# Patient Record
Sex: Female | Born: 1935
Health system: Southern US, Community
[De-identification: ages and names within clinical notes are randomized; demographics above are authoritative.]

## PROBLEM LIST (undated history)

## (undated) DIAGNOSIS — H3530 Unspecified macular degeneration: Secondary | ICD-10-CM

## (undated) DIAGNOSIS — Z8489 Family history of other specified conditions: Secondary | ICD-10-CM

## (undated) DIAGNOSIS — Z9109 Other allergy status, other than to drugs and biological substances: Secondary | ICD-10-CM

## (undated) DIAGNOSIS — C4490 Unspecified malignant neoplasm of skin, unspecified: Secondary | ICD-10-CM

## (undated) DIAGNOSIS — R32 Unspecified urinary incontinence: Secondary | ICD-10-CM

## (undated) DIAGNOSIS — I2510 Atherosclerotic heart disease of native coronary artery without angina pectoris: Secondary | ICD-10-CM

## (undated) DIAGNOSIS — H269 Unspecified cataract: Secondary | ICD-10-CM

## (undated) DIAGNOSIS — Z6741 Type O blood, Rh negative: Secondary | ICD-10-CM

## (undated) DIAGNOSIS — R011 Cardiac murmur, unspecified: Secondary | ICD-10-CM

## (undated) DIAGNOSIS — I10 Essential (primary) hypertension: Secondary | ICD-10-CM

## (undated) DIAGNOSIS — Z8619 Personal history of other infectious and parasitic diseases: Secondary | ICD-10-CM

## (undated) DIAGNOSIS — K649 Unspecified hemorrhoids: Secondary | ICD-10-CM

## (undated) DIAGNOSIS — K219 Gastro-esophageal reflux disease without esophagitis: Secondary | ICD-10-CM

## (undated) DIAGNOSIS — M1990 Unspecified osteoarthritis, unspecified site: Secondary | ICD-10-CM

## (undated) HISTORY — PX: CHOLECYSTECTOMY, LAPAROSCOPIC: SHX56

## (undated) HISTORY — PX: SKIN CANCER EXCISION: SHX779

## (undated) HISTORY — PX: TONSILLECTOMY: SUR1361

## (undated) HISTORY — PX: ABDOMINAL HYSTERECTOMY: SHX81

## (undated) HISTORY — PX: PARS PLANA VITRECTOMY W/ REPAIR OF MACULAR HOLE: SHX2170

## (undated) HISTORY — PX: MOHS SURGERY: SUR867

---
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
## 2005-02-26 ENCOUNTER — Ambulatory Visit: Payer: Self-pay | Admitting: Unknown Physician Specialty

## 2006-03-12 ENCOUNTER — Ambulatory Visit: Payer: Self-pay | Admitting: Unknown Physician Specialty

## 2007-04-19 ENCOUNTER — Ambulatory Visit: Payer: Self-pay | Admitting: Unknown Physician Specialty

## 2007-06-16 HISTORY — PX: OTHER SURGICAL HISTORY: SHX169

## 2007-06-16 HISTORY — PX: EYE SURGERY: SHX253

## 2007-07-26 ENCOUNTER — Ambulatory Visit: Payer: Self-pay | Admitting: Gastroenterology

## 2008-05-23 ENCOUNTER — Ambulatory Visit: Payer: Self-pay | Admitting: Family Medicine

## 2008-06-15 HISTORY — PX: EYE SURGERY: SHX253

## 2009-04-15 ENCOUNTER — Ambulatory Visit (HOSPITAL_COMMUNITY): Admission: RE | Admit: 2009-04-15 | Discharge: 2009-04-15 | Payer: Self-pay | Admitting: Ophthalmology

## 2009-06-15 HISTORY — PX: EYE SURGERY: SHX253

## 2009-08-08 ENCOUNTER — Ambulatory Visit: Payer: Self-pay | Admitting: Family Medicine

## 2010-09-17 LAB — COMPREHENSIVE METABOLIC PANEL
ALT: 23 U/L (ref 0–35)
AST: 22 U/L (ref 0–37)
Albumin: 4.5 g/dL (ref 3.5–5.2)
Alkaline Phosphatase: 58 U/L (ref 39–117)
BUN: 16 mg/dL (ref 6–23)
CO2: 26 mEq/L (ref 19–32)
Calcium: 9.6 mg/dL (ref 8.4–10.5)
Chloride: 102 mEq/L (ref 96–112)
Creatinine, Ser: 0.85 mg/dL (ref 0.4–1.2)
GFR calc Af Amer: >60 mL/min (ref >60)
GFR calc non Af Amer: >60 mL/min (ref >60)
Glucose, Bld: 100 mg/dL — ABNORMAL HIGH (ref 70–99)
Potassium: 3.4 mEq/L — ABNORMAL LOW (ref 3.5–5.1)
Sodium: 138 mEq/L (ref 135–145)
Total Bilirubin: 0.4 mg/dL (ref 0.3–1.2)
Total Protein: 8.2 g/dL (ref 6.0–8.3)

## 2010-09-17 LAB — URINALYSIS, ROUTINE W REFLEX MICROSCOPIC
Bilirubin Urine: NEGATIVE
Glucose, UA: NEGATIVE mg/dL
Hgb urine dipstick: NEGATIVE
Ketones, ur: NEGATIVE mg/dL
Nitrite: NEGATIVE
Protein, ur: NEGATIVE mg/dL
Specific Gravity, Urine: 1.015 (ref 1.005–1.030)
Urobilinogen, UA: 0.2 mg/dL (ref 0.0–1.0)
pH: 7.5 (ref 5.0–8.0)

## 2010-09-17 LAB — CBC
HCT: 43 % (ref 36.0–46.0)
Hemoglobin: 14.8 g/dL (ref 12.0–15.0)
MCHC: 34.5 g/dL (ref 30.0–36.0)
MCV: 90.8 fL (ref 78.0–100.0)
Platelets: 332 10*3/uL (ref 150–400)
RBC: 4.73 MIL/uL (ref 3.87–5.11)
RDW: 13.9 % (ref 11.5–15.5)
WBC: 8.6 10*3/uL (ref 4.0–10.5)

## 2010-09-17 LAB — AUTOLOGOUS SERUM PATCH PREP

## 2010-09-18 ENCOUNTER — Ambulatory Visit: Payer: Self-pay | Admitting: Family Medicine

## 2010-11-24 LAB — HM COLONOSCOPY

## 2011-08-14 HISTORY — PX: MOHS SURGERY: SUR867

## 2011-11-18 LAB — HM PAP SMEAR: HM Pap smear: NEGATIVE

## 2011-12-01 ENCOUNTER — Ambulatory Visit: Payer: Self-pay | Admitting: Family Medicine

## 2012-03-24 ENCOUNTER — Ambulatory Visit: Payer: Self-pay | Admitting: Sports Medicine

## 2012-05-18 ENCOUNTER — Ambulatory Visit: Payer: Self-pay | Admitting: Unknown Physician Specialty

## 2012-06-01 HISTORY — PX: JOINT REPLACEMENT: SHX530

## 2013-01-03 ENCOUNTER — Ambulatory Visit: Payer: Self-pay | Admitting: Family Medicine

## 2013-01-17 ENCOUNTER — Ambulatory Visit: Payer: Self-pay | Admitting: Family Medicine

## 2014-01-16 LAB — HM DEXA SCAN

## 2014-01-17 LAB — HM MAMMOGRAPHY

## 2014-02-23 ENCOUNTER — Ambulatory Visit: Payer: Self-pay | Admitting: Family Medicine

## 2014-07-10 ENCOUNTER — Ambulatory Visit: Payer: Self-pay | Admitting: Family Medicine

## 2014-10-24 LAB — BASIC METABOLIC PANEL
BUN: 16 mg/dL (ref 4–21)
Creatinine: 0.9 mg/dL (ref 0.5–1.1)
Glucose: 90 mg/dL
Potassium: 4.5 mmol/L (ref 3.4–5.3)
Sodium: 141 mmol/L (ref 137–147)

## 2014-10-24 LAB — LIPID PANEL
Cholesterol: 157 mg/dL (ref 0–200)
HDL: 73 mg/dL — AB (ref 35–70)
LDL Cholesterol: 56 mg/dL
LDl/HDL Ratio: 0.8
Triglycerides: 139 mg/dL (ref 40–160)

## 2014-10-24 LAB — CBC AND DIFFERENTIAL
HCT: 38 % (ref 36–46)
Hemoglobin: 12.7 g/dL (ref 12.0–16.0)
Neutrophils Absolute: 5 /uL
Platelets: 367 10*3/uL (ref 150–399)
WBC: 8 10^3/mL

## 2014-10-24 LAB — HEPATIC FUNCTION PANEL
ALT: 14 U/L (ref 7–35)
AST: 15 U/L (ref 13–35)
Alkaline Phosphatase: 65 U/L (ref 25–125)
Bilirubin, Total: 0.4 mg/dL

## 2014-10-24 LAB — TSH: TSH: 2.89 u[IU]/mL (ref 0.41–5.90)

## 2014-11-15 ENCOUNTER — Telehealth: Payer: Self-pay | Admitting: Family Medicine

## 2014-11-15 NOTE — Telephone Encounter (Signed)
Spoke with patient and [facility], patient received a letter from insurance stating Vesicare needs a letter of necessity. We did not get a copy and pharmacy could not tell me exactly what is needed. Pt has been on this medication for years no other medications have been tried. Patient will call the insurance and get them to send [place] a note about this and what is needed.

## 2014-11-15 NOTE — Telephone Encounter (Signed)
Pt stated she received a letter from the insurance about Vesicare and wanted to speak with someone about our office responding back to the insurance. Thanks TNP

## 2014-11-15 NOTE — Telephone Encounter (Signed)
[id number] 

## 2014-11-20 ENCOUNTER — Telehealth: Payer: Self-pay

## 2014-11-20 DIAGNOSIS — N3281 Overactive bladder: Secondary | ICD-10-CM

## 2014-11-20 MED ORDER — OXYBUTYNIN CHLORIDE 5 MG PO TABS: 5.0000 mg | ORAL_TABLET | Freq: Three times a day (TID) | ORAL | Status: DC | PRN

## 2014-11-20 NOTE — Telephone Encounter (Signed)
Due to insurance switching from vesicare to Oxybutinin. Patient advised and she will start new medication after finishing Vesicare. Has a CPE appt in August. Patient advised to let us know if she is having trouble with the medication. Letter from insurance scanned in the chart.

## 2014-11-21 ENCOUNTER — Telehealth: Payer: Self-pay | Admitting: Family Medicine

## 2014-11-21 NOTE — Telephone Encounter (Signed)
Brook at [facility] advised that yes patient is going to try Oxybutinin due to insurance. May need to go back to Vesicare if sx are not controlled by other medications.

## 2014-11-21 NOTE — Telephone Encounter (Signed)
Brook with [facility] called to verify pt is to start Oxybutynin and stop Vesicare?  CB#[phone]

## 2014-12-18 ENCOUNTER — Ambulatory Visit (INDEPENDENT_AMBULATORY_CARE_PROVIDER_SITE_OTHER): Payer: BLUE CROSS/BLUE SHIELD | Admitting: Family Medicine

## 2014-12-18 ENCOUNTER — Ambulatory Visit
Admission: RE | Admit: 2014-12-18 | Discharge: 2014-12-18 | Disposition: A | Discharge status: Home / Self Care | Payer: BLUE CROSS/BLUE SHIELD | Source: Ambulatory Visit | Attending: Family Medicine | Admitting: Family Medicine

## 2014-12-18 ENCOUNTER — Encounter: Payer: Self-pay | Admitting: Emergency Medicine

## 2014-12-18 ENCOUNTER — Encounter: Payer: Self-pay | Admitting: Family Medicine

## 2014-12-18 VITALS — BP 134/70 | HR 64 | Temp 99.1°F | Resp 16 | Ht 63.5 in

## 2014-12-18 DIAGNOSIS — E785 Hyperlipidemia, unspecified: Secondary | ICD-10-CM | POA: Insufficient documentation

## 2014-12-18 DIAGNOSIS — I350 Nonrheumatic aortic (valve) stenosis: Secondary | ICD-10-CM | POA: Insufficient documentation

## 2014-12-18 DIAGNOSIS — R059 Cough, unspecified: Secondary | ICD-10-CM

## 2014-12-18 DIAGNOSIS — N3281 Overactive bladder: Secondary | ICD-10-CM | POA: Insufficient documentation

## 2014-12-18 DIAGNOSIS — J309 Allergic rhinitis, unspecified: Secondary | ICD-10-CM | POA: Insufficient documentation

## 2014-12-18 DIAGNOSIS — R05 Cough: Secondary | ICD-10-CM

## 2014-12-18 DIAGNOSIS — R011 Cardiac murmur, unspecified: Secondary | ICD-10-CM | POA: Insufficient documentation

## 2014-12-18 DIAGNOSIS — L570 Actinic keratosis: Secondary | ICD-10-CM | POA: Insufficient documentation

## 2014-12-18 DIAGNOSIS — N318 Other neuromuscular dysfunction of bladder: Secondary | ICD-10-CM | POA: Insufficient documentation

## 2014-12-18 DIAGNOSIS — I8390 Asymptomatic varicose veins of unspecified lower extremity: Secondary | ICD-10-CM | POA: Insufficient documentation

## 2014-12-18 DIAGNOSIS — I10 Essential (primary) hypertension: Secondary | ICD-10-CM | POA: Insufficient documentation

## 2014-12-18 DIAGNOSIS — C4491 Basal cell carcinoma of skin, unspecified: Secondary | ICD-10-CM | POA: Insufficient documentation

## 2014-12-18 DIAGNOSIS — M25569 Pain in unspecified knee: Secondary | ICD-10-CM | POA: Insufficient documentation

## 2014-12-18 DIAGNOSIS — K219 Gastro-esophageal reflux disease without esophagitis: Secondary | ICD-10-CM | POA: Insufficient documentation

## 2014-12-18 DIAGNOSIS — M9979 Connective tissue and disc stenosis of intervertebral foramina of abdomen and other regions: Secondary | ICD-10-CM | POA: Insufficient documentation

## 2014-12-18 DIAGNOSIS — I38 Endocarditis, valve unspecified: Secondary | ICD-10-CM | POA: Insufficient documentation

## 2014-12-18 DIAGNOSIS — M1990 Unspecified osteoarthritis, unspecified site: Secondary | ICD-10-CM | POA: Insufficient documentation

## 2014-12-18 MED ORDER — AZITHROMYCIN 250 MG PO TABS: ORAL_TABLET | ORAL | Status: DC

## 2014-12-18 MED ORDER — SOLIFENACIN SUCCINATE 5 MG PO TABS: 5.0000 mg | ORAL_TABLET | Freq: Every day | ORAL | Status: DC

## 2014-12-18 NOTE — Progress Notes (Signed)
Patient ID: Teresa Moyer, female   DOB: 1936/06/14, 79 y.o.   MRN: [id number]   Teresa Moyer  MRN: [id number] DOB: 04-Sep-1935  Subjective:  Cough This is a recurrent problem. The current episode started 1 to 4 weeks ago. The problem has been waxing and waning. The problem occurs constantly. The cough is non-productive. Associated symptoms include a fever, nasal congestion, postnasal drip and a sore throat. Pertinent negatives include no chills, ear pain, headaches, shortness of breath or wheezing. Nothing aggravates the symptoms. She has tried OTC cough suppressant for the symptoms. The treatment provided mild relief. Her past medical history is significant for environmental allergies.  Patient reports that she has had cough for about 3 weeks. She reports that she has tried OTC delsym which has helped a little. Patient reports that she has also had a low grade fever. She mentions that she takes a daily allergy tablet (Zyrtec) to control allergy symptoms. However, it doesn't seem to be helping over the past few weeks.   Regarding her OAB, the oxybutynin does not work nearly as well as the [employer]. It is less effective, the patient leaks a lot of urine, and she has a significant dry mouth.  Patient Active Problem List   Diagnosis Date Noted  . Actinic keratosis 12/18/2014  . Allergic rhinitis 12/18/2014  . Arthritis 12/18/2014  . Basal cell carcinoma of skin 12/18/2014  . Gonalgia 12/18/2014  . Narrowing of intervertebral disc space 12/18/2014  . Essential (primary) hypertension 12/18/2014  . Gastro-esophageal reflux disease without esophagitis 12/18/2014  . HLD (hyperlipidemia) 12/18/2014  . Cardiac murmur 12/18/2014  . Arthritis, degenerative 12/18/2014  . Hypertonicity of bladder 12/18/2014  . Detrusor muscle hypertonia 12/18/2014  . Heart valve disease 12/18/2014  . Asymptomatic varicose veins 12/18/2014    History reviewed. No pertinent past medical history.  History    Social History  . Marital Status: Married    Spouse Name: N/A  . Number of Children: N/A  . Years of Education: N/A   Occupational History  . Not on file.   Social History Main Topics  . Smoking status: Never Smoker   . Smokeless tobacco: Never Used  . Alcohol Use: Not on file  . Drug Use: Not on file  . Sexual Activity: Not on file   Other Topics Concern  . Not on file   Social History Narrative    Outpatient Prescriptions Prior to Visit  Medication Sig Dispense Refill  . amLODipine (NORVASC) 5 MG tablet Take by mouth.    Marland Kitchen azelastine (ASTELIN) 0.1 % nasal spray Place into the nose.    . Calcium Carbonate 1500 (600 CA) MG TABS Take by mouth.    . cetirizine (ZYRTEC ALLERGY) 10 MG tablet Take by mouth.    . fenofibrate 160 MG tablet Take by mouth.    . fluticasone (FLONASE) 50 MCG/ACT nasal spray Place into the nose.    . furosemide (LASIX) 20 MG tablet Take by mouth.    . losartan (COZAAR) 100 MG tablet Take by mouth.    . meloxicam (MOBIC) 7.5 MG tablet Take by mouth.    . nebivolol (BYSTOLIC) 10 MG tablet Take by mouth.    . Omeprazole 20 MG TBEC Take by mouth.    . oxybutynin (DITROPAN) 5 MG tablet Take 1 tablet (5 mg total) by mouth 3 (three) times daily as needed for bladder spasms. 90 tablet 12  . Probiotic Product (ALIGN) 4 MG CAPS Take by mouth.    Marland Kitchen  traMADol (ULTRAM) 50 MG tablet Take by mouth.     No facility-administered medications prior to visit.    Allergies  Allergen Reactions  . Amoxicillin   . Accupril  [Quinapril Hcl] Hives and Swelling  . Codeine Sulfate   . Clinoril  [Sulindac]   . Hydrochlorothiazide   . Sulfa Antibiotics   . Tessalon  [Benzonatate]     Review of Systems  Constitutional: Positive for fever. Negative for chills.  HENT: Positive for postnasal drip and sore throat. Negative for ear pain.   Eyes: Negative.   Respiratory: Positive for cough. Negative for shortness of breath and wheezing.   Cardiovascular: Negative.    Gastrointestinal: Negative.   Genitourinary:       See history of present illness  Skin: Negative.   Neurological: Negative for headaches.  Endo/Heme/Allergies: Positive for environmental allergies.  Psychiatric/Behavioral: Negative.    Objective:  BP 134/70 mmHg  Pulse 64  Temp(Src) 99.1 F (37.3 C)  Resp 16  Ht 5' 3.5" (1.613 m)  Wt   SpO2 96%  Physical Exam  Constitutional: She is oriented to person, place, and time and well-developed, well-nourished, and in no distress.  HENT:  Head: Normocephalic and atraumatic.  Right Ear: External ear normal.  Left Ear: External ear normal.  Nose: Nose normal.  Mouth/Throat: Oropharynx is clear and moist.  Eyes: Conjunctivae and EOM are normal. Pupils are equal, round, and reactive to light.  Neck: Normal range of motion. Neck supple.  Cardiovascular: Normal rate, regular rhythm, normal heart sounds and intact distal pulses.   Pulmonary/Chest: Effort normal and breath sounds normal. No respiratory distress. She has no wheezes.  Abdominal: Soft. Bowel sounds are normal.  Neurological: She is alert and oriented to person, place, and time.  Skin: Skin is warm and dry.  Psychiatric: Memory, affect and judgment normal.  Vitals reviewed.   Assessment and Plan :  No diagnosis found. Cough/chronic bronchitis Obtain chest x-ray and a patient request will treat with Z-Pak.  OAB/overactive bladder DC oxybutynin and try Vesicare 5 mg daily. this should work for the patient. It worked well in the past. I have done the exam and reviewed the above chart and it is accurate to the best of my knowledge.  Miguel Aschoff MD [facility] 12/18/2014 3:19 PM

## 2015-01-16 ENCOUNTER — Encounter: Payer: Self-pay | Admitting: Family Medicine

## 2015-02-14 ENCOUNTER — Other Ambulatory Visit: Payer: Self-pay | Admitting: Family Medicine

## 2015-02-14 ENCOUNTER — Encounter: Payer: Self-pay | Admitting: Family Medicine

## 2015-02-14 ENCOUNTER — Ambulatory Visit (INDEPENDENT_AMBULATORY_CARE_PROVIDER_SITE_OTHER): Payer: BLUE CROSS/BLUE SHIELD | Admitting: Family Medicine

## 2015-02-14 VITALS — BP 142/70 | HR 64 | Temp 97.6°F | Resp 16 | Ht 63.0 in | Wt 150.0 lb

## 2015-02-14 DIAGNOSIS — Z0000 Encounter for general adult medical examination without abnormal findings: Secondary | ICD-10-CM

## 2015-02-14 DIAGNOSIS — Z1239 Encounter for other screening for malignant neoplasm of breast: Secondary | ICD-10-CM

## 2015-02-14 DIAGNOSIS — Z1231 Encounter for screening mammogram for malignant neoplasm of breast: Secondary | ICD-10-CM
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
## 2015-02-25 ENCOUNTER — Other Ambulatory Visit: Payer: Self-pay | Admitting: Family Medicine

## 2015-02-26 ENCOUNTER — Ambulatory Visit: Payer: BLUE CROSS/BLUE SHIELD

## 2015-02-28 ENCOUNTER — Ambulatory Visit
Admission: RE | Admit: 2015-02-28 | Discharge: 2015-02-28 | Disposition: A | Discharge status: Home / Self Care | Payer: BLUE CROSS/BLUE SHIELD | Source: Ambulatory Visit | Attending: Family Medicine | Admitting: Family Medicine

## 2015-02-28 DIAGNOSIS — Z1231 Encounter for screening mammogram for malignant neoplasm of breast: Secondary | ICD-10-CM | POA: Diagnosis not present

## 2015-03-22 ENCOUNTER — Other Ambulatory Visit: Payer: Self-pay | Admitting: Family Medicine

## 2015-03-25 ENCOUNTER — Other Ambulatory Visit: Payer: Self-pay | Admitting: Family Medicine

## 2015-04-12 ENCOUNTER — Other Ambulatory Visit: Payer: Self-pay | Admitting: Family Medicine

## 2015-05-27 ENCOUNTER — Other Ambulatory Visit: Payer: Self-pay | Admitting: Family Medicine

## 2015-08-02 DIAGNOSIS — Z85828 Personal history of other malignant neoplasm of skin: Secondary | ICD-10-CM | POA: Diagnosis not present

## 2015-08-02 DIAGNOSIS — D485 Neoplasm of uncertain behavior of skin: Secondary | ICD-10-CM | POA: Diagnosis not present

## 2015-08-02 DIAGNOSIS — L814 Other melanin hyperpigmentation: Secondary | ICD-10-CM | POA: Diagnosis not present

## 2015-08-02 DIAGNOSIS — D2272 Melanocytic nevi of left lower limb, including hip: Secondary | ICD-10-CM | POA: Diagnosis not present

## 2015-08-02 DIAGNOSIS — D2271 Melanocytic nevi of right lower limb, including hip: Secondary | ICD-10-CM | POA: Diagnosis not present

## 2015-08-02 DIAGNOSIS — X32XXXA Exposure to sunlight, initial encounter: Secondary | ICD-10-CM | POA: Diagnosis not present

## 2015-08-02 DIAGNOSIS — D225 Melanocytic nevi of trunk: Secondary | ICD-10-CM | POA: Diagnosis not present

## 2015-08-02 DIAGNOSIS — L570 Actinic keratosis: Secondary | ICD-10-CM | POA: Diagnosis not present

## 2015-08-07 DIAGNOSIS — I10 Essential (primary) hypertension: Secondary | ICD-10-CM | POA: Diagnosis not present

## 2015-08-07 DIAGNOSIS — I350 Nonrheumatic aortic (valve) stenosis: Secondary | ICD-10-CM | POA: Diagnosis not present

## 2015-08-07 DIAGNOSIS — R072 Precordial pain: Secondary | ICD-10-CM | POA: Diagnosis not present

## 2015-08-07 DIAGNOSIS — E782 Mixed hyperlipidemia: Secondary | ICD-10-CM | POA: Diagnosis not present

## 2015-08-14 ENCOUNTER — Ambulatory Visit (INDEPENDENT_AMBULATORY_CARE_PROVIDER_SITE_OTHER): Payer: Medicare Other | Admitting: Family Medicine

## 2015-08-14 ENCOUNTER — Encounter: Payer: Self-pay | Admitting: Family Medicine

## 2015-08-14 VITALS — BP 140/60 | HR 70 | Temp 98.3°F | Resp 16 | Wt 150.6 lb

## 2015-08-14 DIAGNOSIS — K625 Hemorrhage of anus and rectum: Secondary | ICD-10-CM

## 2015-08-14 DIAGNOSIS — K219 Gastro-esophageal reflux disease without esophagitis: Secondary | ICD-10-CM | POA: Diagnosis not present

## 2015-08-14 DIAGNOSIS — M159 Polyosteoarthritis, unspecified: Secondary | ICD-10-CM

## 2015-08-14 DIAGNOSIS — I10 Essential (primary) hypertension: Secondary | ICD-10-CM

## 2015-08-14 DIAGNOSIS — E785 Hyperlipidemia, unspecified: Secondary | ICD-10-CM

## 2015-08-14 MED ORDER — GLUCOSAMINE CHOND COMPLEX/MSM PO TABS: ORAL_TABLET | ORAL | Status: DC
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
## 2015-08-15 ENCOUNTER — Telehealth: Payer: Self-pay | Admitting: Family Medicine

## 2015-08-15 ENCOUNTER — Other Ambulatory Visit: Payer: Self-pay

## 2015-08-15 NOTE — Telephone Encounter (Signed)
Pt would like a nurse to return her call. Pt stated she was advised to stop taking meloxicam (MOBIC) 7.5 MG tablet and try an OTC medication for a month to see how it does. Pt stated that it will take the other medication 2 weeks to get in her system she would like to know if she can take Aleve or Tylenol. Thanks TNP

## 2015-08-15 NOTE — Telephone Encounter (Signed)
No. Tylenol only. Okay to try topical BenGay, icy hot, etc.

## 2015-08-15 NOTE — Telephone Encounter (Signed)
Please review-aa 

## 2015-08-15 NOTE — Telephone Encounter (Signed)
Pt advised-aa 

## 2015-08-15 NOTE — Telephone Encounter (Signed)
Per dr. Rosanna Randy he misunderstood earlier, Dr. Rosanna Randy said to take tylenol or advil is fine-aa

## 2015-08-16 DIAGNOSIS — K219 Gastro-esophageal reflux disease without esophagitis: Secondary | ICD-10-CM | POA: Diagnosis not present

## 2015-08-16 DIAGNOSIS — E785 Hyperlipidemia, unspecified: Secondary | ICD-10-CM | POA: Diagnosis not present

## 2015-08-16 DIAGNOSIS — I10 Essential (primary) hypertension: Secondary | ICD-10-CM | POA: Diagnosis not present

## 2015-08-17 LAB — COMPREHENSIVE METABOLIC PANEL
ALT: 13 IU/L (ref 0–32)
AST: 14 IU/L (ref 0–40)
Albumin/Globulin Ratio: 1.7 (ref 1.1–2.5)
Albumin: 4.3 g/dL (ref 3.5–4.8)
Alkaline Phosphatase: 63 IU/L (ref 39–117)
BUN/Creatinine Ratio: 21 (ref 11–26)
BUN: 18 mg/dL (ref 8–27)
Bilirubin Total: 0.4 mg/dL (ref 0.0–1.2)
CO2: 25 mmol/L (ref 18–29)
Calcium: 10.1 mg/dL (ref 8.7–10.3)
Chloride: 103 mmol/L (ref 96–106)
Creatinine, Ser: 0.85 mg/dL (ref 0.57–1.00)
GFR calc Af Amer: 76 mL/min/{1.73_m2} (ref >59)
GFR calc non Af Amer: 66 mL/min/{1.73_m2} (ref >59)
Globulin, Total: 2.6 g/dL (ref 1.5–4.5)
Glucose: 90 mg/dL (ref 65–99)
Potassium: 5 mmol/L (ref 3.5–5.2)
Sodium: 144 mmol/L (ref 134–144)
Total Protein: 6.9 g/dL (ref 6.0–8.5)

## 2015-08-17 LAB — CBC WITH DIFFERENTIAL/PLATELET
Basophils Absolute: 0.1 10*3/uL (ref 0.0–0.2)
Basos: 1 %
EOS (ABSOLUTE): 0.2 10*3/uL (ref 0.0–0.4)
Eos: 3 %
Hematocrit: 39.1 % (ref 34.0–46.6)
Hemoglobin: 13.1 g/dL (ref 11.1–15.9)
Immature Grans (Abs): 0 10*3/uL (ref 0.0–0.1)
Immature Granulocytes: 0 %
Lymphocytes Absolute: 1.9 10*3/uL (ref 0.7–3.1)
Lymphs: 27 %
MCH: 28.9 pg (ref 26.6–33.0)
MCHC: 33.5 g/dL (ref 31.5–35.7)
MCV: 86 fL (ref 79–97)
Monocytes Absolute: 0.9 10*3/uL (ref 0.1–0.9)
Monocytes: 13 %
Neutrophils Absolute: 3.9 10*3/uL (ref 1.4–7.0)
Neutrophils: 56 %
Platelets: 338 10*3/uL (ref 150–379)
RBC: 4.53 x10E6/uL (ref 3.77–5.28)
RDW: 14.1 % (ref 12.3–15.4)
WBC: 7 10*3/uL (ref 3.4–10.8)

## 2015-08-17 LAB — LIPID PANEL WITH LDL/HDL RATIO
Cholesterol, Total: 161 mg/dL (ref 100–199)
HDL: 71 mg/dL (ref >39)
LDL Calculated: 58 mg/dL (ref 0–99)
LDl/HDL Ratio: 0.8 ratio units (ref 0.0–3.2)
Triglycerides: 161 mg/dL — ABNORMAL HIGH (ref 0–149)
VLDL Cholesterol Cal: 32 mg/dL (ref 5–40)

## 2015-08-17 LAB — TSH: TSH: 3.27 u[IU]/mL (ref 0.450–4.500)

## 2015-08-19 ENCOUNTER — Other Ambulatory Visit: Payer: Self-pay | Admitting: Family Medicine

## 2015-08-19 DIAGNOSIS — Z86010 Personal history of colonic polyps: Secondary | ICD-10-CM | POA: Diagnosis not present

## 2015-08-29 ENCOUNTER — Encounter: Payer: Self-pay | Admitting: *Deleted

## 2015-08-30 ENCOUNTER — Encounter
Admission: RE | Disposition: A | Discharge status: Home / Self Care | Payer: Self-pay | Source: Ambulatory Visit | Attending: Gastroenterology

## 2015-08-30 ENCOUNTER — Ambulatory Visit
Admission: RE | Admit: 2015-08-30 | Discharge: 2015-08-30 | Disposition: A | Discharge status: Home / Self Care | Payer: Medicare Other | Source: Ambulatory Visit | Attending: Gastroenterology | Admitting: Gastroenterology

## 2015-08-30 ENCOUNTER — Ambulatory Visit: Payer: Medicare Other | Admitting: Anesthesiology

## 2015-08-30 ENCOUNTER — Encounter: Payer: Self-pay | Admitting: *Deleted

## 2015-08-30 DIAGNOSIS — Z880 Allergy status to penicillin: Secondary | ICD-10-CM | POA: Diagnosis not present

## 2015-08-30 DIAGNOSIS — K640 First degree hemorrhoids: Secondary | ICD-10-CM | POA: Diagnosis not present

## 2015-08-30 DIAGNOSIS — K621 Rectal polyp: Secondary | ICD-10-CM | POA: Insufficient documentation

## 2015-08-30 DIAGNOSIS — Z885 Allergy status to narcotic agent status: Secondary | ICD-10-CM | POA: Diagnosis not present

## 2015-08-30 DIAGNOSIS — Z1211 Encounter for screening for malignant neoplasm of colon: Secondary | ICD-10-CM | POA: Insufficient documentation

## 2015-08-30 DIAGNOSIS — D123 Benign neoplasm of transverse colon: Secondary | ICD-10-CM | POA: Insufficient documentation

## 2015-08-30 DIAGNOSIS — R011 Cardiac murmur, unspecified: Secondary | ICD-10-CM | POA: Insufficient documentation

## 2015-08-30 DIAGNOSIS — K219 Gastro-esophageal reflux disease without esophagitis: Secondary | ICD-10-CM | POA: Diagnosis not present

## 2015-08-30 DIAGNOSIS — M1990 Unspecified osteoarthritis, unspecified site: Secondary | ICD-10-CM | POA: Insufficient documentation

## 2015-08-30 DIAGNOSIS — D127 Benign neoplasm of rectosigmoid junction: Secondary | ICD-10-CM | POA: Diagnosis not present

## 2015-08-30 DIAGNOSIS — E782 Mixed hyperlipidemia: Secondary | ICD-10-CM | POA: Diagnosis not present

## 2015-08-30 DIAGNOSIS — Z96651 Presence of right artificial knee joint: Secondary | ICD-10-CM | POA: Insufficient documentation

## 2015-08-30 DIAGNOSIS — Z90710 Acquired absence of both cervix and uterus: Secondary | ICD-10-CM | POA: Diagnosis not present

## 2015-08-30 DIAGNOSIS — K649 Unspecified hemorrhoids: Secondary | ICD-10-CM | POA: Diagnosis not present

## 2015-08-30 DIAGNOSIS — Z85828 Personal history of other malignant neoplasm of skin: Secondary | ICD-10-CM | POA: Insufficient documentation

## 2015-08-30 DIAGNOSIS — Z79899 Other long term (current) drug therapy: Secondary | ICD-10-CM | POA: Insufficient documentation

## 2015-08-30 DIAGNOSIS — Z888 Allergy status to other drugs, medicaments and biological substances status: Secondary | ICD-10-CM | POA: Insufficient documentation

## 2015-08-30 DIAGNOSIS — Z882 Allergy status to sulfonamides status: Secondary | ICD-10-CM | POA: Insufficient documentation

## 2015-08-30 DIAGNOSIS — Z86010 Personal history of colonic polyps: Secondary | ICD-10-CM | POA: Diagnosis not present

## 2015-08-30 DIAGNOSIS — I10 Essential (primary) hypertension: Secondary | ICD-10-CM | POA: Insufficient documentation

## 2015-08-30 DIAGNOSIS — K635 Polyp of colon: Secondary | ICD-10-CM | POA: Diagnosis not present

## 2015-08-30 DIAGNOSIS — D122 Benign neoplasm of ascending colon: Secondary | ICD-10-CM | POA: Diagnosis not present

## 2015-08-30 HISTORY — DX: Personal history of other infectious and parasitic diseases: Z86.19

## 2015-08-30 HISTORY — PX: COLONOSCOPY WITH PROPOFOL: SHX5780

## 2015-08-30 HISTORY — DX: Unspecified cataract: H26.9

## 2015-08-30 HISTORY — DX: Unspecified hemorrhoids: K64.9

## 2015-08-30 HISTORY — DX: Unspecified urinary incontinence: R32

## 2015-08-30 HISTORY — DX: Unspecified malignant neoplasm of skin, unspecified: C44.90

## 2015-08-30 HISTORY — DX: Unspecified osteoarthritis, unspecified site: M19.90

## 2015-08-30 HISTORY — DX: Essential (primary) hypertension: I10

## 2015-08-30 HISTORY — DX: Other allergy status, other than to drugs and biological substances: Z91.09

## 2015-08-30 HISTORY — DX: Cardiac murmur, unspecified: R01.1

## 2015-08-30 LAB — HM COLONOSCOPY

## 2015-08-30 LAB — SURGICAL PATHOLOGY

## 2015-08-30 SURGERY — COLONOSCOPY WITH PROPOFOL
Anesthesia: General

## 2015-08-30 MED ORDER — SODIUM CHLORIDE 0.9 % IV SOLN
INTRAVENOUS | Status: DC
  Administered 2015-08-30: 1000 mL via INTRAVENOUS
  Administered 2015-08-30: 11:00:00 via INTRAVENOUS

## 2015-08-30 MED ORDER — PROPOFOL 500 MG/50ML IV EMUL
INTRAVENOUS | Status: DC | PRN
  Administered 2015-08-30: 50 ug/kg/min via INTRAVENOUS

## 2015-08-30 NOTE — Op Note (Signed)
[facility] Gastroenterology Patient Name: Teresa Moyer Procedure Date: 08/30/2015 11:00 AM MRN: [id number] Account #: [id number] Date of Birth: 01/10/1936 Admit Type: Outpatient Age: 80 Room: [facility] ENDO ROOM 2 Gender: Female Note Status: Finalized Procedure:            Colonoscopy Indications:          High risk colon cancer surveillance: Personal history                        of colonic polyps, Last colonoscopy: 2012 Patient Profile:      This is a 80 year old female. Providers:            Gerrit Heck. Rayann Heman, MD Referring MD:         Janine Ores. Rosanna Randy, MD (Referring MD) Medicines:            Propofol per Anesthesia Complications:        No immediate complications. Procedure:            Pre-Anesthesia Assessment:                       - Prior to the procedure, a History and Physical was                        performed, and patient medications, allergies and                        sensitivities were reviewed. The patient's tolerance of                        previous anesthesia was reviewed.                       After obtaining informed consent, the colonoscope was                        passed under direct vision. Throughout the procedure,                        the patient's blood pressure, pulse, and oxygen                        saturations were monitored continuously. The                        Colonoscope was introduced through the anus and                        advanced to the the cecum, identified by appendiceal                        orifice and ileocecal valve. The colonoscopy was                        performed without difficulty. The patient tolerated the                        procedure well. The quality of the bowel preparation                        was excellent. Findings:  The perianal and digital rectal examinations were normal.      A 3 mm polyp was found in the hepatic flexure. The polyp was sessile.       The polyp was removed with a  jumbo cold forceps. Resection and retrieval       were complete.      A 5 mm polyp was found in the recto-sigmoid colon. The polyp was       sessile. The polyp was removed with a cold snare. Resection and       retrieval were complete.      Internal hemorrhoids were found during retroflexion. The hemorrhoids       were Grade I (internal hemorrhoids that do not prolapse).      The exam was otherwise without abnormality. Impression:           - One 3 mm polyp at the hepatic flexure, removed with a                        jumbo cold forceps. Resected and retrieved.                       - One 5 mm polyp at the recto-sigmoid colon, removed                        with a cold snare. Resected and retrieved.                       - Internal hemorrhoids.                       - The examination was otherwise normal. Recommendation:       - Observe patient in GI recovery unit.                       - High fiber diet.                       - Continue present medications.                       - Await pathology results.                       - Return to referring physician.                       - Would not recommend further colon cancer screening                        based on age.                       - The findings and recommendations were discussed with                        the patient.                       - The findings and recommendations were discussed with                        the patient's family. Procedure Code(s):    --- Professional ---  45385, Colonoscopy, flexible; with removal of tumor(s),                        polyp(s), or other lesion(s) by snare technique                       45380, 59, Colonoscopy, flexible; with biopsy, single                        or multiple Diagnosis Code(s):    --- Professional ---                       Z86.010, Personal history of colonic polyps                       D12.3, Benign neoplasm of transverse colon (hepatic                         flexure or splenic flexure)                       D12.7, Benign neoplasm of rectosigmoid junction                       K64.0, First degree hemorrhoids CPT copyright 2016 American Medical Association. All rights reserved. The codes documented in this report are preliminary and upon coder review may  be revised to meet current compliance requirements. Mellody Life, MD 08/30/2015 11:29:24 AM This report has been signed electronically. Number of Addenda: 0 Note Initiated On: 08/30/2015 11:00 AM Scope Withdrawal Time: 0 hours 11 minutes 24 seconds  Total Procedure Duration: 0 hours 17 minutes 10 seconds       [facility]

## 2015-08-30 NOTE — Anesthesia Postprocedure Evaluation (Signed)
Anesthesia Post Note  Patient: Teresa Moyer  Procedure(s) Performed: Procedure(s) (LRB): COLONOSCOPY WITH PROPOFOL (N/A)  Patient location during evaluation: Endoscopy Anesthesia Type: General Level of consciousness: awake and alert Pain management: pain level controlled Vital Signs Assessment: post-procedure vital signs reviewed and stable Respiratory status: spontaneous breathing, nonlabored ventilation, respiratory function stable and patient connected to nasal cannula oxygen Cardiovascular status: blood pressure returned to baseline and stable Postop Assessment: no signs of nausea or vomiting Anesthetic complications: no    Last Vitals:  Filed Vitals:   08/30/15 1150 08/30/15 1200  BP: 174/72 169/64  Pulse: 64 65  Temp:    Resp: 19 25    Last Pain: There were no vitals filed for this visit.               Precious Haws Erikka Follmer
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
## 2015-08-30 NOTE — Transfer of Care (Signed)
Immediate Anesthesia Transfer of Care Note  Patient: Teresa Moyer  Procedure(s) Performed: Procedure(s): COLONOSCOPY WITH PROPOFOL (N/A)  Patient Location: PACU  Anesthesia Type:General  Level of Consciousness: awake, alert  and oriented  Airway & Oxygen Therapy: Patient Spontanous Breathing and Patient connected to nasal cannula oxygen  Post-op Assessment: Report given to RN and Post -op Vital signs reviewed and stable  Post vital signs: Reviewed and stable  Last Vitals:  Filed Vitals:   08/30/15 1026 08/30/15 1132  BP: 179/62 142/61  Pulse: 65 56  Temp: 36.9 C 36.7 C  Resp: 16 18    Complications: No apparent anesthesia complications
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
## 2015-09-04 ENCOUNTER — Encounter: Payer: Self-pay | Admitting: Gastroenterology

## 2015-09-23 ENCOUNTER — Other Ambulatory Visit: Payer: Self-pay | Admitting: Family Medicine

## 2015-10-25 ENCOUNTER — Other Ambulatory Visit: Payer: Self-pay | Admitting: Family Medicine

## 2015-10-30 DIAGNOSIS — H6123 Impacted cerumen, bilateral: Secondary | ICD-10-CM | POA: Diagnosis not present

## 2015-10-30 DIAGNOSIS — J301 Allergic rhinitis due to pollen: Secondary | ICD-10-CM | POA: Diagnosis not present

## 2015-11-05 DIAGNOSIS — H35371 Puckering of macula, right eye: Secondary | ICD-10-CM | POA: Diagnosis not present

## 2015-11-05 DIAGNOSIS — Z961 Presence of intraocular lens: Secondary | ICD-10-CM | POA: Diagnosis not present

## 2015-11-28 ENCOUNTER — Telehealth: Payer: Self-pay | Admitting: Family Medicine

## 2015-11-28 NOTE — Telephone Encounter (Signed)
Pt has been taking Glucosamine/Chondrotin as directed for about 6 weeks. She even went to taking the Mobic once a week to take less of it. She reports that she has a lot she needs to get done this summer and does not have time to be hurting. Wants to know what you want her to do about taking the Mobic. Please advise.

## 2015-11-28 NOTE — Telephone Encounter (Signed)
Pt was taking Mobic for her arthritis.  Dr. Rosanna Randy ask her to not take as much so she has been taking other medications but nothing works like the [employer].  She wants to talk to someone about taking the mobic.again for her pain.  Please advise. [phone]  Thanks, Con Memos

## 2015-11-29 NOTE — Telephone Encounter (Signed)
Pt advised-aa 

## 2015-11-29 NOTE — Telephone Encounter (Signed)
Trial Mobic  every other day to see how she does with this.

## 2015-12-26 ENCOUNTER — Telehealth: Payer: Self-pay | Admitting: Family Medicine

## 2015-12-26 NOTE — Telephone Encounter (Signed)
Pt stated that she started taking 1000 mg fish oil b/c she heard that it helps with dry eyes. Pt wanted to make sure it was ok for her to take with her other medications. Please advise. Thanks TNP

## 2015-12-26 NOTE — Telephone Encounter (Signed)
Please review-aa 

## 2015-12-26 NOTE — Telephone Encounter (Signed)
Advised patient as below.  

## 2015-12-26 NOTE — Telephone Encounter (Signed)
yes

## 2016-01-06 DIAGNOSIS — H00029 Hordeolum internum unspecified eye, unspecified eyelid: Secondary | ICD-10-CM | POA: Diagnosis not present

## 2016-02-17 ENCOUNTER — Other Ambulatory Visit: Payer: Self-pay | Admitting: Family Medicine

## 2016-02-19 ENCOUNTER — Ambulatory Visit (INDEPENDENT_AMBULATORY_CARE_PROVIDER_SITE_OTHER): Payer: Medicare Other | Admitting: Family Medicine

## 2016-02-19 ENCOUNTER — Encounter: Payer: Self-pay | Admitting: Family Medicine

## 2016-02-19 VITALS — BP 144/62 | HR 66 | Temp 97.9°F | Resp 16 | Ht 64.0 in | Wt 151.0 lb

## 2016-02-19 DIAGNOSIS — Z23 Encounter for immunization: Secondary | ICD-10-CM | POA: Diagnosis not present

## 2016-02-19 DIAGNOSIS — Z0000 Encounter for general adult medical examination without abnormal findings: Secondary | ICD-10-CM
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
## 2016-03-12 ENCOUNTER — Other Ambulatory Visit: Payer: Self-pay | Admitting: Family Medicine

## 2016-03-12 DIAGNOSIS — Z1231 Encounter for screening mammogram for malignant neoplasm of breast: Secondary | ICD-10-CM

## 2016-03-19 ENCOUNTER — Ambulatory Visit
Admission: RE | Admit: 2016-03-19 | Discharge: 2016-03-19 | Disposition: A | Discharge status: Home / Self Care | Payer: Medicare Other | Source: Ambulatory Visit | Attending: Family Medicine | Admitting: Family Medicine

## 2016-03-19 DIAGNOSIS — Z1231 Encounter for screening mammogram for malignant neoplasm of breast: Secondary | ICD-10-CM | POA: Insufficient documentation

## 2016-03-30 ENCOUNTER — Other Ambulatory Visit: Payer: Self-pay | Admitting: Family Medicine

## 2016-03-31 DIAGNOSIS — Z08 Encounter for follow-up examination after completed treatment for malignant neoplasm: Secondary | ICD-10-CM | POA: Diagnosis not present

## 2016-03-31 DIAGNOSIS — C44712 Basal cell carcinoma of skin of right lower limb, including hip: Secondary | ICD-10-CM | POA: Diagnosis not present

## 2016-03-31 DIAGNOSIS — L814 Other melanin hyperpigmentation: Secondary | ICD-10-CM | POA: Diagnosis not present

## 2016-03-31 DIAGNOSIS — Z85828 Personal history of other malignant neoplasm of skin: Secondary | ICD-10-CM | POA: Diagnosis not present

## 2016-03-31 DIAGNOSIS — D485 Neoplasm of uncertain behavior of skin: Secondary | ICD-10-CM | POA: Diagnosis not present

## 2016-03-31 DIAGNOSIS — H5340 Unspecified visual field defects: Secondary | ICD-10-CM | POA: Diagnosis not present

## 2016-03-31 DIAGNOSIS — L820 Inflamed seborrheic keratosis: Secondary | ICD-10-CM | POA: Diagnosis not present

## 2016-03-31 DIAGNOSIS — L821 Other seborrheic keratosis: Secondary | ICD-10-CM | POA: Diagnosis not present

## 2016-03-31 DIAGNOSIS — L538 Other specified erythematous conditions: Secondary | ICD-10-CM | POA: Diagnosis not present

## 2016-04-06 ENCOUNTER — Other Ambulatory Visit: Payer: Self-pay | Admitting: Family Medicine

## 2016-04-06 NOTE — Telephone Encounter (Signed)
AWV 02/19/2016. Renaldo Fiddler, CMA

## 2016-04-17 ENCOUNTER — Other Ambulatory Visit: Payer: Self-pay | Admitting: Family Medicine

## 2016-04-30 ENCOUNTER — Other Ambulatory Visit: Payer: Self-pay

## 2016-04-30 MED ORDER — METOPROLOL TARTRATE 25 MG PO TABS: 12.5000 mg | ORAL_TABLET | Freq: Two times a day (BID) | ORAL | 12 refills | Status: DC

## 2016-05-13 DIAGNOSIS — C44712 Basal cell carcinoma of skin of right lower limb, including hip: Secondary | ICD-10-CM | POA: Diagnosis not present

## 2016-06-30 DIAGNOSIS — H16143 Punctate keratitis, bilateral: Secondary | ICD-10-CM | POA: Diagnosis not present

## 2016-07-08 ENCOUNTER — Other Ambulatory Visit: Payer: Self-pay | Admitting: Family Medicine

## 2016-08-04 DIAGNOSIS — I10 Essential (primary) hypertension: Secondary | ICD-10-CM | POA: Diagnosis not present

## 2016-08-04 DIAGNOSIS — E782 Mixed hyperlipidemia: Secondary | ICD-10-CM | POA: Diagnosis not present

## 2016-08-04 DIAGNOSIS — I350 Nonrheumatic aortic (valve) stenosis: Secondary | ICD-10-CM | POA: Diagnosis not present

## 2016-08-04 DIAGNOSIS — K219 Gastro-esophageal reflux disease without esophagitis: Secondary | ICD-10-CM | POA: Diagnosis not present

## 2016-08-25 ENCOUNTER — Other Ambulatory Visit: Payer: Self-pay | Admitting: Family Medicine

## 2016-08-25 ENCOUNTER — Ambulatory Visit (INDEPENDENT_AMBULATORY_CARE_PROVIDER_SITE_OTHER): Payer: Medicare Other | Admitting: Family Medicine

## 2016-08-25 ENCOUNTER — Encounter: Payer: Self-pay | Admitting: Family Medicine

## 2016-08-25 VITALS — BP 162/74 | HR 78 | Temp 97.8°F | Resp 14 | Wt 150.0 lb

## 2016-08-25 DIAGNOSIS — K219 Gastro-esophageal reflux disease without esophagitis: Secondary | ICD-10-CM | POA: Diagnosis not present

## 2016-08-25 DIAGNOSIS — M159 Polyosteoarthritis, unspecified: Secondary | ICD-10-CM

## 2016-08-25 DIAGNOSIS — M79662 Pain in left lower leg: Secondary | ICD-10-CM | POA: Diagnosis not present

## 2016-08-25 DIAGNOSIS — E785 Hyperlipidemia, unspecified: Secondary | ICD-10-CM | POA: Diagnosis not present

## 2016-08-25 DIAGNOSIS — M79661 Pain in right lower leg: Secondary | ICD-10-CM | POA: Diagnosis not present

## 2016-08-25 DIAGNOSIS — I10 Essential (primary) hypertension: Secondary | ICD-10-CM | POA: Diagnosis not present

## 2016-08-25 MED ORDER — RANITIDINE HCL 150 MG PO CAPS: 150.0000 mg | ORAL_CAPSULE | Freq: Two times a day (BID) | ORAL | 11 refills | Status: DC

## 2016-08-25 MED ORDER — MAGNESIUM OXIDE 400 MG PO CAPS: 400.0000 mg | ORAL_CAPSULE | Freq: Two times a day (BID) | ORAL | 11 refills | Status: DC

## 2016-08-25 NOTE — Telephone Encounter (Signed)
Pt contacted office for refill request on the following medications:  traMADol (ULTRAM) 50 MG tablet.  [facility].  CB#[phone]/MW

## 2016-08-25 NOTE — Progress Notes (Signed)
Subjective:  HPI  Hypertension, follow-up:  BP Readings from Last 3 Encounters:  08/25/16 (!) 162/74  02/19/16 (!) 144/62  08/30/15 (!) 169/64    She was last seen for hypertension 6 months ago.  BP at that visit was 144/62. Management since that visit includes cardiology increase metoprolol to 25 mg BID instead of 12.5 mg BID. Pt reports that this was just started on February 21st, so she doesn't know if this had time to get in her system or not. She reports good compliance with treatment. She is not having side effects.  She is exercising. She is adherent to low salt diet.   Outside blood pressures are running 130-140's/ 70's. She is experiencing none.  Patient denies chest pain, chest pressure/discomfort, claudication, dyspnea, exertional chest pressure/discomfort, fatigue, irregular heart beat, lower extremity edema, near-syncope, orthopnea, palpitations, paroxysmal nocturnal dyspnea, syncope and tachypnea.   Cardiovascular risk factors include advanced age (older than 48 for men, 57 for women) and hypertension.   Wt Readings from Last 3 Encounters:  08/25/16 150 lb (68 kg)  02/19/16 151 lb (68.5 kg)  08/30/15 150 lb (68 kg)   ------------------------------------------------------------------------ GERD- Pt was told by cardiologist to stop the Omeprazole and try Tagamet. Pt reports that she is only taking one pill but it does not seem to be working as well and she has a lot of excess gas with it.   Leg pain- Pt reports that she has been having pain and swelling in her calves, bilaterally. She says its at night. She has been eating a banana a day and has seen to help, but she also had an injury to her left knee about 4 weeks ago in her exercise class and she has been seeing the chiropractor for this. She is taking mobic, tylenol and tramadol for the pain. She would like a refill on her tramadol as well.   Prior to Admission medications   Medication Sig Start Date End Date  Taking? Authorizing Provider  amLODipine (NORVASC) 5 MG tablet TAKE 1 TABLET BY MOUTH ONCE DAILY. 02/18/16   Jerrol Banana., MD  azelastine (ASTELIN) 0.1 % nasal spray Place into the nose. 08/03/12   Historical Provider, MD  Calcium Carbonate 1500 (600 CA) MG TABS Take by mouth. 11/18/11   Historical Provider, MD  cetirizine (ZYRTEC ALLERGY) 10 MG tablet Take by mouth. 11/18/11   Historical Provider, MD  fenofibrate 160 MG tablet TAKE 1 TABLET BY MOUTH ONCE DAILY. 09/23/15   Richard Maceo Pro., MD  fluticasone (FLONASE) 50 MCG/ACT nasal spray Place into the nose. 11/18/11   Historical Provider, MD  furosemide (LASIX) 20 MG tablet TAKE 1 TABLET BY MOUTH ONCE DAILY. 10/26/15   Richard Maceo Pro., MD  losartan (COZAAR) 100 MG tablet TAKE 1 TABLET BY MOUTH ONCE DAILY. 04/17/16   Richard Maceo Pro., MD  meloxicam (MOBIC) 7.5 MG tablet Take 1 tablet (7.5 mg total) by mouth daily as needed. 07/08/16   Richard Maceo Pro., MD  metoprolol tartrate (LOPRESSOR) 25 MG tablet Take 0.5 tablets (12.5 mg total) by mouth 2 (two) times daily. 04/30/16   Richard Maceo Pro., MD  Misc Natural Products (GLUCOSAMINE CHOND COMPLEX/MSM) TABS 1 daily OTC 08/14/15   Jerrol Banana., MD  omeprazole (PRILOSEC) 20 MG capsule TAKE 1 CAPSULE BY MOUTH ONCE DAILY 08/19/15   Jerrol Banana., MD  Probiotic Product (ALIGN) 4 MG CAPS Take by mouth.    Historical Provider, MD  traMADol (ULTRAM) 50 MG tablet TAKE 1/2 OR 1 TABLET BY MOUTH EVERY 8 HOURS AS NEEDED. 03/25/15   Jerrol Banana., MD  VESICARE 5 MG tablet TAKE 1 TABLET BY MOUTH ONCE DAILY. 04/06/16   Richard Maceo Pro., MD    Patient Active Problem List   Diagnosis Date Noted  . Actinic keratosis 12/18/2014  . Allergic rhinitis 12/18/2014  . Arthritis 12/18/2014  . Basal cell carcinoma of skin 12/18/2014  . Gonalgia 12/18/2014  . Narrowing of intervertebral disc space 12/18/2014  . Essential (primary) hypertension 12/18/2014  . Gastro-esophageal  reflux disease without esophagitis 12/18/2014  . HLD (hyperlipidemia) 12/18/2014  . Cardiac murmur 12/18/2014  . Arthritis, degenerative 12/18/2014  . Hypertonicity of bladder 12/18/2014  . Detrusor muscle hypertonia 12/18/2014  . Heart valve disease 12/18/2014  . Asymptomatic varicose veins 12/18/2014    Past Medical History:  Diagnosis Date  . Cataracts, bilateral   . Environmental allergies   . Heart murmur   . Hemorrhoids   . History of chickenpox   . Hypertension   . Incontinence in female   . Osteoarthritis   . Skin cancer     Social History   Social History  . Marital status: Married    Spouse name: N/A  . Number of children: N/A  . Years of education: N/A   Occupational History  . Not on file.   Social History Main Topics  . Smoking status: Never Smoker  . Smokeless tobacco: Never Used  . Alcohol use No  . Drug use: No  . Sexual activity: Not on file   Other Topics Concern  . Not on file   Social History Narrative  . No narrative on file    Allergies  Allergen Reactions  . Accupril  [Quinapril Hcl] Hives and Swelling  . Amoxicillin   . Clinoril  [Sulindac]   . Codeine Sulfate   . Hydrochlorothiazide   . Penicillins   . Sulfa Antibiotics   . Tessalon  [Benzonatate]     Review of Systems  Constitutional: Negative.   HENT: Negative.   Eyes: Negative.   Respiratory: Negative.   Cardiovascular: Positive for leg swelling.  Gastrointestinal: Positive for heartburn.  Genitourinary: Negative.   Musculoskeletal: Positive for joint pain and myalgias.  Skin: Negative.   Neurological: Negative.   Endo/Heme/Allergies: Negative.   Psychiatric/Behavioral: Negative.     Immunization History  Administered Date(s) Administered  . Influenza, High Dose Seasonal PF 02/19/2016  . Influenza-Unspecified 03/16/2015  . Pneumococcal Conjugate-13 01/04/2014  . Pneumococcal Polysaccharide-23 11/22/2006  . Zoster 04/08/2010    Objective:  BP (!) 162/74 (BP  Location: Left Arm, Patient Position: Sitting, Cuff Size: Normal)   Pulse 78   Temp 97.8 F (36.6 C) (Oral)   Resp 14   Wt 150 lb (68 kg)   BMI 25.75 kg/m   Physical Exam  Cardiovascular:  Murmur (2/6 sysolic murmur at the right upper sternal boarder) heard.   Lab Results  Component Value Date   WBC 7.0 08/16/2015   HGB 12.7 10/24/2014   HCT 39.1 08/16/2015   PLT 338 08/16/2015   GLUCOSE 90 08/16/2015   CHOL 161 08/16/2015   TRIG 161 (H) 08/16/2015   HDL 71 08/16/2015   LDLCALC 58 08/16/2015   TSH 3.270 08/16/2015    CMP     Component Value Date/Time   NA 144 08/16/2015 0855   K 5.0 08/16/2015 0855   CL 103 08/16/2015 0855   CO2 25 08/16/2015 0855  GLUCOSE 90 08/16/2015 0855   GLUCOSE 100 (H) 04/15/2009 0858   BUN 18 08/16/2015 0855   CREATININE 0.85 08/16/2015 0855   CALCIUM 10.1 08/16/2015 0855   PROT 6.9 08/16/2015 0855   ALBUMIN 4.3 08/16/2015 0855   AST 14 08/16/2015 0855   ALT 13 08/16/2015 0855   ALKPHOS 63 08/16/2015 0855   BILITOT 0.4 08/16/2015 0855   GFRNONAA 66 08/16/2015 0855   GFRAA 76 08/16/2015 0855    Assessment and Plan :  1. Essential (primary) hypertension Not to goal. Cardiology increase Metoprolol to 25 mg BID. Follow for now. Continue to monitor at home and follow up in 2 months.  - CBC with Differential/Platelet - TSH  2. Gastro-esophageal reflux disease without esophagitis Worse since stopping Omeprazole and starting Tagement per cardiology. Start Ranitidine. Follow up 2 months.  - ranitidine (ZANTAC) 150 MG capsule; Take 1 capsule (150 mg total) by mouth 2 (two) times daily.  Dispense: 60 capsule; Refill: 11  3. Osteoarthritis of multiple joints, unspecified osteoarthritis type   4. Bilateral calf pain Start mag ox. If not better may try CoQ10. Follow up in 2 months.  - Magnesium Oxide 400 MG CAPS; Take 1 capsule (400 mg total) by mouth 2 (two) times daily.  Dispense: 60 capsule; Refill: 11  5. Hyperlipidemia, unspecified  hyperlipidemia type  - Lipid Panel With LDL/HDL Ratio - Comprehensive metabolic panel  HPI, Exam, and A&P Transcribed under the direction and in the presence of Richard L. Cranford Mon, MD  Electronically Signed: Katina Dung, CMA I have done the exam and reviewed the above chart and it is accurate to the best of my knowledge. Development worker, community has been used in this note in any air is in the dictation or transcription are unintentional.  [facility] 08/25/2016 2:20 PM

## 2016-08-25 NOTE — Telephone Encounter (Signed)
Saw you today. Last refill 03/25/2015. Renaldo Fiddler, CMA

## 2016-08-25 NOTE — Progress Notes (Deleted)
       Patient: Teresa Moyer Female    DOB: [birth year]   81 y.o.   MRN: [id number] Visit Date: 08/25/2016  Today's Provider: [name], MD   No chief complaint on file.  Subjective:    HPI     Allergies  Allergen Reactions  . Accupril  [Quinapril Hcl] Hives and Swelling  . Amoxicillin   . Clinoril  [Sulindac]   . Codeine Sulfate   . Hydrochlorothiazide   . Penicillins   . Sulfa Antibiotics   . Tessalon  [Benzonatate]      Current Outpatient Prescriptions:  .  amLODipine (NORVASC) 5 MG tablet, TAKE 1 TABLET BY MOUTH ONCE DAILY., Disp: 30 tablet, Rfl: 12 .  azelastine (ASTELIN) 0.1 % nasal spray, Place into the nose., Disp: , Rfl:  .  Calcium Carbonate 1500 (600 CA) MG TABS, Take by mouth., Disp: , Rfl:  .  cetirizine (ZYRTEC ALLERGY) 10 MG tablet, Take by mouth., Disp: , Rfl:  .  fenofibrate 160 MG tablet, TAKE 1 TABLET BY MOUTH ONCE DAILY., Disp: 30 tablet, Rfl: 12 .  fluticasone (FLONASE) 50 MCG/ACT nasal spray, Place into the nose., Disp: , Rfl:  .  furosemide (LASIX) 20 MG tablet, TAKE 1 TABLET BY MOUTH ONCE DAILY., Disp: 30 tablet, Rfl: 12 .  losartan (COZAAR) 100 MG tablet, TAKE 1 TABLET BY MOUTH ONCE DAILY., Disp: 90 tablet, Rfl: 3 .  meloxicam (MOBIC) 7.5 MG tablet, Take 1 tablet (7.5 mg total) by mouth daily as needed., Disp: 60 tablet, Rfl: 5 .  metoprolol tartrate (LOPRESSOR) 25 MG tablet, Take 0.5 tablets (12.5 mg total) by mouth 2 (two) times daily., Disp: 60 tablet, Rfl: 12 .  Misc Natural Products (GLUCOSAMINE CHOND COMPLEX/MSM) TABS, 1 daily OTC, Disp: , Rfl:  .  omeprazole (PRILOSEC) 20 MG capsule, TAKE 1 CAPSULE BY MOUTH ONCE DAILY, Disp: 30 capsule, Rfl: 12 .  Probiotic Product (ALIGN) 4 MG CAPS, Take by mouth., Disp: , Rfl:  .  traMADol (ULTRAM) 50 MG tablet, TAKE 1/2 OR 1 TABLET BY MOUTH EVERY 8 HOURS AS NEEDED., Disp: 60 tablet, Rfl: 5 .  VESICARE 5 MG tablet, TAKE 1 TABLET BY MOUTH ONCE DAILY., Disp: 30 tablet, Rfl: 12  Review of  Systems  Social History  Substance Use Topics  . Smoking status: Never Smoker  . Smokeless tobacco: Never Used  . Alcohol use No   Objective:   There were no vitals taken for this visit. There were no vitals filed for this visit.   Physical Exam      Assessment & Plan:           [name], MD  [facility]

## 2016-08-26 ENCOUNTER — Telehealth: Payer: Self-pay | Admitting: Family Medicine

## 2016-08-26 MED ORDER — TRAMADOL HCL 50 MG PO TABS: ORAL_TABLET | ORAL | 5 refills | Status: DC

## 2016-08-26 NOTE — Telephone Encounter (Signed)
Tramadol RX was ordered at [place] on 08/25/2016. Called RX in at [facility].

## 2016-08-26 NOTE — Telephone Encounter (Signed)
Found RX in the back. Will fax this in now.-aa

## 2016-08-26 NOTE — Telephone Encounter (Signed)
Pharmacy called saying said that she is supposed to have a rx for tramadol sent over yesterday,  They are saying they have not seen any thing requested  Thanks  teri

## 2016-09-01 DIAGNOSIS — I10 Essential (primary) hypertension: Secondary | ICD-10-CM | POA: Diagnosis not present

## 2016-09-01 DIAGNOSIS — E785 Hyperlipidemia, unspecified: Secondary | ICD-10-CM | POA: Diagnosis not present

## 2016-09-02 LAB — CBC WITH DIFFERENTIAL/PLATELET
Basophils Absolute: 0.1 10*3/uL (ref 0.0–0.2)
Basos: 1 %
EOS (ABSOLUTE): 0.1 10*3/uL (ref 0.0–0.4)
Eos: 1 %
Hematocrit: 37.4 % (ref 34.0–46.6)
Hemoglobin: 12.8 g/dL (ref 11.1–15.9)
Immature Grans (Abs): 0 10*3/uL (ref 0.0–0.1)
Immature Granulocytes: 0 %
Lymphocytes Absolute: 2.6 10*3/uL (ref 0.7–3.1)
Lymphs: 24 %
MCH: 29.2 pg (ref 26.6–33.0)
MCHC: 34.2 g/dL (ref 31.5–35.7)
MCV: 85 fL (ref 79–97)
Monocytes Absolute: 1 10*3/uL — ABNORMAL HIGH (ref 0.1–0.9)
Monocytes: 9 %
Neutrophils Absolute: 6.9 10*3/uL (ref 1.4–7.0)
Neutrophils: 65 %
Platelets: 384 10*3/uL — ABNORMAL HIGH (ref 150–379)
RBC: 4.38 x10E6/uL (ref 3.77–5.28)
RDW: 14 % (ref 12.3–15.4)
WBC: 10.6 10*3/uL (ref 3.4–10.8)

## 2016-09-02 LAB — COMPREHENSIVE METABOLIC PANEL
ALT: 17 IU/L (ref 0–32)
AST: 22 IU/L (ref 0–40)
Albumin/Globulin Ratio: 1.7 (ref 1.2–2.2)
Albumin: 4.5 g/dL (ref 3.5–4.8)
Alkaline Phosphatase: 64 IU/L (ref 39–117)
BUN/Creatinine Ratio: 20 (ref 12–28)
BUN: 20 mg/dL (ref 8–27)
Bilirubin Total: 0.4 mg/dL (ref 0.0–1.2)
CO2: 24 mmol/L (ref 18–29)
Calcium: 10.4 mg/dL — ABNORMAL HIGH (ref 8.7–10.3)
Chloride: 100 mmol/L (ref 96–106)
Creatinine, Ser: 0.98 mg/dL (ref 0.57–1.00)
GFR calc Af Amer: 64 mL/min/{1.73_m2} (ref >59)
GFR calc non Af Amer: 55 mL/min/{1.73_m2} — ABNORMAL LOW (ref >59)
Globulin, Total: 2.7 g/dL (ref 1.5–4.5)
Glucose: 97 mg/dL (ref 65–99)
Potassium: 4.2 mmol/L (ref 3.5–5.2)
Sodium: 141 mmol/L (ref 134–144)
Total Protein: 7.2 g/dL (ref 6.0–8.5)

## 2016-09-02 LAB — LIPID PANEL WITH LDL/HDL RATIO
Cholesterol, Total: 171 mg/dL (ref 100–199)
HDL: 74 mg/dL (ref >39)
LDL Calculated: 66 mg/dL (ref 0–99)
LDl/HDL Ratio: 0.9 ratio units (ref 0.0–3.2)
Triglycerides: 157 mg/dL — ABNORMAL HIGH (ref 0–149)
VLDL Cholesterol Cal: 31 mg/dL (ref 5–40)

## 2016-09-02 LAB — TSH: TSH: 2.96 u[IU]/mL (ref 0.450–4.500)

## 2016-09-03 NOTE — Progress Notes (Signed)
Patient advised  ED 

## 2016-09-14 DIAGNOSIS — Z85828 Personal history of other malignant neoplasm of skin: Secondary | ICD-10-CM | POA: Diagnosis not present

## 2016-09-14 DIAGNOSIS — D2272 Melanocytic nevi of left lower limb, including hip: Secondary | ICD-10-CM | POA: Diagnosis not present

## 2016-09-14 DIAGNOSIS — D225 Melanocytic nevi of trunk: Secondary | ICD-10-CM | POA: Diagnosis not present

## 2016-09-14 DIAGNOSIS — D2261 Melanocytic nevi of right upper limb, including shoulder: Secondary | ICD-10-CM | POA: Diagnosis not present

## 2016-09-14 DIAGNOSIS — D485 Neoplasm of uncertain behavior of skin: Secondary | ICD-10-CM | POA: Diagnosis not present

## 2016-09-14 DIAGNOSIS — C4441 Basal cell carcinoma of skin of scalp and neck: Secondary | ICD-10-CM | POA: Diagnosis not present

## 2016-09-23 ENCOUNTER — Other Ambulatory Visit: Payer: Self-pay | Admitting: Family Medicine

## 2016-09-28 ENCOUNTER — Telehealth: Payer: Self-pay | Admitting: Family Medicine

## 2016-09-28 NOTE — Telephone Encounter (Signed)
Pt states she thinks the   Magnesium Oxide 400 MG CAPS  Is causing  Her to have loose stools..  Please advise  [phone]  Thanks Con Memos

## 2016-09-28 NOTE — Telephone Encounter (Signed)
Advised  ED 

## 2016-09-28 NOTE — Telephone Encounter (Signed)
Dr Darnell Level, do you want her to d/c

## 2016-09-28 NOTE — Telephone Encounter (Signed)
D/c

## 2016-10-22 ENCOUNTER — Telehealth: Payer: Self-pay | Admitting: Family Medicine

## 2016-10-22 NOTE — Telephone Encounter (Signed)
Pt states the Rx ranitidine (ZANTAC) 150 MG capsule is not working all day.  Pt is asking if she can get something different or can she take something with this.  [facility].  CB#[phone]/MW

## 2016-10-22 NOTE — Telephone Encounter (Signed)
Sucralfate 1 gram with meals. #[id number]

## 2016-10-22 NOTE — Telephone Encounter (Signed)
Please review-aa 

## 2016-10-23 MED ORDER — SUCRALFATE 1 G PO TABS: 1.0000 g | ORAL_TABLET | Freq: Three times a day (TID) | ORAL | 11 refills | Status: DC

## 2016-10-23 NOTE — Telephone Encounter (Signed)
Pt advised, RX sent in-aa 

## 2016-10-26 ENCOUNTER — Ambulatory Visit (INDEPENDENT_AMBULATORY_CARE_PROVIDER_SITE_OTHER): Payer: Medicare Other | Admitting: Family Medicine

## 2016-10-26 ENCOUNTER — Encounter: Payer: Self-pay | Admitting: Family Medicine

## 2016-10-26 VITALS — BP 160/58 | HR 72 | Temp 97.9°F | Resp 16 | Wt 151.0 lb

## 2016-10-26 DIAGNOSIS — I10 Essential (primary) hypertension: Secondary | ICD-10-CM | POA: Diagnosis not present

## 2016-10-26 DIAGNOSIS — K219 Gastro-esophageal reflux disease without esophagitis: Secondary | ICD-10-CM

## 2016-10-26 DIAGNOSIS — D1779 Benign lipomatous neoplasm of other sites: Secondary | ICD-10-CM

## 2016-10-26 NOTE — Progress Notes (Signed)
Subjective:  HPI Pt is here for a 2 month follow up on HTN and GERD. Her metoprolol was increased to BID and her BP have been running 130-140's/60-70's at home. No side effects. For her GERD she was worried about the long term side effects. We changed her to ranitidine. She called back later and said it was not working so we called in Sucralfate. She has been taking that along with the ranitidine for the last 3 days. She says that it seems to help because she ate things that would normally bother her more and it only slightly bothered her. Pt was having calf pain at night and started on Mag OX BID. She reports that twice a day was giving her loose stools so she cut back to one and a day that has seemed to help. She says that she went to the beach as she does yearly and she recovered better this time than she had in the past.   Pt reports that her massage therapist wanted to ask her PCP about her left hip region. She noticed that her left hip area looked larger than her right. She has been going to her for years and she knows her body. She wanted to ask if Dr. Rosanna Randy thought it was a Lipoma. Area is only painful if she lays on her left side at night for long periods of time.   Prior to Admission medications   Medication Sig Start Date End Date Taking? Authorizing Provider  amLODipine (NORVASC) 5 MG tablet TAKE 1 TABLET BY MOUTH ONCE DAILY. 02/18/16   Jerrol Banana., MD  azelastine (ASTELIN) 0.1 % nasal spray Place into the nose. 08/03/12   [provider]  Calcium Carbonate 1500 (600 CA) MG TABS Take by mouth. 11/18/11   [provider]  cetirizine (ZYRTEC ALLERGY) 10 MG tablet Take by mouth. 11/18/11   [provider]  fenofibrate 160 MG tablet TAKE 1 TABLET BY MOUTH ONCE DAILY. 09/23/16   Jerrol Banana., MD  fluticasone [facility]) 50 MCG/ACT nasal spray Place into the nose. 11/18/11   [provider]  furosemide (LASIX) 20 MG tablet TAKE 1 TABLET BY MOUTH  ONCE DAILY. 10/26/15   Jerrol Banana., MD  losartan (COZAAR) 100 MG tablet TAKE 1 TABLET BY MOUTH ONCE DAILY. 04/17/16   Jerrol Banana., MD  Magnesium Oxide 400 MG CAPS Take 1 capsule (400 mg total) by mouth 2 (two) times daily. 08/25/16   Jerrol Banana., MD  meloxicam (MOBIC) 7.5 MG tablet Take 1 tablet (7.5 mg total) by mouth daily as needed. 07/08/16   Jerrol Banana., MD  metoprolol tartrate (LOPRESSOR) 25 MG tablet Take 0.5 tablets (12.5 mg total) by mouth 2 (two) times daily. Patient taking differently: Take 25 mg by mouth 2 (two) times daily.  04/30/16   Jerrol Banana., MD  Misc Natural Products (GLUCOSAMINE CHOND COMPLEX/MSM) TABS 1 daily OTC 08/14/15   Jerrol Banana., MD  omeprazole (PRILOSEC) 20 MG capsule TAKE 1 CAPSULE BY MOUTH ONCE DAILY Patient not taking: Reported on 08/25/2016 08/19/15   Jerrol Banana., MD  Probiotic Product (ALIGN) 4 MG CAPS Take by mouth.    [provider]  ranitidine (ZANTAC) 150 MG capsule Take 1 capsule (150 mg total) by mouth 2 (two) times daily. 08/25/16   Jerrol Banana., MD  sucralfate (CARAFATE) 1 g tablet Take 1 tablet (1 g total) by  mouth 3 (three) times daily. 10/23/16   Jerrol Banana., MD  traMADol (ULTRAM) 50 MG tablet TAKE 1/2 OR 1 TABLET BY MOUTH EVERY 8 HOURS AS NEEDED. 08/26/16   Jerrol Banana., MD  VESICARE 5 MG tablet TAKE 1 TABLET BY MOUTH ONCE DAILY. 04/06/16   Jerrol Banana., MD    Patient Active Problem List   Diagnosis Date Noted  . Actinic keratosis 12/18/2014  . Allergic rhinitis 12/18/2014  . Arthritis 12/18/2014  . Basal cell carcinoma of skin 12/18/2014  . Gonalgia 12/18/2014  . Narrowing of intervertebral disc space 12/18/2014  . Essential (primary) hypertension 12/18/2014  . Gastro-esophageal reflux disease without esophagitis 12/18/2014  . HLD (hyperlipidemia) 12/18/2014  . Cardiac murmur 12/18/2014  . Arthritis, degenerative 12/18/2014  .  Hypertonicity of bladder 12/18/2014  . Detrusor muscle hypertonia 12/18/2014  . Heart valve disease 12/18/2014  . Asymptomatic varicose veins 12/18/2014    Past Medical History:  Diagnosis Date  . Cataracts, bilateral   . Environmental allergies   . Heart murmur   . Hemorrhoids   . History of chickenpox   . Hypertension   . Incontinence in female   . Osteoarthritis   . Skin cancer     Social History   Social History  . Marital status: Married    Spouse name: N/A  . Number of children: N/A  . Years of education: N/A   Occupational History  . Not on file.   Social History Main Topics  . Smoking status: Never Smoker  . Smokeless tobacco: Never Used  . Alcohol use No  . Drug use: No  . Sexual activity: Not on file   Other Topics Concern  . Not on file   Social History Narrative  . No narrative on file    Allergies  Allergen Reactions  . Accupril  [Quinapril Hcl] Hives and Swelling  . Amoxicillin   . Clinoril  [Sulindac]   . Codeine Sulfate   . Hydrochlorothiazide   . Penicillins   . Sulfa Antibiotics   . Tessalon  [Benzonatate]     Review of Systems  Constitutional: Negative.   HENT: Negative.   Eyes: Negative.   Respiratory: Negative.   Cardiovascular: Negative.   Gastrointestinal: Negative.   Genitourinary: Negative.   Musculoskeletal: Positive for joint pain.  Skin: Negative.   Neurological: Negative.   Endo/Heme/Allergies: Negative.   Psychiatric/Behavioral: Negative.     Immunization History  Administered Date(s) Administered  . Influenza, High Dose Seasonal PF 02/19/2016  . Influenza-Unspecified 03/16/2015  . Pneumococcal Conjugate-13 01/04/2014  . Pneumococcal Polysaccharide-23 11/22/2006  . Zoster 04/08/2010    Objective:  BP (!) 160/58 (BP Location: Left Arm, Patient Position: Sitting, Cuff Size: Normal)   Pulse 72   Temp 97.9 F (36.6 C) (Oral)   Resp 16   Wt 151 lb (68.5 kg)   SpO2 98%   BMI 25.92 kg/m   Physical Exam    Constitutional: She is oriented to person, place, and time and well-developed, well-nourished, and in no distress.  HENT:  Head: Normocephalic and atraumatic.  Right Ear: External ear normal.  Left Ear: External ear normal.  Nose: Nose normal.  Mouth/Throat: Oropharynx is clear and moist.  Eyes: Conjunctivae and EOM are normal. Pupils are equal, round, and reactive to light.  Neck: Normal range of motion. Neck supple.  Cardiovascular: Normal rate, regular rhythm, normal heart sounds and intact distal pulses.   Pulmonary/Chest: Effort normal and breath sounds normal.  Abdominal:  Soft. Bowel sounds are normal.  Musculoskeletal: Normal range of motion.  Growth on left hip c/w lipoma.  Neurological: She is alert and oriented to person, place, and time. She has normal reflexes. Gait normal. GCS score is 15.  Skin: Skin is warm and dry.  Psychiatric: Mood, memory, affect and judgment normal.    Lab Results  Component Value Date   WBC 10.6 09/01/2016   HGB 12.7 10/24/2014   HCT 37.4 09/01/2016   PLT 384 (H) 09/01/2016   GLUCOSE 97 09/01/2016   CHOL 171 09/01/2016   TRIG 157 (H) 09/01/2016   HDL 74 09/01/2016   LDLCALC 66 09/01/2016   TSH 2.960 09/01/2016    CMP     Component Value Date/Time   NA 141 09/01/2016 0922   K 4.2 09/01/2016 0922   CL 100 09/01/2016 0922   CO2 24 09/01/2016 0922   GLUCOSE 97 09/01/2016 0922   GLUCOSE 100 (H) 04/15/2009 0858   BUN 20 09/01/2016 0922   CREATININE 0.98 09/01/2016 0922   CALCIUM 10.4 (H) 09/01/2016 0922   PROT 7.2 09/01/2016 0922   ALBUMIN 4.5 09/01/2016 0922   AST 22 09/01/2016 0922   ALT 17 09/01/2016 0922   ALKPHOS 64 09/01/2016 0922   BILITOT 0.4 09/01/2016 0922   GFRNONAA 55 (L) 09/01/2016 0922   GFRAA 64 09/01/2016 0922    Assessment and Plan :  1. Essential (primary) hypertension Elevated a little today but Stable at home.  2. Gastro-esophageal reflux disease without esophagitis Continue Sucralfate and Ranitidine.    3. Lipoma of other specified sites  - Ambulatory referral to Orthopedic Surgery   HPI, Exam, and A&P Transcribed under the direction and in the presence of Margit Batte L. Cranford Mon, MD  Electronically Signed: Katina Dung, CMA I have done the exam and reviewed the above chart and it is accurate to the best of my knowledge. Development worker, community has been used in this note in any air is in the dictation or transcription are unintentional.  [facility] 10/26/2016 2:54 PM

## 2016-10-29 DIAGNOSIS — L905 Scar conditions and fibrosis of skin: Secondary | ICD-10-CM | POA: Diagnosis not present

## 2016-10-29 DIAGNOSIS — C4441 Basal cell carcinoma of skin of scalp and neck: Secondary | ICD-10-CM | POA: Diagnosis not present

## 2016-11-02 ENCOUNTER — Other Ambulatory Visit: Payer: Self-pay | Admitting: Family Medicine

## 2016-11-10 DIAGNOSIS — M25552 Pain in left hip: Secondary | ICD-10-CM | POA: Diagnosis not present

## 2016-11-10 DIAGNOSIS — M1712 Unilateral primary osteoarthritis, left knee: Secondary | ICD-10-CM | POA: Diagnosis not present

## 2016-11-10 DIAGNOSIS — R2242 Localized swelling, mass and lump, left lower limb: Secondary | ICD-10-CM | POA: Diagnosis not present

## 2016-11-10 DIAGNOSIS — Z96651 Presence of right artificial knee joint: Secondary | ICD-10-CM | POA: Diagnosis not present

## 2016-11-11 ENCOUNTER — Other Ambulatory Visit: Payer: Self-pay | Admitting: Unknown Physician Specialty

## 2016-11-11 DIAGNOSIS — M25552 Pain in left hip: Secondary | ICD-10-CM

## 2016-11-12 DIAGNOSIS — H35371 Puckering of macula, right eye: Secondary | ICD-10-CM | POA: Diagnosis not present

## 2016-11-20 ENCOUNTER — Ambulatory Visit
Admission: RE | Admit: 2016-11-20 | Discharge: 2016-11-20 | Disposition: A | Discharge status: Home / Self Care | Payer: Medicare Other | Source: Ambulatory Visit | Attending: Unknown Physician Specialty | Admitting: Unknown Physician Specialty

## 2016-11-20 DIAGNOSIS — X58XXXA Exposure to other specified factors, initial encounter: Secondary | ICD-10-CM | POA: Diagnosis not present

## 2016-11-20 DIAGNOSIS — M5136 Other intervertebral disc degeneration, lumbar region: Secondary | ICD-10-CM | POA: Insufficient documentation

## 2016-11-20 DIAGNOSIS — M5137 Other intervertebral disc degeneration, lumbosacral region: Secondary | ICD-10-CM | POA: Diagnosis not present

## 2016-11-20 DIAGNOSIS — M25552 Pain in left hip: Secondary | ICD-10-CM

## 2016-11-20 DIAGNOSIS — S73192A Other sprain of left hip, initial encounter: Secondary | ICD-10-CM | POA: Insufficient documentation

## 2016-11-20 DIAGNOSIS — M7601 Gluteal tendinitis, right hip: Secondary | ICD-10-CM | POA: Diagnosis not present

## 2016-11-20 DIAGNOSIS — R2242 Localized swelling, mass and lump, left lower limb: Secondary | ICD-10-CM | POA: Diagnosis present

## 2016-11-20 LAB — POCT I-STAT CREATININE: Creatinine, Ser: 0.9 mg/dL (ref 0.44–1.00)

## 2016-11-20 MED ORDER — GADOBENATE DIMEGLUMINE 529 MG/ML IV SOLN
10.0000 mL | Freq: Once | INTRAVENOUS | Status: AC | PRN
  Administered 2016-11-20: 10 mL via INTRAVENOUS

## 2016-11-25 ENCOUNTER — Telehealth: Payer: Self-pay | Admitting: Family Medicine

## 2016-11-25 NOTE — Telephone Encounter (Signed)
Pt stated that she needs to complete a form for [facility] and she would like a nurse to return her call so she can ask a few questions to complete the form. Please advise. Thanks TNP

## 2016-11-26 NOTE — Telephone Encounter (Signed)
Dr. Darnell Level, can we do this over the phone or does she need an OV? Please advise. Thanks!

## 2016-11-26 NOTE — Telephone Encounter (Signed)
I do not know what the form is.

## 2016-11-27 NOTE — Telephone Encounter (Signed)
Patient has a form with some questions about her medical history.  She was informed that I could not do it over the phone as it will require looking up information from years ago.  She was advised that if she brought the form in to the office we could try to complete it for her but it would take some time.  She will bring the form in when she gets a chance. ED

## 2016-12-03 DIAGNOSIS — H93299 Other abnormal auditory perceptions, unspecified ear: Secondary | ICD-10-CM | POA: Diagnosis not present

## 2016-12-03 DIAGNOSIS — J301 Allergic rhinitis due to pollen: Secondary | ICD-10-CM | POA: Diagnosis not present

## 2016-12-03 DIAGNOSIS — H6123 Impacted cerumen, bilateral: Secondary | ICD-10-CM | POA: Diagnosis not present

## 2017-01-11 ENCOUNTER — Other Ambulatory Visit: Payer: Self-pay | Admitting: Family Medicine

## 2017-02-23 ENCOUNTER — Ambulatory Visit (INDEPENDENT_AMBULATORY_CARE_PROVIDER_SITE_OTHER): Payer: Medicare Other | Admitting: Family Medicine

## 2017-02-23 ENCOUNTER — Ambulatory Visit (INDEPENDENT_AMBULATORY_CARE_PROVIDER_SITE_OTHER): Payer: Medicare Other

## 2017-02-23 ENCOUNTER — Encounter: Payer: Self-pay | Admitting: Family Medicine

## 2017-02-23 VITALS — BP 142/60 | HR 99 | Temp 99.1°F | Ht 64.0 in | Wt 148.0 lb

## 2017-02-23 VITALS — BP 162/74 | HR 99 | Temp 99.1°F | Ht 64.0 in | Wt 148.4 lb

## 2017-02-23 DIAGNOSIS — I10 Essential (primary) hypertension: Secondary | ICD-10-CM | POA: Diagnosis not present

## 2017-02-23 DIAGNOSIS — M1991 Primary osteoarthritis, unspecified site: Secondary | ICD-10-CM

## 2017-02-23 DIAGNOSIS — K219 Gastro-esophageal reflux disease without esophagitis: Secondary | ICD-10-CM

## 2017-02-23 DIAGNOSIS — Z23 Encounter for immunization: Secondary | ICD-10-CM

## 2017-02-23 DIAGNOSIS — Z0000 Encounter for general adult medical examination without abnormal findings: Secondary | ICD-10-CM | POA: Diagnosis not present
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
## 2017-02-23 NOTE — Progress Notes (Signed)
Patient: Teresa Moyer Female    DOB: 08-May-1936   81 y.o.   MRN: [id number] Visit Date: 02/23/2017  Today's Provider: [name], MD   Chief Complaint  Patient presents with  . Hypertension   Subjective:    HPI  Hypertension, follow-up:  BP Readings from Last 3 Encounters:  02/23/17 (!) 142/60  02/23/17 (!) 162/74  10/26/16 (!) 160/58    She was last seen for hypertension 5 months ago.  BP at that visit was 160/58. Management since that visit includes none. She reports good compliance with treatment. She is not having side effects.  She is exercising. She is adherent to low salt diet.   Outside blood pressures are 140-150/60-70 Patient denies chest pain, chest pressure/discomfort, claudication, dyspnea, exertional chest pressure/discomfort, fatigue, irregular heart beat, lower extremity edema, near-syncope, orthopnea, palpitations, paroxysmal nocturnal dyspnea, syncope and tachypnea.   Cardiovascular risk factors include advanced age (older than 31 for men, 24 for women) and hypertension.     Wt Readings from Last 3 Encounters:  02/23/17 148 lb (67.1 kg)  02/23/17 148 lb 6.4 oz (67.3 kg)  10/26/16 151 lb (68.5 kg)   -----------------------------------------------------------------------  Pt is also wanting to discuss her Sucrafate. She is finding it hard to take these pills 3 times a day with the directions that are on the bottle because she has to take it 2 hours before or after a meal. She also noticed that it was causing her not to absorb her BP medication and BP was running higher. She says she can not take it before exercise class because it makes her feel weak. She would like to know if Dr. Carmin Richmond has heard of the Blood type diet. She is blood type O and there is certain things you eat or don't eat for your blood type and she would like to know if following this would help her GERD and other things.   Also when she had her lifeline screening test  done she was told her thyroid size was abnormal. Pt would like to know if Dr. Rosanna Randy thinks this is true.      Allergies  Allergen Reactions  . Accupril  [Quinapril Hcl] Hives and Swelling  . Amoxicillin   . Clinoril  [Sulindac]   . Codeine Sulfate   . Hydrochlorothiazide   . Penicillins   . Sulfa Antibiotics   . Tessalon  [Benzonatate]      Current Outpatient Prescriptions:  .  amLODipine (NORVASC) 5 MG tablet, TAKE 1 TABLET BY MOUTH ONCE DAILY., Disp: 30 tablet, Rfl: 12 .  azelastine (ASTELIN) 0.1 % nasal spray, Place 1 spray into both nostrils daily. , Disp: , Rfl:  .  cetirizine (ZYRTEC ALLERGY) 10 MG tablet, Take 10 mg by mouth daily. , Disp: , Rfl:  .  fenofibrate 160 MG tablet, TAKE 1 TABLET BY MOUTH ONCE DAILY. (Patient taking differently: TAKE 1/2 TABLET BY MOUTH ONCE DAILY.), Disp: 90 tablet, Rfl: 3 .  fluticasone (FLONASE) 50 MCG/ACT nasal spray, Place 1 spray into both nostrils daily. , Disp: , Rfl:  .  furosemide (LASIX) 20 MG tablet, TAKE 1 TABLET BY MOUTH ONCE DAILY, Disp: 30 tablet, Rfl: 11 .  losartan (COZAAR) 100 MG tablet, TAKE 1 TABLET BY MOUTH ONCE DAILY, Disp: 90 tablet, Rfl: 3 .  meloxicam (MOBIC) 7.5 MG tablet, Take 1 tablet (7.5 mg total) by mouth daily as needed. (Patient taking differently: Take 7.5 mg by mouth daily. ), Disp: 60  tablet, Rfl: 5 .  metoprolol tartrate (LOPRESSOR) 25 MG tablet, Take 0.5 tablets (12.5 mg total) by mouth 2 (two) times daily. (Patient taking differently: Take 25 mg by mouth 2 (two) times daily. ), Disp: 60 tablet, Rfl: 12 .  Probiotic Product (ALIGN) 4 MG CAPS, Take by mouth daily. , Disp: , Rfl:  .  ranitidine (ZANTAC) 150 MG capsule, Take 1 capsule (150 mg total) by mouth 2 (two) times daily., Disp: 60 capsule, Rfl: 11 .  sucralfate (CARAFATE) 1 g tablet, Take 1 tablet (1 g total) by mouth 3 (three) times daily., Disp: 90 tablet, Rfl: 11 .  traMADol (ULTRAM) 50 MG tablet, TAKE 1/2 OR 1 TABLET BY MOUTH EVERY 8 HOURS AS NEEDED.,  Disp: 60 tablet, Rfl: 5 .  VESICARE 5 MG tablet, TAKE 1 TABLET BY MOUTH ONCE DAILY., Disp: 30 tablet, Rfl: 12 .  Magnesium Oxide 400 MG CAPS, Take 1 capsule (400 mg total) by mouth 2 (two) times daily. (Patient not taking: Reported on 02/23/2017), Disp: 60 capsule, Rfl: 11  Review of Systems  Constitutional: Negative.   HENT: Negative.   Eyes: Negative.   Respiratory: Negative.   Cardiovascular: Negative.   Gastrointestinal: Negative.   Endocrine: Negative.   Genitourinary: Negative.   Musculoskeletal: Negative.   Skin: Negative.   Allergic/Immunologic: Negative.   Neurological: Negative.   Hematological: Negative.   Psychiatric/Behavioral: Negative.     Social History  Substance Use Topics  . Smoking status: Never Smoker  . Smokeless tobacco: Never Used  . Alcohol use No   Objective:   BP (!) 142/60   Pulse 99   Temp 99.1 F (37.3 C) (Oral)   Ht 5\' 4"  (1.626 m)   Wt 148 lb (67.1 kg)   BMI 25.40 kg/m  Vitals:   02/23/17 1426 02/23/17 1428  BP: (!) 162/74 (!) 142/60  Pulse: 99   Temp: 99.1 F (37.3 C)   TempSrc: Oral   Weight: 148 lb (67.1 kg)   Height: 5\' 4"  (1.626 m)      Physical Exam  Constitutional: She is oriented to person, place, and time. She appears well-developed and well-nourished.  HENT:  Head: Normocephalic and atraumatic.  Right Ear: External ear normal.  Left Ear: External ear normal.  Nose: Nose normal.  Mouth/Throat: Oropharynx is clear and moist.  Eyes: Conjunctivae are normal. No scleral icterus.  Neck: Neck supple. No thyromegaly present.  Cardiovascular: Normal rate, regular rhythm and normal heart sounds.   Pulmonary/Chest: Effort normal and breath sounds normal.  Abdominal: Soft.  Lymphadenopathy:    She has no cervical adenopathy.  Neurological: She is alert and oriented to person, place, and time.  Skin: Skin is warm and dry.  Psychiatric: She has a normal mood and affect. Her behavior is normal. Judgment and thought content  normal.        Assessment & Plan:   HTN  GERD Wean carafate. HLD OA     HPI, Exam, and A&P Transcribed under the direction and in the presence of Richard L. Cranford Mon, MD  Electronically Signed: Katina Dung, CMA  I have done the exam and reviewed the above chart and it is accurate to the best of my knowledge. Development worker, community has been used in this note in any air is in the dictation or transcription are unintentional.  [name], MD  [place]

## 2017-02-23 NOTE — Patient Instructions (Signed)
Teresa Moyer , Thank you for taking time to come for your Medicare Wellness Visit. I appreciate your ongoing commitment to your health goals. Please review the following plan we discussed and let me know if I can assist you in the future.   Screening recommendations/referrals: Colonoscopy: up to date Mammogram: up to date Bone Density: up to date Recommended yearly ophthalmology/optometry visit for glaucoma screening and checkup Recommended yearly dental visit for hygiene and checkup  Vaccinations: Influenza vaccine: completed today Pneumococcal vaccine: completed series Tdap vaccine: up to date, due 06/2017 Shingles vaccine: completed 04/08/10  Advanced directives: Please bring a copy of your POA (Power of [place]) and/or Living Will to your next appointment.   Conditions/risks identified: Recommend increasing water intake to 6-8 glasses a day.   Next appointment: 2:00 PM today with PCP   Preventive Care 63 Years and Older, Female Preventive care refers to lifestyle choices and visits with your health care provider that can promote health and wellness. What does preventive care include?  A yearly physical exam. This is also called an annual well check.  Dental exams once or twice a year.  Routine eye exams. Ask your health care provider how often you should have your eyes checked.  Personal lifestyle choices, including:  Daily care of your teeth and gums.  Regular physical activity.  Eating a healthy diet.  Avoiding tobacco and drug use.  Limiting alcohol use.  Practicing safe sex.  Taking low-dose aspirin every day.  Taking vitamin and mineral supplements as recommended by your health care provider. What happens during an annual well check? The services and screenings done by your health care provider during your annual well check will depend on your age, overall health, lifestyle risk factors, and family history of disease. Counseling  Your health care provider  may ask you questions about your:  Alcohol use.  Tobacco use.  Drug use.  Emotional well-being.  Home and relationship well-being.  Sexual activity.  Eating habits.  History of falls.  Memory and ability to understand (cognition).  Work and work Statistician.  Reproductive health. Screening  You may have the following tests or measurements:  Height, weight, and BMI.  Blood pressure.  Lipid and cholesterol levels. These may be checked every 5 years, or more frequently if you are over 20 years old.  Skin check.  Lung cancer screening. You may have this screening every year starting at age 28 if you have a 30-pack-year history of smoking and currently smoke or have quit within the past 15 years.  Fecal occult blood test (FOBT) of the stool. You may have this test every year starting at age 25.  Flexible sigmoidoscopy or colonoscopy. You may have a sigmoidoscopy every 5 years or a colonoscopy every 10 years starting at age 12.  Hepatitis C blood test.  Hepatitis B blood test.  Sexually transmitted disease (STD) testing.  Diabetes screening. This is done by checking your blood sugar (glucose) after you have not eaten for a while (fasting). You may have this done every 1-3 years.  Bone density scan. This is done to screen for osteoporosis. You may have this done starting at age 68.  Mammogram. This may be done every 1-2 years. Talk to your health care provider about how often you should have regular mammograms. Talk with your health care provider about your test results, treatment options, and if necessary, the need for more tests. Vaccines  Your health care provider may recommend certain vaccines, such as:  Influenza  vaccine. This is recommended every year.  Tetanus, diphtheria, and acellular pertussis (Tdap, Td) vaccine. You may need a Td booster every 10 years.  Zoster vaccine. You may need this after age 25.  Pneumococcal 13-valent conjugate (PCV13) vaccine.  One dose is recommended after age 76.  Pneumococcal polysaccharide (PPSV23) vaccine. One dose is recommended after age 43. Talk to your health care provider about which screenings and vaccines you need and how often you need them. This information is not intended to replace advice given to you by your health care provider. Make sure you discuss any questions you have with your health care provider. Document Released: 06/28/2015 Document Revised: 02/19/2016 Document Reviewed: 04/02/2015 Elsevier Interactive Patient Education  2017 [place] Prevention in the Home Falls can cause injuries. They can happen to people of all ages. There are many things you can do to make your home safe and to help prevent falls. What can I do on the outside of my home?  Regularly fix the edges of walkways and driveways and fix any cracks.  Remove anything that might make you trip as you walk through a door, such as a raised step or threshold.  Trim any bushes or trees on the path to your home.  Use bright outdoor lighting.  Clear any walking paths of anything that might make someone trip, such as rocks or tools.  Regularly check to see if handrails are loose or broken. Make sure that both sides of any steps have handrails.  Any raised decks and porches should have guardrails on the edges.  Have any leaves, snow, or ice cleared regularly.  Use sand or salt on walking paths during winter.  Clean up any spills in your garage right away. This includes oil or grease spills. What can I do in the bathroom?  Use night lights.  Install grab bars by the toilet and in the tub and shower. Do not use towel bars as grab bars.  Use non-skid mats or decals in the tub or shower.  If you need to sit down in the shower, use a plastic, non-slip stool.  Keep the floor dry. Clean up any water that spills on the floor as soon as it happens.  Remove soap buildup in the tub or shower regularly.  Attach bath  mats securely with double-sided non-slip rug tape.  Do not have throw rugs and other things on the floor that can make you trip. What can I do in the bedroom?  Use night lights.  Make sure that you have a light by your bed that is easy to reach.  Do not use any sheets or blankets that are too big for your bed. They should not hang down onto the floor.  Have a firm chair that has side arms. You can use this for support while you get dressed.  Do not have throw rugs and other things on the floor that can make you trip. What can I do in the kitchen?  Clean up any spills right away.  Avoid walking on wet floors.  Keep items that you use a lot in easy-to-reach places.  If you need to reach something above you, use a strong step stool that has a grab bar.  Keep electrical cords out of the way.  Do not use floor polish or wax that makes floors slippery. If you must use wax, use non-skid floor wax.  Do not have throw rugs and other things on the floor that can  make you trip. What can I do with my stairs?  Do not leave any items on the stairs.  Make sure that there are handrails on both sides of the stairs and use them. Fix handrails that are broken or loose. Make sure that handrails are as long as the stairways.  Check any carpeting to make sure that it is firmly attached to the stairs. Fix any carpet that is loose or worn.  Avoid having throw rugs at the top or bottom of the stairs. If you do have throw rugs, attach them to the floor with carpet tape.  Make sure that you have a light switch at the top of the stairs and the bottom of the stairs. If you do not have them, ask someone to add them for you. What else can I do to help prevent falls?  Wear shoes that:  Do not have high heels.  Have rubber bottoms.  Are comfortable and fit you well.  Are closed at the toe. Do not wear sandals.  If you use a stepladder:  Make sure that it is fully opened. Do not climb a closed  stepladder.  Make sure that both sides of the stepladder are locked into place.  Ask someone to hold it for you, if possible.  Clearly mark and make sure that you can see:  Any grab bars or handrails.  First and last steps.  Where the edge of each step is.  Use tools that help you move around (mobility aids) if they are needed. These include:  Canes.  Walkers.  Scooters.  Crutches.  Turn on the lights when you go into a dark area. Replace any light bulbs as soon as they burn out.  Set up your furniture so you have a clear path. Avoid moving your furniture around.  If any of your floors are uneven, fix them.  If there are any pets around you, be aware of where they are.  Review your medicines with your doctor. Some medicines can make you feel dizzy. This can increase your chance of falling. Ask your doctor what other things that you can do to help prevent falls. This information is not intended to replace advice given to you by your health care provider. Make sure you discuss any questions you have with your health care provider. Document Released: 03/28/2009 Document Revised: 11/07/2015 Document Reviewed: 07/06/2014 Elsevier Interactive Patient Education  2017 [employer].

## 2017-02-23 NOTE — Patient Instructions (Signed)
Try to wean off of sucralfate until Halloween. Follow up for your reflux in 4 months.

## 2017-03-11 ENCOUNTER — Other Ambulatory Visit: Payer: Self-pay | Admitting: Family Medicine

## 2017-03-11 DIAGNOSIS — D2271 Melanocytic nevi of right lower limb, including hip: Secondary | ICD-10-CM | POA: Diagnosis not present

## 2017-03-11 DIAGNOSIS — D225 Melanocytic nevi of trunk: Secondary | ICD-10-CM | POA: Diagnosis not present

## 2017-03-11 DIAGNOSIS — D2262 Melanocytic nevi of left upper limb, including shoulder: Secondary | ICD-10-CM | POA: Diagnosis not present

## 2017-03-11 DIAGNOSIS — Z85828 Personal history of other malignant neoplasm of skin: Secondary | ICD-10-CM | POA: Diagnosis not present

## 2017-03-11 DIAGNOSIS — Z1231 Encounter for screening mammogram for malignant neoplasm of breast: Secondary | ICD-10-CM

## 2017-03-15 ENCOUNTER — Other Ambulatory Visit: Payer: Self-pay | Admitting: Family Medicine

## 2017-04-06 ENCOUNTER — Ambulatory Visit
Admission: RE | Admit: 2017-04-06 | Discharge: 2017-04-06 | Disposition: A | Discharge status: Home / Self Care | Payer: Medicare Other | Source: Ambulatory Visit | Attending: Family Medicine | Admitting: Family Medicine

## 2017-04-06 DIAGNOSIS — Z1231 Encounter for screening mammogram for malignant neoplasm of breast: Secondary | ICD-10-CM | POA: Diagnosis not present

## 2017-04-19 ENCOUNTER — Other Ambulatory Visit: Payer: Self-pay | Admitting: Family Medicine

## 2017-04-26 ENCOUNTER — Other Ambulatory Visit: Payer: Self-pay | Admitting: Family Medicine

## 2017-06-15 DIAGNOSIS — R011 Cardiac murmur, unspecified: Secondary | ICD-10-CM

## 2017-06-15 HISTORY — DX: Cardiac murmur, unspecified: R01.1

## 2017-06-24 ENCOUNTER — Telehealth: Payer: Self-pay | Admitting: Family Medicine

## 2017-06-24 ENCOUNTER — Ambulatory Visit (INDEPENDENT_AMBULATORY_CARE_PROVIDER_SITE_OTHER): Payer: Medicare Other | Admitting: Family Medicine

## 2017-06-24 ENCOUNTER — Encounter: Payer: Self-pay | Admitting: Family Medicine

## 2017-06-24 VITALS — BP 170/64 | HR 56 | Temp 98.6°F | Resp 16 | Wt 143.0 lb

## 2017-06-24 DIAGNOSIS — I10 Essential (primary) hypertension: Secondary | ICD-10-CM | POA: Diagnosis not present

## 2017-06-24 DIAGNOSIS — E785 Hyperlipidemia, unspecified: Secondary | ICD-10-CM | POA: Diagnosis not present

## 2017-06-24 DIAGNOSIS — K219 Gastro-esophageal reflux disease without esophagitis: Secondary | ICD-10-CM | POA: Diagnosis not present

## 2017-06-24 NOTE — Telephone Encounter (Signed)
Pt request a call back to discuss dosage for metoprolol tartrate (LOPRESSOR) 25 MG tablet. Pt is concerned that she might be taking more of the medication then is directed on bottle. Please advise. Thanks TNP

## 2017-06-24 NOTE — Progress Notes (Signed)
Patient: Teresa Moyer Female    DOB: December 24, 1935   82 y.o.   MRN: [id number] Visit Date: 06/24/2017  Today's Provider: [name], MD   Chief Complaint  Patient presents with  . Hypertension   Subjective:    HPI  Hypertension, follow-up:  BP Readings from Last 3 Encounters:  06/24/17 (!) 170/64  02/23/17 (!) 142/60  02/23/17 (!) 162/74    She was last seen for hypertension 3 months ago.  BP at that visit was 142/60. Management since that visit includes none. She reports good compliance with treatment. She is not having side effects.  She is exercising but has not since having a cold. She is adherent to low salt diet.   Outside blood pressures are running 140/70's. Patient denies chest pain, chest pressure/discomfort, claudication, dyspnea, exertional chest pressure/discomfort, fatigue, irregular heart beat, lower extremity edema, near-syncope, orthopnea, palpitations, paroxysmal nocturnal dyspnea, syncope and tachypnea.    Wt Readings from Last 3 Encounters:  06/24/17 143 lb (64.9 kg)  02/23/17 148 lb (67.1 kg)  02/23/17 148 lb 6.4 oz (67.3 kg)   ------------------------------------------------------------------------  GERD- pt weaned off of Carafate and is doing well. She is taking Ranitidine daily.    Pt reports that she has had a cold for about a week. She thinks she is getting better but wants it looked at to be sure.   She would like her thyroid checked again she had the life screening done and said her thyroid was enlarged. Dr. Rosanna Randy checked it last OV and did not feel anything.       Allergies  Allergen Reactions  . Accupril  [Quinapril Hcl] Hives and Swelling  . Amoxicillin   . Clinoril  [Sulindac]   . Codeine Sulfate   . Hydrochlorothiazide   . Penicillins   . Sulfa Antibiotics   . Tessalon  [Benzonatate]      Current Outpatient Medications:  .  amLODipine (NORVASC) 5 MG tablet, TAKE 1 TABLET BY MOUTH ONCE DAILY, Disp: 30  tablet, Rfl: 12 .  azelastine (ASTELIN) 0.1 % nasal spray, Place 1 spray into both nostrils daily. , Disp: , Rfl:  .  cetirizine (ZYRTEC ALLERGY) 10 MG tablet, Take 10 mg by mouth daily. , Disp: , Rfl:  .  fenofibrate 160 MG tablet, TAKE 1 TABLET BY MOUTH ONCE DAILY. (Patient taking differently: TAKE 1/2 TABLET BY MOUTH ONCE DAILY.), Disp: 90 tablet, Rfl: 3 .  fluticasone (FLONASE) 50 MCG/ACT nasal spray, Place 1 spray into both nostrils daily. , Disp: , Rfl:  .  furosemide (LASIX) 20 MG tablet, TAKE 1 TABLET BY MOUTH ONCE DAILY, Disp: 30 tablet, Rfl: 11 .  losartan (COZAAR) 100 MG tablet, TAKE 1 TABLET BY MOUTH ONCE DAILY, Disp: 90 tablet, Rfl: 3 .  meloxicam (MOBIC) 7.5 MG tablet, Take 1 tablet (7.5 mg total) by mouth daily as needed. (Patient taking differently: Take 7.5 mg by mouth daily. ), Disp: 60 tablet, Rfl: 5 .  metoprolol tartrate (LOPRESSOR) 25 MG tablet, Take 0.5 tablets (12.5 mg total) by mouth 2 (two) times daily. (Patient taking differently: Take 25 mg by mouth 2 (two) times daily. ), Disp: 60 tablet, Rfl: 12 .  Nutritional Supplements (JOINT FORMULA PO), Take by mouth daily., Disp: , Rfl:  .  Probiotic Product (ALIGN) 4 MG CAPS, Take by mouth daily. , Disp: , Rfl:  .  ranitidine (ZANTAC) 150 MG capsule, Take 1 capsule (150 mg total) by mouth 2 (two) times daily.,  Disp: 60 capsule, Rfl: 11 .  traMADol (ULTRAM) 50 MG tablet, TAKE 1/2 TO 1 TABLET BY MOUTH EVERY 8 HOURS AS NEEDED, Disp: 60 tablet, Rfl: 4 .  VESICARE 5 MG tablet, TAKE 1 TABLET BY MOUTH ONCE DAILY, Disp: 30 tablet, Rfl: 12 .  Magnesium Oxide 400 MG CAPS, Take 1 capsule (400 mg total) by mouth 2 (two) times daily. (Patient not taking: Reported on 02/23/2017), Disp: 60 capsule, Rfl: 11 .  sucralfate (CARAFATE) 1 g tablet, Take 1 tablet (1 g total) by mouth 3 (three) times daily. (Patient not taking: Reported on 06/24/2017), Disp: 90 tablet, Rfl: 11  Review of Systems  Constitutional: Negative.   HENT: Positive for sinus  pressure and sore throat.        Ear fullness   Eyes: Negative.   Respiratory: Negative.   Cardiovascular: Negative.   Gastrointestinal: Negative.   Endocrine: Negative.   Genitourinary: Negative.   Musculoskeletal: Negative.   Skin: Negative.   Allergic/Immunologic: Negative.   Neurological: Negative.   Hematological: Negative.   Psychiatric/Behavioral: Negative.     Social History   Tobacco Use  . Smoking status: Never Smoker  . Smokeless tobacco: Never Used  Substance Use Topics  . Alcohol use: No   Objective:   BP (!) 170/64 (BP Location: Left Arm, Patient Position: Sitting, Cuff Size: Normal)   Pulse (!) 56   Temp 98.6 F (37 C) (Oral)   Resp 16   Wt 143 lb (64.9 kg)   SpO2 95%   BMI 24.55 kg/m  Vitals:   06/24/17 1345  BP: (!) 170/64  Pulse: (!) 56  Resp: 16  Temp: 98.6 F (37 C)  TempSrc: Oral  SpO2: 95%  Weight: 143 lb (64.9 kg)     Physical Exam  Constitutional: She is oriented to person, place, and time. She appears well-developed and well-nourished.  HENT:  Head: Normocephalic and atraumatic.  Right Ear: External ear normal.  Left Ear: External ear normal.  Nose: Nose normal.  Mouth/Throat: Oropharynx is clear and moist.  Eyes: Conjunctivae and EOM are normal. Pupils are equal, round, and reactive to light.  Neck: Normal range of motion. Neck supple.  Cardiovascular: Normal rate, regular rhythm and intact distal pulses.  Murmur (2/6 systolic murmur) heard. Pulmonary/Chest: Effort normal and breath sounds normal.  Abdominal: Soft.  Musculoskeletal: Normal range of motion.  Neurological: She is alert and oriented to person, place, and time. She has normal reflexes.  Skin: Skin is warm and dry.  Psychiatric: She has a normal mood and affect. Her behavior is normal. Judgment and thought content normal.        Assessment & Plan:     1. Essential (primary) hypertension Pt has white coat hypertension. BP Is better at home. (140's/70's)  Follow. - CBC with Differential/Platelet - TSH  2. Gastro-esophageal reflux disease without esophagitis  3. Hyperlipidemia, unspecified hyperlipidemia type  - Lipid panel - Comprehensive metabolic panel     HPI, Exam, and A&P Transcribed under the direction and in the presence of Quavis Klutz L. Cranford Mon, MD  Electronically Signed: Katina Dung, CMA  I have done the exam and reviewed the above chart and it is accurate to the best of my knowledge. Development worker, community has been used in this note in any air is in the dictation or transcription are unintentional.  [name], MD  [place]

## 2017-06-24 NOTE — Telephone Encounter (Signed)
Clarified this with pt. Looks like cardiology increased her Metoprolol to 25 mg BID back in march of 2018 and we noted it, according to this record pt is taking the correct dose of Metoprolol.

## 2017-07-01 DIAGNOSIS — E785 Hyperlipidemia, unspecified: Secondary | ICD-10-CM | POA: Diagnosis not present

## 2017-07-01 DIAGNOSIS — I10 Essential (primary) hypertension: Secondary | ICD-10-CM | POA: Diagnosis not present

## 2017-07-02 LAB — LIPID PANEL
Chol/HDL Ratio: 2.5 ratio (ref 0.0–4.4)
Cholesterol, Total: 167 mg/dL (ref 100–199)
HDL: 66 mg/dL (ref >39)
LDL Calculated: 61 mg/dL (ref 0–99)
Triglycerides: 199 mg/dL — ABNORMAL HIGH (ref 0–149)
VLDL Cholesterol Cal: 40 mg/dL (ref 5–40)

## 2017-07-02 LAB — COMPREHENSIVE METABOLIC PANEL
ALT: 12 IU/L (ref 0–32)
AST: 18 IU/L (ref 0–40)
Albumin/Globulin Ratio: 1.7 (ref 1.2–2.2)
Albumin: 4.4 g/dL (ref 3.5–4.8)
Alkaline Phosphatase: 66 IU/L (ref 39–117)
BUN/Creatinine Ratio: 18 (ref 12–28)
BUN: 15 mg/dL (ref 8–27)
Bilirubin Total: 0.4 mg/dL (ref 0.0–1.2)
CO2: 23 mmol/L (ref 20–29)
Calcium: 9.9 mg/dL (ref 8.7–10.3)
Chloride: 102 mmol/L (ref 96–106)
Creatinine, Ser: 0.85 mg/dL (ref 0.57–1.00)
GFR calc Af Amer: 75 mL/min/{1.73_m2} (ref >59)
GFR calc non Af Amer: 65 mL/min/{1.73_m2} (ref >59)
Globulin, Total: 2.6 g/dL (ref 1.5–4.5)
Glucose: 86 mg/dL (ref 65–99)
Potassium: 4 mmol/L (ref 3.5–5.2)
Sodium: 141 mmol/L (ref 134–144)
Total Protein: 7 g/dL (ref 6.0–8.5)

## 2017-07-02 LAB — TSH: TSH: 2.45 u[IU]/mL (ref 0.450–4.500)

## 2017-07-02 LAB — CBC WITH DIFFERENTIAL/PLATELET
Basophils Absolute: 0.1 10*3/uL (ref 0.0–0.2)
Basos: 1 %
EOS (ABSOLUTE): 0.1 10*3/uL (ref 0.0–0.4)
Eos: 1 %
Hematocrit: 39.5 % (ref 34.0–46.6)
Hemoglobin: 12.9 g/dL (ref 11.1–15.9)
Immature Grans (Abs): 0 10*3/uL (ref 0.0–0.1)
Immature Granulocytes: 0 %
Lymphocytes Absolute: 2.3 10*3/uL (ref 0.7–3.1)
Lymphs: 29 %
MCH: 29.6 pg (ref 26.6–33.0)
MCHC: 32.7 g/dL (ref 31.5–35.7)
MCV: 91 fL (ref 79–97)
Monocytes Absolute: 0.7 10*3/uL (ref 0.1–0.9)
Monocytes: 8 %
Neutrophils Absolute: 4.8 10*3/uL (ref 1.4–7.0)
Neutrophils: 61 %
Platelets: 325 10*3/uL (ref 150–379)
RBC: 4.36 x10E6/uL (ref 3.77–5.28)
RDW: 14.9 % (ref 12.3–15.4)
WBC: 7.9 10*3/uL (ref 3.4–10.8)

## 2017-07-02 NOTE — Progress Notes (Signed)
Advised  ED 

## 2017-07-19 DIAGNOSIS — I10 Essential (primary) hypertension: Secondary | ICD-10-CM | POA: Diagnosis not present

## 2017-07-19 DIAGNOSIS — I350 Nonrheumatic aortic (valve) stenosis: Secondary | ICD-10-CM | POA: Diagnosis not present

## 2017-07-19 DIAGNOSIS — E782 Mixed hyperlipidemia: Secondary | ICD-10-CM | POA: Diagnosis not present

## 2017-07-29 DIAGNOSIS — I350 Nonrheumatic aortic (valve) stenosis: Secondary | ICD-10-CM | POA: Diagnosis not present

## 2017-07-29 DIAGNOSIS — I10 Essential (primary) hypertension: Secondary | ICD-10-CM | POA: Diagnosis not present

## 2017-08-21 ENCOUNTER — Other Ambulatory Visit: Payer: Self-pay | Admitting: Family Medicine

## 2017-08-21 DIAGNOSIS — K219 Gastro-esophageal reflux disease without esophagitis: Secondary | ICD-10-CM

## 2017-08-23 ENCOUNTER — Other Ambulatory Visit: Payer: Self-pay | Admitting: Family Medicine

## 2017-08-23 DIAGNOSIS — K219 Gastro-esophageal reflux disease without esophagitis: Secondary | ICD-10-CM

## 2017-09-01 ENCOUNTER — Other Ambulatory Visit: Payer: Self-pay | Admitting: Family Medicine

## 2017-09-30 ENCOUNTER — Other Ambulatory Visit: Payer: Self-pay | Admitting: Family Medicine

## 2017-11-16 DIAGNOSIS — L570 Actinic keratosis: Secondary | ICD-10-CM | POA: Diagnosis not present

## 2017-11-16 DIAGNOSIS — X32XXXA Exposure to sunlight, initial encounter: Secondary | ICD-10-CM | POA: Diagnosis not present

## 2017-11-16 DIAGNOSIS — D485 Neoplasm of uncertain behavior of skin: Secondary | ICD-10-CM | POA: Diagnosis not present

## 2017-11-16 DIAGNOSIS — S90562A Insect bite (nonvenomous), left ankle, initial encounter: Secondary | ICD-10-CM | POA: Diagnosis not present

## 2017-11-16 DIAGNOSIS — Z85828 Personal history of other malignant neoplasm of skin: Secondary | ICD-10-CM | POA: Diagnosis not present

## 2017-11-16 DIAGNOSIS — Z08 Encounter for follow-up examination after completed treatment for malignant neoplasm: Secondary | ICD-10-CM | POA: Diagnosis not present

## 2017-11-18 DIAGNOSIS — H35372 Puckering of macula, left eye: Secondary | ICD-10-CM | POA: Diagnosis not present

## 2017-11-23 DIAGNOSIS — I10 Essential (primary) hypertension: Secondary | ICD-10-CM | POA: Diagnosis not present

## 2017-11-25 DIAGNOSIS — D485 Neoplasm of uncertain behavior of skin: Secondary | ICD-10-CM | POA: Diagnosis not present

## 2017-11-25 DIAGNOSIS — C44311 Basal cell carcinoma of skin of nose: Secondary | ICD-10-CM | POA: Diagnosis not present

## 2017-12-09 ENCOUNTER — Encounter: Payer: Self-pay | Admitting: *Deleted

## 2017-12-09 ENCOUNTER — Other Ambulatory Visit: Payer: Self-pay

## 2017-12-09 ENCOUNTER — Emergency Department: Payer: Medicare Other

## 2017-12-09 ENCOUNTER — Emergency Department
Admission: EM | Admit: 2017-12-09 | Discharge: 2017-12-09 | Disposition: A | Discharge status: Home / Self Care | Payer: Medicare Other | Attending: Emergency Medicine | Admitting: Emergency Medicine

## 2017-12-09 DIAGNOSIS — Z79899 Other long term (current) drug therapy: Secondary | ICD-10-CM | POA: Insufficient documentation

## 2017-12-09 DIAGNOSIS — R109 Unspecified abdominal pain: Secondary | ICD-10-CM | POA: Diagnosis present

## 2017-12-09 DIAGNOSIS — R1013 Epigastric pain: Secondary | ICD-10-CM | POA: Insufficient documentation

## 2017-12-09 DIAGNOSIS — K8050 Calculus of bile duct without cholangitis or cholecystitis without obstruction: Secondary | ICD-10-CM

## 2017-12-09 DIAGNOSIS — Z96651 Presence of right artificial knee joint: Secondary | ICD-10-CM | POA: Insufficient documentation

## 2017-12-09 DIAGNOSIS — I10 Essential (primary) hypertension: Secondary | ICD-10-CM | POA: Diagnosis not present

## 2017-12-09 DIAGNOSIS — K8020 Calculus of gallbladder without cholecystitis without obstruction: Secondary | ICD-10-CM | POA: Diagnosis not present

## 2017-12-09 LAB — CBC WITH DIFFERENTIAL/PLATELET
Basophils Absolute: 0.1 10*3/uL (ref 0–0.1)
Basophils Relative: 1 %
Eosinophils Absolute: 0 10*3/uL (ref 0–0.7)
Eosinophils Relative: 0 %
HCT: 40.2 % (ref 35.0–47.0)
Hemoglobin: 13.9 g/dL (ref 12.0–16.0)
Lymphocytes Relative: 16 %
Lymphs Abs: 1.7 10*3/uL (ref 1.0–3.6)
MCH: 31.4 pg (ref 26.0–34.0)
MCHC: 34.7 g/dL (ref 32.0–36.0)
MCV: 90.6 fL (ref 80.0–100.0)
Monocytes Absolute: 0.6 10*3/uL (ref 0.2–0.9)
Monocytes Relative: 5 %
Neutro Abs: 8.5 10*3/uL — ABNORMAL HIGH (ref 1.4–6.5)
Neutrophils Relative %: 78 %
Platelets: 332 10*3/uL (ref 150–440)
RBC: 4.43 MIL/uL (ref 3.80–5.20)
RDW: 13.3 % (ref 11.5–14.5)
WBC: 10.8 10*3/uL (ref 3.6–11.0)

## 2017-12-09 LAB — COMPREHENSIVE METABOLIC PANEL
ALT: 16 U/L (ref 0–44)
AST: 26 U/L (ref 15–41)
Albumin: 4.6 g/dL (ref 3.5–5.0)
Alkaline Phosphatase: 58 U/L (ref 38–126)
Anion gap: 15 (ref 5–15)
BUN: 15 mg/dL (ref 8–23)
CO2: 19 mmol/L — ABNORMAL LOW (ref 22–32)
Calcium: 9.8 mg/dL (ref 8.9–10.3)
Chloride: 101 mmol/L (ref 98–111)
Creatinine, Ser: 0.76 mg/dL (ref 0.44–1.00)
GFR calc Af Amer: >60 mL/min (ref >60)
GFR calc non Af Amer: >60 mL/min (ref >60)
Glucose, Bld: 148 mg/dL — ABNORMAL HIGH (ref 70–99)
Potassium: 2.8 mmol/L — ABNORMAL LOW (ref 3.5–5.1)
Sodium: 135 mmol/L (ref 135–145)
Total Bilirubin: 0.7 mg/dL (ref 0.3–1.2)
Total Protein: 7.9 g/dL (ref 6.5–8.1)

## 2017-12-09 LAB — URINALYSIS, COMPLETE (UACMP) WITH MICROSCOPIC
Bacteria, UA: NONE SEEN
Bilirubin Urine: NEGATIVE
Glucose, UA: NEGATIVE mg/dL
Hgb urine dipstick: NEGATIVE
Ketones, ur: NEGATIVE mg/dL
Leukocytes, UA: NEGATIVE
Nitrite: NEGATIVE
Protein, ur: 30 mg/dL — AB
Specific Gravity, Urine: 1.017 (ref 1.005–1.030)
Squamous Epithelial / LPF: NONE SEEN (ref 0–5)
pH: 8 (ref 5.0–8.0)

## 2017-12-09 LAB — LIPASE, BLOOD: Lipase: 30 U/L (ref 11–51)

## 2017-12-09 MED ORDER — ONDANSETRON HCL 4 MG/2ML IJ SOLN
4.0000 mg | Freq: Once | INTRAMUSCULAR | Status: AC
  Administered 2017-12-09: 4 mg via INTRAVENOUS
  Filled 2017-12-09: qty 2

## 2017-12-09 MED ORDER — ONDANSETRON 4 MG PO TBDP: 4.0000 mg | ORAL_TABLET | Freq: Three times a day (TID) | ORAL | 0 refills | Status: DC | PRN

## 2017-12-09 MED ORDER — OXYCODONE-ACETAMINOPHEN 5-325 MG PO TABS: 1.0000 | ORAL_TABLET | Freq: Three times a day (TID) | ORAL | 0 refills | Status: DC | PRN

## 2017-12-09 MED ORDER — MORPHINE SULFATE (PF) 4 MG/ML IV SOLN
4.0000 mg | Freq: Once | INTRAVENOUS | Status: AC
  Administered 2017-12-09: 4 mg via INTRAVENOUS
  Filled 2017-12-09: qty 1

## 2017-12-09 MED ORDER — FENTANYL CITRATE (PF) 100 MCG/2ML IJ SOLN
25.0000 ug | INTRAMUSCULAR | Status: DC | PRN
  Administered 2017-12-09: 25 ug via INTRAVENOUS
  Filled 2017-12-09: qty 2

## 2017-12-09 MED ORDER — IOHEXOL 350 MG/ML SOLN
75.0000 mL | Freq: Once | INTRAVENOUS | Status: AC | PRN
  Administered 2017-12-09: 75 mL via INTRAVENOUS

## 2017-12-09 NOTE — ED Provider Notes (Signed)
Patient is in no distress and currently feeling better.  I think her symptoms likely represent biliary colic.  Labs are otherwise unremarkable, she will be discharged with pain medicine, antiemetics and referred to general surgery for outpatient follow-up.   Earleen Newport, MD 12/09/17 2225
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
## 2017-12-09 NOTE — ED Triage Notes (Signed)
Pt to triage via wheelchair.  Pt has abd pain 1 hour after eating lunch today  Pt also reports vomiting x 2  With abd distention.  Pt alert.

## 2017-12-13 DIAGNOSIS — C4490 Unspecified malignant neoplasm of skin, unspecified: Secondary | ICD-10-CM

## 2017-12-13 DIAGNOSIS — Z6741 Type O blood, Rh negative: Secondary | ICD-10-CM

## 2017-12-13 HISTORY — DX: Type O blood, Rh negative: Z67.41

## 2017-12-13 HISTORY — DX: Unspecified malignant neoplasm of skin, unspecified: C44.90

## 2017-12-14 ENCOUNTER — Ambulatory Visit (INDEPENDENT_AMBULATORY_CARE_PROVIDER_SITE_OTHER): Payer: Medicare Other | Admitting: Family Medicine

## 2017-12-14 VITALS — BP 170/60 | HR 72 | Temp 98.2°F | Resp 16 | Wt 142.0 lb

## 2017-12-14 DIAGNOSIS — K829 Disease of gallbladder, unspecified: Secondary | ICD-10-CM

## 2017-12-14 DIAGNOSIS — R1084 Generalized abdominal pain: Secondary | ICD-10-CM | POA: Diagnosis not present

## 2017-12-14 DIAGNOSIS — E876 Hypokalemia: Secondary | ICD-10-CM

## 2017-12-14 NOTE — Progress Notes (Signed)
Teresa Moyer  MRN: [id number] DOB: [birth year]  Subjective:  HPI  The patient is an 82 year old female who presents today for follow up after being seen in the ER.  She presented there with abdominal bloating, nausea, vomiting and abdominal pain.   They did a CT in the ER and it revealed:   1. No acute findings within the abdomen or pelvis. 2. Gallstone stones as well as gallbladder distension, but no wall thickening or pericholecystic inflammation. Biliary colic is a possible etiology for the patient's episode of pain. 3. There is also intra and extrahepatic bile duct dilation, common bile duct measuring 1 cm diameter with distal tapering. No visualized duct stone. This is likely chronic. However, there are symptoms of biliary obstruction, follow-up ERCP or MRCP would be recommended. 4. Mild aortic atherosclerosis.  She was advised to see her surgeon.  The patient states she tried to make an appointment and she was told that they would have to have a referral.    Patient Active Problem List   Diagnosis Date Noted  . Actinic keratosis 12/18/2014  . Allergic rhinitis 12/18/2014  . Arthritis 12/18/2014  . Basal cell carcinoma of skin 12/18/2014  . Gonalgia 12/18/2014  . Narrowing of intervertebral disc space 12/18/2014  . Essential (primary) hypertension 12/18/2014  . Gastro-esophageal reflux disease without esophagitis 12/18/2014  . HLD (hyperlipidemia) 12/18/2014  . Cardiac murmur 12/18/2014  . Arthritis, degenerative 12/18/2014  . Hypertonicity of bladder 12/18/2014  . Detrusor muscle hypertonia 12/18/2014  . Heart valve disease 12/18/2014  . Asymptomatic varicose veins 12/18/2014    Past Medical History:  Diagnosis Date  . Cataracts, bilateral   . Environmental allergies   . Heart murmur   . Hemorrhoids   . History of chickenpox   . Hypertension   . Incontinence in female   . Osteoarthritis   . Skin cancer    BCC    Social History   Socioeconomic  History  . Marital status: Married    Spouse name: Not on file  . Number of children: Not on file  . Years of education: Not on file  . Highest education level: Not on file  Occupational History  . Not on file  Social Needs  . Financial resource strain: Not on file  . Food insecurity:    Worry: Not on file    Inability: Not on file  . Transportation needs:    Medical: Not on file    Non-medical: Not on file  Tobacco Use  . Smoking status: Never Smoker  . Smokeless tobacco: Never Used  Substance and Sexual Activity  . Alcohol use: No  . Drug use: No  . Sexual activity: Not on file  Lifestyle  . Physical activity:    Days per week: Not on file    Minutes per session: Not on file  . Stress: Not on file  Relationships  . Social connections:    Talks on phone: Not on file    Gets together: Not on file    Attends religious service: Not on file    Active member of club or organization: Not on file    Attends meetings of clubs or organizations: Not on file    Relationship status: Not on file  . Intimate partner violence:    Fear of current or ex partner: Not on file    Emotionally abused: Not on file    Physically abused: Not on file    Forced sexual activity: Not  on file  Other Topics Concern  . Not on file  Social History Narrative  . Not on file    Outpatient Encounter Medications as of 12/14/2017  Medication Sig Note  . amLODipine (NORVASC) 5 MG tablet TAKE 1 TABLET BY MOUTH ONCE DAILY   . azelastine (ASTELIN) 0.1 % nasal spray Place 1 spray into both nostrils daily.  12/18/2014: Received from: [facility]  . carvedilol (COREG) 3.125 MG tablet Take 3.125 mg by mouth 2 (two) times daily with a meal.   . cetirizine (ZYRTEC ALLERGY) 10 MG tablet Take 10 mg by mouth daily.  12/18/2014: Received from: [facility]  . fenofibrate 160 MG tablet TAKE 1 TABLET BY MOUTH ONCE DAILY. (Patient taking differently: TAKE 1/2 TABLET BY MOUTH ONCE DAILY.)     . fluticasone (FLONASE) 50 MCG/ACT nasal spray Place 1 spray into both nostrils daily.  12/18/2014: Received from: [facility]  . furosemide (LASIX) 20 MG tablet TAKE 1 TABLET BY MOUTH ONCE DAILY   . losartan (COZAAR) 100 MG tablet TAKE 1 TABLET BY MOUTH ONCE DAILY   . meloxicam (MOBIC) 7.5 MG tablet TAKE 1 TABLET BY MOUTH ONCE DAILY AS NEEDED   . Nutritional Supplements (JOINT FORMULA PO) Take by mouth daily.   . ondansetron (ZOFRAN ODT) 4 MG disintegrating tablet Take 1 tablet (4 mg total) by mouth every 8 (eight) hours as needed for nausea or vomiting.   Marland Kitchen oxyCODONE-acetaminophen (PERCOCET) 5-325 MG tablet Take 1 tablet by mouth every 8 (eight) hours as needed.   . Probiotic Product (ALIGN) 4 MG CAPS Take by mouth daily.  12/18/2014: Received from: [facility]  . ranitidine (ZANTAC) 150 MG tablet TAKE 1 TABLET BY MOUTH TWICE DAILY   . traMADol (ULTRAM) 50 MG tablet TAKE 1/2 TO 1 TABLET BY MOUTH EVERY 8 HOURS AS NEEDED   . VESICARE 5 MG tablet TAKE 1 TABLET BY MOUTH ONCE DAILY   . [DISCONTINUED] Magnesium Oxide 400 MG CAPS Take 1 capsule (400 mg total) by mouth 2 (two) times daily. (Patient not taking: Reported on 02/23/2017)   . [DISCONTINUED] metoprolol tartrate (LOPRESSOR) 25 MG tablet Take 0.5 tablets (12.5 mg total) by mouth 2 (two) times daily. (Patient taking differently: Take 25 mg by mouth 2 (two) times daily. )   . [DISCONTINUED] ranitidine (ZANTAC) 150 MG tablet TAKE 1 TABLET BY MOUTH TWICE DAILY   . [DISCONTINUED] sucralfate (CARAFATE) 1 g tablet Take 1 tablet (1 g total) by mouth 3 (three) times daily. (Patient not taking: Reported on 06/24/2017)    No facility-administered encounter medications on file as of 12/14/2017.     Allergies  Allergen Reactions  . Accupril  [Quinapril Hcl] Hives and Swelling  . Amoxicillin   . Clinoril  [Sulindac]   . Codeine Sulfate   . Hydrochlorothiazide   . Penicillins   . Sulfa Antibiotics   . Tessalon   [Benzonatate]     Review of Systems  Constitutional: Positive for fever (Saturday).  HENT: Negative.   Eyes: Negative.   Respiratory: Negative for cough, shortness of breath and wheezing.   Cardiovascular: Negative for chest pain, palpitations and leg swelling.  Gastrointestinal: Positive for abdominal pain, constipation, heartburn, nausea and vomiting. Negative for blood in stool, diarrhea and melena.  Skin: Negative.   Endo/Heme/Allergies: Negative.   Psychiatric/Behavioral: Negative.     Objective:  BP (!) 170/60 (BP Location: Right Arm, Patient Position: Sitting, Cuff Size: Normal)   Pulse 72   Temp 98.2 F (  36.8 C) (Oral)   Resp 16   Wt 142 lb (64.4 kg)   BMI 25.97 kg/m   Physical Exam  Constitutional: She is oriented to person, place, and time and well-developed, well-nourished, and in no distress.  HENT:  Head: Normocephalic and atraumatic.  Right Ear: External ear normal.  Left Ear: External ear normal.  Nose: Nose normal.  Eyes: Conjunctivae are normal. No scleral icterus.  Neck: No thyromegaly present.  Cardiovascular: Normal rate, regular rhythm and normal heart sounds.  Pulmonary/Chest: Effort normal and breath sounds normal.  Abdominal: Soft. There is no tenderness.  Neurological: She is alert and oriented to person, place, and time. Gait normal. GCS score is 15.  Skin: Skin is warm and dry.  Psychiatric: Mood, memory, affect and judgment normal.    Assessment and Plan :  1. Gall bladder disease  - Ambulatory referral to General Surgery - CBC with Differential/Platelet - Comprehensive metabolic panel  2. Generalized abdominal pain Resolved for now. - CBC with Differential/Platelet  3. Hypokalemia Check for resolution. - Comprehensive metabolic panel  I have done the exam and reviewed the chart and it is accurate to the best of my knowledge. Development worker, community has been used and  any errors in dictation or transcription are unintentional. Miguel Aschoff M.D. [facility]

## 2017-12-15 ENCOUNTER — Encounter: Payer: Self-pay | Admitting: General Surgery

## 2017-12-15 ENCOUNTER — Ambulatory Visit (INDEPENDENT_AMBULATORY_CARE_PROVIDER_SITE_OTHER): Payer: Medicare Other | Admitting: General Surgery

## 2017-12-15 VITALS — BP 152/70 | HR 75 | Resp 16 | Ht 62.5 in | Wt 141.0 lb

## 2017-12-15 DIAGNOSIS — K8020 Calculus of gallbladder without cholecystitis without obstruction: Secondary | ICD-10-CM

## 2017-12-15 DIAGNOSIS — R140 Abdominal distension (gaseous): Secondary | ICD-10-CM | POA: Diagnosis not present

## 2017-12-15 LAB — CBC WITH DIFFERENTIAL/PLATELET
Basophils Absolute: 0.1 10*3/uL (ref 0.0–0.2)
Basos: 1 %
EOS (ABSOLUTE): 0.1 10*3/uL (ref 0.0–0.4)
Eos: 2 %
Hematocrit: 36.3 % (ref 34.0–46.6)
Hemoglobin: 12 g/dL (ref 11.1–15.9)
Immature Grans (Abs): 0 10*3/uL (ref 0.0–0.1)
Immature Granulocytes: 0 %
Lymphocytes Absolute: 2 10*3/uL (ref 0.7–3.1)
Lymphs: 23 %
MCH: 30.2 pg (ref 26.6–33.0)
MCHC: 33.1 g/dL (ref 31.5–35.7)
MCV: 91 fL (ref 79–97)
Monocytes Absolute: 0.7 10*3/uL (ref 0.1–0.9)
Monocytes: 8 %
Neutrophils Absolute: 5.9 10*3/uL (ref 1.4–7.0)
Neutrophils: 66 %
Platelets: 352 10*3/uL (ref 150–450)
RBC: 3.98 x10E6/uL (ref 3.77–5.28)
RDW: 13.6 % (ref 12.3–15.4)
WBC: 8.8 10*3/uL (ref 3.4–10.8)

## 2017-12-15 LAB — COMPREHENSIVE METABOLIC PANEL
ALT: 22 IU/L (ref 0–32)
AST: 16 IU/L (ref 0–40)
Albumin/Globulin Ratio: 1.4 (ref 1.2–2.2)
Albumin: 3.9 g/dL (ref 3.5–4.7)
Alkaline Phosphatase: 75 IU/L (ref 39–117)
BUN/Creatinine Ratio: 12 (ref 12–28)
BUN: 11 mg/dL (ref 8–27)
Bilirubin Total: 0.3 mg/dL (ref 0.0–1.2)
CO2: 24 mmol/L (ref 20–29)
Calcium: 9.8 mg/dL (ref 8.7–10.3)
Chloride: 98 mmol/L (ref 96–106)
Creatinine, Ser: 0.92 mg/dL (ref 0.57–1.00)
GFR calc Af Amer: 68 mL/min/{1.73_m2} (ref >59)
GFR calc non Af Amer: 59 mL/min/{1.73_m2} — ABNORMAL LOW (ref >59)
Globulin, Total: 2.8 g/dL (ref 1.5–4.5)
Glucose: 87 mg/dL (ref 65–99)
Potassium: 4.3 mmol/L (ref 3.5–5.2)
Sodium: 139 mmol/L (ref 134–144)
Total Protein: 6.7 g/dL (ref 6.0–8.5)
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
## 2017-12-15 NOTE — Patient Instructions (Addendum)
Avoid fatty foods and salads   Laparoscopic Cholecystectomy Laparoscopic cholecystectomy is surgery to remove the gallbladder. The gallbladder is a pear-shaped organ that lies beneath the liver on the right side of the body. The gallbladder stores bile, which is a fluid that helps the body to digest fats. Cholecystectomy is often done for inflammation of the gallbladder (cholecystitis). This condition is usually caused by a buildup of gallstones (cholelithiasis) in the gallbladder. Gallstones can block the flow of bile, which can result in inflammation and pain. In severe cases, emergency surgery may be required. This procedure is done though small incisions in your abdomen (laparoscopic surgery). A thin scope with a camera (laparoscope) is inserted through one incision. Thin surgical instruments are inserted through the other incisions. In some cases, a laparoscopic procedure may be turned into a type of surgery that is done through a larger incision (open surgery). Tell a health care provider about:  Any allergies you have.  All medicines you are taking, including vitamins, herbs, eye drops, creams, and over-the-counter medicines.  Any problems you or family members have had with anesthetic medicines.  Any blood disorders you have.  Any surgeries you have had.  Any medical conditions you have.  Whether you are pregnant or may be pregnant. What are the risks? Generally, this is a safe procedure. However, problems may occur, including:  Infection.  Bleeding.  Allergic reactions to medicines.  Damage to other structures or organs.  A stone remaining in the common bile duct. The common bile duct carries bile from the gallbladder into the small intestine.  A bile leak from the cyst duct that is clipped when your gallbladder is removed.  What happens before the procedure? Staying hydrated Follow instructions from your health care provider about hydration, which may include:  Up to  2 hours before the procedure - you may continue to drink clear liquids, such as water, clear fruit juice, black coffee, and plain tea.  Eating and drinking restrictions Follow instructions from your health care provider about eating and drinking, which may include:  8 hours before the procedure - stop eating heavy meals or foods such as meat, fried foods, or fatty foods.  6 hours before the procedure - stop eating light meals or foods, such as toast or cereal.  6 hours before the procedure - stop drinking milk or drinks that contain milk.  2 hours before the procedure - stop drinking clear liquids.  Medicines  Ask your health care provider about: ? Changing or stopping your regular medicines. This is especially important if you are taking diabetes medicines or blood thinners. ? Taking medicines such as aspirin and ibuprofen. These medicines can thin your blood. Do not take these medicines before your procedure if your health care provider instructs you not to.  You may be given antibiotic medicine to help prevent infection. General instructions  Let your health care provider know if you develop a cold or an infection before surgery.  Plan to have someone take you home from the hospital or clinic.  Ask your health care provider how your surgical site will be marked or identified. What happens during the procedure?  To reduce your risk of infection: ? Your health care team will wash or sanitize their hands. ? Your skin will be washed with soap. ? Hair may be removed from the surgical area.  An IV tube may be inserted into one of your veins.  You will be given one or more of the following: ?  A medicine to help you relax (sedative). ? A medicine to make you fall asleep (general anesthetic).  A breathing tube will be placed in your mouth.  Your surgeon will make several small cuts (incisions) in your abdomen.  The laparoscope will be inserted through one of the small incisions.  The camera on the laparoscope will send images to a TV screen (monitor) in the operating room. This lets your surgeon see inside your abdomen.  Air-like gas will be pumped into your abdomen. This will expand your abdomen to give the surgeon more room to perform the surgery.  Other tools that are needed for the procedure will be inserted through the other incisions. The gallbladder will be removed through one of the incisions.  Your common bile duct may be examined. If stones are found in the common bile duct, they may be removed.  After your gallbladder has been removed, the incisions will be closed with stitches (sutures), staples, or skin glue.  Your incisions may be covered with a bandage (dressing). The procedure may vary among health care providers and hospitals. What happens after the procedure?  Your blood pressure, heart rate, breathing rate, and blood oxygen level will be monitored until the medicines you were given have worn off.  You will be given medicines as needed to control your pain.  Do not drive for 24 hours if you were given a sedative. This information is not intended to replace advice given to you by your health care provider. Make sure you discuss any questions you have with your health care provider. Document Released: 06/01/2005 Document Revised: 12/22/2015 Document Reviewed: 11/18/2015 Elsevier Interactive Patient Education  [employer].   The patient is scheduled for surgery at [facility] on 01/12/18. She will pre admit at the hospital. The patient is aware of date and instructions.

## 2017-12-22 DIAGNOSIS — I10 Essential (primary) hypertension: Secondary | ICD-10-CM | POA: Diagnosis not present

## 2017-12-22 DIAGNOSIS — E782 Mixed hyperlipidemia: Secondary | ICD-10-CM | POA: Diagnosis not present

## 2017-12-22 DIAGNOSIS — I350 Nonrheumatic aortic (valve) stenosis: Secondary | ICD-10-CM | POA: Diagnosis not present

## 2017-12-23 ENCOUNTER — Ambulatory Visit: Payer: Self-pay | Admitting: Family Medicine

## 2017-12-24 ENCOUNTER — Telehealth: Payer: Self-pay

## 2017-12-24 NOTE — Telephone Encounter (Signed)
The patient is aware that she will pre admit at the hospital on 01/06/18 at 11:00 am. She is aware of date, time, and instructions.

## 2017-12-25 ENCOUNTER — Other Ambulatory Visit: Payer: Self-pay | Admitting: Family Medicine

## 2017-12-30 DIAGNOSIS — Z85828 Personal history of other malignant neoplasm of skin: Secondary | ICD-10-CM | POA: Diagnosis not present

## 2017-12-30 DIAGNOSIS — C44311 Basal cell carcinoma of skin of nose: Secondary | ICD-10-CM | POA: Diagnosis not present

## 2018-01-06 ENCOUNTER — Other Ambulatory Visit: Payer: Self-pay

## 2018-01-06 ENCOUNTER — Encounter
Admission: RE | Admit: 2018-01-06 | Discharge: 2018-01-06 | Disposition: A | Discharge status: Home / Self Care | Payer: Medicare Other | Source: Ambulatory Visit | Attending: General Surgery | Admitting: General Surgery

## 2018-01-06 HISTORY — DX: Type O blood, Rh negative: Z67.41

## 2018-01-06 HISTORY — DX: Gastro-esophageal reflux disease without esophagitis: K21.9

## 2018-01-06 NOTE — Patient Instructions (Signed)
Your procedure is scheduled on: Wednesday, January 12, 2018  Report to [place].    DO NOT STOP ON THE FIRST FLOOR TO REGISTER  To find out your arrival time please call [phone] between 1PM - 3PM on Tuesday, July 30,2019  Remember: Instructions that are not followed completely may result in serious medical risk,  up to and including death, or upon the discretion of your surgeon and anesthesiologist your  surgery may need to be rescheduled.     _X__ 1. Do not eat food after midnight the night before your procedure.                 No gum chewing or hard candies.                   You may drink clear liquids up to 2 hours before you are scheduled to arrive for your surgery-                   DO not drink clear liquids within 2 hours of the start of your surgery.                  Clear Liquids include:  water, apple juice without pulp, clear carbohydrate                 drink such as Clearfast of Gatorade, Black Coffee or Tea (Do not add                 anything to coffee or tea).NO DAIRY PRODUCTS, NO LEMON, NO HONEY  __X__2.  On the morning of surgery brush your teeth with toothpaste and water,                  You may rinse your mouth with mouthwash if you wish.                     Do not swallow any toothpaste of mouthwash.     _X__ 3.  No Alcohol for 24 hours before or after surgery.   _X__ 4.  Do Not Smoke or use e-cigarettes For 24 Hours Prior to Your Surgery.                 Do not use any chewable tobacco products for at least 6 hours prior to                 surgery.  ____  5.  Bring all medications with you on the day of surgery if instructed.   ____  6.  Notify your doctor if there is any change in your medical condition      (cold, fever, infections).     Do not wear jewelry, make-up, hairpins, clips or nail polish. Do not wear lotions, powders, or perfumes. You may wear deodorant. Do not shave 48 hours prior to  surgery. Men may shave face and neck. Do not bring valuables to the hospital.    [facility] is not responsible for any belongings or valuables.  Contacts, dentures or bridgework may not be worn into surgery. Leave your suitcase in the car. After surgery it may be brought to your room. For patients admitted to the hospital, discharge time is determined by your treatment team.   Patients discharged the day of surgery will not be allowed to drive home.   Please read over the following fact sheets that you were given:  PREPARING FOR SURGERY   ____ Take these medicines the morning of surgery with A SIP OF WATER:    1. ZANTAC  2. AMLODIPINE  3. CARVEDILOL  4. FLONASE  5. VESICARE  6. TRAMADOL / PERCOCET / TYLENOL (ONLY ONE OR THE OTHER PAIN MEDICATION OR TYLENOL .)                ONLY TAKE IN THE MORNING IF YOU NEED IT.  ____ Fleet Enema (as directed)   __X__ Use CHG Soap as directed  _X___ Stop ALL ASPIRIN PRODUCTS TODAY   _X___ Stop Anti-inflammatories TODAY!!                 THIS INCLUDES IBUPROFEN / MOTRIN /ADVIL / ALEVE AND MOBIC   _X___ Stop supplements until after surgery.                THIS INCLUDES ALIGN / JOINT SUPPLEMENTS / PRESERVISION AREDS  ____ Bring C-Pap to the hospital.   CONTINUE TO TAKE LASIX AS NEEDED BUT DO NOT TAKE ON MORNING OF SURGERY  CONTINUE NIGHT TIME MEDICATIONS AS USUAL.   WEAR LOOSE AND COMFORTABLE CLOTHING TO THE HOSPITAL.  BRING POA PAPERS IF YOU REMEMBER SO WE CAN PUT THEM IN YOUR CHART

## 2018-01-11 MED ORDER — CEFAZOLIN SODIUM-DEXTROSE 2-4 GM/100ML-% IV SOLN
2.0000 g | INTRAVENOUS | Status: AC
  Administered 2018-01-12: 2 g via INTRAVENOUS

## 2018-01-12 ENCOUNTER — Ambulatory Visit: Payer: Medicare Other | Admitting: Certified Registered Nurse Anesthetist

## 2018-01-12 ENCOUNTER — Ambulatory Visit: Payer: Medicare Other

## 2018-01-12 ENCOUNTER — Other Ambulatory Visit: Payer: Self-pay

## 2018-01-12 ENCOUNTER — Encounter: Payer: Self-pay | Admitting: General Surgery

## 2018-01-12 ENCOUNTER — Ambulatory Visit
Admission: RE | Admit: 2018-01-12 | Discharge: 2018-01-12 | Disposition: A | Discharge status: Home / Self Care | Payer: Medicare Other | Source: Ambulatory Visit | Attending: General Surgery | Admitting: General Surgery

## 2018-01-12 ENCOUNTER — Encounter
Admission: RE | Disposition: A | Discharge status: Home / Self Care | Payer: Self-pay | Source: Ambulatory Visit | Attending: General Surgery

## 2018-01-12 DIAGNOSIS — K8012 Calculus of gallbladder with acute and chronic cholecystitis without obstruction: Secondary | ICD-10-CM | POA: Insufficient documentation

## 2018-01-12 DIAGNOSIS — K219 Gastro-esophageal reflux disease without esophagitis: Secondary | ICD-10-CM | POA: Insufficient documentation

## 2018-01-12 DIAGNOSIS — E785 Hyperlipidemia, unspecified: Secondary | ICD-10-CM | POA: Diagnosis not present

## 2018-01-12 DIAGNOSIS — I10 Essential (primary) hypertension: Secondary | ICD-10-CM | POA: Diagnosis not present

## 2018-01-12 DIAGNOSIS — K8020 Calculus of gallbladder without cholecystitis without obstruction: Secondary | ICD-10-CM | POA: Diagnosis not present

## 2018-01-12 DIAGNOSIS — Z79899 Other long term (current) drug therapy: Secondary | ICD-10-CM | POA: Insufficient documentation

## 2018-01-12 DIAGNOSIS — Z419 Encounter for procedure for purposes other than remedying health state, unspecified: Secondary | ICD-10-CM

## 2018-01-12 DIAGNOSIS — K828 Other specified diseases of gallbladder: Secondary | ICD-10-CM | POA: Diagnosis not present

## 2018-01-12 HISTORY — PX: CHOLECYSTECTOMY: SHX55

## 2018-01-12 SURGERY — LAPAROSCOPIC CHOLECYSTECTOMY WITH INTRAOPERATIVE CHOLANGIOGRAM
Anesthesia: General | Wound class: "Clean Contaminated "

## 2018-01-12 MED ORDER — ACETAMINOPHEN 160 MG/5ML PO SOLN
325.0000 mg | ORAL | Status: DC | PRN
  Filled 2018-01-12: qty 20.3

## 2018-01-12 MED ORDER — PHENYLEPHRINE HCL 10 MG/ML IJ SOLN
INTRAMUSCULAR | Status: DC | PRN
  Administered 2018-01-12 (×2): 50 ug via INTRAVENOUS

## 2018-01-12 MED ORDER — SUGAMMADEX SODIUM 200 MG/2ML IV SOLN
INTRAVENOUS | Status: DC | PRN
  Administered 2018-01-12: 125.4 mg via INTRAVENOUS

## 2018-01-12 MED ORDER — FENTANYL CITRATE (PF) 100 MCG/2ML IJ SOLN
INTRAMUSCULAR | Status: AC
  Administered 2018-01-12: 25 ug via INTRAVENOUS
  Filled 2018-01-12: qty 2

## 2018-01-12 MED ORDER — SODIUM CHLORIDE 0.9 % IV SOLN
INTRAVENOUS | Status: DC | PRN
  Administered 2018-01-12: 40 mL

## 2018-01-12 MED ORDER — MEPERIDINE HCL 50 MG/ML IJ SOLN: 6.2500 mg | INTRAMUSCULAR | Status: DC | PRN

## 2018-01-12 MED ORDER — CEFAZOLIN SODIUM-DEXTROSE 2-4 GM/100ML-% IV SOLN
INTRAVENOUS | Status: AC
  Filled 2018-01-12: qty 100

## 2018-01-12 MED ORDER — EPHEDRINE SULFATE 50 MG/ML IJ SOLN
INTRAMUSCULAR | Status: DC | PRN
  Administered 2018-01-12 (×3): 5 mg via INTRAVENOUS

## 2018-01-12 MED ORDER — ACETAMINOPHEN 10 MG/ML IV SOLN
INTRAVENOUS | Status: AC
  Filled 2018-01-12: qty 100

## 2018-01-12 MED ORDER — FENTANYL CITRATE (PF) 100 MCG/2ML IJ SOLN
INTRAMUSCULAR | Status: AC
  Filled 2018-01-12: qty 2

## 2018-01-12 MED ORDER — ROCURONIUM BROMIDE 100 MG/10ML IV SOLN
INTRAVENOUS | Status: DC | PRN
  Administered 2018-01-12: 35 mg via INTRAVENOUS
  Administered 2018-01-12: 5 mg via INTRAVENOUS

## 2018-01-12 MED ORDER — ONDANSETRON HCL 4 MG/2ML IJ SOLN
INTRAMUSCULAR | Status: DC | PRN
  Administered 2018-01-12: 4 mg via INTRAVENOUS

## 2018-01-12 MED ORDER — LACTATED RINGERS IV SOLN: INTRAVENOUS | Status: DC

## 2018-01-12 MED ORDER — ACETAMINOPHEN 325 MG PO TABS: 325.0000 mg | ORAL_TABLET | ORAL | Status: DC | PRN

## 2018-01-12 MED ORDER — DEXAMETHASONE SODIUM PHOSPHATE 10 MG/ML IJ SOLN
INTRAMUSCULAR | Status: DC | PRN
  Administered 2018-01-12: 8 mg via INTRAVENOUS

## 2018-01-12 MED ORDER — HYDROCODONE-ACETAMINOPHEN 7.5-325 MG PO TABS
1.0000 | ORAL_TABLET | Freq: Once | ORAL | Status: DC | PRN
  Filled 2018-01-12: qty 1

## 2018-01-12 MED ORDER — FAMOTIDINE 20 MG PO TABS
ORAL_TABLET | ORAL | Status: AC
  Filled 2018-01-12: qty 1

## 2018-01-12 MED ORDER — TRAMADOL HCL 50 MG PO TABS: 50.0000 mg | ORAL_TABLET | Freq: Four times a day (QID) | ORAL | 0 refills | Status: DC | PRN

## 2018-01-12 MED ORDER — PROPOFOL 10 MG/ML IV BOLUS
INTRAVENOUS | Status: AC
  Filled 2018-01-12: qty 20

## 2018-01-12 MED ORDER — PROPOFOL 10 MG/ML IV BOLUS
INTRAVENOUS | Status: DC | PRN
  Administered 2018-01-12: 20 mg via INTRAVENOUS
  Administered 2018-01-12: 110 mg via INTRAVENOUS

## 2018-01-12 MED ORDER — FENTANYL CITRATE (PF) 100 MCG/2ML IJ SOLN
INTRAMUSCULAR | Status: DC | PRN
  Administered 2018-01-12: 50 ug via INTRAVENOUS

## 2018-01-12 MED ORDER — SODIUM CHLORIDE 0.9 % IJ SOLN
INTRAMUSCULAR | Status: AC
  Filled 2018-01-12: qty 50

## 2018-01-12 MED ORDER — LACTATED RINGERS IV SOLN
INTRAVENOUS | Status: DC | PRN
  Administered 2018-01-12: 12:00:00 via INTRAVENOUS

## 2018-01-12 MED ORDER — LIDOCAINE HCL (CARDIAC) PF 100 MG/5ML IV SOSY
PREFILLED_SYRINGE | INTRAVENOUS | Status: DC | PRN
  Administered 2018-01-12: 60 mg via INTRAVENOUS

## 2018-01-12 MED ORDER — FENTANYL CITRATE (PF) 100 MCG/2ML IJ SOLN
25.0000 ug | INTRAMUSCULAR | Status: DC | PRN
  Administered 2018-01-12 (×2): 25 ug via INTRAVENOUS

## 2018-01-12 MED ORDER — ACETAMINOPHEN 10 MG/ML IV SOLN
INTRAVENOUS | Status: DC | PRN
  Administered 2018-01-12: 1000 mg via INTRAVENOUS

## 2018-01-12 MED ORDER — PROMETHAZINE HCL 25 MG/ML IJ SOLN: 6.2500 mg | INTRAMUSCULAR | Status: DC | PRN

## 2018-01-12 SURGICAL SUPPLY — 40 items
APPLIER CLIP ROT 10 11.4 M/L (STAPLE) ×3
BLADE SURG 11 STRL SS SAFETY (MISCELLANEOUS) ×3 IMPLANT
CANISTER SUCT 1200ML W/VALVE (MISCELLANEOUS) ×3 IMPLANT
CANNULA DILATOR  5MM W/SLV (CANNULA) ×2
CANNULA DILATOR 10 W/SLV (CANNULA) ×2 IMPLANT
CANNULA DILATOR 10MM W/SLV (CANNULA) ×1
CANNULA DILATOR 5 W/SLV (CANNULA) ×4 IMPLANT
CATH CHOLANG 76X19 KUMAR (CATHETERS) ×3 IMPLANT
CHLORAPREP W/TINT 26ML (MISCELLANEOUS) ×3 IMPLANT
CLIP APPLIE ROT 10 11.4 M/L (STAPLE) ×1 IMPLANT
CLOSURE WOUND 1/2 X4 (GAUZE/BANDAGES/DRESSINGS) ×1
CONRAY 60ML FOR OR (MISCELLANEOUS) ×3 IMPLANT
DISSECTOR KITTNER STICK (MISCELLANEOUS) ×1 IMPLANT
DISSECTORS/KITTNER STICK (MISCELLANEOUS)
DRAPE SHEET LG 3/4 BI-LAMINATE (DRAPES) ×3 IMPLANT
DRSG TEGADERM 2-3/8X2-3/4 SM (GAUZE/BANDAGES/DRESSINGS) ×12 IMPLANT
DRSG TELFA 4X3 1S NADH ST (GAUZE/BANDAGES/DRESSINGS) ×3 IMPLANT
ELECT REM PT RETURN 9FT ADLT (ELECTROSURGICAL) ×3
ELECTRODE REM PT RTRN 9FT ADLT (ELECTROSURGICAL) ×1 IMPLANT
GLOVE BIO SURGEON STRL SZ7.5 (GLOVE) ×3 IMPLANT
GLOVE INDICATOR 8.0 STRL GRN (GLOVE) ×3 IMPLANT
GOWN STRL REUS W/ TWL LRG LVL3 (GOWN DISPOSABLE) ×3 IMPLANT
GOWN STRL REUS W/TWL LRG LVL3 (GOWN DISPOSABLE) ×6
IRRIGATION STRYKERFLOW (MISCELLANEOUS) ×1 IMPLANT
IRRIGATOR STRYKERFLOW (MISCELLANEOUS) ×3
IV LACTATED RINGERS 1000ML (IV SOLUTION) ×3 IMPLANT
KIT TURNOVER KIT A (KITS) ×3 IMPLANT
LABEL OR SOLS (LABEL) ×1 IMPLANT
NDL INSUFF ACCESS 14 VERSASTEP (NEEDLE) ×3 IMPLANT
NS IRRIG 500ML POUR BTL (IV SOLUTION) ×3 IMPLANT
PACK LAP CHOLECYSTECTOMY (MISCELLANEOUS) ×3 IMPLANT
POUCH SPECIMEN RETRIEVAL 10MM (ENDOMECHANICALS) IMPLANT
SCISSORS METZENBAUM CVD 33 (INSTRUMENTS) ×3 IMPLANT
STRIP CLOSURE SKIN 1/2X4 (GAUZE/BANDAGES/DRESSINGS) ×2 IMPLANT
SUT VIC AB 0 CT2 27 (SUTURE) ×3 IMPLANT
SUT VIC AB 4-0 FS2 27 (SUTURE) ×3 IMPLANT
SWABSTK COMLB BENZOIN TINCTURE (MISCELLANEOUS) ×3 IMPLANT
TROCAR XCEL NON-BLD 11X100MML (ENDOMECHANICALS) ×3 IMPLANT
TUBING INSUFFLATION (TUBING) ×3 IMPLANT
WATER STERILE IRR 1000ML POUR (IV SOLUTION) ×3 IMPLANT

## 2018-01-12 NOTE — Anesthesia Post-op Follow-up Note (Signed)
Anesthesia QCDR form completed.        

## 2018-01-12 NOTE — Op Note (Signed)
Preoperative diagnosis: Chronic cholecystitis and cholelithiasis.  Postoperative diagnosis: Same.  Operative procedure: Laparoscopic cholecystectomy with intraoperative cholangiograms.  Operating Surgeon: Hervey Ard, MD.  Anesthesia: General endotracheal.  Estimated blood loss: Less than 5 cc.  Medical note: This 82 year old woman has had episodic abdominal pain and imaging showed evidence of calcified gallstones, mildly distended gallbladder and a dilated common bile duct without evidence of common bile duct stone.  No other pathology.  She was offered cholecystectomy.  She received Kefzol prior to the procedure.  Operative note: The patient had SCD stockings for DVT prevention.  After the induction of general endotracheal anesthesia she was placed in Trendelenburg position and the abdomen cleansed with ChloraPrep.  A varies needle was placed with trans-umbilical incision.  After assuring intra-abdominal location with a hanging drop test the abdomen was insufflated with CO2 of 10 mmHg pressure.  A 10 mm Step port was expanded and inspection showed no evidence of injury from initial port placement.  The patient was placed in reverse Trendelenburg position and rolled to the left.  An 11 mm XL port was placed in the epigastrium and 2-5 mm Step ports were placed in the right lateral abdominal wall.  The gallbladder was moderately distended and showed evidence of adhesions of the duodenum to the inferior surface and marked enlargement of the cystic duct lymph node.  The gallbladder was decompressed with a needle catheter.  It was placed on cephalad traction.  The neck of the gallbladder was cleared and the common bile duct visualized and the cystic duct cleared.  A Kumar clamp was placed and fluoroscopic cholangiograms were completed using a total of 40 cc of one half strength Conray 60.  The common bile duct as well as common hepatic duct anright and left hepatic ducts were dilated and initial images  suggested perhaps 2 floating 4 mm stones.  On further irrigation this resolved.  The distal common bile duct was noted to relax and show from flow into the duodenum.  No clear evidence of retained common bile duct stones.  The cystic duct was doubly clipped and divided as was the branches of the cystic artery.  The gallbladder was removed from the liver bed making use of a cautery dissection.  In one area was entered and there is some clear fluid suggestive chronic inflammatory process.  After it was freed completely with good hemostasis it was placed into an Endo Catch bag and then delivered through the umbilical port site.  Inspection from the epigastric site showed evidence of a small band of adhesions below the level of the umbilicus which was left undisturbed.  Otherwise no visible pathology.  The right upper quadrant was irrigated with lactated Ringer solution.  Good hemostasis was noted.  The abdomen was then desufflated and ports removed under direct vision.  The fascia at the umbilicus were closed with a single 0 Vicryl figure-of-eight suture.  Skin incisions were closed with 4-0 Vicryl subcuticular sutures.  Benzoin, Steri-Strips, Telfa and Tegaderm dressings applied.  Patient tolerated the procedure well and was taken to recovery room in stable condition.

## 2018-01-12 NOTE — Anesthesia Procedure Notes (Signed)
Procedure Name: Intubation Performed by: Demetrius Charity, CRNA Pre-anesthesia Checklist: Patient identified, Patient being monitored, Timeout performed, Emergency Drugs available and Suction available Patient Re-evaluated:Patient Re-evaluated prior to induction Oxygen Delivery Method: Circle system utilized Preoxygenation: Pre-oxygenation with 100% oxygen Induction Type: IV induction Ventilation: Mask ventilation without difficulty Laryngoscope Size: Mac and 3 Grade View: Grade II Tube type: Oral Tube size: 7.0 mm Number of attempts: 1 Airway Equipment and Method: Stylet Placement Confirmation: ETT inserted through vocal cords under direct vision,  positive ETCO2 and breath sounds checked- equal and bilateral Secured at: 22 cm Tube secured with: Tape Dental Injury: Teeth and Oropharynx as per pre-operative assessment

## 2018-01-12 NOTE — Transfer of Care (Signed)
Immediate Anesthesia Transfer of Care Note  Patient: Teresa Moyer  Procedure(s) Performed: LAPAROSCOPIC CHOLECYSTECTOMY WITH INTRAOPERATIVE CHOLANGIOGRAM (N/A )  Patient Location: PACU  Anesthesia Type:General  Level of Consciousness: awake, alert  and oriented  Airway & Oxygen Therapy: Patient Spontanous Breathing and Patient connected to nasal cannula oxygen  Post-op Assessment: Report given to RN and Post -op Vital signs reviewed and stable  Post vital signs: stable  Last Vitals:  Vitals Value Taken Time  BP 177/65 01/12/2018  1:59 PM  Temp    Pulse 74 01/12/2018  2:02 PM  Resp 22 01/12/2018  2:02 PM  SpO2 98 % 01/12/2018  2:02 PM  Vitals shown include unvalidated device data.  Last Pain:  Vitals:   01/12/18 1359  TempSrc:   PainSc: (P) Asleep         Complications: No apparent anesthesia complications

## 2018-01-12 NOTE — Discharge Instructions (Signed)
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
## 2018-01-12 NOTE — H&P (Signed)
No change in clinical history or exam. Mild episode of pain last week. For cholecystectomy.

## 2018-01-12 NOTE — Progress Notes (Signed)
Per dr Bary Castilla ancef is fine to use with allergies to penicillin and amoxicillin.

## 2018-01-13 NOTE — Anesthesia Postprocedure Evaluation (Signed)
Anesthesia Post Note  Patient: Teresa Moyer  Procedure(s) Performed: LAPAROSCOPIC CHOLECYSTECTOMY WITH INTRAOPERATIVE CHOLANGIOGRAM (N/A )  Patient location during evaluation: PACU Anesthesia Type: General Level of consciousness: awake and alert Pain management: pain level controlled Vital Signs Assessment: post-procedure vital signs reviewed and stable Respiratory status: spontaneous breathing, nonlabored ventilation, respiratory function stable and patient connected to nasal cannula oxygen Cardiovascular status: blood pressure returned to baseline and stable Postop Assessment: no apparent nausea or vomiting Anesthetic complications: no     Last Vitals:  Vitals:   01/12/18 1523 01/12/18 1533  BP: (!) 166/54 (!) 170/53  Pulse: 73 71  Resp: 16   Temp: (!) 36.4 C   SpO2: 93%     Last Pain:  Vitals:   01/12/18 1523  TempSrc:   PainSc: 5                  Tylisha Danis Harvie Heck

## 2018-01-14 LAB — SURGICAL PATHOLOGY

## 2018-01-20 ENCOUNTER — Ambulatory Visit (INDEPENDENT_AMBULATORY_CARE_PROVIDER_SITE_OTHER): Payer: Medicare Other | Admitting: General Surgery

## 2018-01-20 ENCOUNTER — Encounter: Payer: Self-pay | Admitting: General Surgery

## 2018-01-20 VITALS — BP 112/70 | HR 68 | Resp 13 | Ht 65.0 in | Wt 143.0 lb

## 2018-01-20 DIAGNOSIS — K812 Acute cholecystitis with chronic cholecystitis: Secondary | ICD-10-CM | POA: Diagnosis not present

## 2018-01-20 NOTE — Patient Instructions (Addendum)
The patient is aware to use a heating pad as needed for comfort.The patient is aware to call back for any questions or concerns. Return I none month.
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
## 2018-01-25 ENCOUNTER — Ambulatory Visit (INDEPENDENT_AMBULATORY_CARE_PROVIDER_SITE_OTHER): Payer: Medicare Other | Admitting: Family Medicine

## 2018-01-25 ENCOUNTER — Encounter: Payer: Self-pay | Admitting: Family Medicine

## 2018-01-25 VITALS — BP 138/72 | HR 68 | Temp 98.5°F | Resp 16 | Ht 63.0 in | Wt 137.0 lb

## 2018-01-25 DIAGNOSIS — I10 Essential (primary) hypertension: Secondary | ICD-10-CM

## 2018-01-25 DIAGNOSIS — Z9889 Other specified postprocedural states: Secondary | ICD-10-CM | POA: Diagnosis not present

## 2018-01-25 DIAGNOSIS — R197 Diarrhea, unspecified: Secondary | ICD-10-CM | POA: Diagnosis not present

## 2018-01-25 NOTE — Progress Notes (Signed)
Patient: Teresa Moyer Female    DOB: 1935/10/15   82 y.o.   MRN: [id number] Visit Date: 01/25/2018  Today's Provider: [name], MD   Chief Complaint  Patient presents with  . Follow-up  . Hypertension   Subjective:    HPI Patient comes in today for a follow up on chronic issues. She was last seen in the office 1 month ago. Since last visit, patient has had gallbladder surgery. She tolerated the surgery well, and she has a follow up scheduled with Dr. Bary Castilla next month.  Patient has also seen the cardiologist since her last visit, and she reports that she now takes carvediol 6.25mg  daily. She reports that she is tolerating the medication well. BP Readings from Last 3 Encounters:  01/25/18 138/72  01/20/18 112/70  01/12/18 (!) 170/53       Allergies  Allergen Reactions  . Accupril [Quinapril Hcl] Hives and Swelling    SWELLING OF NOSE/FACE  . Sulfa Antibiotics Shortness Of Breath and Swelling  . Clinoril [Sulindac] Other (See Comments)    Unsure of reaction type  . Hydrochlorothiazide Other (See Comments)    Unsure of reaction type  . Tessalon [Benzonatate] Other (See Comments)    Unsure of reaction type  . Amoxicillin Rash    Has patient had a PCN reaction causing immediate rash, facial/tongue/throat swelling, SOB or lightheadedness with hypotension: Unknown Has patient had a PCN reaction causing severe rash involving mucus membranes or skin necrosis: Unknown Has patient had a PCN reaction that required hospitalization: No Has patient had a PCN reaction occurring within the last 10 years:Possibly unsure  If all of the above answers are "NO", then may proceed with Cephalosporin use.   . Codeine Sulfate Nausea And Vomiting  . Penicillins Rash    Has patient had a PCN reaction causing immediate rash, facial/tongue/throat swelling, SOB or lightheadedness with hypotension: Unknown Has patient had a PCN reaction causing severe rash involving mucus  membranes or skin necrosis: Unknown Has patient had a PCN reaction that required hospitalization: No Has patient had a PCN reaction occurring within the last 10 years:Possibly unsure  If all of the above answers are "NO", then may proceed with Cephalosporin use.     Current Outpatient Medications:  .  acetaminophen (TYLENOL) 650 MG CR tablet, Take 650 mg by mouth every 8 (eight) hours as needed for pain., Disp: , Rfl:  .  amLODipine (NORVASC) 5 MG tablet, TAKE 1 TABLET BY MOUTH ONCE DAILY, Disp: 30 tablet, Rfl: 12 .  azelastine (ASTELIN) 0.1 % nasal spray, Place 1 spray into both nostrils at bedtime. , Disp: , Rfl:  .  Carboxymethylcell-Hypromellose (GENTEAL) 0.25-0.3 % GEL, Apply 1 application to eye at bedtime., Disp: , Rfl:  .  carvedilol (COREG) 3.125 MG tablet, Take 6.25 mg by mouth 2 (two) times daily with a meal., Disp: , Rfl:  .  cetirizine (ZYRTEC ALLERGY) 10 MG tablet, Take 10 mg by mouth at bedtime. , Disp: , Rfl:  .  Emollient (AQUAPHOR EX), Apply 1 application topically daily. Clean wound with peroxide and apply ointment to affected area of nose., Disp: , Rfl:  .  fenofibrate 160 MG tablet, TAKE 1 TABLET BY MOUTH ONCE DAILY. (Patient taking differently: TAKE 0.5 TABLET (80 MG) BY MOUTH ONCE DAILY AT NIGHT.), Disp: 90 tablet, Rfl: 3 .  fluticasone (FLONASE) 50 MCG/ACT nasal spray, Place 1 spray into both nostrils daily. , Disp: , Rfl:  .  furosemide (  LASIX) 20 MG tablet, TAKE 1 TABLET BY MOUTH ONCE DAILY, Disp: 30 tablet, Rfl: 11 .  losartan (COZAAR) 100 MG tablet, TAKE 1 TABLET BY MOUTH ONCE DAILY, Disp: 90 tablet, Rfl: 3 .  meloxicam (MOBIC) 7.5 MG tablet, TAKE 1 TABLET BY MOUTH ONCE DAILY AS NEEDED (Patient taking differently: TAKE 1 TABLET BY MOUTH ONCE DAILY AS NEEDED FOR PAIN.), Disp: 60 tablet, Rfl: 5 .  Multiple Vitamins-Minerals (PRESERVISION AREDS PO), Take 1 tablet by mouth 2 (two) times daily. , Disp: , Rfl:  .  Nutritional Supplements (JOINT FORMULA PO), Take 1 capsule  by mouth daily before breakfast. Arthrozene, Disp: , Rfl:  .  ondansetron (ZOFRAN ODT) 4 MG disintegrating tablet, Take 1 tablet (4 mg total) by mouth every 8 (eight) hours as needed for nausea or vomiting., Disp: 20 tablet, Rfl: 0 .  oxyCODONE-acetaminophen (PERCOCET) 5-325 MG tablet, Take 1 tablet by mouth every 8 (eight) hours as needed. (Patient taking differently: Take 1 tablet by mouth every 8 (eight) hours as needed (for pain.). ), Disp: 20 tablet, Rfl: 0 .  Polyethyl Glycol-Propyl Glycol (SYSTANE) 0.4-0.3 % SOLN, Place 1 drop into both eyes 3 (three) times daily as needed (for dry eyes)., Disp: , Rfl:  .  Probiotic Product (ALIGN) 4 MG CAPS, Take 4 mg by mouth daily after lunch. , Disp: , Rfl:  .  ranitidine (ZANTAC) 150 MG tablet, TAKE 1 TABLET BY MOUTH TWICE DAILY (Patient taking differently: TAKE 1 TABLET BY MOUTH TWICE DAILY (BEFORE BREAKFAST & BEFORE SUPPER)), Disp: 60 tablet, Rfl: 12 .  telmisartan (MICARDIS) 80 MG tablet, Take 80 mg by mouth at bedtime., Disp: , Rfl:  .  traMADol (ULTRAM) 50 MG tablet, TAKE 1/2 TO 1 TABLET BY MOUTH EVERY 8 HOURS AS NEEDED (Patient taking differently: TAKE 0.5 TABLET (25 MG) BY MOUTH ONCE DAILY AT NIGHT.), Disp: 60 tablet, Rfl: 3 .  traMADol (ULTRAM) 50 MG tablet, Take 1 tablet (50 mg total) by mouth every 6 (six) hours as needed., Disp: 20 tablet, Rfl: 0 .  VESICARE 5 MG tablet, TAKE 1 TABLET BY MOUTH ONCE DAILY, Disp: 30 tablet, Rfl: 12 .  doxycycline (VIBRAMYCIN) 100 MG capsule, Take 100 mg by mouth 2 (two) times daily., Disp: , Rfl:   Review of Systems  Constitutional: Negative.   Respiratory: Negative.   Cardiovascular: Negative.   Endocrine: Negative.   Musculoskeletal: Negative.   Neurological: Negative.   Psychiatric/Behavioral: Negative.     Social History   Tobacco Use  . Smoking status: Never Smoker  . Smokeless tobacco: Never Used  Substance Use Topics  . Alcohol use: No   Objective:   BP 138/72 (BP Location: Left Arm, Patient  Position: Sitting, Cuff Size: Normal)   Pulse 68   Temp 98.5 F (36.9 C)   Resp 16   Ht 5\' 3"  (1.6 m)   Wt 137 lb (62.1 kg)   SpO2 97%   BMI 24.27 kg/m  Vitals:   01/25/18 1600  BP: 138/72  Pulse: 68  Resp: 16  Temp: 98.5 F (36.9 C)  SpO2: 97%  Weight: 137 lb (62.1 kg)  Height: 5\' 3"  (1.6 m)     Physical Exam  Constitutional: She is oriented to person, place, and time. She appears well-developed and well-nourished.  HENT:  Head: Normocephalic and atraumatic.  Right Ear: External ear normal.  Left Ear: External ear normal.  Nose: Nose normal.  Eyes: Conjunctivae are normal. Left eye exhibits no discharge.  Neck: No thyromegaly present.  Cardiovascular: Normal rate, regular rhythm and normal heart sounds.  Pulmonary/Chest: Effort normal and breath sounds normal.  Abdominal: Soft.  Musculoskeletal: She exhibits no edema.  Neurological: She is alert and oriented to person, place, and time.  Skin: Skin is warm and dry.  Psychiatric: She has a normal mood and affect. Her behavior is normal. Judgment and thought content normal.        Assessment & Plan:     S/p Cholecystectomy Consider Questran for Diarrhea if needed. HTN RTC 6 months.     I have done the exam and reviewed the above chart and it is accurate to the best of my knowledge. Development worker, community has been used in this note in any air is in the dictation or transcription are unintentional.  [name], MD  [place]

## 2018-02-02 ENCOUNTER — Telehealth: Payer: Self-pay | Admitting: General Surgery

## 2018-02-02 NOTE — Telephone Encounter (Signed)
Patient had gb sx by DR Bary Castilla  01-12-18. She is experiencing nausea and wanted to talk with a nurse. She has reflux and takes Zantac 150 mg in am & before dinner in p. Please call [phone].

## 2018-02-02 NOTE — Telephone Encounter (Signed)
She states she gets nauseated late in the evening and feels she has had more nasal drainage this week. Denies vomiting, bowels are moving daily, no pain. She does admit to allergies and more nasal drainage. She will change the time she takes her allergy pill and see if that helps, she will also call Dr Rosanna Randy office to see if she needs to change her medication in any way.

## 2018-02-10 ENCOUNTER — Telehealth: Payer: Self-pay | Admitting: General Surgery

## 2018-02-10 NOTE — Telephone Encounter (Signed)
Patient is calling and complaining of not having a bowel movement since Monday, patient said she has taken some stool softener . Patient denies nausea and vomiting. Please call patient and advise.

## 2018-02-10 NOTE — Telephone Encounter (Signed)
Spoke with patient and she says that she had a normal bowel movement on Monday(02/07/18). She was concerned since she has not had one since. She has taken a couple of Dulcolax tablets with no results. She denies any nausea or abdominal pain. Patient advised to start Miralax, 1 capful daily in a full glass of water and to increase her fluids.

## 2018-02-16 ENCOUNTER — Encounter: Payer: Self-pay | Admitting: General Surgery

## 2018-02-16 ENCOUNTER — Ambulatory Visit (INDEPENDENT_AMBULATORY_CARE_PROVIDER_SITE_OTHER): Payer: Medicare Other | Admitting: General Surgery

## 2018-02-16 VITALS — BP 196/71 | HR 67 | Temp 97.7°F | Resp 18 | Ht 62.3 in | Wt 135.6 lb

## 2018-02-16 DIAGNOSIS — K812 Acute cholecystitis with chronic cholecystitis: Secondary | ICD-10-CM | POA: Insufficient documentation

## 2018-02-16 NOTE — Patient Instructions (Addendum)
   The patient is aware to use a heating pad as needed for comfort. The patient is aware to call back for any questions or concerns. Continue Miralax as needed. May continue gym activities.
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
## 2018-02-17 ENCOUNTER — Ambulatory Visit: Payer: Medicare Other | Admitting: General Surgery

## 2018-02-21 DIAGNOSIS — J301 Allergic rhinitis due to pollen: Secondary | ICD-10-CM | POA: Diagnosis not present

## 2018-02-21 DIAGNOSIS — H6123 Impacted cerumen, bilateral: Secondary | ICD-10-CM | POA: Diagnosis not present

## 2018-03-05 ENCOUNTER — Other Ambulatory Visit: Payer: Self-pay | Admitting: Family Medicine

## 2018-03-16 ENCOUNTER — Ambulatory Visit (INDEPENDENT_AMBULATORY_CARE_PROVIDER_SITE_OTHER): Payer: Medicare Other

## 2018-03-16 DIAGNOSIS — Z23 Encounter for immunization: Secondary | ICD-10-CM | POA: Diagnosis not present

## 2018-03-18 ENCOUNTER — Other Ambulatory Visit: Payer: Self-pay | Admitting: Family Medicine

## 2018-04-12 ENCOUNTER — Ambulatory Visit (INDEPENDENT_AMBULATORY_CARE_PROVIDER_SITE_OTHER): Payer: Medicare Other

## 2018-04-12 VITALS — BP 160/68 | HR 67 | Temp 98.9°F | Ht 62.0 in | Wt 139.4 lb

## 2018-04-12 DIAGNOSIS — Z0000 Encounter for general adult medical examination without abnormal findings: Secondary | ICD-10-CM

## 2018-04-12 NOTE — Patient Instructions (Signed)
Teresa Moyer , Thank you for taking time to come for your Medicare Wellness Visit. I appreciate your ongoing commitment to your health goals. Please review the following plan we discussed and let me know if I can assist you in the future.   Screening recommendations/referrals: Colonoscopy: Up to date Mammogram: Up to date Bone Density: Up to date Recommended yearly ophthalmology/optometry visit for glaucoma screening and checkup Recommended yearly dental visit for hygiene and checkup  Vaccinations: Influenza vaccine: Up to date Pneumococcal vaccine: Up to date Tdap vaccine: Pt declines today.  Shingles vaccine: Pt declines today.     Advanced directives: On file.   Conditions/risks identified: Fall risk prevention; Continue increasing water intake to 6-8 glasses a day.  Next appointment: 05/23/18 @ 2 PM with Dr Rosanna Randy.    Preventive Care 19 Years and Older, Female Preventive care refers to lifestyle choices and visits with your health care provider that can promote health and wellness. What does preventive care include?  A yearly physical exam. This is also called an annual well check.  Dental exams once or twice a year.  Routine eye exams. Ask your health care provider how often you should have your eyes checked.  Personal lifestyle choices, including:  Daily care of your teeth and gums.  Regular physical activity.  Eating a healthy diet.  Avoiding tobacco and drug use.  Limiting alcohol use.  Practicing safe sex.  Taking low-dose aspirin every day.  Taking vitamin and mineral supplements as recommended by your health care provider. What happens during an annual well check? The services and screenings done by your health care provider during your annual well check will depend on your age, overall health, lifestyle risk factors, and family history of disease. Counseling  Your health care provider may ask you questions about your:  Alcohol use.  Tobacco  use.  Drug use.  Emotional well-being.  Home and relationship well-being.  Sexual activity.  Eating habits.  History of falls.  Memory and ability to understand (cognition).  Work and work Statistician.  Reproductive health. Screening  You may have the following tests or measurements:  Height, weight, and BMI.  Blood pressure.  Lipid and cholesterol levels. These may be checked every 5 years, or more frequently if you are over 51 years old.  Skin check.  Lung cancer screening. You may have this screening every year starting at age 52 if you have a 30-pack-year history of smoking and currently smoke or have quit within the past 15 years.  Fecal occult blood test (FOBT) of the stool. You may have this test every year starting at age 1.  Flexible sigmoidoscopy or colonoscopy. You may have a sigmoidoscopy every 5 years or a colonoscopy every 10 years starting at age 48.  Hepatitis C blood test.  Hepatitis B blood test.  Sexually transmitted disease (STD) testing.  Diabetes screening. This is done by checking your blood sugar (glucose) after you have not eaten for a while (fasting). You may have this done every 1-3 years.  Bone density scan. This is done to screen for osteoporosis. You may have this done starting at age 17.  Mammogram. This may be done every 1-2 years. Talk to your health care provider about how often you should have regular mammograms. Talk with your health care provider about your test results, treatment options, and if necessary, the need for more tests. Vaccines  Your health care provider may recommend certain vaccines, such as:  Influenza vaccine. This is recommended every  year.  Tetanus, diphtheria, and acellular pertussis (Tdap, Td) vaccine. You may need a Td booster every 10 years.  Zoster vaccine. You may need this after age 14.  Pneumococcal 13-valent conjugate (PCV13) vaccine. One dose is recommended after age 26.  Pneumococcal  polysaccharide (PPSV23) vaccine. One dose is recommended after age 82. Talk to your health care provider about which screenings and vaccines you need and how often you need them. This information is not intended to replace advice given to you by your health care provider. Make sure you discuss any questions you have with your health care provider. Document Released: 06/28/2015 Document Revised: 02/19/2016 Document Reviewed: 04/02/2015 Elsevier Interactive Patient Education  2017 [place] Prevention in the Home Falls can cause injuries. They can happen to people of all ages. There are many things you can do to make your home safe and to help prevent falls. What can I do on the outside of my home?  Regularly fix the edges of walkways and driveways and fix any cracks.  Remove anything that might make you trip as you walk through a door, such as a raised step or threshold.  Trim any bushes or trees on the path to your home.  Use bright outdoor lighting.  Clear any walking paths of anything that might make someone trip, such as rocks or tools.  Regularly check to see if handrails are loose or broken. Make sure that both sides of any steps have handrails.  Any raised decks and porches should have guardrails on the edges.  Have any leaves, snow, or ice cleared regularly.  Use sand or salt on walking paths during winter.  Clean up any spills in your garage right away. This includes oil or grease spills. What can I do in the bathroom?  Use night lights.  Install grab bars by the toilet and in the tub and shower. Do not use towel bars as grab bars.  Use non-skid mats or decals in the tub or shower.  If you need to sit down in the shower, use a plastic, non-slip stool.  Keep the floor dry. Clean up any water that spills on the floor as soon as it happens.  Remove soap buildup in the tub or shower regularly.  Attach bath mats securely with double-sided non-slip rug  tape.  Do not have throw rugs and other things on the floor that can make you trip. What can I do in the bedroom?  Use night lights.  Make sure that you have a light by your bed that is easy to reach.  Do not use any sheets or blankets that are too big for your bed. They should not hang down onto the floor.  Have a firm chair that has side arms. You can use this for support while you get dressed.  Do not have throw rugs and other things on the floor that can make you trip. What can I do in the kitchen?  Clean up any spills right away.  Avoid walking on wet floors.  Keep items that you use a lot in easy-to-reach places.  If you need to reach something above you, use a strong step stool that has a grab bar.  Keep electrical cords out of the way.  Do not use floor polish or wax that makes floors slippery. If you must use wax, use non-skid floor wax.  Do not have throw rugs and other things on the floor that can make you trip. What can  I do with my stairs?  Do not leave any items on the stairs.  Make sure that there are handrails on both sides of the stairs and use them. Fix handrails that are broken or loose. Make sure that handrails are as long as the stairways.  Check any carpeting to make sure that it is firmly attached to the stairs. Fix any carpet that is loose or worn.  Avoid having throw rugs at the top or bottom of the stairs. If you do have throw rugs, attach them to the floor with carpet tape.  Make sure that you have a light switch at the top of the stairs and the bottom of the stairs. If you do not have them, ask someone to add them for you. What else can I do to help prevent falls?  Wear shoes that:  Do not have high heels.  Have rubber bottoms.  Are comfortable and fit you well.  Are closed at the toe. Do not wear sandals.  If you use a stepladder:  Make sure that it is fully opened. Do not climb a closed stepladder.  Make sure that both sides of the  stepladder are locked into place.  Ask someone to hold it for you, if possible.  Clearly mark and make sure that you can see:  Any grab bars or handrails.  First and last steps.  Where the edge of each step is.  Use tools that help you move around (mobility aids) if they are needed. These include:  Canes.  Walkers.  Scooters.  Crutches.  Turn on the lights when you go into a dark area. Replace any light bulbs as soon as they burn out.  Set up your furniture so you have a clear path. Avoid moving your furniture around.  If any of your floors are uneven, fix them.  If there are any pets around you, be aware of where they are.  Review your medicines with your doctor. Some medicines can make you feel dizzy. This can increase your chance of falling. Ask your doctor what other things that you can do to help prevent falls. This information is not intended to replace advice given to you by your health care provider. Make sure you discuss any questions you have with your health care provider. Document Released: 03/28/2009 Document Revised: 11/07/2015 Document Reviewed: 07/06/2014 Elsevier Interactive Patient Education  2017 [employer].
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
## 2018-04-25 ENCOUNTER — Other Ambulatory Visit: Payer: Self-pay | Admitting: Family Medicine

## 2018-05-18 DIAGNOSIS — I119 Hypertensive heart disease without heart failure: Secondary | ICD-10-CM | POA: Diagnosis not present

## 2018-05-18 DIAGNOSIS — I340 Nonrheumatic mitral (valve) insufficiency: Secondary | ICD-10-CM | POA: Diagnosis not present

## 2018-05-18 DIAGNOSIS — E782 Mixed hyperlipidemia: Secondary | ICD-10-CM | POA: Diagnosis not present

## 2018-05-18 DIAGNOSIS — I350 Nonrheumatic aortic (valve) stenosis: Secondary | ICD-10-CM | POA: Diagnosis not present

## 2018-05-18 DIAGNOSIS — I10 Essential (primary) hypertension: Secondary | ICD-10-CM | POA: Diagnosis not present

## 2018-05-23 ENCOUNTER — Ambulatory Visit (INDEPENDENT_AMBULATORY_CARE_PROVIDER_SITE_OTHER): Payer: Medicare Other | Admitting: Family Medicine

## 2018-05-23 VITALS — BP 205/76 | HR 67 | Temp 97.8°F | Resp 16 | Ht 62.0 in | Wt 139.0 lb

## 2018-05-23 DIAGNOSIS — E785 Hyperlipidemia, unspecified: Secondary | ICD-10-CM | POA: Diagnosis not present

## 2018-05-23 DIAGNOSIS — N3281 Overactive bladder: Secondary | ICD-10-CM | POA: Diagnosis not present

## 2018-05-23 DIAGNOSIS — N318 Other neuromuscular dysfunction of bladder: Secondary | ICD-10-CM

## 2018-05-23 DIAGNOSIS — M1990 Unspecified osteoarthritis, unspecified site: Secondary | ICD-10-CM | POA: Diagnosis not present

## 2018-05-23 DIAGNOSIS — K219 Gastro-esophageal reflux disease without esophagitis: Secondary | ICD-10-CM | POA: Diagnosis not present

## 2018-05-23 DIAGNOSIS — I10 Essential (primary) hypertension: Secondary | ICD-10-CM

## 2018-05-23 NOTE — Patient Instructions (Signed)
[employer], then stop once completed.   Discontinue Zantac. Start Pepcid 20mg  twice daily instead.

## 2018-05-23 NOTE — Progress Notes (Signed)
Teresa Moyer  MRN: [id number] DOB: 1935/10/20  Subjective:  HPI   The patient is an 82 year old female who presents for follow up of chronic health.  She was last seen on  04/12/18 by the nurse health advisor for her Medicare wellness exam.    1. Essential (primary) hypertension Blood pressure at home readings are 130-140/60-70 according to patient.  She admits to occasional headaches. BP Readings from Last 3 Encounters:  05/23/18 (!) 205/76  04/12/18 (!) 160/68  02/16/18 (!) 196/71   2. Gastro-esophageal reflux disease without esophagitis Lab Results  Component Value Date   WBC 8.8 12/14/2017   HGB 12.0 12/14/2017   HCT 36.3 12/14/2017   MCV 91 12/14/2017   PLT 352 12/14/2017   3. Arthritis   4. Hyperlipidemia, unspecified hyperlipidemia type Lab Results  Component Value Date   CHOL 167 07/01/2017   HDL 66 07/01/2017   LDLCALC 61 07/01/2017   TRIG 199 (H) 07/01/2017   CHOLHDL 2.5 07/01/2017    Patient Active Problem List   Diagnosis Date Noted  . Acute on chronic cholecystitis 02/16/2018  . Gallstones 12/15/2017  . Actinic keratosis 12/18/2014  . Allergic rhinitis 12/18/2014  . Arthritis 12/18/2014  . Basal cell carcinoma of skin 12/18/2014  . Gonalgia 12/18/2014  . Narrowing of intervertebral disc space 12/18/2014  . Essential (primary) hypertension 12/18/2014  . Gastro-esophageal reflux disease without esophagitis 12/18/2014  . HLD (hyperlipidemia) 12/18/2014  . Cardiac murmur 12/18/2014  . Arthritis, degenerative 12/18/2014  . Hypertonicity of bladder 12/18/2014  . Detrusor muscle hypertonia 12/18/2014  . Heart valve disease 12/18/2014  . Asymptomatic varicose veins 12/18/2014    Past Medical History:  Diagnosis Date  . Cataracts, bilateral   . Environmental allergies   . GERD (gastroesophageal reflux disease)   . Heart murmur 2019   had since a child. dr. Nehemiah Massed follows d/t getting louder  . Hemorrhoids   . History of chickenpox   .  Hypertension   . Incontinence in female   . Osteoarthritis   . Skin cancer 12/2017   BCC. nose removed from nose last week.  shoulder squamos cell removed previously  . Type O blood, Rh negative 12/2017   patient requests that this be present on her chart    Social History   Socioeconomic History  . Marital status: Married    Spouse name: Not on file  . Number of children: 2  . Years of education: Not on file  . Highest education level: Bachelor's degree (e.g., BA, AB, BS)  Occupational History  . Not on file  Social Needs  . Financial resource strain: Not hard at all  . Food insecurity:    Worry: Never true    Inability: Never true  . Transportation needs:    Medical: No    Non-medical: No  Tobacco Use  . Smoking status: Never Smoker  . Smokeless tobacco: Never Used  Substance and Sexual Activity  . Alcohol use: No  . Drug use: No  . Sexual activity: Not Currently  Lifestyle  . Physical activity:    Days per week: 2 days    Minutes per session: 40 min  . Stress: Only a little  Relationships  . Social connections:    Talks on phone: Patient refused    Gets together: Patient refused    Attends religious service: Patient refused    Active member of club or organization: Patient refused    Attends meetings of clubs or organizations: Patient  refused    Relationship status: Patient refused  . Intimate partner violence:    Fear of current or ex partner: Patient refused    Emotionally abused: Patient refused    Physically abused: Patient refused    Forced sexual activity: Patient refused  Other Topics Concern  . Not on file  Social History Narrative  . Not on file    Outpatient Encounter Medications as of 05/23/2018  Medication Sig Note  . acetaminophen (TYLENOL) 650 MG CR tablet Take 650 mg by mouth every 8 (eight) hours as needed for pain.   Marland Kitchen amLODipine (NORVASC) 5 MG tablet TAKE 1 TABLET BY MOUTH ONCE DAILY   . azelastine (ASTELIN) 0.1 % nasal spray Place 1  spray into both nostrils at bedtime.    . Carboxymethylcell-Hypromellose (GENTEAL) 0.25-0.3 % GEL Apply 1 application to eye at bedtime.   . carvedilol (COREG) 3.125 MG tablet Take 6.25 mg by mouth 2 (two) times daily with a meal.   . cetirizine (ZYRTEC ALLERGY) 10 MG tablet Take 10 mg by mouth at bedtime.    . Emollient (AQUAPHOR EX) Apply 1 application topically daily. Clean wound with peroxide and apply ointment to affected area of nose.   . fenofibrate 160 MG tablet TAKE 1 TABLET BY MOUTH ONCE DAILY (Patient taking differently: Take 80 mg by mouth daily. )   . fluticasone (FLONASE) 50 MCG/ACT nasal spray Place 1 spray into both nostrils daily.    . furosemide (LASIX) 20 MG tablet TAKE 1 TABLET BY MOUTH ONCE DAILY   . meloxicam (MOBIC) 7.5 MG tablet TAKE 1 TABLET BY MOUTH ONCE DAILY AS NEEDED (Patient taking differently: TAKE 1 TABLET BY MOUTH ONCE DAILY AS NEEDED FOR PAIN.)   . Multiple Vitamins-Minerals (PRESERVISION AREDS PO) Take 1 tablet by mouth 2 (two) times daily.  12/31/2017: ON HOLD PER PATIENT PREFERENCE  . Nutritional Supplements (JOINT FORMULA PO) Take 1 capsule by mouth daily before breakfast. Arthrozene   . Polyethyl Glycol-Propyl Glycol (SYSTANE) 0.4-0.3 % SOLN Place 1 drop into both eyes 3 (three) times daily as needed (for dry eyes).   . Probiotic Product (ALIGN) 4 MG CAPS Take 4 mg by mouth daily after lunch.    . ranitidine (ZANTAC) 150 MG tablet TAKE 1 TABLET BY MOUTH TWICE DAILY (Patient taking differently: TAKE 1 TABLET BY MOUTH TWICE DAILY (BEFORE BREAKFAST & BEFORE SUPPER))   . telmisartan (MICARDIS) 80 MG tablet Take 80 mg by mouth at bedtime.   . traMADol (ULTRAM) 50 MG tablet TAKE 1/2 TO 1 TABLET BY MOUTH EVERY 8 HOURS AS NEEDED (Patient taking differently: TAKE 0.5 TABLET (25 MG) BY MOUTH ONCE DAILY AT NIGHT.)   . VESICARE 5 MG tablet TAKE 1 TABLET BY MOUTH ONCE DAILY    No facility-administered encounter medications on file as of 05/23/2018.     Allergies    Allergen Reactions  . Accupril [Quinapril Hcl] Hives and Swelling    SWELLING OF NOSE/FACE  . Sulfa Antibiotics Shortness Of Breath and Swelling  . Clinoril [Sulindac] Other (See Comments)    Unsure of reaction type  . Hydrochlorothiazide Other (See Comments)    Unsure of reaction type  . Tessalon [Benzonatate] Other (See Comments)    Unsure of reaction type  . Amoxicillin Rash    Has patient had a PCN reaction causing immediate rash, facial/tongue/throat swelling, SOB or lightheadedness with hypotension: Unknown Has patient had a PCN reaction causing severe rash involving mucus membranes or skin necrosis: Unknown Has patient had a  PCN reaction that required hospitalization: No Has patient had a PCN reaction occurring within the last 10 years:Possibly unsure  If all of the above answers are "NO", then may proceed with Cephalosporin use.   . Codeine Sulfate Nausea And Vomiting  . Penicillins Rash    Has patient had a PCN reaction causing immediate rash, facial/tongue/throat swelling, SOB or lightheadedness with hypotension: Unknown Has patient had a PCN reaction causing severe rash involving mucus membranes or skin necrosis: Unknown Has patient had a PCN reaction that required hospitalization: No Has patient had a PCN reaction occurring within the last 10 years:Possibly unsure  If all of the above answers are "NO", then may proceed with Cephalosporin use.    Review of Systems  Constitutional: Negative.   HENT: Negative.        PND, rhinorrhea  Eyes: Positive for discharge.  Respiratory: Negative.   Cardiovascular: Negative.  Claudication: watering.  Gastrointestinal: Negative.   Genitourinary: Positive for frequency and urgency.  Musculoskeletal: Positive for myalgias.       Neck stiffness  Skin: Negative.   Neurological: Negative.   Endo/Heme/Allergies: Positive for environmental allergies. Bruises/bleeds easily.       Cold intolerance  Psychiatric/Behavioral: Negative.       Fall Risk  04/12/2018 02/23/2017 02/19/2016 08/14/2015  Falls in the past year? Yes No No No  Number falls in past yr: 1 - - -  Injury with Fall? No - - -  Follow up Falls prevention discussed - - -   Depression screen [facility] 2/9 04/12/2018 02/23/2017 02/19/2016 08/14/2015  Decreased Interest 0 0 0 0  Down, Depressed, Hopeless 0 0 0 0  PHQ - 2 Score 0 0 0 0   Functional Status Survey: Is the patient deaf or have difficulty hearing?: No Does the patient have difficulty seeing, even when wearing glasses/contacts?: Yes Does the patient have difficulty concentrating, remembering, or making decisions?: No Does the patient have difficulty walking or climbing stairs?: Yes Does the patient have difficulty dressing or bathing?: No Does the patient have difficulty doing errands alone such as visiting a doctor's office or shopping?: No    Office Visit from 05/23/2018 in [place]  AUDIT-C Score  0     6CIT Screen 04/12/2018 02/23/2017  What Year? 0 points 0 points  What month? 0 points 0 points  What time? 0 points 0 points  Count back from 20 0 points 0 points  Months in reverse 0 points 0 points  Repeat phrase 0 points 0 points  Total Score 0 0      Objective:  BP (!) 205/76 (BP Location: Right Arm, Patient Position: Sitting, Cuff Size: Normal)   Pulse 67   Temp 97.8 F (36.6 C) (Oral)   Resp 16   Ht 5\' 2"  (1.575 m)   Wt 139 lb (63 kg)   BMI 25.42 kg/m   Physical Exam  Constitutional: She is oriented to person, place, and time and well-developed, well-nourished, and in no distress.  HENT:  Head: Normocephalic and atraumatic.  Eyes: Conjunctivae are normal. No scleral icterus.  Neck: No thyromegaly present.  Cardiovascular: Normal rate, regular rhythm and normal heart sounds.  Pulmonary/Chest: Effort normal and breath sounds normal.  Abdominal: Soft.  Musculoskeletal:        General: No edema.  Lymphadenopathy:    She has no cervical adenopathy.  Neurological:  She is alert and oriented to person, place, and time. Gait normal. GCS score is 15.  Skin: Skin is  warm and dry.  Psychiatric: Mood, memory, affect and judgment normal.    Assessment and Plan :   1. Essential (primary) hypertension Beat blood pressure is 199/76.  Patient is convinced this is whitecoat hypertension.  May need to go up on her carvedilol in the near future.  Regular cuff and see her back in a month.  2. Gastro-esophageal reflux disease without esophagitis Controlled  3. Arthritis Stable  4. Hyperlipidemia, unspecified hyperlipidemia type Treated  5. OAB (overactive bladder) Finish and then stop the Vesicare due to cost.  Not use oxybutynin to avoid side effects in this 82 year old.  6. Hypertonicity of bladder   7. Detrusor muscle hypertonia     HPI, Exam and A&P Transcribed under the direction and in the presence of [name]., MD. Electronically Signed: Althea Charon, RMA I have done the exam and reviewed the chart and it is accurate to the best of my knowledge. Development worker, community has been used and  any errors in dictation or transcription are unintentional. Miguel Aschoff M.D. [facility]

## 2018-05-24 ENCOUNTER — Telehealth: Payer: Self-pay

## 2018-05-24 ENCOUNTER — Other Ambulatory Visit: Payer: Self-pay | Admitting: Family Medicine

## 2018-05-24 DIAGNOSIS — R202 Paresthesia of skin: Secondary | ICD-10-CM | POA: Diagnosis not present

## 2018-05-24 DIAGNOSIS — Z85828 Personal history of other malignant neoplasm of skin: Secondary | ICD-10-CM | POA: Diagnosis not present

## 2018-05-24 DIAGNOSIS — D2261 Melanocytic nevi of right upper limb, including shoulder: Secondary | ICD-10-CM | POA: Diagnosis not present

## 2018-05-24 DIAGNOSIS — Z1231 Encounter for screening mammogram for malignant neoplasm of breast: Secondary | ICD-10-CM

## 2018-05-24 DIAGNOSIS — L570 Actinic keratosis: Secondary | ICD-10-CM | POA: Diagnosis not present

## 2018-05-24 DIAGNOSIS — D2262 Melanocytic nevi of left upper limb, including shoulder: Secondary | ICD-10-CM | POA: Diagnosis not present

## 2018-05-24 DIAGNOSIS — X32XXXA Exposure to sunlight, initial encounter: Secondary | ICD-10-CM | POA: Diagnosis not present

## 2018-05-24 DIAGNOSIS — Z08 Encounter for follow-up examination after completed treatment for malignant neoplasm: Secondary | ICD-10-CM | POA: Diagnosis not present

## 2018-05-24 DIAGNOSIS — D225 Melanocytic nevi of trunk: Secondary | ICD-10-CM | POA: Diagnosis not present

## 2018-05-24 MED ORDER — NIZATIDINE 150 MG PO CAPS: 150.0000 mg | ORAL_CAPSULE | Freq: Two times a day (BID) | ORAL | 12 refills | Status: DC

## 2018-05-24 NOTE — Telephone Encounter (Signed)
Generic Tagamet 400mg  BID prn.

## 2018-05-24 NOTE — Telephone Encounter (Signed)
Dr. Rosanna Randy, looks like Tagamet is not covered either. The options they gave me were Nizatidine (150mg &300mg ) and Ranitidine (150mg &300mg ) tablets. Please advise. Thanks!

## 2018-05-24 NOTE — Telephone Encounter (Signed)
Patient had contacted the office stating that Dr. Rosanna Randy was suppose to send her a prescription for Pepcid and she states that [facility] has not received prescription. KW

## 2018-05-24 NOTE — Telephone Encounter (Signed)
Niztadine 150 BID

## 2018-05-24 NOTE — Telephone Encounter (Signed)
Done

## 2018-05-24 NOTE — Telephone Encounter (Signed)
Dr Darnell Level The Famotadine is not covered, can we give her soemthing else

## 2018-05-25 ENCOUNTER — Ambulatory Visit
Admission: RE | Admit: 2018-05-25 | Discharge: 2018-05-25 | Disposition: A | Discharge status: Home / Self Care | Payer: Medicare Other | Source: Ambulatory Visit | Attending: Family Medicine | Admitting: Family Medicine

## 2018-05-25 DIAGNOSIS — Z1231 Encounter for screening mammogram for malignant neoplasm of breast: Secondary | ICD-10-CM | POA: Diagnosis not present

## 2018-05-26 ENCOUNTER — Other Ambulatory Visit: Payer: Self-pay | Admitting: Family Medicine

## 2018-05-26 MED ORDER — VESICARE 5 MG PO TABS: 5.0000 mg | ORAL_TABLET | Freq: Every day | ORAL | 12 refills | Status: DC

## 2018-05-26 NOTE — Telephone Encounter (Signed)
Pt needing refill on: VESICARE 5 MG tablet  Please fill at: [facility], [place]. [phone] (Phone) [phone] (Fax)   Thanks, [employer]

## 2018-07-27 DIAGNOSIS — H02889 Meibomian gland dysfunction of unspecified eye, unspecified eyelid: Secondary | ICD-10-CM | POA: Diagnosis not present

## 2018-08-03 ENCOUNTER — Ambulatory Visit (INDEPENDENT_AMBULATORY_CARE_PROVIDER_SITE_OTHER): Payer: Medicare Other | Admitting: Family Medicine

## 2018-08-03 ENCOUNTER — Encounter: Payer: Self-pay | Admitting: Family Medicine

## 2018-08-03 VITALS — BP 144/72 | HR 80 | Temp 98.8°F | Resp 16 | Wt 140.0 lb

## 2018-08-03 DIAGNOSIS — I10 Essential (primary) hypertension: Secondary | ICD-10-CM | POA: Diagnosis not present

## 2018-08-03 DIAGNOSIS — H9209 Otalgia, unspecified ear: Secondary | ICD-10-CM

## 2018-08-03 DIAGNOSIS — N3281 Overactive bladder: Secondary | ICD-10-CM | POA: Diagnosis not present

## 2018-08-03 DIAGNOSIS — K219 Gastro-esophageal reflux disease without esophagitis: Secondary | ICD-10-CM | POA: Diagnosis not present

## 2018-08-03 MED ORDER — OMEPRAZOLE 20 MG PO CPDR: 20.0000 mg | DELAYED_RELEASE_CAPSULE | Freq: Every day | ORAL | 5 refills | Status: DC

## 2018-08-03 NOTE — Progress Notes (Signed)
Patient: Teresa Moyer Female    DOB: Jan 24, 1936   83 y.o.   MRN: [id number] Visit Date: 08/03/2018  Today's Provider: [name], MD   Chief Complaint  Patient presents with  . Hypertension  . Over Active Bladder  . Ear Pain  . Gastroesophageal Reflux   Subjective:     HPI  Hypertension, follow-up:  BP Readings from Last 3 Encounters:  08/03/18 (!) 144/72  05/23/18 (!) 205/76  04/12/18 (!) 160/68    She was last seen for hypertension 1 months ago.  BP at that visit was 205/76. Management since that visit includes no changes. She reports good compliance with treatment. She is not having side effects.  She is not exercising. She is adherent to low salt diet.   Outside blood pressures are checked daily. She is experiencing none.  Patient denies exertional chest pressure/discomfort, lower extremity edema and palpitations.   Cardiovascular risk factors include dyslipidemia.   Weight trend: stable Wt Readings from Last 3 Encounters:  08/03/18 140 lb (63.5 kg)  05/23/18 139 lb (63 kg)  04/12/18 139 lb 6.4 oz (63.2 kg)    Current diet: well balanced  OAB, follow up: Patient reports that she stopped taking Vesicare on 06/12/2018. She reports that symptoms are about the same.  Ear pain: Patient reports that she has had pain in her left ear that has been constant X 3 weeks. She does occasionally get headaches due to her ear pain.   Patient reports that she is also having bloating and excessive gas about 1 hour after eating.   Allergies  Allergen Reactions  . Accupril [Quinapril Hcl] Hives and Swelling    SWELLING OF NOSE/FACE  . Sulfa Antibiotics Shortness Of Breath and Swelling  . Clinoril [Sulindac] Other (See Comments)    Unsure of reaction type  . Hydrochlorothiazide Other (See Comments)    Unsure of reaction type  . Tessalon [Benzonatate] Other (See Comments)    Unsure of reaction type  . Amoxicillin Rash    Has patient had a PCN  reaction causing immediate rash, facial/tongue/throat swelling, SOB or lightheadedness with hypotension: Unknown Has patient had a PCN reaction causing severe rash involving mucus membranes or skin necrosis: Unknown Has patient had a PCN reaction that required hospitalization: No Has patient had a PCN reaction occurring within the last 10 years:Possibly unsure  If all of the above answers are "NO", then may proceed with Cephalosporin use.   . Codeine Sulfate Nausea And Vomiting  . Penicillins Rash    Has patient had a PCN reaction causing immediate rash, facial/tongue/throat swelling, SOB or lightheadedness with hypotension: Unknown Has patient had a PCN reaction causing severe rash involving mucus membranes or skin necrosis: Unknown Has patient had a PCN reaction that required hospitalization: No Has patient had a PCN reaction occurring within the last 10 years:Possibly unsure  If all of the above answers are "NO", then may proceed with Cephalosporin use.     Current Outpatient Medications:  .  acetaminophen (TYLENOL) 650 MG CR tablet, Take 650 mg by mouth every 8 (eight) hours as needed for pain., Disp: , Rfl:  .  amLODipine (NORVASC) 5 MG tablet, TAKE 1 TABLET BY MOUTH ONCE DAILY, Disp: 30 tablet, Rfl: 12 .  azelastine (ASTELIN) 0.1 % nasal spray, Place 1 spray into both nostrils at bedtime. , Disp: , Rfl:  .  Carboxymethylcell-Hypromellose (GENTEAL) 0.25-0.3 % GEL, Apply 1 application to eye at bedtime., Disp: , Rfl:  .  carvedilol (COREG) 3.125 MG tablet, Take 6.25 mg by mouth 2 (two) times daily with a meal., Disp: , Rfl:  .  cetirizine (ZYRTEC ALLERGY) 10 MG tablet, Take 10 mg by mouth at bedtime. , Disp: , Rfl:  .  Emollient (AQUAPHOR EX), Apply 1 application topically daily. Clean wound with peroxide and apply ointment to affected area of nose., Disp: , Rfl:  .  fenofibrate 160 MG tablet, TAKE 1 TABLET BY MOUTH ONCE DAILY (Patient taking differently: Take 80 mg by mouth daily. ),  Disp: 90 tablet, Rfl: 3 .  fluticasone (FLONASE) 50 MCG/ACT nasal spray, Place 1 spray into both nostrils daily. , Disp: , Rfl:  .  furosemide (LASIX) 20 MG tablet, TAKE 1 TABLET BY MOUTH ONCE DAILY, Disp: 30 tablet, Rfl: 11 .  meloxicam (MOBIC) 7.5 MG tablet, TAKE 1 TABLET BY MOUTH ONCE DAILY AS NEEDED (Patient taking differently: TAKE 1 TABLET BY MOUTH ONCE DAILY AS NEEDED FOR PAIN.), Disp: 60 tablet, Rfl: 5 .  Multiple Vitamins-Minerals (PRESERVISION AREDS PO), Take 1 tablet by mouth 2 (two) times daily. , Disp: , Rfl:  .  Nutritional Supplements (JOINT FORMULA PO), Take 1 capsule by mouth daily before breakfast. Arthrozene, Disp: , Rfl:  .  Polyethyl Glycol-Propyl Glycol (SYSTANE) 0.4-0.3 % SOLN, Place 1 drop into both eyes 3 (three) times daily as needed (for dry eyes)., Disp: , Rfl:  .  Probiotic Product (ALIGN) 4 MG CAPS, Take 4 mg by mouth daily after lunch. , Disp: , Rfl:  .  telmisartan (MICARDIS) 80 MG tablet, Take 80 mg by mouth at bedtime., Disp: , Rfl:  .  traMADol (ULTRAM) 50 MG tablet, TAKE 1/2 TO 1 TABLET BY MOUTH EVERY 8 HOURS AS NEEDED (Patient taking differently: TAKE 0.5 TABLET (25 MG) BY MOUTH ONCE DAILY AT NIGHT.), Disp: 60 tablet, Rfl: 3 .  nizatidine (AXID) 150 MG capsule, Take 1 capsule (150 mg total) by mouth 2 (two) times daily. (Patient not taking: Reported on 08/03/2018), Disp: 60 capsule, Rfl: 12 .  ranitidine (ZANTAC) 150 MG tablet, TAKE 1 TABLET BY MOUTH TWICE DAILY (Patient not taking: Reported on 05/23/2018), Disp: 60 tablet, Rfl: 12 .  VESICARE 5 MG tablet, Take 1 tablet (5 mg total) by mouth daily. (Patient not taking: Reported on 08/03/2018), Disp: 30 tablet, Rfl: 12  Review of Systems  Constitutional: Negative.   HENT: Negative.   Eyes: Negative.   Respiratory: Negative for cough and shortness of breath.   Cardiovascular: Negative for chest pain, palpitations and leg swelling.  Gastrointestinal: Positive for nausea. Negative for abdominal distention, abdominal  pain, anal bleeding, blood in stool and rectal pain.  Endocrine: Negative.   Allergic/Immunologic: Negative.   Neurological: Negative for dizziness and headaches.  Psychiatric/Behavioral: Negative.     Social History   Tobacco Use  . Smoking status: Never Smoker  . Smokeless tobacco: Never Used  Substance Use Topics  . Alcohol use: No      Objective:   BP (!) 144/72 (BP Location: Left Arm, Patient Position: Sitting, Cuff Size: Normal)   Pulse 80   Temp 98.8 F (37.1 C)   Resp 16   Wt 140 lb (63.5 kg)   BMI 25.61 kg/m  Vitals:   08/03/18 1328  BP: (!) 144/72  Pulse: 80  Resp: 16  Temp: 98.8 F (37.1 C)  Weight: 140 lb (63.5 kg)     Physical Exam Constitutional:      Appearance: She is well-developed.  HENT:  Head: Normocephalic and atraumatic.     Right Ear: External ear normal.     Left Ear: External ear normal.     Nose: Nose normal.  Eyes:     Conjunctiva/sclera: Conjunctivae normal.     Pupils: Pupils are equal, round, and reactive to light.  Neck:     Musculoskeletal: Normal range of motion and neck supple.  Cardiovascular:     Rate and Rhythm: Normal rate and regular rhythm.     Heart sounds: Murmur (2/6 systolic murmur) present.  Pulmonary:     Effort: Pulmonary effort is normal.     Breath sounds: Normal breath sounds.  Abdominal:     General: There is no distension.     Palpations: Abdomen is soft.     Tenderness: There is no abdominal tenderness.  Musculoskeletal: Normal range of motion.  Skin:    General: Skin is warm and dry.  Neurological:     Mental Status: She is alert and oriented to person, place, and time.     Deep Tendon Reflexes: Reflexes are normal and symmetric.  Psychiatric:        Behavior: Behavior normal.        Thought Content: Thought content normal.        Judgment: Judgment normal.         Assessment & Plan    1. Essential (primary) hypertension Double carvedilol dose.  2. Gastro-esophageal reflux disease  without esophagitis Axid and try daily PPI.  Return to clinic 3 to 4 months.  Consider cholestyramine for some GI symptoms. - omeprazole (PRILOSEC) 20 MG capsule; Take 1 capsule (20 mg total) by mouth daily.  Dispense: 30 capsule; Refill: 5  3. OAB (overactive bladder) Mild.  She feels no better when taking the Vesicare. 4.  Ear pain I think this is due to recent URI.  No treatment at this time.    Richard Cranford Mon, MD  [facility]

## 2018-08-12 ENCOUNTER — Other Ambulatory Visit: Payer: Self-pay | Admitting: Family Medicine

## 2018-08-15 NOTE — Telephone Encounter (Signed)
Pharmacy requesting refills. Thanks!  

## 2018-09-01 ENCOUNTER — Other Ambulatory Visit: Payer: Self-pay | Admitting: Family Medicine

## 2018-09-01 MED ORDER — CARVEDILOL 6.25 MG PO TABS: 6.2500 mg | ORAL_TABLET | Freq: Every day | ORAL | 1 refills | Status: DC

## 2018-09-01 NOTE — Telephone Encounter (Signed)
°  Pt needing a refill on:  carvedilol (COREG) 3.125 MG tablet  Needing the mg dosage doubled  for the 2 pills she is taking so she will only have to take 1.  Please fill at:  [facility], [place]. [phone] (Phone) [phone] (Fax)   Thanks, [employer]

## 2018-09-06 ENCOUNTER — Other Ambulatory Visit: Payer: Self-pay

## 2018-09-06 ENCOUNTER — Telehealth: Payer: Self-pay

## 2018-09-06 MED ORDER — CARVEDILOL 12.5 MG PO TABS: 12.5000 mg | ORAL_TABLET | Freq: Every day | ORAL | 12 refills | Status: DC

## 2018-09-06 MED ORDER — CARVEDILOL 12.5 MG PO TABS: 12.5000 mg | ORAL_TABLET | Freq: Two times a day (BID) | ORAL | 12 refills | Status: DC

## 2018-09-06 NOTE — Telephone Encounter (Signed)
Patient called and stated that the Coreg 12.5 mg was the wrong dose and she would like to take it twice a day, not once. Patient states she has been taking 6.25 Coreg tablet 4 times a day and it has really helped her BP out. I advised patient that she was only suppose to be taking 2 of the 6.25 mg daily and that is why Dr. Rosanna Randy had send in the to take Coreg 12.5 mg daily. Dr.Gilbert did you want to up her dose to 12.5 mg twice a day or leave it as is? Please advise.

## 2018-09-06 NOTE — Telephone Encounter (Signed)
Dr.Gilbert advised [facility] to change the Coreg 12.5 mg twice a day.

## 2018-10-03 ENCOUNTER — Other Ambulatory Visit: Payer: Self-pay | Admitting: Family Medicine

## 2018-10-03 NOTE — Telephone Encounter (Signed)
Pharmacy requesting refills. Thanks!  

## 2018-10-19 ENCOUNTER — Other Ambulatory Visit: Payer: Self-pay | Admitting: Family Medicine

## 2018-10-19 DIAGNOSIS — K219 Gastro-esophageal reflux disease without esophagitis: Secondary | ICD-10-CM

## 2018-10-19 MED ORDER — OMEPRAZOLE 20 MG PO CPDR: 20.0000 mg | DELAYED_RELEASE_CAPSULE | Freq: Every day | ORAL | 3 refills | Status: DC

## 2018-10-19 NOTE — Telephone Encounter (Signed)
Pt's insurance sent message stating that pt's medication co-pay for a 90 day supply would be equal or less than the co-pay for 30 day supply. They are requesting a 90 day supply of omeprazole (PRILOSEC) 20 MG capsule be sent to [facility]. Please advise. Thanks TNP

## 2018-11-01 ENCOUNTER — Ambulatory Visit: Payer: Medicare Other | Admitting: Family Medicine

## 2018-11-16 DIAGNOSIS — E782 Mixed hyperlipidemia: Secondary | ICD-10-CM | POA: Diagnosis not present

## 2018-11-16 DIAGNOSIS — I350 Nonrheumatic aortic (valve) stenosis: Secondary | ICD-10-CM | POA: Diagnosis not present

## 2018-11-16 DIAGNOSIS — I10 Essential (primary) hypertension: Secondary | ICD-10-CM | POA: Diagnosis not present

## 2018-11-16 DIAGNOSIS — I340 Nonrheumatic mitral (valve) insufficiency: Secondary | ICD-10-CM | POA: Diagnosis not present

## 2018-11-17 ENCOUNTER — Other Ambulatory Visit: Payer: Self-pay

## 2018-11-17 ENCOUNTER — Ambulatory Visit (INDEPENDENT_AMBULATORY_CARE_PROVIDER_SITE_OTHER): Payer: Medicare Other | Admitting: Family Medicine

## 2018-11-17 ENCOUNTER — Encounter: Payer: Self-pay | Admitting: Family Medicine

## 2018-11-17 VITALS — BP 177/69 | HR 68 | Temp 98.6°F | Resp 16 | Wt 144.6 lb

## 2018-11-17 DIAGNOSIS — K219 Gastro-esophageal reflux disease without esophagitis: Secondary | ICD-10-CM | POA: Diagnosis not present

## 2018-11-17 DIAGNOSIS — R195 Other fecal abnormalities: Secondary | ICD-10-CM

## 2018-11-17 DIAGNOSIS — E785 Hyperlipidemia, unspecified: Secondary | ICD-10-CM | POA: Diagnosis not present

## 2018-11-17 DIAGNOSIS — I10 Essential (primary) hypertension: Secondary | ICD-10-CM | POA: Diagnosis not present

## 2018-11-17 NOTE — Patient Instructions (Signed)
Add Metamucil daily.

## 2018-11-17 NOTE — Progress Notes (Signed)
Patient: Teresa Moyer Female    DOB: [birth year]   83 y.o.   MRN: [id number] Visit Date: 11/17/2018  Today's Provider: [name], MD   Chief Complaint  Patient presents with  . Hypertension   Subjective:     HPI  Hypertension, follow-up: BPs at home 115-130s over 60s. BP Readings from Last 3 Encounters:  11/17/18 (!) 177/69  08/03/18 (!) 144/72  05/23/18 (!) 205/76    She was last seen for hypertension 3 months ago.  BP at that visit was 144/72. Management changes since that visit include increasing Carvedilol. She reports excellent compliance with treatment. She is having side effects. Patient reports softer stools.  She is exercising. She is not adherent to low salt diet.   Outside blood pressures are systolic ranging from 782-956 and diastolic ranging from 21-30. She is experiencing none.  Patient denies chest pain, chest pressure/discomfort, dyspnea, exertional chest pressure/discomfort, fatigue, irregular heart beat, near-syncope, palpitations and syncope.   Cardiovascular risk factors include advanced age (older than 10 for men, 77 for women) and hypertension.  Use of agents associated with hypertension: none.     Weight trend: increasing steadily Wt Readings from Last 3 Encounters:  11/17/18 144 lb 9.6 oz (65.6 kg)  08/03/18 140 lb (63.5 kg)  05/23/18 139 lb (63 kg)    Current diet: on average, 3 meals per day  ------------------------------------------------------------------------  Allergies  Allergen Reactions  . Accupril [Quinapril Hcl] Hives and Swelling    SWELLING OF NOSE/FACE  . Sulfa Antibiotics Shortness Of Breath and Swelling  . Clinoril [Sulindac] Other (See Comments)    Unsure of reaction type  . Hydrochlorothiazide Other (See Comments)    Unsure of reaction type  . Tessalon [Benzonatate] Other (See Comments)    Unsure of reaction type  . Amoxicillin Rash    Has patient had a PCN reaction causing immediate rash,  facial/tongue/throat swelling, SOB or lightheadedness with hypotension: Unknown Has patient had a PCN reaction causing severe rash involving mucus membranes or skin necrosis: Unknown Has patient had a PCN reaction that required hospitalization: No Has patient had a PCN reaction occurring within the last 10 years:Possibly unsure  If all of the above answers are "NO", then may proceed with Cephalosporin use.   . Codeine Sulfate Nausea And Vomiting  . Penicillins Rash    Has patient had a PCN reaction causing immediate rash, facial/tongue/throat swelling, SOB or lightheadedness with hypotension: Unknown Has patient had a PCN reaction causing severe rash involving mucus membranes or skin necrosis: Unknown Has patient had a PCN reaction that required hospitalization: No Has patient had a PCN reaction occurring within the last 10 years:Possibly unsure  If all of the above answers are "NO", then may proceed with Cephalosporin use.     Current Outpatient Medications:  .  acetaminophen (TYLENOL) 650 MG CR tablet, Take 650 mg by mouth every 8 (eight) hours as needed for pain., Disp: , Rfl:  .  amLODipine (NORVASC) 5 MG tablet, TAKE 1 TABLET BY MOUTH ONCE DAILY, Disp: 30 tablet, Rfl: 12 .  azelastine (ASTELIN) 0.1 % nasal spray, Place 1 spray into both nostrils at bedtime. , Disp: , Rfl:  .  Carboxymethylcell-Hypromellose (GENTEAL) 0.25-0.3 % GEL, Apply 1 application to eye at bedtime., Disp: , Rfl:  .  carvedilol (COREG) 12.5 MG tablet, Take 1 tablet (12.5 mg total) by mouth 2 (two) times daily with a meal., Disp: 60 tablet, Rfl: 12 .  cetirizine (ZYRTEC  ALLERGY) 10 MG tablet, Take 10 mg by mouth at bedtime. , Disp: , Rfl:  .  Emollient (AQUAPHOR EX), Apply 1 application topically daily. Clean wound with peroxide and apply ointment to affected area of nose., Disp: , Rfl:  .  fenofibrate 160 MG tablet, TAKE 1 TABLET BY MOUTH ONCE DAILY (Patient taking differently: Take 80 mg by mouth daily. ), Disp: 90  tablet, Rfl: 3 .  fluticasone (FLONASE) 50 MCG/ACT nasal spray, Place 1 spray into both nostrils daily. , Disp: , Rfl:  .  furosemide (LASIX) 20 MG tablet, TAKE 1 TABLET BY MOUTH ONCE DAILY., Disp: 30 tablet, Rfl: 11 .  meloxicam (MOBIC) 7.5 MG tablet, TAKE 1 TABLET BY MOUTH ONCE DAILY AS NEEDED (Patient taking differently: TAKE 1 TABLET BY MOUTH ONCE DAILY AS NEEDED FOR PAIN.), Disp: 60 tablet, Rfl: 5 .  Multiple Vitamins-Minerals (PRESERVISION AREDS PO), Take 1 tablet by mouth 2 (two) times daily. , Disp: , Rfl:  .  Nutritional Supplements (JOINT FORMULA PO), Take 1 capsule by mouth daily before breakfast. Arthrozene, Disp: , Rfl:  .  omeprazole (PRILOSEC) 20 MG capsule, Take 1 capsule (20 mg total) by mouth daily., Disp: 90 capsule, Rfl: 3 .  Polyethyl Glycol-Propyl Glycol (SYSTANE) 0.4-0.3 % SOLN, Place 1 drop into both eyes 3 (three) times daily as needed (for dry eyes)., Disp: , Rfl:  .  Probiotic Product (ALIGN) 4 MG CAPS, Take 4 mg by mouth daily after lunch. , Disp: , Rfl:  .  ranitidine (ZANTAC) 150 MG tablet, TAKE 1 TABLET BY MOUTH TWICE DAILY, Disp: 60 tablet, Rfl: 12 .  telmisartan (MICARDIS) 80 MG tablet, Take 80 mg by mouth at bedtime., Disp: , Rfl:  .  traMADol (ULTRAM) 50 MG tablet, TAKE 1/2 TO 1 TABLET BY MOUTH EVERY 8 HOURS AS NEEDED, Disp: 60 tablet, Rfl: 5 .  VESICARE 5 MG tablet, Take 1 tablet (5 mg total) by mouth daily., Disp: 30 tablet, Rfl: 12  Review of Systems  Constitutional: Negative.   HENT: Negative.   Eyes: Negative.   Respiratory: Negative.   Gastrointestinal: Negative.   Endocrine: Negative.   Musculoskeletal: Positive for arthralgias.  Allergic/Immunologic: Negative.   Neurological: Negative.   Hematological: Negative.   Psychiatric/Behavioral: Negative.     Social History   Tobacco Use  . Smoking status: Never Smoker  . Smokeless tobacco: Never Used  Substance Use Topics  . Alcohol use: No      Objective:   BP (!) 177/69   Pulse 68   Temp  98.6 F (37 C) (Oral)   Resp 16   Wt 144 lb 9.6 oz (65.6 kg)   BMI 26.45 kg/m  Vitals:   11/17/18 1545  BP: (!) 177/69  Pulse: 68  Resp: 16  Temp: 98.6 F (37 C)  TempSrc: Oral  Weight: 144 lb 9.6 oz (65.6 kg)     Physical Exam Vitals signs reviewed.  Constitutional:      Appearance: She is well-developed.  HENT:     Head: Normocephalic and atraumatic.     Right Ear: External ear normal.     Left Ear: External ear normal.     Nose: Nose normal.  Eyes:     Conjunctiva/sclera: Conjunctivae normal.     Pupils: Pupils are equal, round, and reactive to light.  Neck:     Musculoskeletal: Normal range of motion and neck supple.  Cardiovascular:     Rate and Rhythm: Normal rate and regular rhythm.  Heart sounds: Murmur (2/6 systolic murmur) present.  Pulmonary:     Effort: Pulmonary effort is normal.     Breath sounds: Normal breath sounds.  Abdominal:     General: There is no distension.     Palpations: Abdomen is soft.     Tenderness: There is no abdominal tenderness.  Musculoskeletal: Normal range of motion.  Skin:    General: Skin is warm and dry.  Neurological:     Mental Status: She is alert and oriented to person, place, and time.     Deep Tendon Reflexes: Reflexes are normal and symmetric.  Psychiatric:        Behavior: Behavior normal.        Thought Content: Thought content normal.        Judgment: Judgment normal.         Assessment & Plan    1. Hyperlipidemia, unspecified hyperlipidemia type Consider stopping fenofibrate. - Lipid panel  2. Essential (primary) hypertension Controlled with amlodipine. - Basic metabolic panel - TSH  3. Gastro-esophageal reflux disease without esophagitis  - CBC with Differential/Platelet  4. Loose stools Mild--try metamucil daily.     Juanangel Soderholm Cranford Mon, MD  [facility] Fritzi Mandes Wolford,acting as a scribe for [name], MD.,have documented all  relevant documentation on the behalf of [name], MD,as directed by  [name], MD while in the presence of [name], MD.

## 2018-11-18 DIAGNOSIS — K219 Gastro-esophageal reflux disease without esophagitis: Secondary | ICD-10-CM | POA: Diagnosis not present

## 2018-11-18 DIAGNOSIS — I10 Essential (primary) hypertension: Secondary | ICD-10-CM | POA: Diagnosis not present

## 2018-11-18 DIAGNOSIS — E785 Hyperlipidemia, unspecified: Secondary | ICD-10-CM | POA: Diagnosis not present

## 2018-11-19 LAB — LIPID PANEL
Chol/HDL Ratio: 2.1 ratio (ref 0.0–4.4)
Cholesterol, Total: 163 mg/dL (ref 100–199)
HDL: 77 mg/dL (ref >39)
LDL Calculated: 61 mg/dL (ref 0–99)
Triglycerides: 127 mg/dL (ref 0–149)
VLDL Cholesterol Cal: 25 mg/dL (ref 5–40)

## 2018-11-19 LAB — BASIC METABOLIC PANEL
BUN/Creatinine Ratio: 21 (ref 12–28)
BUN: 19 mg/dL (ref 8–27)
CO2: 23 mmol/L (ref 20–29)
Calcium: 9.8 mg/dL (ref 8.7–10.3)
Chloride: 101 mmol/L (ref 96–106)
Creatinine, Ser: 0.89 mg/dL (ref 0.57–1.00)
GFR calc Af Amer: 70 mL/min/{1.73_m2} (ref >59)
GFR calc non Af Amer: 61 mL/min/{1.73_m2} (ref >59)
Glucose: 85 mg/dL (ref 65–99)
Potassium: 4.4 mmol/L (ref 3.5–5.2)
Sodium: 139 mmol/L (ref 134–144)

## 2018-11-19 LAB — CBC WITH DIFFERENTIAL/PLATELET
Basophils Absolute: 0.1 10*3/uL (ref 0.0–0.2)
Basos: 1 %
EOS (ABSOLUTE): 0.1 10*3/uL (ref 0.0–0.4)
Eos: 2 %
Hematocrit: 36.4 % (ref 34.0–46.6)
Hemoglobin: 12.6 g/dL (ref 11.1–15.9)
Immature Grans (Abs): 0 10*3/uL (ref 0.0–0.1)
Immature Granulocytes: 0 %
Lymphocytes Absolute: 2.1 10*3/uL (ref 0.7–3.1)
Lymphs: 26 %
MCH: 31.1 pg (ref 26.6–33.0)
MCHC: 34.6 g/dL (ref 31.5–35.7)
MCV: 90 fL (ref 79–97)
Monocytes Absolute: 0.7 10*3/uL (ref 0.1–0.9)
Monocytes: 9 %
Neutrophils Absolute: 4.9 10*3/uL (ref 1.4–7.0)
Neutrophils: 62 %
Platelets: 254 10*3/uL (ref 150–450)
RBC: 4.05 x10E6/uL (ref 3.77–5.28)
RDW: 12 % (ref 11.7–15.4)
WBC: 7.8 10*3/uL (ref 3.4–10.8)

## 2018-11-19 LAB — TSH: TSH: 1.77 u[IU]/mL (ref 0.450–4.500)

## 2018-11-22 ENCOUNTER — Other Ambulatory Visit: Payer: Self-pay | Admitting: Family Medicine

## 2018-11-22 NOTE — Telephone Encounter (Signed)
Please review

## 2018-11-22 NOTE — Telephone Encounter (Signed)
[facility] faxed refill request for the following medications:  carvedilol (COREG) 12.5 MG tablet Quantity: 60   Please advise.  Thanks, [employer]

## 2018-11-23 ENCOUNTER — Telehealth: Payer: Self-pay

## 2018-11-23 DIAGNOSIS — L738 Other specified follicular disorders: Secondary | ICD-10-CM | POA: Diagnosis not present

## 2018-11-23 DIAGNOSIS — D485 Neoplasm of uncertain behavior of skin: Secondary | ICD-10-CM | POA: Diagnosis not present

## 2018-11-23 DIAGNOSIS — Z08 Encounter for follow-up examination after completed treatment for malignant neoplasm: Secondary | ICD-10-CM | POA: Diagnosis not present

## 2018-11-23 DIAGNOSIS — Z85828 Personal history of other malignant neoplasm of skin: Secondary | ICD-10-CM | POA: Diagnosis not present

## 2018-11-23 DIAGNOSIS — L820 Inflamed seborrheic keratosis: Secondary | ICD-10-CM | POA: Diagnosis not present

## 2018-11-23 DIAGNOSIS — L089 Local infection of the skin and subcutaneous tissue, unspecified: Secondary | ICD-10-CM | POA: Diagnosis not present

## 2018-11-23 DIAGNOSIS — C4441 Basal cell carcinoma of skin of scalp and neck: Secondary | ICD-10-CM | POA: Diagnosis not present

## 2018-11-23 NOTE — Telephone Encounter (Signed)
-----   Message from Jerrol Banana., MD sent at 11/23/2018 12:10 PM EDT ----- Labs good.

## 2018-11-23 NOTE — Telephone Encounter (Signed)
Patient advised.KW 

## 2018-11-28 NOTE — Telephone Encounter (Signed)
Ok to rf for 1 year--thx

## 2018-11-29 MED ORDER — CARVEDILOL 12.5 MG PO TABS: 12.5000 mg | ORAL_TABLET | Freq: Two times a day (BID) | ORAL | 12 refills | Status: DC

## 2018-11-29 NOTE — Telephone Encounter (Signed)
Medication sent into the pharmacy. 

## 2018-12-07 DIAGNOSIS — H353211 Exudative age-related macular degeneration, right eye, with active choroidal neovascularization: Secondary | ICD-10-CM | POA: Diagnosis not present

## 2018-12-08 DIAGNOSIS — H353122 Nonexudative age-related macular degeneration, left eye, intermediate dry stage: Secondary | ICD-10-CM | POA: Diagnosis not present

## 2018-12-08 DIAGNOSIS — H353211 Exudative age-related macular degeneration, right eye, with active choroidal neovascularization: Secondary | ICD-10-CM | POA: Diagnosis not present

## 2018-12-15 ENCOUNTER — Telehealth: Payer: Self-pay | Admitting: Family Medicine

## 2018-12-15 NOTE — Chronic Care Management (AMB) (Signed)
Chronic Care Management   Note  12/15/2018 Name: Teresa Moyer MRN: [id number] DOB: 11-Oct-1935  Teresa Moyer is a 83 y.o. year old female who is a primary care patient of Jerrol Banana., MD. I reached out to [name] by phone today in response to a referral sent by [name]'s health plan.    [name] was given information about Chronic Care Management services today including:  1. CCM service includes personalized support from designated clinical staff supervised by her physician, including individualized plan of care and coordination with other care providers 2. 24/7 contact phone numbers for assistance for urgent and routine care needs. 3. Service will only be billed when office clinical staff spend 20 minutes or more in a month to coordinate care. 4. Only one practitioner may furnish and bill the service in a calendar month. 5. The patient may stop CCM services at any time (effective at the end of the month) by phone call to the office staff. 6. The patient will be responsible for cost sharing (co-pay) of up to 20% of the service fee (after annual deductible is met).  Patient agreed to services and verbal consent obtained.   Follow up plan: Telephone appointment with CCM team member scheduled for: 01/12/2019  [place]  ??[email or web address]   ??[phone]

## 2018-12-22 DIAGNOSIS — C4441 Basal cell carcinoma of skin of scalp and neck: Secondary | ICD-10-CM | POA: Diagnosis not present

## 2018-12-26 ENCOUNTER — Other Ambulatory Visit: Payer: Self-pay | Admitting: Family Medicine

## 2018-12-27 ENCOUNTER — Encounter
Admission: EM | Disposition: A | Discharge status: Home / Self Care | Payer: Self-pay | Source: Home / Self Care | Attending: Internal Medicine

## 2018-12-27 ENCOUNTER — Emergency Department: Admit: 2018-12-27 | Payer: Medicare Other | Admitting: Internal Medicine

## 2018-12-27 ENCOUNTER — Other Ambulatory Visit: Payer: Self-pay

## 2018-12-27 ENCOUNTER — Inpatient Hospital Stay
Admit: 2018-12-27 | Discharge: 2018-12-27 | Disposition: A | Discharge status: Home / Self Care | Payer: Medicare Other | Attending: Internal Medicine | Admitting: Internal Medicine

## 2018-12-27 ENCOUNTER — Inpatient Hospital Stay
Admission: EM | Admit: 2018-12-27 | Discharge: 2018-12-29 | DRG: 246 | Disposition: A | Discharge status: Home / Self Care | Payer: Medicare Other | Attending: Internal Medicine | Admitting: Internal Medicine

## 2018-12-27 ENCOUNTER — Encounter: Payer: Self-pay | Admitting: Emergency Medicine

## 2018-12-27 ENCOUNTER — Inpatient Hospital Stay: Payer: Medicare Other

## 2018-12-27 DIAGNOSIS — Z8249 Family history of ischemic heart disease and other diseases of the circulatory system: Secondary | ICD-10-CM

## 2018-12-27 DIAGNOSIS — I2102 ST elevation (STEMI) myocardial infarction involving left anterior descending coronary artery: Principal | ICD-10-CM | POA: Diagnosis present

## 2018-12-27 DIAGNOSIS — R0602 Shortness of breath: Secondary | ICD-10-CM

## 2018-12-27 DIAGNOSIS — I2109 ST elevation (STEMI) myocardial infarction involving other coronary artery of anterior wall: Secondary | ICD-10-CM | POA: Diagnosis not present

## 2018-12-27 DIAGNOSIS — I499 Cardiac arrhythmia, unspecified: Secondary | ICD-10-CM | POA: Diagnosis not present

## 2018-12-27 DIAGNOSIS — K219 Gastro-esophageal reflux disease without esophagitis: Secondary | ICD-10-CM | POA: Diagnosis present

## 2018-12-27 DIAGNOSIS — E785 Hyperlipidemia, unspecified: Secondary | ICD-10-CM | POA: Diagnosis present

## 2018-12-27 DIAGNOSIS — I110 Hypertensive heart disease with heart failure: Secondary | ICD-10-CM | POA: Diagnosis present

## 2018-12-27 DIAGNOSIS — I10 Essential (primary) hypertension: Secondary | ICD-10-CM

## 2018-12-27 DIAGNOSIS — I2510 Atherosclerotic heart disease of native coronary artery without angina pectoris: Secondary | ICD-10-CM | POA: Diagnosis present

## 2018-12-27 DIAGNOSIS — Z20828 Contact with and (suspected) exposure to other viral communicable diseases: Secondary | ICD-10-CM | POA: Diagnosis present

## 2018-12-27 DIAGNOSIS — L7632 Postprocedural hematoma of skin and subcutaneous tissue following other procedure: Secondary | ICD-10-CM | POA: Diagnosis not present

## 2018-12-27 DIAGNOSIS — I5021 Acute systolic (congestive) heart failure: Secondary | ICD-10-CM

## 2018-12-27 DIAGNOSIS — R32 Unspecified urinary incontinence: Secondary | ICD-10-CM | POA: Diagnosis present

## 2018-12-27 DIAGNOSIS — I213 ST elevation (STEMI) myocardial infarction of unspecified site: Secondary | ICD-10-CM | POA: Diagnosis not present

## 2018-12-27 DIAGNOSIS — Z825 Family history of asthma and other chronic lower respiratory diseases: Secondary | ICD-10-CM

## 2018-12-27 DIAGNOSIS — E876 Hypokalemia: Secondary | ICD-10-CM | POA: Diagnosis present

## 2018-12-27 DIAGNOSIS — I255 Ischemic cardiomyopathy: Secondary | ICD-10-CM | POA: Diagnosis present

## 2018-12-27 DIAGNOSIS — R079 Chest pain, unspecified: Secondary | ICD-10-CM | POA: Diagnosis not present

## 2018-12-27 DIAGNOSIS — Y840 Cardiac catheterization as the cause of abnormal reaction of the patient, or of later complication, without mention of misadventure at the time of the procedure: Secondary | ICD-10-CM | POA: Diagnosis present

## 2018-12-27 DIAGNOSIS — I350 Nonrheumatic aortic (valve) stenosis: Secondary | ICD-10-CM | POA: Diagnosis present

## 2018-12-27 DIAGNOSIS — Z9861 Coronary angioplasty status: Secondary | ICD-10-CM | POA: Diagnosis not present

## 2018-12-27 DIAGNOSIS — J9601 Acute respiratory failure with hypoxia: Secondary | ICD-10-CM | POA: Diagnosis present

## 2018-12-27 DIAGNOSIS — R0902 Hypoxemia: Secondary | ICD-10-CM | POA: Diagnosis not present

## 2018-12-27 HISTORY — PX: LEFT HEART CATH AND CORONARY ANGIOGRAPHY: CATH118249

## 2018-12-27 HISTORY — PX: CORONARY/GRAFT ACUTE MI REVASCULARIZATION: CATH118305

## 2018-12-27 LAB — CBC WITH DIFFERENTIAL/PLATELET
Abs Immature Granulocytes: 0.02 10*3/uL (ref 0.00–0.07)
Basophils Absolute: 0 10*3/uL (ref 0.0–0.1)
Basophils Relative: 1 %
Eosinophils Absolute: 0.1 10*3/uL (ref 0.0–0.5)
Eosinophils Relative: 2 %
HCT: 37.2 % (ref 36.0–46.0)
Hemoglobin: 12.4 g/dL (ref 12.0–15.0)
Immature Granulocytes: 0 %
Lymphocytes Relative: 33 %
Lymphs Abs: 2.2 10*3/uL (ref 0.7–4.0)
MCH: 30.2 pg (ref 26.0–34.0)
MCHC: 33.3 g/dL (ref 30.0–36.0)
MCV: 90.7 fL (ref 80.0–100.0)
Monocytes Absolute: 0.8 10*3/uL (ref 0.1–1.0)
Monocytes Relative: 13 %
Neutro Abs: 3.5 10*3/uL (ref 1.7–7.7)
Neutrophils Relative %: 51 %
Platelets: 288 10*3/uL (ref 150–400)
RBC: 4.1 MIL/uL (ref 3.87–5.11)
RDW: 12.9 % (ref 11.5–15.5)
WBC: 6.7 10*3/uL (ref 4.0–10.5)
nRBC: 0 % (ref 0.0–0.2)

## 2018-12-27 LAB — BASIC METABOLIC PANEL
Anion gap: 11 (ref 5–15)
BUN: 19 mg/dL (ref 8–23)
CO2: 21 mmol/L — ABNORMAL LOW (ref 22–32)
Calcium: 8.6 mg/dL — ABNORMAL LOW (ref 8.9–10.3)
Chloride: 105 mmol/L (ref 98–111)
Creatinine, Ser: 0.75 mg/dL (ref 0.44–1.00)
GFR calc Af Amer: >60 mL/min (ref >60)
GFR calc non Af Amer: >60 mL/min (ref >60)
Glucose, Bld: 195 mg/dL — ABNORMAL HIGH (ref 70–99)
Potassium: 3.3 mmol/L — ABNORMAL LOW (ref 3.5–5.1)
Sodium: 137 mmol/L (ref 135–145)

## 2018-12-27 LAB — SARS CORONAVIRUS 2 BY RT PCR (HOSPITAL ORDER, PERFORMED IN ~~LOC~~ HOSPITAL LAB): SARS Coronavirus 2: NEGATIVE

## 2018-12-27 LAB — TROPONIN I (HIGH SENSITIVITY)
Troponin I (High Sensitivity): 22 ng/L — ABNORMAL HIGH (ref <18)
Troponin I (High Sensitivity): >27000 ng/L (ref <18)
Troponin I (High Sensitivity): >27000 ng/L (ref <18)

## 2018-12-27 LAB — POCT ACTIVATED CLOTTING TIME: Activated Clotting Time: 257 seconds

## 2018-12-27 SURGERY — CORONARY/GRAFT ACUTE MI REVASCULARIZATION
Anesthesia: Moderate Sedation

## 2018-12-27 MED ORDER — VERAPAMIL HCL 2.5 MG/ML IV SOLN
INTRAVENOUS | Status: AC
  Filled 2018-12-27: qty 2

## 2018-12-27 MED ORDER — TICAGRELOR 90 MG PO TABS
ORAL_TABLET | ORAL | Status: DC | PRN
  Administered 2018-12-27: 180 mg via ORAL

## 2018-12-27 MED ORDER — NITROGLYCERIN 1 MG/10 ML FOR IR/CATH LAB
INTRA_ARTERIAL | Status: DC | PRN
  Administered 2018-12-27 (×2): 200 ug via INTRACORONARY

## 2018-12-27 MED ORDER — FUROSEMIDE 10 MG/ML IJ SOLN
INTRAMUSCULAR | Status: DC | PRN
  Administered 2018-12-27: 20 mg via INTRAVENOUS

## 2018-12-27 MED ORDER — MIDAZOLAM HCL 2 MG/2ML IJ SOLN
INTRAMUSCULAR | Status: DC | PRN
  Administered 2018-12-27: 0.5 mg via INTRAVENOUS

## 2018-12-27 MED ORDER — SODIUM CHLORIDE 0.9 % IV SOLN: 250.0000 mL | INTRAVENOUS | Status: DC | PRN

## 2018-12-27 MED ORDER — FENOFIBRATE 160 MG PO TABS
80.0000 mg | ORAL_TABLET | Freq: Every day | ORAL | Status: DC
  Administered 2018-12-28 – 2018-12-29 (×2): 80 mg via ORAL
  Filled 2018-12-27 (×2): qty 0.5

## 2018-12-27 MED ORDER — IRBESARTAN 150 MG PO TABS
150.0000 mg | ORAL_TABLET | Freq: Once | ORAL | Status: AC
  Administered 2018-12-27: 150 mg via ORAL
  Filled 2018-12-27: qty 1

## 2018-12-27 MED ORDER — DARIFENACIN HYDROBROMIDE ER 7.5 MG PO TB24
7.5000 mg | ORAL_TABLET | Freq: Every day | ORAL | Status: DC
  Administered 2018-12-28 – 2018-12-29 (×2): 7.5 mg via ORAL
  Filled 2018-12-27 (×2): qty 1

## 2018-12-27 MED ORDER — TIROFIBAN HCL IN NACL 5-0.9 MG/100ML-% IV SOLN
INTRAVENOUS | Status: AC | PRN
  Administered 2018-12-27: 0.15 ug/kg/min via INTRAVENOUS

## 2018-12-27 MED ORDER — PANTOPRAZOLE SODIUM 40 MG PO TBEC
40.0000 mg | DELAYED_RELEASE_TABLET | Freq: Every day | ORAL | Status: DC
  Administered 2018-12-28 – 2018-12-29 (×2): 40 mg via ORAL
  Filled 2018-12-27 (×2): qty 1

## 2018-12-27 MED ORDER — ENOXAPARIN SODIUM 40 MG/0.4ML ~~LOC~~ SOLN
40.0000 mg | SUBCUTANEOUS | Status: DC
  Administered 2018-12-28 – 2018-12-29 (×2): 40 mg via SUBCUTANEOUS
  Filled 2018-12-27 (×2): qty 0.4

## 2018-12-27 MED ORDER — TICAGRELOR 90 MG PO TABS: 90.0000 mg | ORAL_TABLET | Freq: Two times a day (BID) | ORAL | Status: DC

## 2018-12-27 MED ORDER — SODIUM CHLORIDE 0.9% FLUSH: 3.0000 mL | INTRAVENOUS | Status: DC | PRN

## 2018-12-27 MED ORDER — CARVEDILOL 6.25 MG PO TABS
6.2500 mg | ORAL_TABLET | Freq: Two times a day (BID) | ORAL | Status: DC
  Administered 2018-12-27 – 2018-12-29 (×4): 6.25 mg via ORAL
  Filled 2018-12-27 (×4): qty 1

## 2018-12-27 MED ORDER — POLYVINYL ALCOHOL 1.4 % OP SOLN
1.0000 [drp] | Freq: Every day | OPHTHALMIC | Status: DC
  Administered 2018-12-27 – 2018-12-28 (×2): 1 [drp] via OPHTHALMIC
  Filled 2018-12-27 (×2): qty 15

## 2018-12-27 MED ORDER — TRAMADOL HCL 50 MG PO TABS: 25.0000 mg | ORAL_TABLET | Freq: Four times a day (QID) | ORAL | Status: DC | PRN

## 2018-12-27 MED ORDER — HEPARIN SODIUM (PORCINE) 1000 UNIT/ML IJ SOLN
INTRAMUSCULAR | Status: AC
  Filled 2018-12-27: qty 1

## 2018-12-27 MED ORDER — TIROFIBAN HCL IN NACL 5-0.9 MG/100ML-% IV SOLN
0.1500 ug/kg/min | INTRAVENOUS | Status: DC
  Filled 2018-12-27: qty 100

## 2018-12-27 MED ORDER — FLUTICASONE PROPIONATE 50 MCG/ACT NA SUSP
1.0000 | Freq: Every day | NASAL | Status: DC
  Administered 2018-12-28 – 2018-12-29 (×2): 1 via NASAL
  Filled 2018-12-27: qty 16

## 2018-12-27 MED ORDER — IOHEXOL 300 MG/ML  SOLN
INTRAMUSCULAR | Status: DC | PRN
  Administered 2018-12-27: 120 mL

## 2018-12-27 MED ORDER — HEPARIN (PORCINE) IN NACL 1000-0.9 UT/500ML-% IV SOLN
INTRAVENOUS | Status: AC
  Filled 2018-12-27: qty 1000

## 2018-12-27 MED ORDER — NITROGLYCERIN 5 MG/ML IV SOLN
INTRAVENOUS | Status: AC
  Filled 2018-12-27: qty 10

## 2018-12-27 MED ORDER — TICAGRELOR 90 MG PO TABS
ORAL_TABLET | ORAL | Status: AC
  Filled 2018-12-27: qty 2

## 2018-12-27 MED ORDER — ONDANSETRON HCL 4 MG/2ML IJ SOLN: 4.0000 mg | Freq: Four times a day (QID) | INTRAMUSCULAR | Status: DC | PRN

## 2018-12-27 MED ORDER — MIDAZOLAM HCL 2 MG/2ML IJ SOLN
INTRAMUSCULAR | Status: AC
  Filled 2018-12-27: qty 2

## 2018-12-27 MED ORDER — FUROSEMIDE 10 MG/ML IJ SOLN
INTRAMUSCULAR | Status: AC
  Filled 2018-12-27: qty 4

## 2018-12-27 MED ORDER — IRBESARTAN 150 MG PO TABS
300.0000 mg | ORAL_TABLET | Freq: Every day | ORAL | Status: DC
  Administered 2018-12-28 – 2018-12-29 (×2): 300 mg via ORAL
  Filled 2018-12-27 (×2): qty 2

## 2018-12-27 MED ORDER — TIROFIBAN (AGGRASTAT) BOLUS VIA INFUSION
INTRAVENOUS | Status: DC | PRN
  Administered 2018-12-27: 1587.5 ug via INTRAVENOUS

## 2018-12-27 MED ORDER — SODIUM CHLORIDE 0.9% FLUSH
3.0000 mL | Freq: Two times a day (BID) | INTRAVENOUS | Status: DC
  Administered 2018-12-27 – 2018-12-29 (×4): 3 mL via INTRAVENOUS

## 2018-12-27 MED ORDER — POTASSIUM CHLORIDE CRYS ER 20 MEQ PO TBCR
40.0000 meq | EXTENDED_RELEASE_TABLET | ORAL | Status: AC
  Administered 2018-12-27 (×2): 40 meq via ORAL
  Filled 2018-12-27 (×2): qty 2

## 2018-12-27 MED ORDER — ASPIRIN 81 MG PO CHEW
81.0000 mg | CHEWABLE_TABLET | Freq: Every day | ORAL | Status: DC
  Administered 2018-12-28 – 2018-12-29 (×2): 81 mg via ORAL
  Filled 2018-12-27 (×2): qty 1

## 2018-12-27 MED ORDER — HEPARIN (PORCINE) IN NACL 1000-0.9 UT/500ML-% IV SOLN
INTRAVENOUS | Status: DC | PRN
  Administered 2018-12-27: 1000 mL

## 2018-12-27 MED ORDER — LABETALOL HCL 5 MG/ML IV SOLN: 10.0000 mg | INTRAVENOUS | Status: AC | PRN

## 2018-12-27 MED ORDER — HYDRALAZINE HCL 20 MG/ML IJ SOLN
10.0000 mg | INTRAMUSCULAR | Status: AC | PRN
  Administered 2018-12-28: 10 mg via INTRAVENOUS
  Filled 2018-12-27: qty 1

## 2018-12-27 MED ORDER — ATORVASTATIN CALCIUM 20 MG PO TABS
20.0000 mg | ORAL_TABLET | Freq: Every day | ORAL | Status: DC
  Administered 2018-12-28: 20 mg via ORAL
  Filled 2018-12-27: qty 1

## 2018-12-27 MED ORDER — AZELASTINE HCL 0.1 % NA SOLN
1.0000 | Freq: Every day | NASAL | Status: DC
  Administered 2018-12-27 – 2018-12-28 (×2): 1 via NASAL
  Filled 2018-12-27 (×2): qty 30

## 2018-12-27 MED ORDER — FENTANYL CITRATE (PF) 100 MCG/2ML IJ SOLN
INTRAMUSCULAR | Status: DC | PRN
  Administered 2018-12-27: 25 ug via INTRAVENOUS

## 2018-12-27 MED ORDER — IRBESARTAN 150 MG PO TABS
150.0000 mg | ORAL_TABLET | Freq: Every day | ORAL | Status: DC
  Administered 2018-12-27: 150 mg via ORAL
  Filled 2018-12-27: qty 1

## 2018-12-27 MED ORDER — FUROSEMIDE 10 MG/ML IJ SOLN
20.0000 mg | Freq: Every day | INTRAMUSCULAR | Status: DC
  Administered 2018-12-27 – 2018-12-29 (×3): 20 mg via INTRAVENOUS
  Filled 2018-12-27 (×3): qty 2

## 2018-12-27 MED ORDER — ACETAMINOPHEN 325 MG PO TABS: 650.0000 mg | ORAL_TABLET | ORAL | Status: DC | PRN

## 2018-12-27 MED ORDER — FENTANYL CITRATE (PF) 100 MCG/2ML IJ SOLN
INTRAMUSCULAR | Status: AC
  Filled 2018-12-27: qty 2

## 2018-12-27 MED ORDER — HEPARIN SODIUM (PORCINE) 1000 UNIT/ML IJ SOLN
INTRAMUSCULAR | Status: DC | PRN
  Administered 2018-12-27 (×2): 3000 [IU] via INTRAVENOUS

## 2018-12-27 MED ORDER — LORATADINE 10 MG PO TABS
10.0000 mg | ORAL_TABLET | Freq: Every day | ORAL | Status: DC
  Administered 2018-12-28 – 2018-12-29 (×2): 10 mg via ORAL
  Filled 2018-12-27 (×3): qty 1

## 2018-12-27 SURGICAL SUPPLY — 16 items
BALLN MINITREK RX 2.0X12 (BALLOONS) ×3
BALLN ~~LOC~~ TREK RX 3.25X12 (BALLOONS) ×3
BALLOON MINITREK RX 2.0X12 (BALLOONS) IMPLANT
BALLOON ~~LOC~~ TREK RX 3.25X12 (BALLOONS) IMPLANT
CATH INFINITI JR4 5F (CATHETERS) ×2 IMPLANT
CATH LAUNCHER 6FR EBU 3 (CATHETERS) ×2 IMPLANT
DEVICE INFLAT 30 PLUS (MISCELLANEOUS) ×2 IMPLANT
DEVICE RAD TR BAND REGULAR (VASCULAR PRODUCTS) ×2 IMPLANT
GLIDESHEATH SLEND SS 6F .021 (SHEATH) ×2 IMPLANT
KIT MANI 3VAL PERCEP (MISCELLANEOUS) ×3 IMPLANT
PACK CARDIAC CATH (CUSTOM PROCEDURE TRAY) ×3 IMPLANT
STENT RESOLUTE ONYX 3.0X18 (Permanent Stent) ×2 IMPLANT
WIRE G INSERTION TOOL (WIRE) ×2 IMPLANT
WIRE HI TORQ WHISPER MS 190CM (WIRE) ×2 IMPLANT
WIRE ROSEN-J .035X260CM (WIRE) ×2 IMPLANT
WIRE RUNTHROUGH .014X180CM (WIRE) ×2 IMPLANT

## 2018-12-27 NOTE — Progress Notes (Signed)
   12/27/18 1910  Clinical Encounter Type  Visited With Patient  Visit Type Initial  Referral From Nurse  Consult/Referral To Chaplain  Spiritual Encounters  Spiritual Needs Prayer;Emotional  Patient was alert and awake upon arrival. [facility] introduced self and completed initial spiritual care screening. Patient has strong emotional and spiritual support system in community. Ch provided pastoral care through building rapport and prayer upon request. Pastoral visit was appreciated. No further needs at this time.

## 2018-12-27 NOTE — Progress Notes (Signed)
[facility]  Date: 12/27/18 Time: 9:06 PM  I was alerted by the patient's nurse that right forearm hematoma was noted at approximately 7:30 PM.  Patient was assessed and noted to have hematoma proximal to the TR band.  Normal pulse ox reading noted in the right hand.  Right hand sensation intact.  Manual pressure was applied for about 10 minutes, after which a second TR band was applied proximal to the original TR band.  Both TR bands were inflated to 16 mL of air on reassessment at approximately 8:50 PM, hematoma was stable with TR band deflation in process.  Of note, tirofiban infusion was held when bleeding first developed.  Patient also noted to be hypertensive; will give additional dose of ARB tonight and escalate standing dose to begin tomorrow morning.  As needed hydralazine also available.  Nelva Bush, MD [facility] Pager: [phone]

## 2018-12-27 NOTE — ED Triage Notes (Signed)
1702-dr end at bedside on pt arrival from EMS. Started with chest pain today.  324 mg ASA by EMS along with NTG spray X 2 and 1.5 in NTG paste. Has some SHOB with pain. Prior to arrival had diaphoresis.  EKG 1705 in ED
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
## 2018-12-27 NOTE — ED Notes (Signed)
defib pads on pt 
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
## 2018-12-27 NOTE — ED Notes (Signed)
Pt transported to Cath lab with RN and MD

## 2018-12-27 NOTE — ED Notes (Signed)
Pt to cath lab with this RN. defib pads and dr end

## 2018-12-27 NOTE — Plan of Care (Signed)
Mrs. Budzinski is a 83 y.o. Female with a PMH notable for HTN, Hyperlipidemia, and mild aortic stenosis who presented to [facility] ED on 12/27/18 with complaints of chest pain. She described the chest pain as substernal, without radiation, occurring at rest.  She was noted to have ST Elevation in Anterior leads, therefore CODE STEMI was called.  She underwent emergent cardiac catheterization, which revealed occlusion of the proximal LAD.  She was treated successfully with PCI using a single drug-eluting stent.  She returned to ICU post cath, and is now chest pain free and hemodynamically stable.  She is requiring 5L Nasal cannula for Acute Hypoxic Respiratory Failure in the setting of Acute HFrEF due to ischemic cardiomyopathy.  She currently denies shortness of breath.  Cardiology is primary service.  No official consult for PCCM at this time.  PCCM is available as needed for any critical care needs.   Darel Hong, AGACNP-BC [facility] Pager: [phone] Cell: [phone]
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
## 2018-12-27 NOTE — Progress Notes (Signed)
Family Meeting Note  Advance Directive:yes  Today a meeting took place with the Patient.  Patient is able to participate.  The following clinical team members were present during this meeting:MD  The following were discussed:Patient's diagnosis: STEMI, Patient's progosis: Unable to determine and Goals for treatment: Full Code  Additional follow-up to be provided: prn  Time spent during discussion:20 minutes  Evette Doffing, MD

## 2018-12-28 ENCOUNTER — Encounter: Payer: Self-pay | Admitting: Internal Medicine

## 2018-12-28 LAB — BASIC METABOLIC PANEL
Anion gap: 13 (ref 5–15)
BUN: 17 mg/dL (ref 8–23)
CO2: 19 mmol/L — ABNORMAL LOW (ref 22–32)
Calcium: 9 mg/dL (ref 8.9–10.3)
Chloride: 103 mmol/L (ref 98–111)
Creatinine, Ser: 0.66 mg/dL (ref 0.44–1.00)
GFR calc Af Amer: >60 mL/min (ref >60)
GFR calc non Af Amer: >60 mL/min (ref >60)
Glucose, Bld: 163 mg/dL — ABNORMAL HIGH (ref 70–99)
Potassium: 3.6 mmol/L (ref 3.5–5.1)
Sodium: 135 mmol/L (ref 135–145)

## 2018-12-28 LAB — CBC
HCT: 41 % (ref 36.0–46.0)
Hemoglobin: 13.8 g/dL (ref 12.0–15.0)
MCH: 29.9 pg (ref 26.0–34.0)
MCHC: 33.7 g/dL (ref 30.0–36.0)
MCV: 88.7 fL (ref 80.0–100.0)
Platelets: 344 10*3/uL (ref 150–400)
RBC: 4.62 MIL/uL (ref 3.87–5.11)
RDW: 13 % (ref 11.5–15.5)
WBC: 11.9 10*3/uL — ABNORMAL HIGH (ref 4.0–10.5)
nRBC: 0 % (ref 0.0–0.2)

## 2018-12-28 LAB — TROPONIN I (HIGH SENSITIVITY)
Troponin I (High Sensitivity): >27000 ng/L (ref <18)
Troponin I (High Sensitivity): >27000 ng/L (ref <18)
Troponin I (High Sensitivity): >27000 ng/L (ref <18)

## 2018-12-28 LAB — HEMOGLOBIN A1C
Hgb A1c MFr Bld: 5.8 % — ABNORMAL HIGH (ref 4.8–5.6)
Mean Plasma Glucose: 119.76 mg/dL

## 2018-12-28 LAB — MRSA PCR SCREENING: MRSA by PCR: NEGATIVE

## 2018-12-28 LAB — ECHOCARDIOGRAM COMPLETE: Weight: 2240 oz

## 2018-12-28 MED ORDER — TICAGRELOR 90 MG PO TABS
90.0000 mg | ORAL_TABLET | Freq: Two times a day (BID) | ORAL | Status: DC
  Administered 2018-12-28 – 2018-12-29 (×3): 90 mg via ORAL
  Filled 2018-12-28 (×3): qty 1

## 2018-12-28 NOTE — Progress Notes (Signed)
Pt transferred her from CCU. Pt has not complaints of pain, NSR on telemetry and s/p PCI July 14. Pts right radial site is bruised and has visible hematoma upon transfer. RN outlines hematoma and will continue to assess.

## 2018-12-28 NOTE — Plan of Care (Signed)
  Problem: Education: Goal: Knowledge of General Education information will improve Description: Including pain rating scale, medication(s)/side effects and non-pharmacologic comfort measures Outcome: Progressing   Problem: Pain Managment: Goal: General experience of comfort will improve Outcome: Progressing   

## 2018-12-28 NOTE — Progress Notes (Signed)
Patient Name: Teresa Moyer Date of Encounter: 12/28/2018  Hospital Problem List     Principal Problem:   STEMI (ST elevation myocardial infarction) [facility]) Active Problems:   STEMI involving left anterior descending coronary artery [facility])    Patient Profile     83 year old female with history of hypertension, hyperlipidemia and mild aortic stenosis who presented to emergency room with severe substernal chest pain without radiation at rest associated with shortness of breath.  She had anterior ST elevation felt to be consistent with an ST elevated myocardial infarction.  She was transferred emergently to the cardiac catheterization lab under the direction of Dr. Harrell Gave end and was noted to have an occlusion of the proximal LAD which was treated successfully with a drug-eluting stent.  She received a resolute onyx 3.0 x 18 mm device with TIMI-3 flow post procedure.  There was nonocclusive residual thrombus in the ostium of the first diagonal.  She had a 60% stenosis in the mid RCA with insignificant disease in the left circumflex.  She has done well post PCI with some mild heaviness but no severe chest pain and no ischemic electrical changes.  Her high-sensitivity troponin is peaked to greater than 27,000.  She is hemodynamically stable.  We will continue with dual antiplatelet therapy with ticagrelor at 90 mg twice daily, high intensity statin, carvedilol 6.25 mg twice daily, irbesartan 300 mg daily  Subjective   Mild chest heaviness but no chest pain.  Inpatient Medications    . aspirin  81 mg Oral Daily  . atorvastatin  20 mg Oral q1800  . azelastine  1 spray Each Nare QHS  . carvedilol  6.25 mg Oral BID WC  . darifenacin  7.5 mg Oral Daily  . enoxaparin (LOVENOX) injection  40 mg Subcutaneous Q24H  . fenofibrate  80 mg Oral Daily  . fluticasone  1 spray Each Nare Daily  . furosemide  20 mg Intravenous Daily  . irbesartan  300 mg Oral Daily  . loratadine  10 mg Oral Daily  .  pantoprazole  40 mg Oral Daily  . polyvinyl alcohol  1 drop Both Eyes QHS  . sodium chloride flush  3 mL Intravenous Q12H  . ticagrelor  90 mg Oral BID    Vital Signs    Vitals:   12/28/18 0300 12/28/18 0400 12/28/18 0500 12/28/18 0600  BP: 135/77 137/74  138/77  Pulse: 77 73 84 76  Resp: 18 19 16 19   Temp:  98.1 F (36.7 C)    TempSrc:  Oral    SpO2: 98% 99% 96% 98%  Weight:        Intake/Output Summary (Last 24 hours) at 12/28/2018 0819 Last data filed at 12/28/2018 0600 Gross per 24 hour  Intake 110 ml  Output -  Net 110 ml   Filed Weights   12/27/18 1703  Weight: 63.5 kg    Physical Exam    GEN: Well nourished, well developed, in no acute distress.  HEENT: normal.  Neck: Supple, no JVD, carotid bruits, or masses. Cardiac: RRR, no murmurs, rubs, or gallops. No clubbing, cyanosis, edema.  Radials/DP/PT 2+ and equal bilaterally.  Respiratory:  Respirations regular and unlabored, clear to auscultation bilaterally. GI: Soft, nontender, nondistended, BS + x 4. MS: no deformity or atrophy. Skin: warm and dry, no rash. Neuro:  Strength and sensation are intact. Psych: Normal affect.  Labs    CBC Recent Labs    12/27/18 1711 12/28/18 0242  WBC 6.7 11.9*  NEUTROABS 3.5  --   HGB 12.4 13.8  HCT 37.2 41.0  MCV 90.7 88.7  PLT 288 938   Basic Metabolic Panel Recent Labs    12/27/18 1711 12/28/18 0242  NA 137 135  K 3.3* 3.6  CL 105 103  CO2 21* 19*  GLUCOSE 195* 163*  BUN 19 17  CREATININE 0.75 0.66  CALCIUM 8.6* 9.0   Liver Function Tests No results for input(s): AST, ALT, ALKPHOS, BILITOT, PROT, ALBUMIN in the last 72 hours. No results for input(s): LIPASE, AMYLASE in the last 72 hours. Cardiac Enzymes No results for input(s): CKTOTAL, CKMB, CKMBINDEX, TROPONINI in the last 72 hours. BNP No results for input(s): BNP in the last 72 hours. D-Dimer No results for input(s): DDIMER in the last 72 hours. Hemoglobin A1C No results for input(s): HGBA1C  in the last 72 hours. Fasting Lipid Panel No results for input(s): CHOL, HDL, LDLCALC, TRIG, CHOLHDL, LDLDIRECT in the last 72 hours. Thyroid Function Tests No results for input(s): TSH, T4TOTAL, T3FREE, THYROIDAB in the last 72 hours.  Invalid input(s): FREET3  Telemetry    Sinus rhythm  ECG    Sinus rhythm with anterior ST elevation on presentation.  Radiology    Dg Chest Port 1 View  Result Date: 12/27/2018 CLINICAL DATA:  Shortness of breath EXAM: PORTABLE CHEST 1 VIEW COMPARISON:  12/18/2014 FINDINGS: Heart is borderline in size. There is peribronchial thickening and diffuse interstitial opacities, new since prior study. No effusions. No acute bony abnormality. IMPRESSION: Peribronchial thickening and interstitial prominence throughout the lungs could reflect bronchitis or atypical/viral infection. Electronically Signed   By: Rolm Baptise M.D.   On: 12/27/2018 19:44    Assessment & Plan    STEMI-s/p anterior myocardial infarction with a proximal 100% stenosis of the LAD.  Status post placement of an Onyx 3.0 x 18 mm stent.  Had some residual nonocclusive disease in her RCA.  Currently is stable on dual antiplatelet therapy with aspirin and ticagrelor.  Is on beta-blocker as well as afterload reduction with irbesartan.  Will increase her atorvastatin to 40 mg daily.  Would transfer to telemetry and follow for the next 24 hours with consideration for discharge tomorrow if stable.  Will need phase 2 cardiac rehab.  Signed, Javier Docker Ernesto Zukowski MD 12/28/2018, 8:19 AM  Pager: [phone]

## 2018-12-28 NOTE — Progress Notes (Signed)
[place] [facility] [place] NAME: Ananiah Maciolek    MR#:  [id number]  DATE OF BIRTH:  August 01, 1935  SUBJECTIVE:  CHIEF COMPLAINT:   Chief Complaint  Patient presents with  . Code STEMI  minimal chest pressure, right forearm hematoma last evening/night which is controlled now REVIEW OF SYSTEMS:  Review of Systems  Constitutional: Negative for diaphoresis, fever, malaise/fatigue and weight loss.  HENT: Negative for ear discharge, ear pain, hearing loss, nosebleeds, sore throat and tinnitus.   Eyes: Negative for blurred vision and pain.  Respiratory: Negative for cough, hemoptysis, shortness of breath and wheezing.   Cardiovascular: Positive for chest pain. Negative for palpitations, orthopnea and leg swelling.  Gastrointestinal: Negative for abdominal pain, blood in stool, constipation, diarrhea, heartburn, nausea and vomiting.  Genitourinary: Negative for dysuria, frequency and urgency.  Musculoskeletal: Negative for back pain and myalgias.  Skin: Negative for itching and rash.  Neurological: Negative for dizziness, tingling, tremors, focal weakness, seizures, weakness and headaches.  Psychiatric/Behavioral: Negative for depression. The patient is not nervous/anxious.     DRUG ALLERGIES:   Allergies  Allergen Reactions  . Accupril [Quinapril Hcl] Hives and Swelling    SWELLING OF NOSE/FACE  . Sulfa Antibiotics Shortness Of Breath and Swelling  . Clinoril [Sulindac] Other (See Comments)    Unsure of reaction type  . Hydrochlorothiazide Other (See Comments)    Unsure of reaction type  . Tessalon [Benzonatate] Other (See Comments)    Unsure of reaction type  . Amoxicillin Rash    Has patient had a PCN reaction causing immediate rash, facial/tongue/throat swelling, SOB or lightheadedness with hypotension: Unknown Has patient had a PCN reaction causing severe rash involving mucus membranes or skin necrosis: Unknown Has patient had a PCN  reaction that required hospitalization: No Has patient had a PCN reaction occurring within the last 10 years:Possibly unsure  If all of the above answers are "NO", then may proceed with Cephalosporin use.   . Codeine Sulfate Nausea And Vomiting  . Penicillins Rash    Has patient had a PCN reaction causing immediate rash, facial/tongue/throat swelling, SOB or lightheadedness with hypotension: Unknown Has patient had a PCN reaction causing severe rash involving mucus membranes or skin necrosis: Unknown Has patient had a PCN reaction that required hospitalization: No Has patient had a PCN reaction occurring within the last 10 years:Possibly unsure  If all of the above answers are "NO", then may proceed with Cephalosporin use.   VITALS:  Blood pressure (!) 147/72, pulse 87, temperature 98.4 F (36.9 C), temperature source Oral, resp. rate 18, height 5\' 2"  (1.575 m), weight 64.5 kg, SpO2 99 %. PHYSICAL EXAMINATION:  Physical Exam HENT:     Head: Normocephalic and atraumatic.  Eyes:     Conjunctiva/sclera: Conjunctivae normal.     Pupils: Pupils are equal, round, and reactive to light.  Neck:     Musculoskeletal: Normal range of motion and neck supple.     Thyroid: No thyromegaly.     Trachea: No tracheal deviation.  Cardiovascular:     Rate and Rhythm: Normal rate and regular rhythm.     Heart sounds: Normal heart sounds.  Pulmonary:     Effort: Pulmonary effort is normal. No respiratory distress.     Breath sounds: Normal breath sounds. No wheezing.  Chest:     Chest wall: No tenderness.  Abdominal:     General: Bowel sounds are normal. There is no distension.     Palpations:  Abdomen is soft.     Tenderness: There is no abdominal tenderness.  Musculoskeletal: Normal range of motion.  Skin:    General: Skin is warm and dry.     Findings: Bruising, ecchymosis and erythema present. No rash.     Comments: right forearm hematoma   Neurological:     Mental Status: She is alert and  oriented to person, place, and time.     Cranial Nerves: No cranial nerve deficit.    LABORATORY PANEL:  Female CBC Recent Labs  Lab 12/28/18 0242  WBC 11.9*  HGB 13.8  HCT 41.0  PLT 344   ------------------------------------------------------------------------------------------------------------------ Chemistries  Recent Labs  Lab 12/28/18 0242  NA 135  K 3.6  CL 103  CO2 19*  GLUCOSE 163*  BUN 17  CREATININE 0.66  CALCIUM 9.0   RADIOLOGY:  Dg Chest Port 1 View  Result Date: 12/27/2018 CLINICAL DATA:  Shortness of breath EXAM: PORTABLE CHEST 1 VIEW COMPARISON:  12/18/2014 FINDINGS: Heart is borderline in size. There is peribronchial thickening and diffuse interstitial opacities, new since prior study. No effusions. No acute bony abnormality. IMPRESSION: Peribronchial thickening and interstitial prominence throughout the lungs could reflect bronchitis or atypical/viral infection. Electronically Signed   By: Rolm Baptise M.D.   On: 12/27/2018 19:44   ASSESSMENT AND PLAN:   Acute anterior STEMI- s/p PCI with DES to the mid LAD. -Cardiology following -Continue aspirin and brilinta for at least 12 months -Continue Lipitor 20 mg daily  Acute hypoxic respiratory failure secondary to acute systolic CHF due to ischemic cardiomyopathy- noted to have reduced EF during catheterization -ECHO pending -Continue Lasix 20 mg IV daily -Continue Coreg and irbesartan at reduced dose -Wean O2 as able  Hypokalemia -Repleted  Hypertension-BP mildly elevated -Continue Coreg and irbesartan -Hydralazine and labetalol IV as needed     All the records are reviewed and case discussed with Care Management/Social Worker. Management plans discussed with the patient, nursing and they are in agreement.  CODE STATUS: Full Code  TOTAL TIME TAKING CARE OF THIS PATIENT: 15 minutes.   More than 50% of the time was spent in counseling/coordination of care: YES  POSSIBLE D/C IN 1-2 DAYS,  DEPENDING ON CLINICAL CONDITION.   Max Sane M.D on 12/28/2018 at 6:34 PM  Between 7am to 6pm - Pager - [phone]  After 6pm go to [email or web address] - password [email or web address]  [employer] [place] [facility]  Office  [phone]  CC: Primary care physician; Jerrol Banana., MD  Note: This dictation was prepared with Dragon dictation along with smaller phrase technology. Any transcriptional errors that result from this process are unintentional.

## 2018-12-29 LAB — BASIC METABOLIC PANEL
Anion gap: 10 (ref 5–15)
BUN: 21 mg/dL (ref 8–23)
CO2: 22 mmol/L (ref 22–32)
Calcium: 9.1 mg/dL (ref 8.9–10.3)
Chloride: 103 mmol/L (ref 98–111)
Creatinine, Ser: 0.86 mg/dL (ref 0.44–1.00)
GFR calc Af Amer: >60 mL/min (ref >60)
GFR calc non Af Amer: >60 mL/min (ref >60)
Glucose, Bld: 112 mg/dL — ABNORMAL HIGH (ref 70–99)
Potassium: 3.9 mmol/L (ref 3.5–5.1)
Sodium: 135 mmol/L (ref 135–145)

## 2018-12-29 LAB — CBC
HCT: 39.4 % (ref 36.0–46.0)
Hemoglobin: 13 g/dL (ref 12.0–15.0)
MCH: 30.2 pg (ref 26.0–34.0)
MCHC: 33 g/dL (ref 30.0–36.0)
MCV: 91.6 fL (ref 80.0–100.0)
Platelets: 289 10*3/uL (ref 150–400)
RBC: 4.3 MIL/uL (ref 3.87–5.11)
RDW: 13.4 % (ref 11.5–15.5)
WBC: 10.2 10*3/uL (ref 4.0–10.5)
nRBC: 0 % (ref 0.0–0.2)

## 2018-12-29 MED ORDER — IRBESARTAN 300 MG PO TABS: 150.0000 mg | ORAL_TABLET | Freq: Every day | ORAL | 0 refills | Status: DC

## 2018-12-29 MED ORDER — ATORVASTATIN CALCIUM 20 MG PO TABS: 40.0000 mg | ORAL_TABLET | Freq: Every day | ORAL | Status: DC

## 2018-12-29 MED ORDER — ATORVASTATIN CALCIUM 40 MG PO TABS: 40.0000 mg | ORAL_TABLET | Freq: Every day | ORAL | 0 refills | Status: AC

## 2018-12-29 MED ORDER — ASPIRIN 81 MG PO CHEW: 81.0000 mg | CHEWABLE_TABLET | Freq: Every day | ORAL | 0 refills | Status: DC

## 2018-12-29 MED ORDER — TICAGRELOR 90 MG PO TABS: 90.0000 mg | ORAL_TABLET | Freq: Two times a day (BID) | ORAL | 0 refills | Status: DC

## 2018-12-29 MED ORDER — CARVEDILOL 6.25 MG PO TABS: 6.2500 mg | ORAL_TABLET | Freq: Two times a day (BID) | ORAL | 0 refills | Status: DC

## 2018-12-29 NOTE — TOC Transition Note (Signed)
Transition of Care [facility]) - CM/SW Discharge Note   Patient Details  Name: Teresa Moyer MRN: [id number] Date of Birth: Apr 19, 1936  [facility] CM/SW Contact:  Elza Rafter, RN Phone Number: 12/29/2018, 12:34 PM   Clinical Narrative:   Independent from home with spouse.  Will be discharging home today with Brilinta.  Verified insurance coverage.  She has a deductible of appx. $150.  Prescription will be $47/mo thereafter.  Patient is able to afford this.  Provided 30 free coupon.  Obtains prescriptions at [facility] without difficulty.  Current with PCP.  No issues with transportation or housing.  Husband or sister will transport home today.  No further needs identified at this time by CM.      Final next level of care: Home/Self Care Barriers to Discharge: No Barriers Identified   Patient Goals and CMS Choice        Discharge Placement                       Discharge Plan and Services   Discharge Planning Services: CM Consult                                 Social Determinants of Health (SDOH) Interventions     Readmission Risk Interventions No flowsheet data found.

## 2018-12-29 NOTE — Plan of Care (Signed)
Nutrition Education Note  RD consulted for nutrition education regarding a Heart Healthy diet.   83 year old female with hx of GERD and HTN who presented to the ED with chest pain.  Ruled in for STEMI.  Underwent Cardiac Cath on 12/27/2018.  Lipid Panel     Component Value Date/Time   CHOL 163 11/18/2018 1009   TRIG 127 11/18/2018 1009   HDL 77 11/18/2018 1009   CHOLHDL 2.1 11/18/2018 1009   LDLCALC 61 11/18/2018 1009    RD provided "Heart Healthy Nutrition Therapy" handout from the Academy of [employer]etics. Reviewed patient's dietary recall. Provided examples on ways to decrease sodium and fat intake in diet. Discouraged intake of processed foods and use of salt shaker. Encouraged fresh fruits and vegetables as well as whole grain sources of carbohydrates to maximize fiber intake. Teach back method used.  Expect good compliance.  Body mass index is 26.01 kg/m. Pt meets criteria for normal weight for age based on current BMI.  Current diet order is HH, patient is consuming approximately 100% of meals at this time. Labs and medications reviewed. No further nutrition interventions warranted at this time. RD contact information provided. If additional nutrition issues arise, please re-consult RD.  Koleen Distance MS, RD, LDN Pager #- [phone] Office#- [phone] After Hours Pager: [phone]

## 2018-12-29 NOTE — Progress Notes (Addendum)
Cardiovascular and Pulmonary Nurse Navigator Note:    83 year old female with hx of GERD and HTN who presented to the ED with chest pain.  Ruled in for STEMI.  Underwent Cardiac Cath on 12/27/2018.  S/P PCI with DES to the mid LAD.  Patient initially with acute hypoxic respiratory failure secondary to acute systolic CHF due to ischemic cardiomyopathy - noted to have reduced EF  30 - 35%) during catheterization.  Patient receiving IV Lasix 20 mg.  Patient was on Lasix prior to admission.  Echo performed on 12/27/2018 post cardiac cath with PCI, which revealed an EF of 45-50%.    EDUCATION:   "Heart Attack Bouncing Back" booklet given and reviewed with patient. Discussed the definition of CAD. Reviewed the location of CAD and where her stent was placed. Informed patient she will be given a stent card. Explained the purpose of the stent card. Instructed patient to keep stent card in her wallet.   Discussed modifiable risk factors including controlling blood pressure, cholesterol, and blood sugar; following heart healthy diet; maintaining healthy weight; exercise; and smoking cessation, if applicable.    Discussed cardiac medications including rationale for taking, mechanisms of action, and side effects. Stressed the importance of taking medications as prescribed.  Patient concerned about the cost of Brilinta.  Care Management Consult placed for assistance with benefit check and cost of the med.    Discussed emergency plan for heart attack symptoms. Patient verbalized understanding of need to call 911 and not to drive himself to ER if having cardiac symptoms / chest pain.   Diet of low sodium heart healthy diet discussed.   Note: Dietitian Consultation entered, but has not been performed at the time of the writing of this note.    Smoking Cessation - Patient is a nonsmoker.  Exercise - Benefits of exercised discussed.  Patient reports exercising at home and doing exercises for her balance and sometimes  walks on the treadmill.   Patient informed this RN she occasionally ambulates with a cane due to her bad knee.  Note:  Patient has previously had partial knee replacement on right and has arthritis of her left knee that bothers her from time-to-time.   Informed patient her cardiologist has referred her to outpatient Cardiac Rehab. An overview of the program was provided. Brochure, informational letter and CPT billing codes provided to patient.   Patient is interested in participating and wanted to go ahead and schedule her first appt which will be a virtual phone call with the Cardiac Rehab RN for orientation.  Appt scheduled for Monday, 01/02/2019 at 3:30 p.m. and an in-person appt on 01/05/2019 at 1:00 p.m. with Exercise Physiologist, RD, and for 6 minute walk.  Patient verbalized understanding.    Roanna Epley, RN, BSN, [facility] Cardiovascular and Pulmonary Nurse Navigator Cardiac and Pulmonary Rehab Dept.   Department Phone #: [phone] Fax: [phone] Email Address: [email or web address]

## 2018-12-29 NOTE — Discharge Instructions (Signed)
Heart Attack A heart attack occurs when blood and oxygen supply to the heart is cut off. A heart attack causes damage to the heart that cannot be fixed. A heart attack is also called a myocardial infarction, or MI. If you think you are having a heart attack, do not wait to see if the symptoms will go away. Get medical help right away. What are the causes? This condition may be caused by:  A fatty substance (plaque) in the blood vessels (arteries). This can block the flow of blood to the heart.  A blood clot in the blood vessels that go to the heart. The blood clot blocks blood flow.  Low blood pressure.  An abnormal heartbeat.  Some diseases, such as problems in red blood cells (anemia)orproblems in breathing (respiratory failure).  Tightening (spasm) of a blood vessel that cuts off blood to the heart.  A tear in a blood vessel of the heart.  High blood pressure. What increases the risk? The following factors may make you more likely to develop this condition:  Aging. The older you are, the higher your risk.  Having a personal or family history of chest pain, heart attack, stroke, or narrowing of the arteries in the legs, arms, head, or stomach (peripheral artery disease).  Being female.  Smoking.  Not getting regular exercise.  Being overweight or obese.  Having high blood pressure.  Having high cholesterol.  Having diabetes.  Drinking too much alcohol.  Using illegal drugs, such as cocaine or methamphetamine. What are the signs or symptoms? Symptoms of this condition include:  Chest pain. It may feel like: ? Crushing or squeezing. ? Tightness, pressure, fullness, or heaviness.  Pain in the arm, neck, jaw, back, or upper body.  Shortness of breath.  Heartburn.  Upset stomach (indigestion).  Feeling like you may vomit (nauseous).  Cold sweats.  Feeling tired.  Sudden light-headedness. How is this treated? A heart attack must be treated as soon as  possible. Treatment may include:  Medicines to: ? Break up or dissolve blood clots. ? Thin blood and help prevent blood clots. ? Treat blood pressure. ? Improve blood flow to the heart. ? Reduce pain. ? Reduce cholesterol.  Procedures to widen a blocked artery and keep it open.  Open heart surgery.  Receiving oxygen.  Making your heart strong again (cardiac rehabilitation) through exercise, education, and counseling. Follow these instructions at home: Medicines  Take over-the-counter and prescription medicines only as told by your doctor. You may need to take medicine: ? To keep your blood from clotting too easily. ? To control blood pressure. ? To lower cholesterol. ? To control heart rhythms.  Do not take these medicines unless your doctor says it is okay: ? NSAIDs, such as ibuprofen. ? Supplements that have vitamin A, vitamin E, or both. ? Hormone replacement therapy that has estrogen with or without progestin. Lifestyle      Do not use any products that have nicotine or tobacco, such as cigarettes, e-cigarettes, and chewing tobacco. If you need help quitting, ask your doctor.  Avoid secondhand smoke.  Exercise regularly. Ask your doctor about a cardiac rehab program.  Eat heart-healthy foods. Your doctor will tell you what foods to eat.  Stay at a healthy weight.  Lower your stress level.  Do not use illegal drugs. Alcohol use  Do not drink alcohol if: ? Your doctor tells you not to drink. ? You are pregnant, may be pregnant, or are planning to become pregnant.  If you drink alcohol: ? Limit how much you use to:  0-1 drink a day for women.  0-2 drinks a day for men. ? Know how much alcohol is in your drink. In the [place] one drink equals one 12 oz bottle of beer (355 mL), one 5 oz glass of wine (148 mL), or one 1 oz glass of hard liquor (44 mL). General instructions  Work with your doctor to treat other problems you may have, such as diabetes or high  blood pressure.  Get screened for depression. Get treatment if needed.  Keep your vaccines up to date. Get the flu shot (influenza vaccine) every year.  Keep all follow-up visits as told by your doctor. This is important. Contact a doctor if:  You feel very sad.  You have trouble doing your daily activities. Get help right away if:  You have sudden, unexplained discomfort in your chest, arms, back, neck, jaw, or upper body.  You have shortness of breath.  You have sudden sweating or clammy skin.  You feel like you may vomit.  You vomit.  You feel tired or weak.  You get light-headed or dizzy.  You feel your heart beating fast.  You feel your heart skipping beats.  You have blood pressure that is higher than 180/120. These symptoms may be an emergency. Do not wait to see if the symptoms will go away. Get medical help right away. Call your local emergency services (911 in the [place] Do not drive yourself to the hospital. Summary  A heart attack occurs when blood and oxygen supply to the heart is cut off.  Do not take NSAIDs unless your doctor says it is okay.  Do not smoke. Avoid secondhand smoke.  Exercise regularly. Ask your doctor about a cardiac rehab program. This information is not intended to replace advice given to you by your health care provider. Make sure you discuss any questions you have with your health care provider. Document Released: 12/01/2011 Document Revised: 09/12/2018 Document Reviewed: 09/12/2018 Elsevier Patient Education  [place].

## 2018-12-29 NOTE — Progress Notes (Signed)
[facility] Card[facility]  SUBJECTIVE: Teresa Moyer is an 83 year old female with a past medical history significant for hypertension, hyperlipidemia, and mild aortic stenosis who presented to the ED on 12/27/18 for acute onset of severe substernal chest pain.  Husband called EMS and ECG revealed anterior ST elevation.  She was referred for emergent cardiac catheterization with Dr. Harrell Gave End which revealed occlusion of the proximal LAD which was successfully treated with PCI with a Resolute Onyx 3.0 x 55mm DES.   She has been doing well post procedure, and today, [name] denies chest pain, palpitations, shortness of breath, or lower extremity swelling.  She is sitting up in bed, eating breakfast, in no distress.   Vitals:   12/28/18 1603 12/28/18 1929 12/29/18 0318 12/29/18 0739  BP: (!) 147/72 139/66 123/70 139/76  Pulse: 87 93 90 85  Resp: 18 16 16 18   Temp: 98.4 F (36.9 C) 98.7 F (37.1 C) 98.6 F (37 C) 98 F (36.7 C)  TempSrc: Oral Oral Oral Oral  SpO2: 99% 97% 96% 96%  Weight:      Height:         Intake/Output Summary (Last 24 hours) at 12/29/2018 0827 Last data filed at 12/29/2018 0739 Gross per 24 hour  Intake 960 ml  Output 700 ml  Net 260 ml      PHYSICAL EXAM  General: Well developed, well nourished, in no acute distress HEENT:  Normocephalic and atramatic Neck:  No JVD.  Lungs: Clear bilaterally to auscultation and percussion. Heart: HRRR . Normal S1 and S2 without gallops or murmurs.  Abdomen: Bowel sounds are positive, abdomen soft and non-tender  Msk:  Back normal, normal gait. Normal strength and tone for age. Extremities: No clubbing, cyanosis or edema.   Neuro: Alert and oriented X 3. 5/5 grip strength in bilateral upper extremities Psych:  Good affect, responds appropriately Skin: Right radial access site with moderate to severe ecchymosis, but no evidence of edema, erythema, or drainage   LABS: Basic Metabolic Panel: Recent Labs    12/28/18 0242  12/29/18 0652  NA 135 135  K 3.6 3.9  CL 103 103  CO2 19* 22  GLUCOSE 163* 112*  BUN 17 21  CREATININE 0.66 0.86  CALCIUM 9.0 9.1   Liver Function Tests: No results for input(s): AST, ALT, ALKPHOS, BILITOT, PROT, ALBUMIN in the last 72 hours. No results for input(s): LIPASE, AMYLASE in the last 72 hours. CBC: Recent Labs    12/27/18 1711 12/28/18 0242 12/29/18 0652  WBC 6.7 11.9* 10.2  NEUTROABS 3.5  --   --   HGB 12.4 13.8 13.0  HCT 37.2 41.0 39.4  MCV 90.7 88.7 91.6  PLT 288 344 289   Cardiac Enzymes: No results for input(s): CKTOTAL, CKMB, CKMBINDEX, TROPONINI in the last 72 hours. BNP: Invalid input(s): POCBNP D-Dimer: No results for input(s): DDIMER in the last 72 hours. Hemoglobin A1C: Recent Labs    12/28/18 0242  HGBA1C 5.8*   Fasting Lipid Panel: No results for input(s): CHOL, HDL, LDLCALC, TRIG, CHOLHDL, LDLDIRECT in the last 72 hours. Thyroid Function Tests: No results for input(s): TSH, T4TOTAL, T3FREE, THYROIDAB in the last 72 hours.  Invalid input(s): FREET3 Anemia Panel: No results for input(s): VITAMINB12, FOLATE, FERRITIN, TIBC, IRON, RETICCTPCT in the last 72 hours.  Dg Chest Port 1 View  Result Date: 12/27/2018 CLINICAL DATA:  Shortness of breath EXAM: PORTABLE CHEST 1 VIEW COMPARISON:  12/18/2014 FINDINGS: Heart is borderline in size. There is peribronchial thickening  and diffuse interstitial opacities, new since prior study. No effusions. No acute bony abnormality. IMPRESSION: Peribronchial thickening and interstitial prominence throughout the lungs could reflect bronchitis or atypical/viral infection. Electronically Signed   By: Rolm Baptise M.D.   On: 12/27/2018 19:44     Echo: Mildly reduced LV function with an EF estimated between 45-50% with mild LVH.  Mild aortic stenosis.   TELEMETRY: Normal sinus rhythm  ASSESSMENT AND PLAN:  Principal Problem:   STEMI (ST elevation myocardial infarction) ([facility]) Active Problems:   STEMI  involving left anterior descending coronary artery ([place])    1.  STEMI s/p PCI   -Successful PCI with DES to proximal LAD   -Stressed the importance of uninterrupted dual antiplatelet therapy of aspirin and Brilinta for 1 year  -Continue carvedilol and irbesartan; will increase atorvastatin to 40mg  daily and follow for myalgias   -Stable to discharge from a cardiac standpoint; cardiac rehab recommended;  follow up with Dr. Nehemiah Massed or Hilbert Odor within one week   2.  Aortic stenosis  -Mild per recent echocardiogram; continue to monitor in an outpatient setting  3.  Hypertension   -Will d/c amlodipine; continue carvedilol 6.25mg  twice daily and irbesartan 150mg  once daily  4.  Hyperlipidemia   -Atorvastatin increased to 40mg  daily; patient has a history of myalgias with statins, so we will monitor this is an outpatient setting and adjust dosing as needed   The history, physical exam findings, and plan of care were all discussed with Dr. Bartholome Bill, and all decision making was made in collaboration.    Avie Arenas  PA-C 12/29/2018 8:27 AM

## 2018-12-29 NOTE — Plan of Care (Signed)
  Problem: Education: Goal: Knowledge of General Education information will improve Description: Including pain rating scale, medication(s)/side effects and non-pharmacologic comfort measures Outcome: Adequate for Discharge   Problem: Health Behavior/Discharge Planning: Goal: Ability to manage health-related needs will improve Outcome: Adequate for Discharge   Problem: Clinical Measurements: Goal: Ability to maintain clinical measurements within normal limits will improve Outcome: Adequate for Discharge Goal: Diagnostic test results will improve Outcome: Adequate for Discharge Goal: Cardiovascular complication will be avoided Outcome: Adequate for Discharge   Problem: Pain Managment: Goal: General experience of comfort will improve Outcome: Adequate for Discharge   Problem: Safety: Goal: Ability to remain free from injury will improve Outcome: Adequate for Discharge

## 2018-12-29 NOTE — Progress Notes (Signed)
Patient discharged home with husband, follow up appointments and instructions provided and medication regimen and information reviewed.

## 2018-12-31 NOTE — Discharge Summary (Signed)
[facility] [place] NAME: Teresa Moyer    MR#:  [id number]  DATE OF BIRTH:  [birth year]  DATE OF ADMISSION:  12/27/2018   ADMITTING PHYSICIAN: Max Sane, MD  DATE OF DISCHARGE: 12/29/2018  1:37 PM  PRIMARY CARE PHYSICIAN: Jerrol Banana., MD   ADMISSION DIAGNOSIS:  ST elevation myocardial infarction (STEMI), unspecified artery ([place]) [I21.3] STEMI involving left anterior descending coronary artery ([facility]) [I21.02] DISCHARGE DIAGNOSIS:  Principal Problem:   STEMI (ST elevation myocardial infarction) ([place]) Active Problems:   STEMI involving left anterior descending coronary artery ([place])  SECONDARY DIAGNOSIS:   Past Medical History:  Diagnosis Date  . Cataracts, bilateral   . Environmental allergies   . GERD (gastroesophageal reflux disease)   . Heart murmur 2019   had since a child. dr. Nehemiah Massed follows d/t getting louder  . Hemorrhoids   . History of chickenpox   . Hypertension   . Incontinence in female   . Osteoarthritis   . Skin cancer 12/2017   BCC. nose removed from nose last week.  shoulder squamos cell removed previously  . Type O blood, Rh negative 12/2017   patient requests that this be present on her chart   HOSPITAL COURSE:  83 year old female with history of hypertension, hyperlipidemia and mild aortic stenosis admitted with severe substernal chest pain without radiation at rest associated with shortness of breath.  She had anterior ST elevation felt to be consistent with an ST elevated myocardial infarction.    * Acute anterior STEMI- s/p PCI with DES to the mid LAD. Her high-sensitivity troponin is peaked to greater than 27,000.  She is hemodynamically stable -Continue aspirin and brilinta for at least 12 months -Continue Lipitor 20 mg daily  * Acute hypoxic respiratory failuresecondary toacute systolic CHF due to ischemic cardiomyopathy-noted to have reduced EF during catheterization -ECHO showed EF  45-50% -Continue Coreg, Lasix and irbesartan at D/C  Hypokalemia -Repleted & Resolved  Hypertension-controlled DISCHARGE CONDITIONS:  stable CONSULTS OBTAINED:  Treatment Team:  Teodoro Spray, MD DRUG ALLERGIES:   Allergies  Allergen Reactions  . Accupril [Quinapril Hcl] Hives and Swelling    SWELLING OF NOSE/FACE  . Sulfa Antibiotics Shortness Of Breath and Swelling  . Clinoril [Sulindac] Other (See Comments)    Unsure of reaction type  . Hydrochlorothiazide Other (See Comments)    Unsure of reaction type  . Tessalon [Benzonatate] Other (See Comments)    Unsure of reaction type  . Amoxicillin Rash    Has patient had a PCN reaction causing immediate rash, facial/tongue/throat swelling, SOB or lightheadedness with hypotension: Unknown Has patient had a PCN reaction causing severe rash involving mucus membranes or skin necrosis: Unknown Has patient had a PCN reaction that required hospitalization: No Has patient had a PCN reaction occurring within the last 10 years:Possibly unsure  If all of the above answers are "NO", then may proceed with Cephalosporin use.   . Codeine Sulfate Nausea And Vomiting  . Penicillins Rash    Has patient had a PCN reaction causing immediate rash, facial/tongue/throat swelling, SOB or lightheadedness with hypotension: Unknown Has patient had a PCN reaction causing severe rash involving mucus membranes or skin necrosis: Unknown Has patient had a PCN reaction that required hospitalization: No Has patient had a PCN reaction occurring within the last 10 years:Possibly unsure  If all of the above answers are "NO", then may proceed with Cephalosporin use.   DISCHARGE MEDICATIONS:   Allergies as of  12/29/2018      Reactions   Accupril [quinapril Hcl] Hives, Swelling   SWELLING OF NOSE/FACE   Sulfa Antibiotics Shortness Of Breath, Swelling   Clinoril [sulindac] Other (See Comments)   Unsure of reaction type   Hydrochlorothiazide Other (See  Comments)   Unsure of reaction type   Tessalon [benzonatate] Other (See Comments)   Unsure of reaction type   Amoxicillin Rash   Has patient had a PCN reaction causing immediate rash, facial/tongue/throat swelling, SOB or lightheadedness with hypotension: Unknown Has patient had a PCN reaction causing severe rash involving mucus membranes or skin necrosis: Unknown Has patient had a PCN reaction that required hospitalization: No Has patient had a PCN reaction occurring within the last 10 years:Possibly unsure  If all of the above answers are "NO", then may proceed with Cephalosporin use.   Codeine Sulfate Nausea And Vomiting   Penicillins Rash   Has patient had a PCN reaction causing immediate rash, facial/tongue/throat swelling, SOB or lightheadedness with hypotension: Unknown Has patient had a PCN reaction causing severe rash involving mucus membranes or skin necrosis: Unknown Has patient had a PCN reaction that required hospitalization: No Has patient had a PCN reaction occurring within the last 10 years:Possibly unsure  If all of the above answers are "NO", then may proceed with Cephalosporin use.      Medication List    STOP taking these medications   amLODipine 5 MG tablet Commonly known as: NORVASC   fenofibrate 160 MG tablet   meloxicam 7.5 MG tablet Commonly known as: MOBIC   ranitidine 150 MG tablet Commonly known as: ZANTAC   telmisartan 80 MG tablet Commonly known as: MICARDIS Replaced by: irbesartan 300 MG tablet   traMADol 50 MG tablet Commonly known as: ULTRAM   VESIcare 5 MG tablet Generic drug: solifenacin     TAKE these medications   acetaminophen 650 MG CR tablet Commonly known as: TYLENOL Take 650 mg by mouth every 8 (eight) hours as needed for pain.   Align 4 MG Caps Take 4 mg by mouth daily after lunch.   AQUAPHOR EX Apply 1 application topically daily. Clean wound with peroxide and apply ointment to affected area of nose.   aspirin 81 MG  chewable tablet Chew 1 tablet (81 mg total) by mouth daily. Notes to patient: Take tomorrow morning   atorvastatin 40 MG tablet Commonly known as: LIPITOR Take 1 tablet (40 mg total) by mouth daily at 6 PM. Notes to patient: Take tonight at 6 pm   azelastine 0.1 % nasal spray Commonly known as: ASTELIN Place 1 spray into both nostrils at bedtime.   carvedilol 6.25 MG tablet Commonly known as: COREG Take 1 tablet (6.25 mg total) by mouth 2 (two) times daily with a meal. What changed:   medication strength  how much to take Notes to patient: Take tonight at 6 PM   fluticasone 50 MCG/ACT nasal spray Commonly known as: FLONASE Place 1 spray into both nostrils daily.   furosemide 20 MG tablet Commonly known as: LASIX TAKE 1 TABLET BY MOUTH ONCE DAILY. Notes to patient: Take tomorrow morning   GenTeal 0.25-0.3 % Gel Generic drug: Carboxymethylcell-Hypromellose Apply 1 application to eye at bedtime.   irbesartan 300 MG tablet Commonly known as: AVAPRO Take 0.5 tablets (150 mg total) by mouth daily. Replaces: telmisartan 80 MG tablet Notes to patient: Take tomorrow   JOINT FORMULA PO Take 1 capsule by mouth daily before breakfast. Arthrozene Notes to patient: Take tomorrow  omeprazole 20 MG capsule Commonly known as: PRILOSEC Take 1 capsule (20 mg total) by mouth daily. Notes to patient: Take tomorrow   PRESERVISION AREDS PO Take 1 tablet by mouth 2 (two) times daily.   Systane 0.4-0.3 % Soln Generic drug: Polyethyl Glycol-Propyl Glycol Place 1 drop into both eyes 3 (three) times daily as needed (for dry eyes).   ticagrelor 90 MG Tabs tablet Commonly known as: BRILINTA Take 1 tablet (90 mg total) by mouth 2 (two) times daily. Notes to patient: Take tonight at bedtime   ZyrTEC Allergy 10 MG tablet Generic drug: cetirizine Take 10 mg by mouth at bedtime.        DISCHARGE INSTRUCTIONS:   DIET:  Cardiac diet DISCHARGE CONDITION:  Stable ACTIVITY:   Activity as tolerated OXYGEN:  Home Oxygen: No.  Oxygen Delivery: room air DISCHARGE LOCATION:  home   If you experience worsening of your admission symptoms, develop shortness of breath, life threatening emergency, suicidal or homicidal thoughts you must seek medical attention immediately by calling 911 or calling your MD immediately  if symptoms less severe.  You Must read complete instructions/literature along with all the possible adverse reactions/side effects for all the Medicines you take and that have been prescribed to you. Take any new Medicines after you have completely understood and accpet all the possible adverse reactions/side effects.   Please note  You were cared for by a hospitalist during your hospital stay. If you have any questions about your discharge medications or the care you received while you were in the hospital after you are discharged, you can call the unit and asked to speak with the hospitalist on call if the hospitalist that took care of you is not available. Once you are discharged, your primary care physician will handle any further medical issues. Please note that NO REFILLS for any discharge medications will be authorized once you are discharged, as it is imperative that you return to your primary care physician (or establish a relationship with a primary care physician if you do not have one) for your aftercare needs so that they can reassess your need for medications and monitor your lab values.    On the day of Discharge:  VITAL SIGNS:  Blood pressure 139/76, pulse 85, temperature 98 F (36.7 C), temperature source Oral, resp. rate 18, height 5\' 2"  (1.575 m), weight 64.5 kg, SpO2 96 %. PHYSICAL EXAMINATION:  GENERAL:  83 y.o.-year-old patient lying in the bed with no acute distress.  EYES: Pupils equal, round, reactive to light and accommodation. No scleral icterus. Extraocular muscles intact.  HEENT: Head atraumatic, normocephalic. Oropharynx and  nasopharynx clear.  NECK:  Supple, no jugular venous distention. No thyroid enlargement, no tenderness.  LUNGS: Normal breath sounds bilaterally, no wheezing, rales,rhonchi or crepitation. No use of accessory muscles of respiration.  CARDIOVASCULAR: S1, S2 normal. No murmurs, rubs, or gallops.  ABDOMEN: Soft, non-tender, non-distended. Bowel sounds present. No organomegaly or mass.  EXTREMITIES: No pedal edema, cyanosis, or clubbing.  NEUROLOGIC: Cranial nerves II through XII are intact. Muscle strength 5/5 in all extremities. Sensation intact. Gait not checked.  PSYCHIATRIC: The patient is alert and oriented x 3.  SKIN: No obvious rash, lesion, or ulcer.  DATA REVIEW:   CBC Recent Labs  Lab 12/29/18 0652  WBC 10.2  HGB 13.0  HCT 39.4  PLT 289    Chemistries  Recent Labs  Lab 12/29/18 0652  NA 135  K 3.9  CL 103  CO2 22  GLUCOSE 112*  BUN 21  CREATININE 0.86  CALCIUM 9.1     Follow-up Information    [facility]monary [facility] On 01/02/2019.   Specialty: Cardiac Rehabilitation Why: Monday, 01/02/2019 @ 3:30 p.m. This will be Virtual - via telephone -  for Orientation.  Approximately, 30 minutes.   Second appt:  Thur  01/05/2019 Time:  1:00 p.m. Come to [facility] for appt with Exercise Physiologist/6 min walk/Dietitian. Contact information: [place] [id number] [address] [place]       Jerrol Banana., MD. Daphane Shepherd on 01/03/2019.   Specialty: Family Medicine Why: appointment at 9:20am Contact information: [address] [place] [address] [phone]        Corey Skains, MD. Go on 01/11/2019.   Specialty: Cardiology Why: appointment at 2:15pm Contact information: [address] [place] [facility] [place] [address] [phone]           Management plans discussed with the patient, family and they are in agreement.  CODE STATUS: Prior   TOTAL  TIME TAKING CARE OF THIS PATIENT: 45 minutes.    Max Sane M.D on 12/31/2018 at 11:20 AM  Between 7am to 6pm - Pager - [phone]  After 6pm go to [email or web address] - password [email or web address] [facility]  [employer] [place]  Office  [phone]  CC: Primary care physician; Jerrol Banana., MD   Note: This dictation was prepared with Dragon dictation along with smaller phrase technology. Any transcriptional errors that result from this process are unintentional.

## 2019-01-02 ENCOUNTER — Encounter: Payer: Medicare Other | Attending: Internal Medicine | Admitting: *Deleted

## 2019-01-02 DIAGNOSIS — I213 ST elevation (STEMI) myocardial infarction of unspecified site: Secondary | ICD-10-CM | POA: Insufficient documentation

## 2019-01-02 NOTE — Progress Notes (Signed)
Virtual orientation completed by RN. EP/RD orientation scheduled for 7/23 at pm

## 2019-01-03 ENCOUNTER — Other Ambulatory Visit: Payer: Self-pay | Admitting: *Deleted

## 2019-01-03 ENCOUNTER — Encounter: Payer: Self-pay | Admitting: Family Medicine

## 2019-01-03 ENCOUNTER — Other Ambulatory Visit: Payer: Self-pay

## 2019-01-03 ENCOUNTER — Ambulatory Visit (INDEPENDENT_AMBULATORY_CARE_PROVIDER_SITE_OTHER): Payer: Medicare Other | Admitting: Family Medicine

## 2019-01-03 VITALS — BP 128/74 | HR 76 | Temp 98.2°F | Resp 16 | Ht 62.0 in | Wt 146.0 lb

## 2019-01-03 DIAGNOSIS — E785 Hyperlipidemia, unspecified: Secondary | ICD-10-CM

## 2019-01-03 DIAGNOSIS — I2102 ST elevation (STEMI) myocardial infarction involving left anterior descending coronary artery: Secondary | ICD-10-CM | POA: Diagnosis not present

## 2019-01-03 DIAGNOSIS — I10 Essential (primary) hypertension: Secondary | ICD-10-CM | POA: Diagnosis not present

## 2019-01-03 DIAGNOSIS — H35341 Macular cyst, hole, or pseudohole, right eye: Secondary | ICD-10-CM | POA: Diagnosis not present

## 2019-01-03 NOTE — Progress Notes (Signed)
Patient: Teresa Moyer Female    DOB: 1936/03/28   83 y.o.   MRN: [id number] Visit Date: 01/03/2019  Today's Provider: [name], MD   Chief Complaint  Patient presents with  . Hospitalization Follow-up   Subjective:   HPI  Follow up Hospitalization  Patient was admitted to [facility] on 12/27/2018 and discharged on 12/29/2018. She was treated for STEMI. Treatment for this included started on atorvastatin, aspirin, and carvedilol.  She reports good compliance with treatment. She reports this condition is Improved.    Allergies  Allergen Reactions  . Accupril [Quinapril Hcl] Hives and Swelling    SWELLING OF NOSE/FACE  . Sulfa Antibiotics Shortness Of Breath and Swelling  . Clinoril [Sulindac] Other (See Comments)    Unsure of reaction type  . Hydrochlorothiazide Other (See Comments)    Unsure of reaction type  . Tessalon [Benzonatate] Other (See Comments)    Unsure of reaction type  . Amoxicillin Rash    Has patient had a PCN reaction causing immediate rash, facial/tongue/throat swelling, SOB or lightheadedness with hypotension: Unknown Has patient had a PCN reaction causing severe rash involving mucus membranes or skin necrosis: Unknown Has patient had a PCN reaction that required hospitalization: No Has patient had a PCN reaction occurring within the last 10 years:Possibly unsure  If all of the above answers are "NO", then may proceed with Cephalosporin use.   . Codeine Sulfate Nausea And Vomiting  . Penicillins Rash    Has patient had a PCN reaction causing immediate rash, facial/tongue/throat swelling, SOB or lightheadedness with hypotension: Unknown Has patient had a PCN reaction causing severe rash involving mucus membranes or skin necrosis: Unknown Has patient had a PCN reaction that required hospitalization: No Has patient had a PCN reaction occurring within the last 10 years:Possibly unsure  If all of the above answers are "NO", then may proceed  with Cephalosporin use.     Current Outpatient Medications:  .  acetaminophen (TYLENOL) 650 MG CR tablet, Take 650 mg by mouth every 8 (eight) hours as needed for pain., Disp: , Rfl:  .  aspirin 81 MG chewable tablet, Chew 1 tablet (81 mg total) by mouth daily., Disp: 30 tablet, Rfl: 0 .  atorvastatin (LIPITOR) 40 MG tablet, Take 1 tablet (40 mg total) by mouth daily at 6 PM., Disp: 30 tablet, Rfl: 0 .  azelastine (ASTELIN) 0.1 % nasal spray, Place 1 spray into both nostrils at bedtime. , Disp: , Rfl:  .  Carboxymethylcell-Hypromellose (GENTEAL) 0.25-0.3 % GEL, Apply 1 application to eye at bedtime., Disp: , Rfl:  .  carvedilol (COREG) 6.25 MG tablet, Take 1 tablet (6.25 mg total) by mouth 2 (two) times daily with a meal., Disp: 60 tablet, Rfl: 0 .  cetirizine (ZYRTEC ALLERGY) 10 MG tablet, Take 10 mg by mouth at bedtime. , Disp: , Rfl:  .  fluticasone (FLONASE) 50 MCG/ACT nasal spray, Place 1 spray into both nostrils daily. , Disp: , Rfl:  .  furosemide (LASIX) 20 MG tablet, TAKE 1 TABLET BY MOUTH ONCE DAILY., Disp: 30 tablet, Rfl: 11 .  irbesartan (AVAPRO) 300 MG tablet, Take 0.5 tablets (150 mg total) by mouth daily., Disp: 30 tablet, Rfl: 0 .  Multiple Vitamins-Minerals (PRESERVISION AREDS PO), Take 1 tablet by mouth 2 (two) times daily. , Disp: , Rfl:  .  Nutritional Supplements (JOINT FORMULA PO), Take 1 capsule by mouth daily before breakfast. Arthrozene, Disp: , Rfl:  .  omeprazole (PRILOSEC)  20 MG capsule, Take 1 capsule (20 mg total) by mouth daily., Disp: 90 capsule, Rfl: 3 .  Polyethyl Glycol-Propyl Glycol (SYSTANE) 0.4-0.3 % SOLN, Place 1 drop into both eyes 3 (three) times daily as needed (for dry eyes)., Disp: , Rfl:  .  Probiotic Product (ALIGN) 4 MG CAPS, Take 4 mg by mouth daily after lunch. , Disp: , Rfl:  .  ticagrelor (BRILINTA) 90 MG TABS tablet, Take 1 tablet (90 mg total) by mouth 2 (two) times daily., Disp: 60 tablet, Rfl: 0 .  Emollient (AQUAPHOR EX), Apply 1  application topically daily. Clean wound with peroxide and apply ointment to affected area of nose., Disp: , Rfl:   Review of Systems  Constitutional: Negative.   HENT: Negative.   Eyes: Negative.   Respiratory: Negative.   Cardiovascular: Negative.   Gastrointestinal: Negative.   Endocrine: Negative.   Musculoskeletal: Negative.   Allergic/Immunologic: Negative.   Neurological: Negative.   Psychiatric/Behavioral: Negative.     Social History   Tobacco Use  . Smoking status: Never Smoker  . Smokeless tobacco: Never Used  Substance Use Topics  . Alcohol use: No      Objective:   BP 128/74 (BP Location: Left Arm, Patient Position: Sitting, Cuff Size: Normal)   Pulse 76   Temp 98.2 F (36.8 C)   Resp 16   Ht 5\' 2"  (1.575 m)   Wt 146 lb (66.2 kg)   SpO2 97%   BMI 26.70 kg/m  Vitals:   01/03/19 0935  BP: 128/74  Pulse: 76  Resp: 16  Temp: 98.2 F (36.8 C)  SpO2: 97%  Weight: 146 lb (66.2 kg)  Height: 5\' 2"  (1.575 m)     Physical Exam Vitals signs reviewed.  Constitutional:      Appearance: She is well-developed.  HENT:     Head: Normocephalic and atraumatic.     Right Ear: External ear normal.     Left Ear: External ear normal.     Nose: Nose normal.  Eyes:     Conjunctiva/sclera: Conjunctivae normal.     Pupils: Pupils are equal, round, and reactive to light.  Neck:     Musculoskeletal: Normal range of motion and neck supple.  Cardiovascular:     Rate and Rhythm: Normal rate and regular rhythm.     Heart sounds: Murmur (2/6 systolic murmur) present.  Pulmonary:     Effort: Pulmonary effort is normal.     Breath sounds: Normal breath sounds.  Abdominal:     General: There is no distension.     Palpations: Abdomen is soft.     Tenderness: There is no abdominal tenderness.  Musculoskeletal: Normal range of motion.  Skin:    General: Skin is warm and dry.  Neurological:     Mental Status: She is alert and oriented to person, place, and time.      Deep Tendon Reflexes: Reflexes are normal and symmetric.  Psychiatric:        Behavior: Behavior normal.        Thought Content: Thought content normal.        Judgment: Judgment normal.      No results found for any visits on 01/03/19.     Assessment & Plan    1. STEMI involving left anterior descending coronary artery ([place]) All risk factors treated. More than 50% 25 minute visit spent in counseling or coordination of care  2. Hyperlipidemia, unspecified hyperlipidemia type Now on lipitor 40mg . Check labs next week  on this - Lipid panel - Comprehensive metabolic panel  3. Essential (primary) hypertension Treated.  4. Macular degeneration due to cyst or hole of right eye      [name], MD  [place]

## 2019-01-03 NOTE — Patient Outreach (Signed)
[facility]) Care Management  01/03/2019  Teresa Moyer 1936-03-16 [id number]   Subjective: Telephone call to patient's home number, spoke with patient, and HIPAA verified.  Discussed [facility] Care Management Medicare EMMI General Discharge Red Flag Alert follow up, patient voiced understanding, and is in agreement to follow up.  Patient states she is doing pretty good, remembers receiving EMMI automated call, states she has no open wounds, EMMI may have captured the wrong answer, has a bruise on her arm that was evaluated by primary MD during this morning's hospital follow up visit,  appointment went well, and blood pressure was great.   States she was evaluated by cardiac rehab on 01/02/2019 and will go for initial visit on 01/05/2019.  States she also has a follow up visit with cardiologist on 01/11/2019.   States she also has newly diagnosed macular degeneration and will have 2nd eye injection on 01/06/2019.  Patient states she is able to manage self care and has assistance as needed.  Patient voices understanding of medical diagnosis and treatment plan.  Patient states she does not have any education material, EMMI follow up, care coordination, care management, disease monitoring, transportation, community resource, or pharmacy needs at this time.  States she is very appreciative of the follow up and is in agreement to receive [place] Management EMMI follow up calls as needed.    Objective: Per KPN (Knowledge Performance Now, point of care tool) and chart review, patient hospitalized 12/27/2018 -12/29/2018 for ST elevation myocardial infarction (STEMI).   Patient also has a history of hypertension, Osteoarthritis, skin cancer, hyperlipidemia , and Heart murmur.       Assessment: Received Medicare EMMI General Discharge Red Flag Alert follow up referral on 01/02/2019.   Red Flag Alert Trigger, Day #1, patient answered no to the following question: Wounds healing well?   EMMI follow up  completed and no further care management needs.     Plan: RNCM will complete case closure due to follow up completed / no care management needs.      Payam Gribble H. Annia Friendly, BSN, [place] [facility] Telephonic CM Phone: [phone] Fax: [phone]

## 2019-01-05 ENCOUNTER — Other Ambulatory Visit: Payer: Self-pay

## 2019-01-05 VITALS — Ht 62.0 in | Wt 146.0 lb

## 2019-01-05 DIAGNOSIS — I213 ST elevation (STEMI) myocardial infarction of unspecified site: Secondary | ICD-10-CM | POA: Diagnosis not present

## 2019-01-05 NOTE — Patient Instructions (Signed)
Patient Instructions  Patient Details  Name: Teresa Moyer MRN: [id number] Date of Birth: August 28, 1935 Referring Provider:  Corey Skains, MD  Below are your personal goals for exercise, nutrition, and risk factors. Our goal is to help you stay on track towards obtaining and maintaining these goals. We will be discussing your progress on these goals with you throughout the program.  Initial Exercise Prescription: Initial Exercise Prescription - 01/05/19 1400      Date of Initial Exercise RX and Referring Provider   Date  01/05/19    Referring Provider  Nehemiah Massed      Treadmill   MPH  1    Grade  0    Minutes  15    METs  0.5      Recumbant Bike   Level  1    RPM  50    Watts  25    Minutes  15    METs  1      NuStep   Level  1    SPM  50    Minutes  15    METs  1      Arm Ergometer   Level  1    Watts  10    RPM  10    Minutes  15    METs  1      Biostep-RELP   Level  1    SPM  30    Minutes  15    METs  1      Prescription Details   Frequency (times per week)  3-5    Duration  Progress to 30 minutes of continuous aerobic without signs/symptoms of physical distress      Intensity   THRR 40-80% of Max Heartrate  103-127    Ratings of Perceived Exertion  11-13    Perceived Dyspnea  0-4      Progression   Progression  Continue progressive overload as per policy without signs/symptoms or physical distress.      Resistance Training   Training Prescription  Yes    Weight  2    Reps  10-15       Exercise Goals: Frequency: Be able to perform aerobic exercise two to three times per week in program working toward 2-5 days per week of home exercise.  Intensity: Work with a perceived exertion of 11 (fairly light) - 15 (hard) while following your exercise prescription.  We will make changes to your prescription with you as you progress through the program.   Duration: Be able to do 30 to 45 minutes of continuous aerobic exercise in addition to a 5 minute  warm-up and a 5 minute cool-down routine.   Nutrition Goals: Your personal nutrition goals will be established when you do your nutrition analysis with the dietician.  The following are general nutrition guidelines to follow: Cholesterol < 200mg /day Sodium < 1500mg /day Fiber: Women over 50 yrs - 21 grams per day  Personal Goals: Personal Goals and Risk Factors at Admission - 01/02/19 1549      Core Components/Risk Factors/Patient Goals on Admission   Hypertension  Yes    Intervention  Provide education on lifestyle modifcations including regular physical activity/exercise, weight management, moderate sodium restriction and increased consumption of fresh fruit, vegetables, and low fat dairy, alcohol moderation, and smoking cessation.;Monitor prescription use compliance.    Expected Outcomes  Short Term: Continued assessment and intervention until BP is < 140/34mm HG in hypertensive participants. < 130/70mm HG in  hypertensive participants with diabetes, heart failure or chronic kidney disease.;Long Term: Maintenance of blood pressure at goal levels.    Lipids  Yes    Intervention  Provide education and support for participant on nutrition & aerobic/resistive exercise along with prescribed medications to achieve LDL 70mg , HDL >40mg .    Expected Outcomes  Short Term: Participant states understanding of desired cholesterol values and is compliant with medications prescribed. Participant is following exercise prescription and nutrition guidelines.;Long Term: Cholesterol controlled with medications as prescribed, with individualized exercise RX and with personalized nutrition plan. Value goals: LDL < 70mg , HDL > 40 mg.       Tobacco Use Initial Evaluation: Social History   Tobacco Use  Smoking Status Never Smoker  Smokeless Tobacco Never Used    Exercise Goals and Review: Exercise Goals    Row Name 01/05/19 1441             Exercise Goals   Increase Physical Activity  Yes        Intervention  Provide advice, education, support and counseling about physical activity/exercise needs.;Develop an individualized exercise prescription for aerobic and resistive training based on initial evaluation findings, risk stratification, comorbidities and participant's personal goals.       Expected Outcomes  Short Term: Attend rehab on a regular basis to increase amount of physical activity.;Long Term: Add in home exercise to make exercise part of routine and to increase amount of physical activity.;Long Term: Exercising regularly at least 3-5 days a week.       Increase Strength and Stamina  Yes       Intervention  Provide advice, education, support and counseling about physical activity/exercise needs.;Develop an individualized exercise prescription for aerobic and resistive training based on initial evaluation findings, risk stratification, comorbidities and participant's personal goals.       Expected Outcomes  Short Term: Increase workloads from initial exercise prescription for resistance, speed, and METs.;Short Term: Perform resistance training exercises routinely during rehab and add in resistance training at home;Long Term: Improve cardiorespiratory fitness, muscular endurance and strength as measured by increased METs and functional capacity (6MWT)       Able to understand and use rate of perceived exertion (RPE) scale  Yes       Intervention  Provide education and explanation on how to use RPE scale       Expected Outcomes  Short Term: Able to use RPE daily in rehab to express subjective intensity level;Long Term:  Able to use RPE to guide intensity level when exercising independently       Intervention  Provide education and explanation on how to use Dyspnea scale       Expected Outcomes  Short Term: Able to use Dyspnea scale daily in rehab to express subjective sense of shortness of breath during exertion;Long Term: Able to use Dyspnea scale to guide intensity level when exercising  independently       Intervention  Provide education, explanation, and written materials on patient's individual exercise prescription          Copy of goals given to participant.

## 2019-01-05 NOTE — Progress Notes (Signed)
Cardiac Individual Treatment Plan  Patient Details  Name: Teresa Moyer MRN: [id number] Date of Birth: 1935-08-22 Referring Provider:     Cardiac Rehab from 01/05/2019 in [facility] Cardiac and Pulmonary Rehab  Referring Provider  Nehemiah Massed      Initial Encounter Date:    Cardiac Rehab from 01/05/2019 in [facility] Cardiac and Pulmonary Rehab  Date  01/05/19      Visit Diagnosis: ST elevation myocardial infarction (STEMI), unspecified artery ([place])  Patient's Home Medications on Admission:  Current Outpatient Medications:  .  acetaminophen (TYLENOL) 650 MG CR tablet, Take 650 mg by mouth every 8 (eight) hours as needed for pain., Disp: , Rfl:  .  aspirin 81 MG chewable tablet, Chew 1 tablet (81 mg total) by mouth daily., Disp: 30 tablet, Rfl: 0 .  atorvastatin (LIPITOR) 40 MG tablet, Take 1 tablet (40 mg total) by mouth daily at 6 PM., Disp: 30 tablet, Rfl: 0 .  azelastine (ASTELIN) 0.1 % nasal spray, Place 1 spray into both nostrils at bedtime. , Disp: , Rfl:  .  Carboxymethylcell-Hypromellose (GENTEAL) 0.25-0.3 % GEL, Apply 1 application to eye at bedtime., Disp: , Rfl:  .  carvedilol (COREG) 6.25 MG tablet, Take 1 tablet (6.25 mg total) by mouth 2 (two) times daily with a meal., Disp: 60 tablet, Rfl: 0 .  cetirizine (ZYRTEC ALLERGY) 10 MG tablet, Take 10 mg by mouth at bedtime. , Disp: , Rfl:  .  Emollient (AQUAPHOR EX), Apply 1 application topically daily. Clean wound with peroxide and apply ointment to affected area of nose., Disp: , Rfl:  .  fluticasone (FLONASE) 50 MCG/ACT nasal spray, Place 1 spray into both nostrils daily. , Disp: , Rfl:  .  furosemide (LASIX) 20 MG tablet, TAKE 1 TABLET BY MOUTH ONCE DAILY., Disp: 30 tablet, Rfl: 11 .  irbesartan (AVAPRO) 300 MG tablet, Take 0.5 tablets (150 mg total) by mouth daily., Disp: 30 tablet, Rfl: 0 .  Multiple Vitamins-Minerals (PRESERVISION AREDS PO), Take 1 tablet by mouth 2 (two) times daily. , Disp: , Rfl:  .  Nutritional Supplements (JOINT  FORMULA PO), Take 1 capsule by mouth daily before breakfast. Arthrozene, Disp: , Rfl:  .  omeprazole (PRILOSEC) 20 MG capsule, Take 1 capsule (20 mg total) by mouth daily., Disp: 90 capsule, Rfl: 3 .  Polyethyl Glycol-Propyl Glycol (SYSTANE) 0.4-0.3 % SOLN, Place 1 drop into both eyes 3 (three) times daily as needed (for dry eyes)., Disp: , Rfl:  .  Probiotic Product (ALIGN) 4 MG CAPS, Take 4 mg by mouth daily after lunch. , Disp: , Rfl:  .  ticagrelor (BRILINTA) 90 MG TABS tablet, Take 1 tablet (90 mg total) by mouth 2 (two) times daily., Disp: 60 tablet, Rfl: 0  Past Medical History: Past Medical History:  Diagnosis Date  . Cataracts, bilateral   . Environmental allergies   . GERD (gastroesophageal reflux disease)   . Heart murmur 2019   had since a child. dr. Nehemiah Massed follows d/t getting louder  . Hemorrhoids   . History of chickenpox   . Hypertension   . Incontinence in female   . Osteoarthritis   . Skin cancer 12/2017   BCC. nose removed from nose last week.  shoulder squamos cell removed previously  . Type O blood, Rh negative 12/2017   patient requests that this be present on her chart    Tobacco Use: Social History   Tobacco Use  Smoking Status Never Smoker  Smokeless Tobacco Never Used  Labs: Recent Review Flowsheet Data    Labs for ITP Cardiac and Pulmonary Rehab Latest Ref Rng & Units 08/16/2015 09/01/2016 07/01/2017 11/18/2018 12/28/2018   Cholestrol 100 - 199 mg/dL 161 171 167 163 -   LDLCALC 0 - 99 mg/dL 58 66 61 61 -   HDL >39 mg/dL 71 74 66 77 -   Trlycerides 0 - 149 mg/dL 161(H) 157(H) 199(H) 127 -   Hemoglobin A1c 4.8 - 5.6 % - - - - 5.8(H)       Exercise Target Goals: Exercise Program Goal: Individual exercise prescription set using results from initial 6 min walk test and THRR while considering  patient's activity barriers and safety.   Exercise Prescription Goal: Initial exercise prescription builds to 30-45 minutes a day of aerobic activity, 2-3 days  per week.  Home exercise guidelines will be given to patient during program as part of exercise prescription that the participant will acknowledge.  Activity Barriers & Risk Stratification: Activity Barriers & Cardiac Risk Stratification - 01/02/19 1553      Activity Barriers & Cardiac Risk Stratification   Activity Barriers  Arthritis    Cardiac Risk Stratification  Moderate       6 Minute Walk: 6 Minute Walk    Row Name 01/05/19 1432         6 Minute Walk   Phase  Initial     Distance  263 feet     Walk Time  2 minutes     MPH  1.49     METS  0.31     RPE  13     Perceived Dyspnea   3     VO2 Peak  1.1     Symptoms  Yes (comment)     Comments  Patient stopped due to shortness of breath which she reports is increased when wearing a mask. SpO2 90% increased to 96%. Patient did not wish to resume test until after time had expired.     Resting HR  79 bpm     Resting BP  132/80     Exercise Oxygen Saturation  during 6 min walk  90 %     Max Ex. HR  84 bpm     Max Ex. BP  146/80     2 Minute Post BP  122/68        Oxygen Initial Assessment:   Oxygen Re-Evaluation:   Oxygen Discharge (Final Oxygen Re-Evaluation):   Initial Exercise Prescription: Initial Exercise Prescription - 01/05/19 1400      Date of Initial Exercise RX and Referring Provider   Date  01/05/19    Referring Provider  Nehemiah Massed      Treadmill   MPH  1    Grade  0    Minutes  15    METs  0.5      Recumbant Bike   Level  1    RPM  50    Watts  25    Minutes  15    METs  1      NuStep   Level  1    SPM  50    Minutes  15    METs  1      Arm Ergometer   Level  1    Watts  10    RPM  10    Minutes  15    METs  1      Biostep-RELP   Level  1  SPM  30    Minutes  15    METs  1      Prescription Details   Frequency (times per week)  3-5    Duration  Progress to 30 minutes of continuous aerobic without signs/symptoms of physical distress      Intensity   THRR 40-80% of Max  Heartrate  103-127    Ratings of Perceived Exertion  11-13    Perceived Dyspnea  0-4      Progression   Progression  Continue progressive overload as per policy without signs/symptoms or physical distress.      Resistance Training   Training Prescription  Yes    Weight  2    Reps  10-15       Perform Capillary Blood Glucose checks as needed.  Exercise Prescription Changes:   Exercise Comments:   Exercise Goals and Review: Exercise Goals    Row Name 01/05/19 1441             Exercise Goals   Increase Physical Activity  Yes       Intervention  Provide advice, education, support and counseling about physical activity/exercise needs.;Develop an individualized exercise prescription for aerobic and resistive training based on initial evaluation findings, risk stratification, comorbidities and participant's personal goals.       Expected Outcomes  Short Term: Attend rehab on a regular basis to increase amount of physical activity.;Long Term: Add in home exercise to make exercise part of routine and to increase amount of physical activity.;Long Term: Exercising regularly at least 3-5 days a week.       Increase Strength and Stamina  Yes       Intervention  Provide advice, education, support and counseling about physical activity/exercise needs.;Develop an individualized exercise prescription for aerobic and resistive training based on initial evaluation findings, risk stratification, comorbidities and participant's personal goals.       Expected Outcomes  Short Term: Increase workloads from initial exercise prescription for resistance, speed, and METs.;Short Term: Perform resistance training exercises routinely during rehab and add in resistance training at home;Long Term: Improve cardiorespiratory fitness, muscular endurance and strength as measured by increased METs and functional capacity (6MWT)       Able to understand and use rate of perceived exertion (RPE) scale  Yes        Intervention  Provide education and explanation on how to use RPE scale       Expected Outcomes  Short Term: Able to use RPE daily in rehab to express subjective intensity level;Long Term:  Able to use RPE to guide intensity level when exercising independently       Intervention  Provide education and explanation on how to use Dyspnea scale       Expected Outcomes  Short Term: Able to use Dyspnea scale daily in rehab to express subjective sense of shortness of breath during exertion;Long Term: Able to use Dyspnea scale to guide intensity level when exercising independently       Intervention  Provide education, explanation, and written materials on patient's individual exercise prescription          Exercise Goals Re-Evaluation :   Discharge Exercise Prescription (Final Exercise Prescription Changes):   Nutrition:  Target Goals: Understanding of nutrition guidelines, daily intake of sodium <1539m, cholesterol <2024m calories 30% from fat and 7% or less from saturated fats, daily to have 5 or more servings of fruits and vegetables.  Biometrics: Pre Biometrics - 01/05/19 1436  Pre Biometrics   Height  '5\' 2"'  (1.575 m)    Weight  146 lb (66.2 kg)    BMI (Calculated)  26.7    Single Leg Stand  2.65 seconds        Nutrition Therapy Plan and Nutrition Goals: Nutrition Therapy & Goals - 01/05/19 1328      Nutrition Therapy   Diet  Low Na, HH diet    Protein (specify units)  55g    Fiber  25 grams    Whole Grain Foods  3 servings    Saturated Fats  12 max. grams    Fruits and Vegetables  5 servings/day    Sodium  1.5 grams      Personal Nutrition Goals   Nutrition Goal  ST:/LT:       Nutrition Assessments:   Nutrition Goals Re-Evaluation:   Nutrition Goals Discharge (Final Nutrition Goals Re-Evaluation):   Psychosocial: Target Goals: Acknowledge presence or absence of significant depression and/or stress, maximize coping skills, provide positive support system.  Participant is able to verbalize types and ability to use techniques and skills needed for reducing stress and depression.   Initial Review & Psychosocial Screening: Initial Psych Review & Screening - 01/02/19 1554      Initial Review   Current issues with  Current Sleep Concerns;Current Stress Concerns    Comments  Jacqlyn Larsen is having issues sleeping since her MI, she is hoping it will get better in the next week. She is handling the MI well and has a good support system, but this has definitely been a change in their everyday life.      Family Dynamics   Good Support System?  Yes      Barriers   Psychosocial barriers to participate in program  There are no identifiable barriers or psychosocial needs.      Screening Interventions   Interventions  Encouraged to exercise    Expected Outcomes  Short Term goal: Utilizing psychosocial counselor, staff and physician to assist with identification of specific Stressors or current issues interfering with healing process. Setting desired goal for each stressor or current issue identified.;Long Term Goal: Stressors or current issues are controlled or eliminated.;Short Term goal: Identification and review with participant of any Quality of Life or Depression concerns found by scoring the questionnaire.;Long Term goal: The participant improves quality of Life and PHQ9 Scores as seen by post scores and/or verbalization of changes       Quality of Life Scores:  Quality of Life - 01/05/19 1357      Quality of Life   Select  Quality of Life      Quality of Life Scores   Health/Function Pre  18.83 %    Socioeconomic Pre  28.38 %    Psych/Spiritual Pre  30 %    Family Pre  27.6 %    GLOBAL Pre  24.5 %      Scores of 19 and below usually indicate a poorer quality of life in these areas.  A difference of  2-3 points is a clinically meaningful difference.  A difference of 2-3 points in the total score of the Quality of Life Index has been associated with  significant improvement in overall quality of life, self-image, physical symptoms, and general health in studies assessing change in quality of life.  PHQ-9: Recent Review Flowsheet Data    Depression screen [facility] 2/9 01/05/2019 04/12/2018 02/23/2017 02/19/2016 08/14/2015   Decreased Interest 1 0 0 0 0   Down,  Depressed, Hopeless - 0 0 0 0   PHQ - 2 Score 1 0 0 0 0   Altered sleeping 0 - - - -   Tired, decreased energy 1 - - - -   Change in appetite 1 - - - -   Feeling bad or failure about yourself  0 - - - -   Trouble concentrating 1 - - - -   Moving slowly or fidgety/restless 0 - - - -   Suicidal thoughts 0 - - - -   PHQ-9 Score 4 - - - -   Difficult doing work/chores Not difficult at all - - - -     Interpretation of Total Score  Total Score Depression Severity:  1-4 = Minimal depression, 5-9 = Mild depression, 10-14 = Moderate depression, 15-19 = Moderately severe depression, 20-27 = Severe depression   Psychosocial Evaluation and Intervention:   Psychosocial Re-Evaluation:   Psychosocial Discharge (Final Psychosocial Re-Evaluation):   Vocational Rehabilitation: Provide vocational rehab assistance to qualifying candidates.   Vocational Rehab Evaluation & Intervention:   Education: Education Goals: Education classes will be provided on a variety of topics geared toward better understanding of heart health and risk factor modification. Participant will state understanding/return demonstration of topics presented as noted by education test scores.  Learning Barriers/Preferences: Learning Barriers/Preferences - 01/02/19 1550      Learning Barriers/Preferences   Learning Barriers  Sight   macular degeneration   Learning Preferences  None       Education Topics:  AED/CPR: - Group verbal and written instruction with the use of models to demonstrate the basic use of the AED with the basic ABC's of resuscitation.   General Nutrition Guidelines/Fats and Fiber: -Group  instruction provided by verbal, written material, models and posters to present the general guidelines for heart healthy nutrition. Gives an explanation and review of dietary fats and fiber.   Controlling Sodium/Reading Food Labels: -Group verbal and written material supporting the discussion of sodium use in heart healthy nutrition. Review and explanation with models, verbal and written materials for utilization of the food label.   Exercise Physiology & General Exercise Guidelines: - Group verbal and written instruction with models to review the exercise physiology of the cardiovascular system and associated critical values. Provides general exercise guidelines with specific guidelines to those with heart or lung disease.    Aerobic Exercise & Resistance Training: - Gives group verbal and written instruction on the various components of exercise. Focuses on aerobic and resistive training programs and the benefits of this training and how to safely progress through these programs..   Flexibility, Balance, Mind/Body Relaxation: Provides group verbal/written instruction on the benefits of flexibility and balance training, including mind/body exercise modes such as yoga, pilates and tai chi.  Demonstration and skill practice provided.   Stress and Anxiety: - Provides group verbal and written instruction about the health risks of elevated stress and causes of high stress.  Discuss the correlation between heart/lung disease and anxiety and treatment options. Review healthy ways to manage with stress and anxiety.   Depression: - Provides group verbal and written instruction on the correlation between heart/lung disease and depressed mood, treatment options, and the stigmas associated with seeking treatment.   Anatomy & Physiology of the Heart: - Group verbal and written instruction and models provide basic cardiac anatomy and physiology, with the coronary electrical and arterial systems.  Review of Valvular disease and Heart Failure   Cardiac Procedures: - Group verbal and written  instruction to review commonly prescribed medications for heart disease. Reviews the medication, class of the drug, and side effects. Includes the steps to properly store meds and maintain the prescription regimen. (beta blockers and nitrates)   Cardiac Medications I: - Group verbal and written instruction to review commonly prescribed medications for heart disease. Reviews the medication, class of the drug, and side effects. Includes the steps to properly store meds and maintain the prescription regimen.   Cardiac Medications II: -Group verbal and written instruction to review commonly prescribed medications for heart disease. Reviews the medication, class of the drug, and side effects. (all other drug classes)    Go Sex-Intimacy & Heart Disease, Get SMART - Goal Setting: - Group verbal and written instruction through game format to discuss heart disease and the return to sexual intimacy. Provides group verbal and written material to discuss and apply goal setting through the application of the S.M.A.R.T. Method.   Other Matters of the Heart: - Provides group verbal, written materials and models to describe Stable Angina and Peripheral Artery. Includes description of the disease process and treatment options available to the cardiac patient.   Exercise & Equipment Safety: - Individual verbal instruction and demonstration of equipment use and safety with use of the equipment.   Cardiac Rehab from 01/05/2019 in [facility] Cardiac and Pulmonary Rehab  Date  01/05/19  Educator  [place]  Instruction Review Code  1- Verbalizes Understanding      Infection Prevention: - Provides verbal and written material to individual with discussion of infection control including proper hand washing and proper equipment cleaning during exercise session.   Cardiac Rehab from 01/05/2019 in [facility] Cardiac and Pulmonary Rehab   Date  01/05/19  Educator  [place]  Instruction Review Code  1- Verbalizes Understanding      Falls Prevention: - Provides verbal and written material to individual with discussion of falls prevention and safety.   Cardiac Rehab from 01/05/2019 in [facility] Cardiac and Pulmonary Rehab  Date  01/05/19  Educator  [place]  Instruction Review Code  1- Verbalizes Understanding      Diabetes: - Individual verbal and written instruction to review signs/symptoms of diabetes, desired ranges of glucose level fasting, after meals and with exercise. Acknowledge that pre and post exercise glucose checks will be done for 3 sessions at entry of program.   Know Your Numbers and Risk Factors: -Group verbal and written instruction about important numbers in your health.  Discussion of what are risk factors and how they play a role in the disease process.  Review of Cholesterol, Blood Pressure, Diabetes, and BMI and the role they play in your overall health.   Sleep Hygiene: -Provides group verbal and written instruction about how sleep can affect your health.  Define sleep hygiene, discuss sleep cycles and impact of sleep habits. Review good sleep hygiene tips.    Other: -Provides group and verbal instruction on various topics (see comments)   Knowledge Questionnaire Score: Knowledge Questionnaire Score - 01/05/19 1357      Knowledge Questionnaire Score   Pre Score  25/26 (Exercise question missed)  reviewed correst response with Wells Guiles today. She verbalized understanding of the response.       Core Components/Risk Factors/Patient Goals at Admission: Personal Goals and Risk Factors at Admission - 01/02/19 1549      Core Components/Risk Factors/Patient Goals on Admission   Hypertension  Yes    Intervention  Provide education on lifestyle modifcations including regular physical activity/exercise, weight management, moderate sodium  restriction and increased consumption of fresh fruit, vegetables, and low  fat dairy, alcohol moderation, and smoking cessation.;Monitor prescription use compliance.    Expected Outcomes  Short Term: Continued assessment and intervention until BP is < 140/42m HG in hypertensive participants. < 130/842mHG in hypertensive participants with diabetes, heart failure or chronic kidney disease.;Long Term: Maintenance of blood pressure at goal levels.    Lipids  Yes    Intervention  Provide education and support for participant on nutrition & aerobic/resistive exercise along with prescribed medications to achieve LDL <7025mHDL >45m86m  Expected Outcomes  Short Term: Participant states understanding of desired cholesterol values and is compliant with medications prescribed. Participant is following exercise prescription and nutrition guidelines.;Long Term: Cholesterol controlled with medications as prescribed, with individualized exercise RX and with personalized nutrition plan. Value goals: LDL < 70mg33mL > 40 mg.       Core Components/Risk Factors/Patient Goals Review:    Core Components/Risk Factors/Patient Goals at Discharge (Final Review):    ITP Comments: ITP Comments    Row Name 01/02/19 1556           ITP Comments  Virtual orientation completed. [facility]. EP/RD orientation scheduled for 7/23 at pm          Comments: Initial ITP

## 2019-01-05 NOTE — Progress Notes (Signed)
Daily Session Note  Patient Details  Name: Teresa Moyer MRN: [id number] Date of Birth: 06/24/1935 Referring Provider:     Cardiac Rehab from 01/05/2019 in [facility] Cardiac and Pulmonary Rehab  Referring Provider  Nehemiah Massed      Encounter Date: 01/05/2019  Check In: Session Check In - 01/05/19 1429      Check-In   Supervising physician immediately available to respond to emergencies  See telemetry face sheet for immediately available ER MD    Location  [facility]-Cardiac & Pulmonary Rehab    Staff Present  Renita Papa, RN BSN;Jeanna Durrell BS, Exercise Physiologist    Virtual Visit  No    Medication changes reported      No    Fall or balance concerns reported     No    Tobacco Cessation  No Change    Warm-up and Cool-down  Not performed (comment)    Resistance Training Performed  No    VAD Patient?  No    PAD/SET Patient?  No          Social History   Tobacco Use  Smoking Status Never Smoker  Smokeless Tobacco Never Used    Goals Met:  Exercise tolerated well No report of cardiac concerns or symptoms  Goals Unmet:  Not Applicable  Comments:  6 Minute Walk    Row Name 01/05/19 1432         6 Minute Walk   Phase  Initial     Distance  263 feet     Walk Time  2 minutes     MPH  1.49     METS  0.31     RPE  13     Perceived Dyspnea   3     VO2 Peak  1.1     Symptoms  Yes (comment)     Comments  Patient stopped due to shortness of breath which she reports is increased when wearing a mask. SpO2 90% increased to 96%. Patient did not wish to resume test until after time had expired.     Resting HR  79 bpm     Resting BP  132/80     Exercise Oxygen Saturation  during 6 min walk  90 %     Max Ex. HR  84 bpm     Max Ex. BP  146/80     2 Minute Post BP  122/68          Dr. Emily Filbert is Medical Director for [place] and [name].

## 2019-01-06 DIAGNOSIS — H353211 Exudative age-related macular degeneration, right eye, with active choroidal neovascularization: Secondary | ICD-10-CM | POA: Diagnosis not present

## 2019-01-06 DIAGNOSIS — H353122 Nonexudative age-related macular degeneration, left eye, intermediate dry stage: Secondary | ICD-10-CM | POA: Diagnosis not present

## 2019-01-09 ENCOUNTER — Other Ambulatory Visit: Payer: Self-pay

## 2019-01-09 ENCOUNTER — Encounter: Payer: Medicare Other | Admitting: *Deleted

## 2019-01-09 DIAGNOSIS — I213 ST elevation (STEMI) myocardial infarction of unspecified site: Secondary | ICD-10-CM

## 2019-01-09 NOTE — Progress Notes (Signed)
Daily Session Note  Patient Details  Name: Teresa Moyer MRN: [id number] Date of Birth: 1936-02-24 Referring Provider:     Cardiac Rehab from 01/05/2019 in [facility] Cardiac and Pulmonary Rehab  Referring Provider  Nehemiah Massed      Encounter Date: 01/09/2019  Check In: Session Check In - 01/09/19 1439      Check-In   Supervising physician immediately available to respond to emergencies  See telemetry face sheet for immediately available ER MD    Location  [facility]-Cardiac & Pulmonary Rehab    Staff Present  Renita Papa, RN Moises Blood, BS, ACSM CEP, Exercise Physiologist;Joseph Tessie Fass RCP,RRT,BSRT    Virtual Visit  No    Medication changes reported      No    Fall or balance concerns reported     No    Warm-up and Cool-down  Performed on first and last piece of equipment    Resistance Training Performed  Yes    VAD Patient?  No    PAD/SET Patient?  No      Pain Assessment   Currently in Pain?  No/denies          Social History   Tobacco Use  Smoking Status Never Smoker  Smokeless Tobacco Never Used    Goals Met:  Independence with exercise equipment Exercise tolerated well No report of cardiac concerns or symptoms Strength training completed today  Goals Unmet:  Not Applicable  Comments: First full day of exercise!  Patient was oriented to gym and equipment including functions, settings, policies, and procedures.  Patient's individual exercise prescription and treatment plan were reviewed.  All starting workloads were established based on the results of the 6 minute walk test done at initial orientation visit.  The plan for exercise progression was also introduced and progression will be customized based on patient's performance and goals.    Dr. Emily Filbert is Medical Director for [place] and [name].

## 2019-01-11 DIAGNOSIS — I2510 Atherosclerotic heart disease of native coronary artery without angina pectoris: Secondary | ICD-10-CM | POA: Insufficient documentation

## 2019-01-11 DIAGNOSIS — I10 Essential (primary) hypertension: Secondary | ICD-10-CM | POA: Diagnosis not present

## 2019-01-11 DIAGNOSIS — I2102 ST elevation (STEMI) myocardial infarction involving left anterior descending coronary artery: Secondary | ICD-10-CM | POA: Diagnosis not present

## 2019-01-11 DIAGNOSIS — E785 Hyperlipidemia, unspecified: Secondary | ICD-10-CM | POA: Diagnosis not present

## 2019-01-12 ENCOUNTER — Ambulatory Visit: Payer: Medicare Other

## 2019-01-12 ENCOUNTER — Encounter: Payer: Medicare Other | Admitting: *Deleted

## 2019-01-12 ENCOUNTER — Telehealth: Payer: Self-pay

## 2019-01-12 ENCOUNTER — Other Ambulatory Visit: Payer: Self-pay

## 2019-01-12 DIAGNOSIS — I213 ST elevation (STEMI) myocardial infarction of unspecified site: Secondary | ICD-10-CM

## 2019-01-12 LAB — LIPID PANEL
Chol/HDL Ratio: 1.8 ratio (ref 0.0–4.4)
Cholesterol, Total: 112 mg/dL (ref 100–199)
HDL: 61 mg/dL (ref >39)
LDL Calculated: 28 mg/dL (ref 0–99)
Triglycerides: 113 mg/dL (ref 0–149)
VLDL Cholesterol Cal: 23 mg/dL (ref 5–40)

## 2019-01-12 LAB — COMPREHENSIVE METABOLIC PANEL
ALT: 36 IU/L — ABNORMAL HIGH (ref 0–32)
AST: 40 IU/L (ref 0–40)
Albumin/Globulin Ratio: 1.6 (ref 1.2–2.2)
Albumin: 4.1 g/dL (ref 3.6–4.6)
Alkaline Phosphatase: 155 IU/L — ABNORMAL HIGH (ref 39–117)
BUN/Creatinine Ratio: 16 (ref 12–28)
BUN: 15 mg/dL (ref 8–27)
Bilirubin Total: 0.8 mg/dL (ref 0.0–1.2)
CO2: 20 mmol/L (ref 20–29)
Calcium: 9.2 mg/dL (ref 8.7–10.3)
Chloride: 101 mmol/L (ref 96–106)
Creatinine, Ser: 0.91 mg/dL (ref 0.57–1.00)
GFR calc Af Amer: 68 mL/min/{1.73_m2} (ref >59)
GFR calc non Af Amer: 59 mL/min/{1.73_m2} — ABNORMAL LOW (ref >59)
Globulin, Total: 2.5 g/dL (ref 1.5–4.5)
Glucose: 88 mg/dL (ref 65–99)
Potassium: 4.1 mmol/L (ref 3.5–5.2)
Sodium: 138 mmol/L (ref 134–144)
Total Protein: 6.6 g/dL (ref 6.0–8.5)

## 2019-01-12 NOTE — Progress Notes (Signed)
Daily Session Note  Patient Details  Name: Teresa Moyer MRN: [id number] Date of Birth: 01/21/1936 Referring Provider:     Cardiac Rehab from 01/05/2019 in [facility] Cardiac and Pulmonary Rehab  Referring Provider  Kowalski      Encounter Date: 01/12/2019  Check In: Session Check In - 01/12/19 1439      Check-In   Supervising physician immediately available to respond to emergencies  See telemetry face sheet for immediately available ER MD    Location  [facility]-Cardiac & Pulmonary Rehab    Staff Present  Meredith Craven, RN BSN;Jessica Hawkins, MA, RCEP, CCRP, CCET;Joseph Hood RCP,RRT,BSRT    Virtual Visit  No    Medication changes reported      No    Fall or balance concerns reported     No    Warm-up and Cool-down  Performed on first and last piece of equipment    Resistance Training Performed  Yes    VAD Patient?  No    PAD/SET Patient?  No      Pain Assessment   Currently in Pain?  No/denies          Social History   Tobacco Use  Smoking Status Never Smoker  Smokeless Tobacco Never Used    Goals Met:  Independence with exercise equipment Exercise tolerated well No report of cardiac concerns or symptoms Strength training completed today  Goals Unmet:  Not Applicable  Comments: Pt able to follow exercise prescription today without complaint.  Will continue to monitor for progression.    Dr. Mark Miller is Medical Director for [facility] and [name]. 

## 2019-01-12 NOTE — Telephone Encounter (Signed)
Left message to call back  

## 2019-01-12 NOTE — Chronic Care Management (AMB) (Signed)
  Chronic Care Management   Note  01/12/2019 Name: Teresa Moyer MRN: [id number] DOB: 1936-05-24  Care Coordination: Successful telephone encounter to [name], 83 year old female patient of Dr. Miguel Aschoff, who was referred to Chronic Case Management by her health plan. She was consented to services 12/15/2018 and her initial health assessment and health goal setting appointment was scheduled for today at 1:00.  Unfortunately, after patient consented to services, she was hospitalized 12/27/2018 with a STEMI. She is currently attending cardiac rehab at [facility] on M, W, and Th at 2:00. She forgot today's appointment as wishes to reschedule as she is about to leave for rehab.  Plan: Appointment rescheduled for 01/24/2019 at 2:00. Patient provided RN CM contact information  Teresa Moyer E. Rollene Rotunda, RN, BSN Nurse Care Coordinator [facility] Care Management [phone]

## 2019-01-12 NOTE — Telephone Encounter (Signed)
-----  Message from Jerrol Banana., MD sent at 01/12/2019 10:26 AM EDT ----- Labs ok--plan to repeat met C on next visit--make it 2-3 months.

## 2019-01-13 NOTE — Telephone Encounter (Signed)
Advised patient of results on 01/12/2019.

## 2019-01-14 DIAGNOSIS — I219 Acute myocardial infarction, unspecified: Secondary | ICD-10-CM

## 2019-01-14 HISTORY — DX: Acute myocardial infarction, unspecified: I21.9

## 2019-01-16 ENCOUNTER — Other Ambulatory Visit: Payer: Self-pay

## 2019-01-16 ENCOUNTER — Encounter: Payer: Medicare Other | Attending: Internal Medicine | Admitting: *Deleted

## 2019-01-16 DIAGNOSIS — I213 ST elevation (STEMI) myocardial infarction of unspecified site: Secondary | ICD-10-CM | POA: Diagnosis not present

## 2019-01-16 NOTE — Progress Notes (Signed)
Daily Session Note  Patient Details  Name: Teresa Moyer MRN: [id number] Date of Birth: 1936/03/13 Referring Provider:     Cardiac Rehab from 01/05/2019 in [facility] Cardiac and Pulmonary Rehab  Referring Provider  Nehemiah Massed      Encounter Date: 01/16/2019  Check In: Session Check In - 01/16/19 1428      Check-In   Supervising physician immediately available to respond to emergencies  See telemetry face sheet for immediately available ER MD    Location  [facility]-Cardiac & Pulmonary Rehab    Staff Present  Renita Papa, RN BSN;Melissa Caiola RDN, LDN;Joseph Tessie Fass RCP,RRT,BSRT    Virtual Visit  No    Medication changes reported      No    Fall or balance concerns reported     No    Warm-up and Cool-down  Performed on first and last piece of equipment    Resistance Training Performed  Yes    VAD Patient?  No      Pain Assessment   Currently in Pain?  No/denies          Social History   Tobacco Use  Smoking Status Never Smoker  Smokeless Tobacco Never Used    Goals Met:  Independence with exercise equipment Exercise tolerated well No report of cardiac concerns or symptoms Strength training completed today  Goals Unmet:  Not Applicable  Comments: Pt able to follow exercise prescription today without complaint.  Will continue to monitor for progression.    Dr. Emily Filbert is Medical Director for [place] and [name].

## 2019-01-18 ENCOUNTER — Other Ambulatory Visit: Payer: Self-pay

## 2019-01-18 DIAGNOSIS — I213 ST elevation (STEMI) myocardial infarction of unspecified site: Secondary | ICD-10-CM

## 2019-01-18 NOTE — Progress Notes (Signed)
Daily Session Note  Patient Details  Name: Teresa Moyer MRN: [id number] Date of Birth: Jan 16, 1936 Referring Provider:     Cardiac Rehab from 01/05/2019 in [facility] Cardiac and Pulmonary Rehab  Referring Provider  Nehemiah Massed      Encounter Date: 01/18/2019  Check In: Session Check In - 01/18/19 1442      Check-In   Supervising physician immediately available to respond to emergencies  See telemetry face sheet for immediately available ER MD    Location  [facility]-Cardiac & Pulmonary Rehab    Staff Present  Justin Mend RCP,RRT,BSRT;Melissa Caiola RDN, Rowe Pavy, BA, ACSM CEP, Exercise Physiologist;Mary Kellie Shropshire, RN, BSN, MA    Virtual Visit  No    Medication changes reported      No    Fall or balance concerns reported     No    Warm-up and Cool-down  Performed on first and last piece of equipment    Resistance Training Performed  Yes    VAD Patient?  No    PAD/SET Patient?  No      Pain Assessment   Currently in Pain?  No/denies          Social History   Tobacco Use  Smoking Status Never Smoker  Smokeless Tobacco Never Used    Goals Met:  Independence with exercise equipment Exercise tolerated well No report of cardiac concerns or symptoms Strength training completed today  Goals Unmet:  Not Applicable  Comments: Pt able to follow exercise prescription today without complaint.  Will continue to monitor for progression.    Dr. Emily Filbert is Medical Director for [place] and [name].

## 2019-01-19 ENCOUNTER — Encounter: Payer: Medicare Other | Admitting: *Deleted

## 2019-01-19 DIAGNOSIS — I213 ST elevation (STEMI) myocardial infarction of unspecified site: Secondary | ICD-10-CM

## 2019-01-19 NOTE — Progress Notes (Signed)
Daily Session Note  Patient Details  Name: Teresa Moyer MRN: [id number] Date of Birth: [birth year] Referring Provider:     Cardiac Rehab from 01/05/2019 in [facility] Cardiac and Pulmonary Rehab  Referring Provider  Nehemiah Massed      Encounter Date: 01/19/2019  Check In: Session Check In - 01/19/19 1432      Check-In   Supervising physician immediately available to respond to emergencies  See telemetry face sheet for immediately available ER MD    Location  [facility]-Cardiac & Pulmonary Rehab    Staff Present  Renita Papa, RN BSN;Laureen Owens Shark, BS, RRT, CPFT;Amanda Oletta Darter, BA, ACSM CEP, Exercise Physiologist;Joseph Tessie Fass RCP,RRT,BSRT    Virtual Visit  No    Medication changes reported      No    Fall or balance concerns reported     No    Warm-up and Cool-down  Performed on first and last piece of equipment    Resistance Training Performed  Yes    VAD Patient?  No    PAD/SET Patient?  No      Pain Assessment   Currently in Pain?  No/denies          Social History   Tobacco Use  Smoking Status Never Smoker  Smokeless Tobacco Never Used    Goals Met:  Independence with exercise equipment Exercise tolerated well No report of cardiac concerns or symptoms Strength training completed today  Goals Unmet:  Not Applicable  Comments: Pt able to follow exercise prescription today without complaint.  Will continue to monitor for progression.    Dr. Emily Filbert is Medical Director for [place] and [name].

## 2019-01-23 ENCOUNTER — Other Ambulatory Visit: Payer: Self-pay

## 2019-01-23 ENCOUNTER — Encounter: Payer: Medicare Other | Admitting: *Deleted

## 2019-01-23 DIAGNOSIS — I213 ST elevation (STEMI) myocardial infarction of unspecified site: Secondary | ICD-10-CM | POA: Diagnosis not present

## 2019-01-23 NOTE — Progress Notes (Signed)
Daily Session Note  Patient Details  Name: Teresa Moyer MRN: [id number] Date of Birth: 09/07/1935 Referring Provider:     Cardiac Rehab from 01/05/2019 in [facility] Cardiac and Pulmonary Rehab  Referring Provider  Nehemiah Massed      Encounter Date: 01/23/2019  Check In: Session Check In - 01/23/19 1432      Check-In   Supervising physician immediately available to respond to emergencies  See telemetry face sheet for immediately available ER MD    Location  [facility]-Cardiac & Pulmonary Rehab    Staff Present  Renita Papa, RN Vickki Hearing, BA, ACSM CEP, Exercise Physiologist;Joseph [facility] [place], [place], ACSM CEP, Exercise Physiologist    Virtual Visit  No    Medication changes reported      No    Fall or balance concerns reported     No    Warm-up and Cool-down  Performed on first and last piece of equipment    Resistance Training Performed  Yes    VAD Patient?  No    PAD/SET Patient?  No      Pain Assessment   Currently in Pain?  No/denies          Social History   Tobacco Use  Smoking Status Never Smoker  Smokeless Tobacco Never Used    Goals Met:  Independence with exercise equipment Exercise tolerated well No report of cardiac concerns or symptoms Strength training completed today  Goals Unmet:  Not Applicable  Comments: Pt able to follow exercise prescription today without complaint.  Will continue to monitor for progression.    Dr. Emily Filbert is Medical Director for [place] and [name].

## 2019-01-24 ENCOUNTER — Ambulatory Visit: Payer: Medicare Other

## 2019-01-24 DIAGNOSIS — I10 Essential (primary) hypertension: Secondary | ICD-10-CM

## 2019-01-24 DIAGNOSIS — I2102 ST elevation (STEMI) myocardial infarction involving left anterior descending coronary artery: Secondary | ICD-10-CM

## 2019-01-24 NOTE — Chronic Care Management (AMB) (Signed)
Chronic Care Management   Initial Visit Note  01/24/2019 Name: Teresa Moyer MRN: [id number] DOB: 1936-03-28  Subjective: "I have this ongoing shortness of breath so bad that I can't increase my workload on the treadmill"  Objective:  Lab Results  Component Value Date   HGBA1C 5.8 (H) 12/28/2018   BP Readings from Last 3 Encounters:  01/03/19 128/74  12/29/18 139/76  11/17/18 (!) 177/69   Lab Results  Component Value Date   CHOL 112 01/11/2019   HDL 61 01/11/2019   LDLCALC 28 01/11/2019   TRIG 113 01/11/2019   CHOLHDL 1.8 01/11/2019    Assessment: Teresa Moyer. Hohmann is an 83 year old female patient of Dr. Miguel Aschoff, who was referred to Chronic Case Management by her health plan. After referral was placed, patient was hospitalized with a STEMI requiring stent placement. She has a history of but not limited to HTN, STEMI, GERD, HLD, and Skin CA.  Review of patient status, including review of consultants reports, relevant laboratory and other test results, and collaboration with appropriate care team members and the patient's provider was performed as part of comprehensive patient evaluation and provision of chronic care management services.     Advanced Directives 01/24/2019  Does Patient Have a Medical Advance Directive? Yes  Type of Paramedic of [place];Living will  Does patient want to make changes to medical advance directive? No - Patient declined  Copy of [place] in Chart? No - copy requested  Would patient like information on creating a medical advance directive? -     Goals Addressed            This Visit's Progress   . I need to make sure my stent does not close up (pt-stated)       Teresa Moyer recently was hospitalized for STEMI requiring stenting of the LAD. She was prescribed Brillinta and discharged home without any complications. She has a residual blockage of 60% in the mid RCA that is being medically  managed. She is attending cardiac rehab but is limited by ongoing shortness of breath. She has notified her cardiologist, who changed her antiplatelet from brillinta to plavix in hopes this would resolve her shortness of breath. She took her last brillinta yesterday and her first dose of plavix today. She is to call cardiologist next week to discuss symptoms. States if her shortness of breath is not improved, is may be secondary to her remaining 60% blockage in the RCA or some InStent restenosis. She denies CP/CPressure.  Teresa Moyer is not checking her BP at home as it is checked 3 x weekly at cardiac rehab. She is willing to check at home and record on days she is not in rehab. She is following a heart healthy diet.   Current Barriers:  . Chronic Disease Management support and education needs related to CAD  Nurse Case Manager Clinical Goal(s):  Marland Kitchen Over the next 14 days, patient will verbalize understanding of plan for CAD management including exercise, heart healthy diet, medication management/compliance (statin, betablocker, antiplatelet), BP control . Over the next 90 days, patient will not experience hospital admission. Hospital Admissions in last 6 months = 1  Interventions:  . Evaluation of current treatment plan related to CAD/recent MI and patient's adherence to plan as established by provider. . Discussed plans with patient for ongoing care management follow up and provided patient with direct contact information for care management team . Provided emotional support and reassurance  regarding transition from [place] to Plavix secondary to ongoing shortness of breath . Reviewed s/s of MI and assessed patients knowledge of when to call 911 . Reviewed scheduled/upcoming provider appointments including: 02/10/2019 with cardiology . Encouraged patient to check BP weekly and record to establish a baseline . Reviewed recent cardiology and cardiac rehab notes  Patient Self Care Activities:  .  Self administers medications as prescribed . Attends all scheduled provider appointments . Calls pharmacy for medication refills . Attends church or other social activities . Performs ADL's independently . Performs IADL's independently . Calls provider office for new concerns or questions . Attends Cardiac Rehab  Initial goal documentation         Follow up plan:  Telephone follow up appointment with care management team member scheduled for: 02/06/2019 at 11:00   [place]. Rollene Rotunda, RN, BSN Nurse Care Coordinator [facility] Care Management [phone]

## 2019-01-24 NOTE — Patient Instructions (Addendum)
Thank you allowing the Chronic Care Management Team to be a part of your care! It was a pleasure speaking with you today!  1. Continue to take all medications as prescribed 2. Please begin to check your blood pressure at home and record. Its a good idea to take this with you to your cardiology appointments 3. Read the information on heart healthy diet below. 4. Continue with Cardiac Rehab as scheduled. Not only will you benefit from exercise, you will also benefit from the educational classes/information they provide 5. Please contact your cardiologist next week as discussed/instructed to report improvement/worsening of shortness of breath now that you have switched from brillinta to plavix. 6.   CCM (Chronic Care Management) Team   Trish Fountain RN, BSN Nurse Care Coordinator  [phone]  Ruben Reason PharmD  Clinical Pharmacist  [phone]   Elliot Gurney, LCSW Clinical Social Worker [phone]  Goals Addressed            This Visit's Progress   . I need to make sure my stent does not close up (pt-stated)       Current Barriers:  . Chronic Disease Management support and education needs related to CAD  Nurse Case Manager Clinical Goal(s):  Marland Kitchen Over the next 14 days, patient will verbalize understanding of plan for CAD management including exercise, heart healthy diet, medication management/compliance (statin, betablocker, antiplatelet), BP control . Over the next 90 days, patient will not experience hospital admission. Hospital Admissions in last 6 months = 1  Interventions:  . Evaluation of current treatment plan related to CAD/recent MI and patient's adherence to plan as established by provider. . Discussed plans with patient for ongoing care management follow up and provided patient with direct contact information for care management team . Provided emotional support and reassurance regarding transition from [place] to Plavix secondary to ongoing shortness of  breath . Reviewed s/s of MI and assessed patients knowledge of when to call 911 . Reviewed scheduled/upcoming provider appointments including: 02/10/2019 with cardiology . Encouraged patient to check BP weekly and record to establish a baseline . Reviewed recent cardiology and cardiac rehab notes  Patient Self Care Activities:  . Self administers medications as prescribed . Attends all scheduled provider appointments . Calls pharmacy for medication refills . Attends church or other social activities . Performs ADL's independently . Performs IADL's independently . Calls provider office for new concerns or questions . Attends Cardiac Rehab  Initial goal documentation        Print copy of patient instructions provided.   Telephone follow up appointment with care management team member scheduled for:  SYMPTOMS OF A STROKE   You have any symptoms of stroke. "BE FAST" is an easy way to remember the main warning signs: ? B - Balance. Signs are dizziness, sudden trouble walking, or loss of balance. ? E - Eyes. Signs are trouble seeing or a sudden change in how you see. ? F - Face. Signs are sudden weakness or loss of feeling of the face, or the face or eyelid drooping on one side. ? A - Arms. Signs are weakness or loss of feeling in an arm. This happens suddenly and usually on one side of the body. ? S - Speech. Signs are sudden trouble speaking, slurred speech, or trouble understanding what people say. ? T - Time. Time to call emergency services. Write down what time symptoms started.  You have other signs of stroke, such as: ? A sudden, very bad  headache with no known cause. ? Feeling sick to your stomach (nausea). ? Throwing up (vomiting). ? Jerky movements you cannot control (seizure).  SYMPTOMS OF A HEART ATTACK  What are the signs or symptoms? Symptoms of this condition include:  Chest pain. It may feel like: ? Crushing or squeezing. ? Tightness, pressure, fullness, or  heaviness.  Pain in the arm, neck, jaw, back, or upper body.  Shortness of breath.  Heartburn.  Indigestion.  Nausea.  Cold sweats.  Feeling tired.  Sudden lightheadedness.   Heart-Healthy Eating Plan Many factors influence your heart (coronary) health, including eating and exercise habits. Coronary risk increases with abnormal blood fat (lipid) levels. Heart-healthy meal planning includes limiting unhealthy fats, increasing healthy fats, and making other diet and lifestyle changes.  What are tips for following this plan?  Cooking Cook foods using methods other than frying. Baking, boiling, grilling, and broiling are all good options. Other ways to reduce fat include:  Removing the skin from poultry.  Removing all visible fats from meats.  Steaming vegetables in water or broth.  Meal planning  At meals, imagine dividing your plate into fourths: ? Fill one-half of your plate with vegetables and green salads. ? Fill one-fourth of your plate with whole grains. ? Fill one-fourth of your plate with lean protein foods.  Eat 4-5 servings of vegetables per day. One serving equals 1 cup raw or cooked vegetable, or 2 cups raw leafy greens.  Eat 4-5 servings of fruit per day. One serving equals 1 medium whole fruit,  cup dried fruit,  cup fresh, frozen, or canned fruit, or  cup 100% fruit juice.  Eat more foods that contain soluble fiber. Examples include apples, broccoli, carrots, beans, peas, and barley. Aim to get 25-30 g of fiber per day.  Increase your consumption of legumes, nuts, and seeds to 4-5 servings per week. One serving of dried beans or legumes equals  cup cooked, 1 serving of nuts is  cup, and 1 serving of seeds equals 1 tablespoon.  Fats  Choose healthy fats more often. Choose monounsaturated and polyunsaturated fats, such as olive and canola oils, flaxseeds, walnuts, almonds, and seeds.  Eat more omega-3 fats. Choose salmon, mackerel, sardines, tuna,  flaxseed oil, and ground flaxseeds. Aim to eat fish at least 2 times each week.  Check food labels carefully to identify foods with trans fats or high amounts of saturated fat.  Limit saturated fats. These are found in animal products, such as meats, butter, and cream. Plant sources of saturated fats include palm oil, palm kernel oil, and coconut oil.  Avoid foods with partially hydrogenated oils in them. These contain trans fats. Examples are stick margarine, some tub margarines, cookies, crackers, and other baked goods.  Avoid fried foods.  General information  Eat more home-cooked food and less restaurant, buffet, and fast food.  Limit or avoid alcohol.  Limit foods that are high in starch and sugar.  Lose weight if you are overweight. Losing just 5-10% of your body weight can help your overall health and prevent diseases such as diabetes and heart disease.  Monitor your salt (sodium) intake, especially if you have high blood pressure. Talk with your health care provider about your sodium intake.  Try to incorporate more vegetarian meals weekly.  What foods can I eat? Fruits All fresh, canned (in natural juice), or frozen fruits. Vegetables Fresh or frozen vegetables (raw, steamed, roasted, or grilled). Green salads. Grains Most grains. Choose whole wheat and whole grains  most of the time. Rice and pasta, including brown rice and pastas made with whole wheat. Meats and other proteins Lean, well-trimmed beef, veal, pork, and lamb. Chicken and [place] without skin. All fish and shellfish. Wild duck, rabbit, pheasant, and venison. Egg whites or low-cholesterol egg substitutes. Dried beans, peas, lentils, and tofu. Seeds and most nuts. Dairy Low-fat or nonfat cheeses, including ricotta and mozzarella. Skim or 1% milk (liquid, powdered, or evaporated). Buttermilk made with low-fat milk. Nonfat or low-fat yogurt. Fats and oils Non-hydrogenated (trans-free) margarines. Vegetable oils,  including soybean, sesame, sunflower, olive, peanut, safflower, corn, canola, and cottonseed. Salad dressings or mayonnaise made with a vegetable oil. Beverages Water (mineral or sparkling). Coffee and tea. Diet carbonated beverages. Sweets and desserts Sherbet, gelatin, and fruit ice. Small amounts of dark chocolate. Limit all sweets and desserts. Seasonings and condiments Low sodium seasonings and condiments. The items listed above may not be a complete list of foods and beverages you can eat. Contact a dietitian for more options.  What foods are NOT recommended? Fruits Canned fruit in heavy syrup. Fruit in cream or butter sauce. Fried fruit. Limit coconut. Vegetables Vegetables cooked in cheese, cream, or butter sauce. Fried vegetables. Grains Breads made with saturated or trans fats, oils, or whole milk. Croissants. Sweet rolls. Donuts. High-fat crackers, such as cheese crackers. Meats and other proteins Fatty meats, such as hot dogs, ribs, sausage, bacon, rib-eye roast or steak. High-fat deli meats, such as salami and bologna. Caviar. Domestic duck and goose. Organ meats, such as liver. Dairy Cream, sour cream, cream cheese, and creamed cottage cheese. Whole milk cheeses. Whole or 2% milk (liquid, evaporated, or condensed). Whole buttermilk. Cream sauce or high-fat cheese sauce. Whole-milk yogurt. Fats and oils Meat fat, or shortening. Cocoa butter, hydrogenated oils, palm oil, coconut oil, palm kernel oil. Solid fats and shortenings, including bacon fat, salt pork, lard, and butter. Nondairy cream substitutes. Salad dressings with cheese or sour cream. Beverages Regular sodas and any drinks with added sugar. Sweets and desserts Frosting. Pudding. Cookies. Cakes. Pies. Milk chocolate or white chocolate. Buttered syrups. Full-fat ice cream or ice cream drinks. The items listed above may not be a complete list of foods and beverages to avoid. Contact a dietitian for more  information.  Summary  Heart-healthy meal planning includes limiting unhealthy fats, increasing healthy fats, and making other diet and lifestyle changes.  Lose weight if you are overweight. Losing just 5-10% of your body weight can help your overall health and prevent diseases such as diabetes and heart disease.  Focus on eating a balance of foods, including fruits and vegetables, low-fat or nonfat dairy, lean protein, nuts and legumes, whole grains, and heart-healthy oils and fats. This information is not intended to replace advice given to you by your health care provider. Make sure you discuss any questions you have with your health care provider. Document Released: 03/10/2008 Document Revised: 07/09/2017 Document Reviewed: 07/09/2017 Elsevier Patient Education  2020 [employer].

## 2019-01-25 ENCOUNTER — Other Ambulatory Visit: Payer: Self-pay

## 2019-01-25 ENCOUNTER — Encounter: Payer: Self-pay | Admitting: *Deleted

## 2019-01-25 DIAGNOSIS — I213 ST elevation (STEMI) myocardial infarction of unspecified site: Secondary | ICD-10-CM

## 2019-01-25 NOTE — Progress Notes (Signed)
Daily Session Note  Patient Details  Name: Teresa Moyer MRN: [id number] Date of Birth: October 03, 1935 Referring Provider:     Cardiac Rehab from 01/05/2019 in [facility]  Referring Provider  Nehemiah Massed      Encounter Date: 01/25/2019  Check In:      Social History   Tobacco Use  Smoking Status Never Smoker  Smokeless Tobacco Never Used    Goals Met:  Independence with exercise equipment Exercise tolerated well No report of cardiac concerns or symptoms Strength training completed today  Goals Unmet:  Not Applicable  Comments: Pt able to follow exercise prescription today without complaint.  Will continue to monitor for progression.    Dr. Emily Filbert is Medical Director for [facility] and [name].

## 2019-01-25 NOTE — Progress Notes (Signed)
Cardiac Individual Treatment Plan  Patient Details  Name: ARMELLA STOGNER MRN: [id number] Date of Birth: [birth year] Referring Provider:     Cardiac Rehab from 01/05/2019 in [facility] Cardiac and Pulmonary Rehab  Referring Provider  Nehemiah Massed      Initial Encounter Date:    Cardiac Rehab from 01/05/2019 in [facility] Cardiac and Pulmonary Rehab  Date  01/05/19      Visit Diagnosis: ST elevation myocardial infarction (STEMI), unspecified artery ([place])  Patient's Home Medications on Admission:  Current Outpatient Medications:  .  acetaminophen (TYLENOL) 650 MG CR tablet, Take 650 mg by mouth every 8 (eight) hours as needed for pain., Disp: , Rfl:  .  aspirin 81 MG chewable tablet, Chew 1 tablet (81 mg total) by mouth daily., Disp: 30 tablet, Rfl: 0 .  atorvastatin (LIPITOR) 40 MG tablet, Take 1 tablet (40 mg total) by mouth daily at 6 PM., Disp: 30 tablet, Rfl: 0 .  azelastine (ASTELIN) 0.1 % nasal spray, Place 1 spray into both nostrils at bedtime. , Disp: , Rfl:  .  Carboxymethylcell-Hypromellose (GENTEAL) 0.25-0.3 % GEL, Apply 1 application to eye at bedtime., Disp: , Rfl:  .  carvedilol (COREG) 6.25 MG tablet, Take 1 tablet (6.25 mg total) by mouth 2 (two) times daily with a meal., Disp: 60 tablet, Rfl: 0 .  cetirizine (ZYRTEC ALLERGY) 10 MG tablet, Take 10 mg by mouth at bedtime. , Disp: , Rfl:  .  clopidogrel (PLAVIX) 75 MG tablet, Take 1 tablet by mouth daily., Disp: , Rfl:  .  fluticasone (FLONASE) 50 MCG/ACT nasal spray, Place 1 spray into both nostrils daily. , Disp: , Rfl:  .  furosemide (LASIX) 20 MG tablet, TAKE 1 TABLET BY MOUTH ONCE DAILY., Disp: 30 tablet, Rfl: 11 .  irbesartan (AVAPRO) 300 MG tablet, Take 0.5 tablets (150 mg total) by mouth daily., Disp: 30 tablet, Rfl: 0 .  Multiple Vitamins-Minerals (PRESERVISION AREDS PO), Take 1 tablet by mouth 2 (two) times daily. , Disp: , Rfl:  .  Nutritional Supplements (JOINT FORMULA PO), Take 1 capsule by mouth daily before breakfast.  Arthrozene, Disp: , Rfl:  .  omeprazole (PRILOSEC) 20 MG capsule, Take 1 capsule (20 mg total) by mouth daily., Disp: 90 capsule, Rfl: 3 .  Polyethyl Glycol-Propyl Glycol (SYSTANE) 0.4-0.3 % SOLN, Place 1 drop into both eyes 3 (three) times daily as needed (for dry eyes)., Disp: , Rfl:  .  Probiotic Product (ALIGN) 4 MG CAPS, Take 4 mg by mouth daily after lunch. , Disp: , Rfl:   Past Medical History: Past Medical History:  Diagnosis Date  . Cataracts, bilateral   . Environmental allergies   . GERD (gastroesophageal reflux disease)   . Heart murmur 2019   had since a child. dr. Nehemiah Massed follows d/t getting louder  . Hemorrhoids   . History of chickenpox   . Hypertension   . Incontinence in female   . Osteoarthritis   . Skin cancer 12/2017   BCC. nose removed from nose last week.  shoulder squamos cell removed previously  . Type O blood, Rh negative 12/2017   patient requests that this be present on her chart    Tobacco Use: Social History   Tobacco Use  Smoking Status Never Smoker  Smokeless Tobacco Never Used    Labs: Recent Review Flowsheet Data    Labs for ITP Cardiac and Pulmonary Rehab Latest Ref Rng & Units 09/01/2016 07/01/2017 11/18/2018 12/28/2018 01/11/2019   Cholestrol 100 - 199 mg/dL 171 167  163 - 112   LDLCALC 0 - 99 mg/dL 66 61 61 - 28   HDL >39 mg/dL 74 66 77 - 61   Trlycerides 0 - 149 mg/dL 157(H) 199(H) 127 - 113   Hemoglobin A1c 4.8 - 5.6 % - - - 5.8(H) -       Exercise Target Goals: Exercise Program Goal: Individual exercise prescription set using results from initial 6 min walk test and THRR while considering  patient's activity barriers and safety.   Exercise Prescription Goal: Initial exercise prescription builds to 30-45 minutes a day of aerobic activity, 2-3 days per week.  Home exercise guidelines will be given to patient during program as part of exercise prescription that the participant will acknowledge.  Activity Barriers & Risk  Stratification: Activity Barriers & Cardiac Risk Stratification - 01/02/19 1553      Activity Barriers & Cardiac Risk Stratification   Activity Barriers  Arthritis    Cardiac Risk Stratification  Moderate       6 Minute Walk: 6 Minute Walk    Row Name 01/05/19 1432         6 Minute Walk   Phase  Initial     Distance  263 feet     Walk Time  2 minutes     MPH  1.49     METS  0.31     RPE  13     Perceived Dyspnea   3     VO2 Peak  1.1     Symptoms  Yes (comment)     Comments  Patient stopped due to shortness of breath which she reports is increased when wearing a mask. SpO2 90% increased to 96%. Patient did not wish to resume test until after time had expired.     Resting HR  79 bpm     Resting BP  132/80     Exercise Oxygen Saturation  during 6 min walk  90 %     Max Ex. HR  84 bpm     Max Ex. BP  146/80     2 Minute Post BP  122/68        Oxygen Initial Assessment:   Oxygen Re-Evaluation:   Oxygen Discharge (Final Oxygen Re-Evaluation):   Initial Exercise Prescription: Initial Exercise Prescription - 01/05/19 1400      Date of Initial Exercise RX and Referring Provider   Date  01/05/19    Referring Provider  Nehemiah Massed      Treadmill   MPH  1    Grade  0    Minutes  15    METs  0.5      Recumbant Bike   Level  1    RPM  50    Watts  25    Minutes  15    METs  1      NuStep   Level  1    SPM  50    Minutes  15    METs  1      Arm Ergometer   Level  1    Watts  10    RPM  10    Minutes  15    METs  1      Biostep-RELP   Level  1    SPM  30    Minutes  15    METs  1      Prescription Details   Frequency (times per week)  3-5    Duration  Progress to 30 minutes of continuous aerobic without signs/symptoms of physical distress      Intensity   THRR 40-80% of Max Heartrate  103-127    Ratings of Perceived Exertion  11-13    Perceived Dyspnea  0-4      Progression   Progression  Continue progressive overload as per policy without  signs/symptoms or physical distress.      Resistance Training   Training Prescription  Yes    Weight  2    Reps  10-15       Perform Capillary Blood Glucose checks as needed.  Exercise Prescription Changes: Exercise Prescription Changes    Row Name 01/16/19 1100             Response to Exercise   Blood Pressure (Admit)  130/70       Blood Pressure (Exercise)  124/70       Blood Pressure (Exit)  126/70       Heart Rate (Admit)  86 bpm       Heart Rate (Exercise)  108 bpm       Heart Rate (Exit)  93 bpm       Rating of Perceived Exertion (Exercise)  11       Symptoms  none       Duration  Continue with 30 min of aerobic exercise without signs/symptoms of physical distress.       Intensity  THRR unchanged         Progression   Progression  Continue to progress workloads to maintain intensity without signs/symptoms of physical distress.       Average METs  1.53         Resistance Training   Training Prescription  Yes       Weight  3 lbs       Reps  10-15         Interval Training   Interval Training  No         Treadmill   MPH  1       Grade  0       Minutes  15       METs  1.77         NuStep   Level  1       Minutes  15       METs  1.3          Exercise Comments:   Exercise Goals and Review: Exercise Goals    Row Name 01/05/19 1441             Exercise Goals   Increase Physical Activity  Yes       Intervention  Provide advice, education, support and counseling about physical activity/exercise needs.;Develop an individualized exercise prescription for aerobic and resistive training based on initial evaluation findings, risk stratification, comorbidities and participant's personal goals.       Expected Outcomes  Short Term: Attend rehab on a regular basis to increase amount of physical activity.;Long Term: Add in home exercise to make exercise part of routine and to increase amount of physical activity.;Long Term: Exercising regularly at least 3-5  days a week.       Increase Strength and Stamina  Yes       Intervention  Provide advice, education, support and counseling about physical activity/exercise needs.;Develop an individualized exercise prescription for aerobic and resistive training based on initial evaluation findings, risk stratification, comorbidities and participant's personal goals.  Expected Outcomes  Short Term: Increase workloads from initial exercise prescription for resistance, speed, and METs.;Short Term: Perform resistance training exercises routinely during rehab and add in resistance training at home;Long Term: Improve cardiorespiratory fitness, muscular endurance and strength as measured by increased METs and functional capacity (6MWT)       Able to understand and use rate of perceived exertion (RPE) scale  Yes       Intervention  Provide education and explanation on how to use RPE scale       Expected Outcomes  Short Term: Able to use RPE daily in rehab to express subjective intensity level;Long Term:  Able to use RPE to guide intensity level when exercising independently       Intervention  Provide education and explanation on how to use Dyspnea scale       Expected Outcomes  Short Term: Able to use Dyspnea scale daily in rehab to express subjective sense of shortness of breath during exertion;Long Term: Able to use Dyspnea scale to guide intensity level when exercising independently       Intervention  Provide education, explanation, and written materials on patient's individual exercise prescription          Exercise Goals Re-Evaluation : Exercise Goals Re-Evaluation    [place] Name 01/09/19 1440 01/16/19 1149           Exercise Goal Re-Evaluation   Exercise Goals Review  Able to understand and use rate of perceived exertion (RPE) scale;Increase Physical Activity;Knowledge and understanding of Target Heart Rate Range (THRR);Understanding of Exercise Prescription;Increase Strength and Stamina;Able to check pulse  independently  Increase Physical Activity;Increase Strength and Stamina;Understanding of Exercise Prescription      Comments  Reviewed RPE scale, THR and program prescription with pt today.  Pt voiced understanding and was given a copy of goals to take home.  Jacqlyn Larsen is off to a good start in rehab.  She has completed two full days of exercise.  We will continue to monitor her progress.      Expected Outcomes  Short: Use RPE daily to regulate intensity. Long: Follow program prescription in THR.  Short: Continue to attend regularly Long: Continue to follow program prescription.         Discharge Exercise Prescription (Final Exercise Prescription Changes): Exercise Prescription Changes - 01/16/19 1100      Response to Exercise   Blood Pressure (Admit)  130/70    Blood Pressure (Exercise)  124/70    Blood Pressure (Exit)  126/70    Heart Rate (Admit)  86 bpm    Heart Rate (Exercise)  108 bpm    Heart Rate (Exit)  93 bpm    Rating of Perceived Exertion (Exercise)  11    Symptoms  none    Duration  Continue with 30 min of aerobic exercise without signs/symptoms of physical distress.    Intensity  THRR unchanged      Progression   Progression  Continue to progress workloads to maintain intensity without signs/symptoms of physical distress.    Average METs  1.53      Resistance Training   Training Prescription  Yes    Weight  3 lbs    Reps  10-15      Interval Training   Interval Training  No      Treadmill   MPH  1    Grade  0    Minutes  15    METs  1.77      NuStep  Level  1    Minutes  15    METs  1.3       Nutrition:  Target Goals: Understanding of nutrition guidelines, daily intake of sodium <1573m, cholesterol <2042m calories 30% from fat and 7% or less from saturated fats, daily to have 5 or more servings of fruits and vegetables.  Biometrics: Pre Biometrics - 01/05/19 1436      Pre Biometrics   Height  '5\' 2"'  (1.575 m)    Weight  146 lb (66.2 kg)    BMI  (Calculated)  26.7    Single Leg Stand  2.65 seconds        Nutrition Therapy Plan and Nutrition Goals: Nutrition Therapy & Goals - 01/05/19 1328      Nutrition Therapy   Diet  Low Na, HH diet    Protein (specify units)  55g    Fiber  25 grams    Whole Grain Foods  3 servings    Saturated Fats  12 max. grams    Fruits and Vegetables  5 servings/day    Sodium  1.5 grams      Personal Nutrition Goals   Nutrition Goal  ST: wants to think over everything LT: eat healthier overall    Comments  Pt B: black coffee, oatmeal, toast and smart balance. L: sandwich (white wheat) - tomato with mayo, salt/pepper. D: meat/chicken, vegetables, potatoes. (will switch between brown and white rice), whole wheat pasta, does not like whole wheat bread (suggested bread brands I like to try). Pt will have 1 diet pepsi and snack on chips and regular popcorn (talked about switching to poppers instead of chips (pt asked). Pt will have small ice cream or dark chocolate kisses. Pt drinks almond milk (dairy like feta can bother her), pt will use 1% milk in cooking, and will sometimes have whole eggs as part of her meal. Pt also reports sice quarentine going to [employer] 3x per week for a meal. Discussed HH eating and some meal ideas (pt doesn't know about what to do for lunch).      Intervention Plan   Intervention  Prescribe, educate and counsel regarding individualized specific dietary modifications aiming towards targeted core components such as weight, hypertension, lipid management, diabetes, heart failure and other comorbidities.;Nutrition handout(s) given to patient.    Expected Outcomes  Short Term Goal: Understand basic principles of dietary content, such as calories, fat, sodium, cholesterol and nutrients.;Short Term Goal: A plan has been developed with personal nutrition goals set during dietitian appointment.;Long Term Goal: Adherence to prescribed nutrition plan.       Nutrition  Assessments: Nutrition Assessments - 01/05/19 1506      MEDFICTS Scores   Pre Score  28       Nutrition Goals Re-Evaluation:   Nutrition Goals Discharge (Final Nutrition Goals Re-Evaluation):   Psychosocial: Target Goals: Acknowledge presence or absence of significant depression and/or stress, maximize coping skills, provide positive support system. Participant is able to verbalize types and ability to use techniques and skills needed for reducing stress and depression.   Initial Review & Psychosocial Screening: Initial Psych Review & Screening - 01/02/19 1554      Initial Review   Current issues with  Current Sleep Concerns;Current Stress Concerns    Comments  BeJacqlyn Larsens having issues sleeping since her MI, she is hoping it will get better in the next week. She is handling the MI well and has a good support system, but this has definitely been a  change in their everyday life.      Family Dynamics   Good Support System?  Yes      Barriers   Psychosocial barriers to participate in program  There are no identifiable barriers or psychosocial needs.      Screening Interventions   Interventions  Encouraged to exercise    Expected Outcomes  Short Term goal: Utilizing psychosocial counselor, staff and physician to assist with identification of specific Stressors or current issues interfering with healing process. Setting desired goal for each stressor or current issue identified.;Long Term Goal: Stressors or current issues are controlled or eliminated.;Short Term goal: Identification and review with participant of any Quality of Life or Depression concerns found by scoring the questionnaire.;Long Term goal: The participant improves quality of Life and PHQ9 Scores as seen by post scores and/or verbalization of changes       Quality of Life Scores:  Quality of Life - 01/05/19 1357      Quality of Life   Select  Quality of Life      Quality of Life Scores   Health/Function Pre  18.83 %     Socioeconomic Pre  28.38 %    Psych/Spiritual Pre  30 %    Family Pre  27.6 %    GLOBAL Pre  24.5 %      Scores of 19 and below usually indicate a poorer quality of life in these areas.  A difference of  2-3 points is a clinically meaningful difference.  A difference of 2-3 points in the total score of the Quality of Life Index has been associated with significant improvement in overall quality of life, self-image, physical symptoms, and general health in studies assessing change in quality of life.  PHQ-9: Recent Review Flowsheet Data    Depression screen [facility] 2/9 01/05/2019 04/12/2018 02/23/2017 02/19/2016 08/14/2015   Decreased Interest 1 0 0 0 0   Down, Depressed, Hopeless - 0 0 0 0   PHQ - 2 Score 1 0 0 0 0   Altered sleeping 0 - - - -   Tired, decreased energy 1 - - - -   Change in appetite 1 - - - -   Feeling bad or failure about yourself  0 - - - -   Trouble concentrating 1 - - - -   Moving slowly or fidgety/restless 0 - - - -   Suicidal thoughts 0 - - - -   PHQ-9 Score 4 - - - -   Difficult doing work/chores Not difficult at all - - - -     Interpretation of Total Score  Total Score Depression Severity:  1-4 = Minimal depression, 5-9 = Mild depression, 10-14 = Moderate depression, 15-19 = Moderately severe depression, 20-27 = Severe depression   Psychosocial Evaluation and Intervention:   Psychosocial Re-Evaluation:   Psychosocial Discharge (Final Psychosocial Re-Evaluation):   Vocational Rehabilitation: Provide vocational rehab assistance to qualifying candidates.   Vocational Rehab Evaluation & Intervention:   Education: Education Goals: Education classes will be provided on a variety of topics geared toward better understanding of heart health and risk factor modification. Participant will state understanding/return demonstration of topics presented as noted by education test scores.  Learning Barriers/Preferences: Learning Barriers/Preferences - 01/02/19 1550       Learning Barriers/Preferences   Learning Barriers  Sight   macular degeneration   Learning Preferences  None       Education Topics:  AED/CPR: - Group verbal and written instruction with  the use of models to demonstrate the basic use of the AED with the basic ABC's of resuscitation.   General Nutrition Guidelines/Fats and Fiber: -Group instruction provided by verbal, written material, models and posters to present the general guidelines for heart healthy nutrition. Gives an explanation and review of dietary fats and fiber.   Controlling Sodium/Reading Food Labels: -Group verbal and written material supporting the discussion of sodium use in heart healthy nutrition. Review and explanation with models, verbal and written materials for utilization of the food label.   Exercise Physiology & General Exercise Guidelines: - Group verbal and written instruction with models to review the exercise physiology of the cardiovascular system and associated critical values. Provides general exercise guidelines with specific guidelines to those with heart or lung disease.    Aerobic Exercise & Resistance Training: - Gives group verbal and written instruction on the various components of exercise. Focuses on aerobic and resistive training programs and the benefits of this training and how to safely progress through these programs..   Flexibility, Balance, Mind/Body Relaxation: Provides group verbal/written instruction on the benefits of flexibility and balance training, including mind/body exercise modes such as yoga, pilates and tai chi.  Demonstration and skill practice provided.   Stress and Anxiety: - Provides group verbal and written instruction about the health risks of elevated stress and causes of high stress.  Discuss the correlation between heart/lung disease and anxiety and treatment options. Review healthy ways to manage with stress and anxiety.   Depression: - Provides group  verbal and written instruction on the correlation between heart/lung disease and depressed mood, treatment options, and the stigmas associated with seeking treatment.   Anatomy & Physiology of the Heart: - Group verbal and written instruction and models provide basic cardiac anatomy and physiology, with the coronary electrical and arterial systems. Review of Valvular disease and Heart Failure   Cardiac Procedures: - Group verbal and written instruction to review commonly prescribed medications for heart disease. Reviews the medication, class of the drug, and side effects. Includes the steps to properly store meds and maintain the prescription regimen. (beta blockers and nitrates)   Cardiac Medications I: - Group verbal and written instruction to review commonly prescribed medications for heart disease. Reviews the medication, class of the drug, and side effects. Includes the steps to properly store meds and maintain the prescription regimen.   Cardiac Medications II: -Group verbal and written instruction to review commonly prescribed medications for heart disease. Reviews the medication, class of the drug, and side effects. (all other drug classes)    Go Sex-Intimacy & Heart Disease, Get SMART - Goal Setting: - Group verbal and written instruction through game format to discuss heart disease and the return to sexual intimacy. Provides group verbal and written material to discuss and apply goal setting through the application of the S.M.A.R.T. Method.   Other Matters of the Heart: - Provides group verbal, written materials and models to describe Stable Angina and Peripheral Artery. Includes description of the disease process and treatment options available to the cardiac patient.   Exercise & Equipment Safety: - Individual verbal instruction and demonstration of equipment use and safety with use of the equipment.   Cardiac Rehab from 01/05/2019 in [facility] Cardiac and Pulmonary Rehab  Date   01/05/19  Educator  [place]  Instruction Review Code  1- Verbalizes Understanding      Infection Prevention: - Provides verbal and written material to individual with discussion of infection control including proper hand washing and  proper equipment cleaning during exercise session.   Cardiac Rehab from 01/05/2019 in [facility] Cardiac and Pulmonary Rehab  Date  01/05/19  Educator  [place]  Instruction Review Code  1- Verbalizes Understanding      Falls Prevention: - Provides verbal and written material to individual with discussion of falls prevention and safety.   Cardiac Rehab from 01/05/2019 in [facility] Cardiac and Pulmonary Rehab  Date  01/05/19  Educator  [place]  Instruction Review Code  1- Verbalizes Understanding      Diabetes: - Individual verbal and written instruction to review signs/symptoms of diabetes, desired ranges of glucose level fasting, after meals and with exercise. Acknowledge that pre and post exercise glucose checks will be done for 3 sessions at entry of program.   Know Your Numbers and Risk Factors: -Group verbal and written instruction about important numbers in your health.  Discussion of what are risk factors and how they play a role in the disease process.  Review of Cholesterol, Blood Pressure, Diabetes, and BMI and the role they play in your overall health.   Sleep Hygiene: -Provides group verbal and written instruction about how sleep can affect your health.  Define sleep hygiene, discuss sleep cycles and impact of sleep habits. Review good sleep hygiene tips.    Other: -Provides group and verbal instruction on various topics (see comments)   Knowledge Questionnaire Score: Knowledge Questionnaire Score - 01/05/19 1357      Knowledge Questionnaire Score   Pre Score  25/26 (Exercise question missed)  reviewed correst response with Wells Guiles today. She verbalized understanding of the response.       Core Components/Risk Factors/Patient Goals at Admission: Personal  Goals and Risk Factors at Admission - 01/02/19 1549      Core Components/Risk Factors/Patient Goals on Admission   Hypertension  Yes    Intervention  Provide education on lifestyle modifcations including regular physical activity/exercise, weight management, moderate sodium restriction and increased consumption of fresh fruit, vegetables, and low fat dairy, alcohol moderation, and smoking cessation.;Monitor prescription use compliance.    Expected Outcomes  Short Term: Continued assessment and intervention until BP is < 140/64m HG in hypertensive participants. < 130/865mHG in hypertensive participants with diabetes, heart failure or chronic kidney disease.;Long Term: Maintenance of blood pressure at goal levels.    Lipids  Yes    Intervention  Provide education and support for participant on nutrition & aerobic/resistive exercise along with prescribed medications to achieve LDL <7068mHDL >65m50m  Expected Outcomes  Short Term: Participant states understanding of desired cholesterol values and is compliant with medications prescribed. Participant is following exercise prescription and nutrition guidelines.;Long Term: Cholesterol controlled with medications as prescribed, with individualized exercise RX and with personalized nutrition plan. Value goals: LDL < 70mg46mL > 40 mg.       Core Components/Risk Factors/Patient Goals Review:    Core Components/Risk Factors/Patient Goals at Discharge (Final Review):    ITP Comments: ITP Comments    Row Name 01/02/19 1556 01/09/19 1440 01/25/19 0626       ITP Comments  Virtual orientation completed. Diagnosis [facility]. EP/RD orientation scheduled for 7/23 at pm  First full day of exercise!  Patient was oriented to gym and equipment including functions, settings, policies, and procedures.  Patient's individual exercise prescription and treatment plan were reviewed.  All starting workloads were established based on the results of the 6  minute walk test done at initial orientation visit.  The plan  for exercise progression was also introduced and progression will be customized based on patient's performance and goals.  30 Day Review Completed today. Continue with ITP unless changed by Medical Director review.  New to program        Comments:

## 2019-01-26 ENCOUNTER — Encounter: Payer: Medicare Other | Admitting: *Deleted

## 2019-01-26 DIAGNOSIS — I213 ST elevation (STEMI) myocardial infarction of unspecified site: Secondary | ICD-10-CM

## 2019-01-26 NOTE — Progress Notes (Signed)
Daily Session Note  Patient Details  Name: Teresa Moyer MRN: [id number] Date of Birth: 03-29-1936 Referring Provider:     Cardiac Rehab from 01/05/2019 in [facility]  Referring Provider  Nehemiah Massed      Encounter Date: 01/26/2019  Check In: Session Check In - 01/26/19 1427      Check-In   Supervising physician immediately available to respond to emergencies  See telemetry face sheet for immediately available ER MD    Location  [facility]-Cardiac & Pulmonary Rehab    Staff Present  Renita Papa, RN BSN;Jessica Luan Pulling, MA, RCEP, CCRP, CCET;Joseph Callisburg RCP,RRT,BSRT    Virtual Visit  No    Medication changes reported      No    Fall or balance concerns reported     No    Warm-up and Cool-down  Performed on first and last piece of equipment    Resistance Training Performed  Yes    VAD Patient?  No    PAD/SET Patient?  No      Pain Assessment   Currently in Pain?  No/denies          Social History   Tobacco Use  Smoking Status Never Smoker  Smokeless Tobacco Never Used    Goals Met:  Independence with exercise equipment Exercise tolerated well No report of cardiac concerns or symptoms Strength training completed today  Goals Unmet:  Not Applicable  Comments: Pt able to follow exercise prescription today without complaint.  Will continue to monitor for progression.    Dr. Emily Filbert is Medical Director for [name].

## 2019-01-30 ENCOUNTER — Other Ambulatory Visit: Payer: Self-pay

## 2019-01-30 ENCOUNTER — Encounter: Payer: Self-pay | Admitting: Emergency Medicine

## 2019-01-30 ENCOUNTER — Inpatient Hospital Stay
Admission: EM | Admit: 2019-01-30 | Discharge: 2019-02-01 | DRG: 291 | Disposition: A | Discharge status: Home / Self Care | Payer: Medicare Other | Attending: Internal Medicine | Admitting: Internal Medicine

## 2019-01-30 ENCOUNTER — Encounter: Payer: Medicare Other | Admitting: *Deleted

## 2019-01-30 ENCOUNTER — Emergency Department: Payer: Medicare Other

## 2019-01-30 DIAGNOSIS — L89159 Pressure ulcer of sacral region, unspecified stage: Secondary | ICD-10-CM | POA: Diagnosis present

## 2019-01-30 DIAGNOSIS — Z955 Presence of coronary angioplasty implant and graft: Secondary | ICD-10-CM | POA: Diagnosis not present

## 2019-01-30 DIAGNOSIS — Z66 Do not resuscitate: Secondary | ICD-10-CM | POA: Diagnosis not present

## 2019-01-30 DIAGNOSIS — I10 Essential (primary) hypertension: Secondary | ICD-10-CM | POA: Diagnosis not present

## 2019-01-30 DIAGNOSIS — E1165 Type 2 diabetes mellitus with hyperglycemia: Secondary | ICD-10-CM | POA: Diagnosis present

## 2019-01-30 DIAGNOSIS — Z885 Allergy status to narcotic agent status: Secondary | ICD-10-CM

## 2019-01-30 DIAGNOSIS — Z8249 Family history of ischemic heart disease and other diseases of the circulatory system: Secondary | ICD-10-CM

## 2019-01-30 DIAGNOSIS — I5023 Acute on chronic systolic (congestive) heart failure: Secondary | ICD-10-CM | POA: Diagnosis not present

## 2019-01-30 DIAGNOSIS — I248 Other forms of acute ischemic heart disease: Secondary | ICD-10-CM | POA: Diagnosis present

## 2019-01-30 DIAGNOSIS — T502X5A Adverse effect of carbonic-anhydrase inhibitors, benzothiadiazides and other diuretics, initial encounter: Secondary | ICD-10-CM | POA: Diagnosis present

## 2019-01-30 DIAGNOSIS — Z803 Family history of malignant neoplasm of breast: Secondary | ICD-10-CM

## 2019-01-30 DIAGNOSIS — E876 Hypokalemia: Secondary | ICD-10-CM | POA: Diagnosis present

## 2019-01-30 DIAGNOSIS — I4891 Unspecified atrial fibrillation: Secondary | ICD-10-CM | POA: Diagnosis present

## 2019-01-30 DIAGNOSIS — Z7902 Long term (current) use of antithrombotics/antiplatelets: Secondary | ICD-10-CM | POA: Diagnosis not present

## 2019-01-30 DIAGNOSIS — I110 Hypertensive heart disease with heart failure: Principal | ICD-10-CM | POA: Diagnosis present

## 2019-01-30 DIAGNOSIS — I255 Ischemic cardiomyopathy: Secondary | ICD-10-CM | POA: Diagnosis present

## 2019-01-30 DIAGNOSIS — Z9049 Acquired absence of other specified parts of digestive tract: Secondary | ICD-10-CM

## 2019-01-30 DIAGNOSIS — I509 Heart failure, unspecified: Secondary | ICD-10-CM

## 2019-01-30 DIAGNOSIS — I213 ST elevation (STEMI) myocardial infarction of unspecified site: Secondary | ICD-10-CM

## 2019-01-30 DIAGNOSIS — R4182 Altered mental status, unspecified: Secondary | ICD-10-CM | POA: Diagnosis not present

## 2019-01-30 DIAGNOSIS — R0602 Shortness of breath: Secondary | ICD-10-CM | POA: Diagnosis not present

## 2019-01-30 DIAGNOSIS — Z7982 Long term (current) use of aspirin: Secondary | ICD-10-CM

## 2019-01-30 DIAGNOSIS — Z20828 Contact with and (suspected) exposure to other viral communicable diseases: Secondary | ICD-10-CM | POA: Diagnosis present

## 2019-01-30 DIAGNOSIS — I252 Old myocardial infarction: Secondary | ICD-10-CM

## 2019-01-30 DIAGNOSIS — Z8619 Personal history of other infectious and parasitic diseases: Secondary | ICD-10-CM | POA: Diagnosis not present

## 2019-01-30 DIAGNOSIS — M1990 Unspecified osteoarthritis, unspecified site: Secondary | ICD-10-CM | POA: Diagnosis present

## 2019-01-30 DIAGNOSIS — J9601 Acute respiratory failure with hypoxia: Secondary | ICD-10-CM | POA: Diagnosis present

## 2019-01-30 DIAGNOSIS — Z888 Allergy status to other drugs, medicaments and biological substances status: Secondary | ICD-10-CM

## 2019-01-30 DIAGNOSIS — Z90710 Acquired absence of both cervix and uterus: Secondary | ICD-10-CM

## 2019-01-30 DIAGNOSIS — E785 Hyperlipidemia, unspecified: Secondary | ICD-10-CM | POA: Diagnosis present

## 2019-01-30 DIAGNOSIS — Z801 Family history of malignant neoplasm of trachea, bronchus and lung: Secondary | ICD-10-CM

## 2019-01-30 DIAGNOSIS — Z882 Allergy status to sulfonamides status: Secondary | ICD-10-CM

## 2019-01-30 DIAGNOSIS — Z79899 Other long term (current) drug therapy: Secondary | ICD-10-CM

## 2019-01-30 DIAGNOSIS — Z9841 Cataract extraction status, right eye: Secondary | ICD-10-CM

## 2019-01-30 DIAGNOSIS — K219 Gastro-esophageal reflux disease without esophagitis: Secondary | ICD-10-CM | POA: Diagnosis present

## 2019-01-30 DIAGNOSIS — Z96651 Presence of right artificial knee joint: Secondary | ICD-10-CM | POA: Diagnosis present

## 2019-01-30 DIAGNOSIS — Z85828 Personal history of other malignant neoplasm of skin: Secondary | ICD-10-CM

## 2019-01-30 DIAGNOSIS — L0390 Cellulitis, unspecified: Secondary | ICD-10-CM | POA: Diagnosis present

## 2019-01-30 DIAGNOSIS — Z880 Allergy status to penicillin: Secondary | ICD-10-CM

## 2019-01-30 DIAGNOSIS — R32 Unspecified urinary incontinence: Secondary | ICD-10-CM | POA: Diagnosis present

## 2019-01-30 DIAGNOSIS — Z9842 Cataract extraction status, left eye: Secondary | ICD-10-CM | POA: Diagnosis not present

## 2019-01-30 DIAGNOSIS — R7989 Other specified abnormal findings of blood chemistry: Secondary | ICD-10-CM | POA: Diagnosis not present

## 2019-01-30 LAB — COMPREHENSIVE METABOLIC PANEL
ALT: 25 U/L (ref 0–44)
AST: 27 U/L (ref 15–41)
Albumin: 4.1 g/dL (ref 3.5–5.0)
Alkaline Phosphatase: 175 U/L — ABNORMAL HIGH (ref 38–126)
Anion gap: 9 (ref 5–15)
BUN: 13 mg/dL (ref 8–23)
CO2: 22 mmol/L (ref 22–32)
Calcium: 9.3 mg/dL (ref 8.9–10.3)
Chloride: 103 mmol/L (ref 98–111)
Creatinine, Ser: 0.59 mg/dL (ref 0.44–1.00)
GFR calc Af Amer: >60 mL/min (ref >60)
GFR calc non Af Amer: >60 mL/min (ref >60)
Glucose, Bld: 109 mg/dL — ABNORMAL HIGH (ref 70–99)
Potassium: 3.8 mmol/L (ref 3.5–5.1)
Sodium: 134 mmol/L — ABNORMAL LOW (ref 135–145)
Total Bilirubin: 1.3 mg/dL — ABNORMAL HIGH (ref 0.3–1.2)
Total Protein: 7.5 g/dL (ref 6.5–8.1)

## 2019-01-30 LAB — CBC WITH DIFFERENTIAL/PLATELET
Abs Immature Granulocytes: 0.01 10*3/uL (ref 0.00–0.07)
Basophils Absolute: 0.1 10*3/uL (ref 0.0–0.1)
Basophils Relative: 1 %
Eosinophils Absolute: 0.1 10*3/uL (ref 0.0–0.5)
Eosinophils Relative: 1 %
HCT: 40.1 % (ref 36.0–46.0)
Hemoglobin: 13.3 g/dL (ref 12.0–15.0)
Immature Granulocytes: 0 %
Lymphocytes Relative: 20 %
Lymphs Abs: 1.6 10*3/uL (ref 0.7–4.0)
MCH: 30.1 pg (ref 26.0–34.0)
MCHC: 33.2 g/dL (ref 30.0–36.0)
MCV: 90.7 fL (ref 80.0–100.0)
Monocytes Absolute: 0.7 10*3/uL (ref 0.1–1.0)
Monocytes Relative: 9 %
Neutro Abs: 5.5 10*3/uL (ref 1.7–7.7)
Neutrophils Relative %: 69 %
Platelets: 293 10*3/uL (ref 150–400)
RBC: 4.42 MIL/uL (ref 3.87–5.11)
RDW: 13.4 % (ref 11.5–15.5)
WBC: 8 10*3/uL (ref 4.0–10.5)
nRBC: 0 % (ref 0.0–0.2)

## 2019-01-30 LAB — BRAIN NATRIURETIC PEPTIDE: B Natriuretic Peptide: 1930 pg/mL — ABNORMAL HIGH (ref 0.0–100.0)

## 2019-01-30 LAB — SARS CORONAVIRUS 2 BY RT PCR (HOSPITAL ORDER, PERFORMED IN ~~LOC~~ HOSPITAL LAB): SARS Coronavirus 2: NEGATIVE

## 2019-01-30 LAB — TROPONIN I (HIGH SENSITIVITY)
Troponin I (High Sensitivity): 42 ng/L — ABNORMAL HIGH (ref <18)
Troponin I (High Sensitivity): 240 ng/L (ref <18)
Troponin I (High Sensitivity): 259 ng/L (ref <18)

## 2019-01-30 MED ORDER — LABETALOL HCL 5 MG/ML IV SOLN
10.0000 mg | Freq: Once | INTRAVENOUS | Status: AC
  Administered 2019-01-30: 10 mg via INTRAVENOUS
  Filled 2019-01-30: qty 4

## 2019-01-30 MED ORDER — LORATADINE 10 MG PO TABS
10.0000 mg | ORAL_TABLET | Freq: Every day | ORAL | Status: DC
  Administered 2019-01-30 – 2019-02-01 (×2): 10 mg via ORAL
  Filled 2019-01-30 (×3): qty 1

## 2019-01-30 MED ORDER — POLYVINYL ALCOHOL 1.4 % OP SOLN
1.0000 [drp] | Freq: Three times a day (TID) | OPHTHALMIC | Status: DC | PRN
  Filled 2019-01-30: qty 15

## 2019-01-30 MED ORDER — ACETAMINOPHEN 325 MG PO TABS
650.0000 mg | ORAL_TABLET | ORAL | Status: DC | PRN
  Administered 2019-01-30 – 2019-02-01 (×3): 650 mg via ORAL
  Filled 2019-01-30 (×3): qty 2

## 2019-01-30 MED ORDER — CLOPIDOGREL BISULFATE 75 MG PO TABS
75.0000 mg | ORAL_TABLET | Freq: Every day | ORAL | Status: DC
  Administered 2019-01-31: 75 mg via ORAL
  Filled 2019-01-30 (×2): qty 1

## 2019-01-30 MED ORDER — ENOXAPARIN SODIUM 40 MG/0.4ML ~~LOC~~ SOLN
40.0000 mg | SUBCUTANEOUS | Status: DC
  Administered 2019-01-30 – 2019-01-31 (×2): 40 mg via SUBCUTANEOUS
  Filled 2019-01-30 (×2): qty 0.4

## 2019-01-30 MED ORDER — AZELASTINE HCL 0.1 % NA SOLN
1.0000 | Freq: Every day | NASAL | Status: DC
  Administered 2019-01-30 – 2019-01-31 (×2): 1 via NASAL
  Filled 2019-01-30: qty 30

## 2019-01-30 MED ORDER — FUROSEMIDE 10 MG/ML IJ SOLN
40.0000 mg | Freq: Every day | INTRAMUSCULAR | Status: DC
  Administered 2019-01-31 – 2019-02-01 (×2): 40 mg via INTRAVENOUS
  Filled 2019-01-30 (×2): qty 4

## 2019-01-30 MED ORDER — IRBESARTAN 150 MG PO TABS
150.0000 mg | ORAL_TABLET | Freq: Every day | ORAL | Status: DC
  Administered 2019-01-31 – 2019-02-01 (×2): 150 mg via ORAL
  Filled 2019-01-30 (×2): qty 1

## 2019-01-30 MED ORDER — ASPIRIN 81 MG PO CHEW
81.0000 mg | CHEWABLE_TABLET | Freq: Every day | ORAL | Status: DC
  Administered 2019-01-31 – 2019-02-01 (×2): 81 mg via ORAL
  Filled 2019-01-30 (×2): qty 1

## 2019-01-30 MED ORDER — PANTOPRAZOLE SODIUM 40 MG PO TBEC
40.0000 mg | DELAYED_RELEASE_TABLET | Freq: Every day | ORAL | Status: DC
  Administered 2019-02-01: 40 mg via ORAL
  Filled 2019-01-30 (×2): qty 1

## 2019-01-30 MED ORDER — ATORVASTATIN CALCIUM 20 MG PO TABS
40.0000 mg | ORAL_TABLET | Freq: Every day | ORAL | Status: DC
  Administered 2019-01-31: 40 mg via ORAL
  Filled 2019-01-30: qty 2

## 2019-01-30 MED ORDER — ARTIFICIAL TEARS OPHTHALMIC OINT
1.0000 "application " | TOPICAL_OINTMENT | Freq: Every evening | OPHTHALMIC | Status: DC | PRN
  Filled 2019-01-30: qty 3.5

## 2019-01-30 MED ORDER — SODIUM CHLORIDE 0.9 % IV SOLN: 250.0000 mL | INTRAVENOUS | Status: DC | PRN

## 2019-01-30 MED ORDER — CARVEDILOL 6.25 MG PO TABS
6.2500 mg | ORAL_TABLET | Freq: Two times a day (BID) | ORAL | Status: DC
  Administered 2019-01-31 – 2019-02-01 (×3): 6.25 mg via ORAL
  Filled 2019-01-30 (×3): qty 1

## 2019-01-30 MED ORDER — SODIUM CHLORIDE 0.9% FLUSH
3.0000 mL | Freq: Two times a day (BID) | INTRAVENOUS | Status: DC
  Administered 2019-01-30 – 2019-02-01 (×4): 3 mL via INTRAVENOUS

## 2019-01-30 MED ORDER — FLUTICASONE PROPIONATE 50 MCG/ACT NA SUSP
1.0000 | Freq: Every day | NASAL | Status: DC
  Administered 2019-01-31 – 2019-02-01 (×2): 1 via NASAL
  Filled 2019-01-30: qty 16

## 2019-01-30 MED ORDER — SODIUM CHLORIDE 0.9% FLUSH: 3.0000 mL | INTRAVENOUS | Status: DC | PRN

## 2019-01-30 MED ORDER — FUROSEMIDE 10 MG/ML IJ SOLN
40.0000 mg | Freq: Once | INTRAMUSCULAR | Status: AC
  Administered 2019-01-30: 40 mg via INTRAVENOUS
  Filled 2019-01-30: qty 4

## 2019-01-30 MED ORDER — ONDANSETRON HCL 4 MG/2ML IJ SOLN: 4.0000 mg | Freq: Four times a day (QID) | INTRAMUSCULAR | Status: DC | PRN

## 2019-01-30 NOTE — Progress Notes (Signed)
Family Meeting Note  Advance Directive:yes  Today a meeting took place with the Patient and spouse.  Patient is unable to participate.   The following clinical team members were present during this meeting:MD  The following were discussed:Patient's diagnosis: acute on chronic CHF, Patient's progosis: Unable to determine and Goals for treatment: DNI   Patient states she would not want to be intubated under any circumstances.  She is willing to undergo cardiac resuscitation.  Will make patient DNR.  Additional follow-up to be provided: prn  Time spent during discussion:20 minutes  Evette Doffing, MD

## 2019-01-30 NOTE — Progress Notes (Signed)
Paged Dr about patient [place] [address]. Will continue to monitor.

## 2019-01-30 NOTE — ED Notes (Signed)
Pt given sandwich tray and water 

## 2019-01-30 NOTE — ED Triage Notes (Addendum)
Here for [facility].  Pt had MI one month ago and was put on brilenta.  Started having [place] so was switched to plavix and sx improved but now having worsening SHOB with hypoxia.  + EKG changes.  Placed on 2 L [place] and sats increased to 91%.  Increased to 3 L [place], 93% [place] 3 L.   SHOB worse on exertion.
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
## 2019-01-30 NOTE — Progress Notes (Signed)
Daily Session Note  Patient Details  Name: Teresa Moyer MRN: [id number] Date of Birth: [birth year] Referring Provider:     Cardiac Rehab from 01/05/2019 in [facility] Cardiac and Pulmonary Rehab  Referring Provider  Teresa Moyer      Encounter Date: 01/30/2019  Check In: Session Check In - 01/30/19 1436      Check-In   Supervising physician immediately available to respond to emergencies  See telemetry face sheet for immediately available ER MD    Location  [facility]-Cardiac & Pulmonary Rehab    Staff Present  Teresa Lark, RN, BSN, Teresa Moyer, BS, ACSM CEP, Exercise Physiologist;Teresa Moyer BS, Exercise Physiologist    Virtual Visit  No    Medication changes reported      No    Fall or balance concerns reported     No    Warm-up and Cool-down  Performed on first and last piece of equipment    Resistance Training Performed  Yes    VAD Patient?  No    PAD/SET Patient?  No      Pain Assessment   Currently in Pain?  No/denies          Social History   Tobacco Use  Smoking Status Never Smoker  Smokeless Tobacco Never Used    Goals Met:  Independence with exercise equipment  Goals Unmet:  O2 Sat  Comments:Teresa Moyer's O2 Sat dropped to 85 with exercise today.  After resting it was 88 %. Called Dr Teresa Moyer to report this as it is not her norm.  No report of congestion or wheezing by [facility].  Taken to ED by wheelchair.  BP 190/100 at cool down.  Not her norm. Teresa Moyer's husband was here and went with her to the ED.   Dr. Emily Moyer is Medical Director for [name].

## 2019-01-31 LAB — BASIC METABOLIC PANEL
Anion gap: 9 (ref 5–15)
BUN: 11 mg/dL (ref 8–23)
CO2: 22 mmol/L (ref 22–32)
Calcium: 8.7 mg/dL — ABNORMAL LOW (ref 8.9–10.3)
Chloride: 103 mmol/L (ref 98–111)
Creatinine, Ser: 0.54 mg/dL (ref 0.44–1.00)
GFR calc Af Amer: >60 mL/min (ref >60)
GFR calc non Af Amer: >60 mL/min (ref >60)
Glucose, Bld: 96 mg/dL (ref 70–99)
Potassium: 3.1 mmol/L — ABNORMAL LOW (ref 3.5–5.1)
Sodium: 134 mmol/L — ABNORMAL LOW (ref 135–145)

## 2019-01-31 LAB — CBC WITH DIFFERENTIAL/PLATELET
Abs Immature Granulocytes: 0.02 10*3/uL (ref 0.00–0.07)
Basophils Absolute: 0.1 10*3/uL (ref 0.0–0.1)
Basophils Relative: 1 %
Eosinophils Absolute: 0.1 10*3/uL (ref 0.0–0.5)
Eosinophils Relative: 2 %
HCT: 35.4 % — ABNORMAL LOW (ref 36.0–46.0)
Hemoglobin: 11.9 g/dL — ABNORMAL LOW (ref 12.0–15.0)
Immature Granulocytes: 0 %
Lymphocytes Relative: 31 %
Lymphs Abs: 2.4 10*3/uL (ref 0.7–4.0)
MCH: 30.3 pg (ref 26.0–34.0)
MCHC: 33.6 g/dL (ref 30.0–36.0)
MCV: 90.1 fL (ref 80.0–100.0)
Monocytes Absolute: 0.8 10*3/uL (ref 0.1–1.0)
Monocytes Relative: 10 %
Neutro Abs: 4.4 10*3/uL (ref 1.7–7.7)
Neutrophils Relative %: 56 %
Platelets: 269 10*3/uL (ref 150–400)
RBC: 3.93 MIL/uL (ref 3.87–5.11)
RDW: 13.3 % (ref 11.5–15.5)
WBC: 7.8 10*3/uL (ref 4.0–10.5)
nRBC: 0 % (ref 0.0–0.2)

## 2019-01-31 NOTE — Evaluation (Signed)
Physical Therapy Evaluation Patient Details Name: Teresa Moyer MRN: [id number] DOB: 1936-05-17 Today's Date: 01/31/2019   History of Present Illness  83 y.o. female with a known history of hypertension, GERD, recent STEMI s/p PCI with DES to the mid LAD ~1 month ago who presented to the ED with shortness of breath.  Clinical Impression  Pt ultimately did well with PT exam ambulating ~250 ft  with consistent cadence, slightly stooped posture.  She did some of this w/o AD, but did better and showed improved safety with walker.  Pt on 3L on arrival, attempt at bed mobility/exercises on room air resulted in slow but consistent drop in O2 (low 90s), similar drop with prolonged bout of ambulation on 2L.  Pt is safe to go home and does not need HHPT, husband present and in agreement, however given cardiovascular situation would benefit from continued [facility] program.    Follow Up Recommendations No PT follow up(would benefit from continued HeartTrack program)    Equipment Recommendations  None recommended by PT    Recommendations for Other Services       Precautions / Restrictions Precautions Precautions: (moderate fall risk) Restrictions Weight Bearing Restrictions: No      Mobility  Bed Mobility Overal bed mobility: Independent                Transfers Overall transfer level: Modified independent               General transfer comment: stabilized w/o AD, but did quickly reach to surfaces for balance  Ambulation/Gait Ambulation/Gait assistance: Min guard Gait Distance (Feet): 250 Feet Assistive device: Rolling walker (2 wheeled)       General Gait Details: Pt was able to ambulate with minimally in room without AD, but much of the time reaching for surfaces to stabilize.  Pt with increased speed and confidence using walker and agreed that she did better with it.  Pt on 2 liters t/o effort with sats slowly decreasing down to 90% over the entire 250 ft, some  fatigue but not excessive SOB.  Pt was able to do ~25 ft w/o AD with slower but still safe ambulation.  Stairs            Wheelchair Mobility    Modified Rankin (Stroke Patients Only)       Balance                                             Pertinent Vitals/Pain Pain Assessment: (some chronic arthritic (hands, L knee, etc) pain)    Home Living Family/patient expects to be discharged to:: Private residence Living Arrangements: Spouse/significant other Available Help at Discharge: Family;Available 24 hours/day Type of Home: House Home Access: Stairs to enter Entrance Stairs-Rails: Can reach both Entrance Stairs-Number of Steps: 7   Home Equipment: Walker - 2 wheels;Cane - single point      Prior Function Level of Independence: Independent               Hand Dominance        Extremity/Trunk Assessment   Upper Extremity Assessment Upper Extremity Assessment: Overall WFL for tasks assessed    Lower Extremity Assessment Lower Extremity Assessment: Overall WFL for tasks assessed       Communication   Communication: No difficulties  Cognition Arousal/Alertness: Awake/alert Behavior During Therapy: WFL for tasks assessed/performed Overall Cognitive Status:  Within Functional Limits for tasks assessed                                        General Comments General comments (skin integrity, edema, etc.): O2 sats high 90s on 3L on arrival.  Attempted room air with bed mobility/strength testing, decreased to 91% in <5 minutes.  Quickly back to high 90s on 3L, dropped to 2L and she stayed mid 90s much of the time until with prolonged ambulation it eventually dropped to low 90s again.     Exercises     Assessment/Plan    PT Assessment Patient needs continued PT services  PT Problem List         PT Treatment Interventions DME instruction;Gait training;Stair training;Therapeutic activities;Therapeutic  exercise;Functional mobility training;Balance training;Patient/family education    PT Goals (Current goals can be found in the Care Plan section)  Acute Rehab PT Goals Patient Stated Goal: get back to more activity PT Goal Formulation: With patient Time For Goal Achievement: 02/14/19 Potential to Achieve Goals: Good    Frequency Min 2X/week   Barriers to discharge        Co-evaluation               AM-PAC PT "6 Clicks" Mobility  Outcome Measure Help needed turning from your back to your side while in a flat bed without using bedrails?: None Help needed moving from lying on your back to sitting on the side of a flat bed without using bedrails?: None Help needed moving to and from a bed to a chair (including a wheelchair)?: None Help needed standing up from a chair using your arms (e.g., wheelchair or bedside chair)?: None Help needed to walk in hospital room?: None Help needed climbing 3-5 steps with a railing? : A Little 6 Click Score: 23    End of Session Equipment Utilized During Treatment: Gait belt Activity Tolerance: Patient limited by fatigue;Patient tolerated treatment well Patient left: with chair alarm set;with family/visitor present;in chair(informed nurse of need for chair alarm hook-up) Nurse Communication: Mobility status(O2 status t/o session) PT Visit Diagnosis: Muscle weakness (generalized) (M62.81);Difficulty in walking, not elsewhere classified (R26.2)    Time: 9741-6384 PT Time Calculation (min) (ACUTE ONLY): 34 min   Charges:   PT Evaluation $PT Eval Low Complexity: 1 Low PT Treatments $Gait Training: 8-22 mins        Kreg Shropshire, DPT 01/31/2019, 1:14 PM

## 2019-01-31 NOTE — Progress Notes (Signed)
[facility] [place] NAME: Teresa Moyer    MR#:  [id number]  DATE OF BIRTH:  [birth year]  SUBJECTIVE: 83 year old female patient admitted for shortness of breath, patient oxygen saturation 88% yesterday when she was at cardiac rehab.  Admitted for CHF exacerbation.  CHIEF COMPLAINT:   Chief Complaint  Patient presents with  . Shortness of Breath  On IV Lasix, O2 saturation 98% on 3 L.  Patient says that she is feeling better. REVIEW OF SYSTEMS:   ROS CONSTITUTIONAL: No fever, fatigue or weakness.  EYES: No blurred or double vision.  EARS, NOSE, AND THROAT: No tinnitus or ear pain.  RESPIRATORY: No cough, shortness of breath, wheezing or hemoptysis.  CARDIOVASCULAR: No chest pain, orthopnea, edema.  GASTROINTESTINAL: No nausea, vomiting, diarrhea or abdominal pain.  GENITOURINARY: No dysuria, hematuria.  ENDOCRINE: No polyuria, nocturia,  HEMATOLOGY: No anemia, easy bruising or bleeding SKIN: No rash or lesion. MUSCULOSKELETAL: No joint pain or arthritis.   NEUROLOGIC: No tingling, numbness, weakness.  PSYCHIATRY: No anxiety or depression.   DRUG ALLERGIES:   Allergies  Allergen Reactions  . Accupril [Quinapril Hcl] Hives and Swelling    SWELLING OF NOSE/FACE  . Sulfa Antibiotics Shortness Of Breath and Swelling  . Clinoril [Sulindac] Other (See Comments)    Unsure of reaction type  . Hydrochlorothiazide Other (See Comments)    Unsure of reaction type  . Tessalon [Benzonatate] Other (See Comments)    Unsure of reaction type  . Amoxicillin Rash    Has patient had a PCN reaction causing immediate rash, facial/tongue/throat swelling, SOB or lightheadedness with hypotension: Unknown Has patient had a PCN reaction causing severe rash involving mucus membranes or skin necrosis: Unknown Has patient had a PCN reaction that required hospitalization: No Has patient had a PCN reaction occurring within the last 10 years:Possibly  unsure  If all of the above answers are "NO", then may proceed with Cephalosporin use.   . Codeine Sulfate Nausea And Vomiting  . Penicillins Rash    Has patient had a PCN reaction causing immediate rash, facial/tongue/throat swelling, SOB or lightheadedness with hypotension: Unknown Has patient had a PCN reaction causing severe rash involving mucus membranes or skin necrosis: Unknown Has patient had a PCN reaction that required hospitalization: No Has patient had a PCN reaction occurring within the last 10 years:Possibly unsure  If all of the above answers are "NO", then may proceed with Cephalosporin use.    VITALS:  Blood pressure 137/67, pulse 74, temperature 98.1 F (36.7 C), temperature source Oral, resp. rate 20, height 5\' 2"  (1.575 m), weight 65.6 kg, SpO2 98 %.  PHYSICAL EXAMINATION:  GENERAL:  83 y.o.-year-old patient lying in the bed with no acute distress.  EYES: Pupils equal, round, reactive to light No scleral icterus. Extraocular muscles intact.  HEENT: Head atraumatic, normocephalic. Oropharynx and nasopharynx clear.  NECK:  Supple, no jugular venous distention. No thyroid enlargement, no tenderness.  LUNGS:  diminished breath sounds at bases.  CARDIOVASCULAR: S1, S2 normal. No murmurs, rubs, or gallops.  ABDOMEN: Soft, nontender, nondistended. Bowel sounds present. No organomegaly or mass.  EXTREMITIES: No pedal edema, cyanosis, or clubbing.  NEUROLOGIC: Cranial nerves II through XII are intact. Muscle strength 5/5 in all extremities. Sensation intact. Gait not checked.  PSYCHIATRIC: The patient is alert and oriented x 3.  SKIN: No obvious rash, lesion, or ulcer.    LABORATORY PANEL:   CBC Recent Labs  Lab 01/31/19 0452  WBC  7.8  HGB 11.9*  HCT 35.4*  PLT 269   ------------------------------------------------------------------------------------------------------------------  Chemistries  Recent Labs  Lab 01/30/19 1606 01/31/19 0452  NA 134* 134*  K 3.8  3.1*  CL 103 103  CO2 22 22  GLUCOSE 109* 96  BUN 13 11  CREATININE 0.59 0.54  CALCIUM 9.3 8.7*  AST 27  --   ALT 25  --   ALKPHOS 175*  --   BILITOT 1.3*  --    ------------------------------------------------------------------------------------------------------------------  Cardiac Enzymes No results for input(s): TROPONINI in the last 168 hours. ------------------------------------------------------------------------------------------------------------------  RADIOLOGY:  Dg Chest 2 View  Result Date: 01/30/2019 CLINICAL DATA:  Shortness of breath EXAM: CHEST - 2 VIEW COMPARISON:  12/27/2018 FINDINGS: Cardiac shadow is stable. The lungs are well aerated bilaterally with interstitial prominence similar to that seen on the prior exam likely representing a degree of CHF. Small bilateral pleural effusions are noted new from the prior exam. Hyperinflation of the lungs is seen. IMPRESSION: Changes consistent with CHF. Electronically Signed   By: Inez Catalina M.D.   On: 01/30/2019 16:42    EKG:   Orders placed or performed during the hospital encounter of 01/30/19  . ED EKG  . ED EKG  . EKG    ASSESSMENT AND PLAN:   Acute on chronic systolic heart failure, EF 45 to 50% recently, on IV diuretics, clinically improving.  Check daily weights, wean off oxygen as tolerated. Continue IV diuretics, Coreg, irbesartan, check daily weights.  Follows up with Dr. Nehemiah Massed.  2.  Recent history of -ST elevation MI status post drug-eluting stent in proximal LAD, patient also had nonocclusive residual thrombus in ostium of D1, this was done on July 14, patient is on aspirin, Plavix now.  Continue aspirin, statins, beta-blockers #3 hypokalemia secondary to diuretics, replace potassium. 4.  GERD continue PPIs.  #5. acute respiratory failure secondary to CHF exacerbation, wean off oxygen as tolerated, patient says she is feeling better. Elevated troponins, likely secondary to demand ischemia in  setting of CHF exacerbation, no chest pain, cardiology is on the case, Dr. Everlene Balls is on call today.  All the records are reviewed and case discussed with Care Management/Social Workerr. Management plans discussed with the patient, family and they are in agreement.  CODE STATUS: full  TOTAL TIME TAKING CARE OF THIS PATIENT: 40 minutes.   POSSIBLE D/C IN 1-2 DAYS, DEPENDING ON CLINICAL CONDITION.   Epifanio Lesches M.D on 01/31/2019 at 1:59 PM  Between 7am to 6pm - Pager - [phone]  After 6pm go to [email or web address] - password [email or web address] [facility]  Office  [phone]  CC: Primary care physician; Jerrol Banana., MD   Note: This dictation was prepared with Dragon dictation along with smaller phrase technology. Any transcriptional errors that result from this process are unintentional.

## 2019-02-01 LAB — BASIC METABOLIC PANEL
Anion gap: 8 (ref 5–15)
BUN: 15 mg/dL (ref 8–23)
CO2: 23 mmol/L (ref 22–32)
Calcium: 8.7 mg/dL — ABNORMAL LOW (ref 8.9–10.3)
Chloride: 102 mmol/L (ref 98–111)
Creatinine, Ser: 0.56 mg/dL (ref 0.44–1.00)
GFR calc Af Amer: >60 mL/min (ref >60)
GFR calc non Af Amer: >60 mL/min (ref >60)
Glucose, Bld: 102 mg/dL — ABNORMAL HIGH (ref 70–99)
Potassium: 3.1 mmol/L — ABNORMAL LOW (ref 3.5–5.1)
Sodium: 133 mmol/L — ABNORMAL LOW (ref 135–145)

## 2019-02-01 MED ORDER — FUROSEMIDE 20 MG PO TABS: 20.0000 mg | ORAL_TABLET | Freq: Every day | ORAL | 1 refills | Status: DC

## 2019-02-01 MED ORDER — POTASSIUM CHLORIDE CRYS ER 20 MEQ PO TBCR
40.0000 meq | EXTENDED_RELEASE_TABLET | ORAL | Status: AC
  Administered 2019-02-01 (×2): 40 meq via ORAL
  Filled 2019-02-01 (×2): qty 2

## 2019-02-01 MED ORDER — POTASSIUM CHLORIDE ER 10 MEQ PO TBCR: 10.0000 meq | EXTENDED_RELEASE_TABLET | Freq: Every day | ORAL | 0 refills | Status: DC

## 2019-02-01 NOTE — Progress Notes (Signed)
     Teresa Moyer was admitted to the Hospital on 01/30/2019 and Discharged  02/01/2019 and should be excused from cardiac rehab till August 21.  Can start back on August 24.     Epifanio Lesches M.D on 02/01/2019,at 4:08 PM

## 2019-02-01 NOTE — Plan of Care (Signed)
  Problem: Education: Goal: Knowledge of General Education information will improve Description: Including pain rating scale, medication(s)/side effects and non-pharmacologic comfort measures Outcome: Completed/Met   Problem: Health Behavior/Discharge Planning: Goal: Ability to manage health-related needs will improve Outcome: Completed/Met   Problem: Clinical Measurements: Goal: Ability to maintain clinical measurements within normal limits will improve Outcome: Completed/Met Goal: Will remain free from infection Outcome: Completed/Met Goal: Diagnostic test results will improve Outcome: Completed/Met Goal: Respiratory complications will improve Outcome: Completed/Met Goal: Cardiovascular complication will be avoided Outcome: Completed/Met   Problem: Activity: Goal: Risk for activity intolerance will decrease Outcome: Completed/Met   Problem: Nutrition: Goal: Adequate nutrition will be maintained Outcome: Completed/Met   Problem: Coping: Goal: Level of anxiety will decrease Outcome: Completed/Met   Problem: Elimination: Goal: Will not experience complications related to bowel motility Outcome: Completed/Met Goal: Will not experience complications related to urinary retention Outcome: Completed/Met   Problem: Pain Managment: Goal: General experience of comfort will improve Outcome: Completed/Met   Problem: Safety: Goal: Ability to remain free from injury will improve Outcome: Completed/Met   Problem: Skin Integrity: Goal: Risk for impaired skin integrity will decrease Outcome: Completed/Met   Problem: Education: Goal: Ability to demonstrate management of disease process will improve Outcome: Completed/Met Goal: Ability to verbalize understanding of medication therapies will improve Outcome: Completed/Met Goal: Individualized Educational Video(s) 02/01/2019 1441 by Etheleen Nicks, RN Outcome: Completed/Met 02/01/2019 1421 by Etheleen Nicks, RN Outcome:  Progressing Note: Pt and husband viewed all 4 videos    Problem: Activity: Goal: Capacity to carry out activities will improve Outcome: Completed/Met   Problem: Cardiac: Goal: Ability to achieve and maintain adequate cardiopulmonary perfusion will improve Outcome: Completed/Met

## 2019-02-01 NOTE — Plan of Care (Signed)
  Problem: Education: Goal: Individualized Educational Video(s) Outcome: Progressing Note: Pt and husband viewed all 4 videos

## 2019-02-01 NOTE — Plan of Care (Signed)
Nutrition Education Note  RD consulted for nutrition education regarding CHF.  83 y/o female admitted with CHF  RD familiar with this pt from recent previous admit. Pt provided with CHF diet education at that time. Checked in on pt to see the changes she has made. Pt is still struggling with eating out at restaurants a lot. Help patient with changes she can make while dining out.   RD provided "Low Sodium Nutrition Therapy" handout from the Academy of [employer]etics. Reviewed patient's dietary recall. Provided examples on ways to decrease sodium intake in diet. Discouraged intake of processed foods and use of salt shaker. Encouraged fresh fruits and vegetables as well as whole grain sources of carbohydrates to maximize fiber intake.   RD discussed why it is important for patient to adhere to diet recommendations, and emphasized the role of fluids, foods to avoid, and importance of weighing self daily. Teach back method used.  Expect fair compliance.  Body mass index is 25.97 kg/m. Pt meets criteria for overweight based on current BMI.  Current diet order is 2 gram, patient is consuming approximately 100% of meals at this time. Labs and medications reviewed. No further nutrition interventions warranted at this time. RD contact information provided. If additional nutrition issues arise, please re-consult RD.   Koleen Distance MS, RD, LDN Pager #- [phone] Office#- [phone] After Hours Pager: [phone]

## 2019-02-01 NOTE — Progress Notes (Signed)
Pt discharged to home via wc.  Instructions  given to pt.  Questions answered.  No distress.  

## 2019-02-01 NOTE — Progress Notes (Signed)
Cardiovascular and Pulmonary Nurse Navigator Note:     83 y.o. female with a known history of hypertension, GERD, recent STEMI s/p PCI with DES to the mid LAD ~1 month ago who presented to the ED with shortness of breath. Patient states she has been short of breath since her heart attack 1 month ago, but it has been getting progressively worse over the last couple of days.  She endorses dyspnea on exertion and orthopnea.  She denies any lower extremity edema.  She was seen by her cardiologist last Tuesday, and her Brilinta was switched to Plavix.  She denied any chest pain upon presentation to the ER.  BNP 1930.  CXR revealed CHF.  Echo completed on 12/27/2018 during previous admission.    ECHOCARDIOGRAM REPORT   Patient Name:   Teresa Moyer Date of Exam: 12/27/2018 Medical Rec #:  [id number]       Height:       62.0 in Accession #:    [id number]      Weight:       140.0 lb Date of Birth:  1935-09-04       BSA:          1.64 m Patient Age:    76 years        BP:           167/80 mmHg Patient Gender: F               HR:           79 bpm. Exam Location:  [facility]    Procedure: 2D Echo, Cardiac Doppler and Color Doppler  Indications:     410 Acute Myocardial infarction   History:         Patient has no prior history of Echocardiogram examinations.                  Risk Factors: Hypertension. Murmur.   Sonographer:     Wilford Sports Rodgers-Jones Referring Phys:  [id number] CHRISTOPHER END Diagnosing Phys: Bartholome Bill MD  IMPRESSIONS  1. Stage 1: 1: Mid and apical anterior septum, mid and apical inferior septum, and basal inferior segment are abnormal.  2. The left ventricle has mildly reduced systolic function, with an ejection fraction of 45-50%. The cavity size was normal. There is mild concentric left ventricular hypertrophy. Left ventricular diastolic Doppler parameters are consistent with  impaired relaxation.  3. The right ventricle has normal systolic function. The cavity was normal. There is  no increase in right ventricular wall thickness.  4. The mitral valve was not well visualized. Mild thickening of the mitral valve leaflet.  5. The aortic valve was not well visualized. Mild thickening of the aortic valve. Mild calcification of the aortic valve. Aortic valve regurgitation is trivial by color flow Doppler. Mild stenosis of the aortic valve.  6. The interatrial septum was not assessed.   CHF Education:?? Patient lying in bed with HOB elevated.  Husband at bedside.  Patient gave verbal permission for this RN to speak in front of her husband about her health.   Educational session with patient / husband completed.  Reviewed with patient "Living Better with Heart Failure" packet. Briefly reviewed definition of heart failure and signs and symptoms of an exacerbation.?Discussed potential causes of CHF.  Explained to patient that HF is a chronic illness which requires self-assessment / self-management along with help from the cardiologist/PCP.??Discussed definition of EF measurement along with normal value compared to patient's EF.   *Reviewed  importance of and reason behind checking weight daily in the AM, after using the bathroom, but before getting dressed.  Patient has a functioning scale.    *Reviewed with patient the following information: *Discussed when to call the Dr= weight gain of >2-3lb overnight of 5lb in a week, *Discussed yellow zone= call MD: weight gain of >2-3lb overnight of 5lb in a week, increased swelling, increased SOB when lying down, chest discomfort, dizziness, increased fatigue *Red Zone= call 911: struggle to breath, fainting or near fainting, significant chest pain ?  *Heart Failure Zone Magnet given and reviewed with patient.   *Diet -   Referral for Dietitian Consultation for diet education has been ordered and completed via phone this a.m.   Instructed patient to follow a low sodium diet of 2000 mg or less.  Recommended foods for low sodium discussed.   Handout provided:  Sodium Content of Foods.  Reviewed with patient steps to reading a food label with close attention to serving size and mg of sodium.  This RN spent a lot of time talking with patient and husband about low sodium diet. Notified the Cardiac Rehab team of need for further follow-up and education by Cardiac Rehab dietitian and staff.    *Discussed fluid intake with patient as well. Patient not currently on a fluid restriction, but advised no more than 64 ounces of fluid per day.   Demonstrated this volume to patient using the bedside water pitcher.   *Instructed patient to take medications as prescribed for heart failure. Explained briefly why patient is on the medications (either make you feel better, live longer or keep you out of the hospital) and discussed monitoring and side effects.  *Discussed exercise / activity. PT evaluated patient today and recommended return to Cardiac Rehab program.  Patient is currently enrolled in the Cardiac Rehab dept at [facility].  Patient will return to Cardiac Rehab on Monday, February 06, 2019.  Cardiac Rehab dept notified patient will return on Monday.    *Smoking Cessation- Patient is a NEVER smoker.     *[facility] Failure [facility] - Explained the purpose of the HF Clinic. ?Explained to patient the HF Clinic does not replace PCP nor Cardiologist, but is an additional resource to helping patient manage heart failure.  Patient is agreeable to being followed in the [facility]. Appt is scheduled for February 07, 2019 at 10 a.m.  Location discussed with patient and husband.    Again, the 5 Steps to Living Better with Heart Failure were reviewed with patient.  Patient / husband thanked me for providing the above information. ?  Roanna Epley, RN, BSN, [place]  [facility] Cardiac & Pulmonary Rehab  Cardiovascular & Pulmonary Nurse Navigator  Direct Line: [phone]  Department Phone #: [phone] Fax: [phone]  Email Address:  [email or web address]

## 2019-02-02 ENCOUNTER — Telehealth: Payer: Self-pay

## 2019-02-02 DIAGNOSIS — H353211 Exudative age-related macular degeneration, right eye, with active choroidal neovascularization: Secondary | ICD-10-CM | POA: Diagnosis not present

## 2019-02-02 DIAGNOSIS — H353122 Nonexudative age-related macular degeneration, left eye, intermediate dry stage: Secondary | ICD-10-CM | POA: Diagnosis not present
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
## 2019-02-02 NOTE — Telephone Encounter (Signed)
Transition Care Management Follow-up Telephone Call  Date of discharge and from where: [facility] on 02/01/19  How have you been since you were released from the hospital? Doing well, slept good last night. Declines SOB, pain, fever or n/v/d.  Any questions or concerns? No   Items Reviewed:  Did the pt receive and understand the discharge instructions provided? Yes   Medications obtained and verified? Declined reviewing all medications on the phone. Did discuss starting potassium and increasing Lasix for a few days. Will review the rest of the medications at [place] apt.   Any new allergies since your discharge? No   Dietary orders reviewed? Yes  Do you have support at home? Yes   Other (ie: DME, Home Health, etc) N/A  Functional Questionnaire: (I = Independent and D = Dependent)  Bathing/Dressing- I   Meal Prep- I  Eating- I  Maintaining continence- I  Transferring/Ambulation- I  Managing Meds- I   Follow up appointments reviewed:    PCP Hospital f/u appt confirmed? Yes  Scheduled to see Dr Rosanna Randy on 02/07/19 @ 3:40 PM.  [facility] f/u appt confirmed? Yes    Are transportation arrangements needed? No   If their condition worsens, is the pt aware to call  their PCP or go to the ED? Yes  Was the patient provided with contact information for the PCP's office or ED? Yes  Was the pt encouraged to call back with questions or concerns? Yes
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
## 2019-02-02 NOTE — Telephone Encounter (Signed)
HFU scheduled for 02/07/19 @ 3:40 PM.
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
## 2019-02-04 ENCOUNTER — Emergency Department
Admission: EM | Admit: 2019-02-04 | Discharge: 2019-02-04 | Disposition: A | Discharge status: Home / Self Care | Payer: Medicare Other | Attending: Emergency Medicine | Admitting: Emergency Medicine

## 2019-02-04 ENCOUNTER — Other Ambulatory Visit: Payer: Self-pay

## 2019-02-04 DIAGNOSIS — Z139 Encounter for screening, unspecified: Secondary | ICD-10-CM

## 2019-02-04 DIAGNOSIS — I5020 Unspecified systolic (congestive) heart failure: Secondary | ICD-10-CM | POA: Insufficient documentation

## 2019-02-04 DIAGNOSIS — Z79899 Other long term (current) drug therapy: Secondary | ICD-10-CM | POA: Diagnosis not present

## 2019-02-04 DIAGNOSIS — Z7982 Long term (current) use of aspirin: Secondary | ICD-10-CM | POA: Insufficient documentation

## 2019-02-04 DIAGNOSIS — I10 Essential (primary) hypertension: Secondary | ICD-10-CM | POA: Diagnosis not present

## 2019-02-04 DIAGNOSIS — Z0000 Encounter for general adult medical examination without abnormal findings: Secondary | ICD-10-CM | POA: Insufficient documentation

## 2019-02-04 DIAGNOSIS — R031 Nonspecific low blood-pressure reading: Secondary | ICD-10-CM | POA: Diagnosis present

## 2019-02-04 LAB — CBC
HCT: 37.4 % (ref 36.0–46.0)
Hemoglobin: 12 g/dL (ref 12.0–15.0)
MCH: 29.5 pg (ref 26.0–34.0)
MCHC: 32.1 g/dL (ref 30.0–36.0)
MCV: 91.9 fL (ref 80.0–100.0)
Platelets: 284 10*3/uL (ref 150–400)
RBC: 4.07 MIL/uL (ref 3.87–5.11)
RDW: 13.1 % (ref 11.5–15.5)
WBC: 7.7 10*3/uL (ref 4.0–10.5)
nRBC: 0 % (ref 0.0–0.2)

## 2019-02-04 LAB — BASIC METABOLIC PANEL
Anion gap: 11 (ref 5–15)
BUN: 18 mg/dL (ref 8–23)
CO2: 22 mmol/L (ref 22–32)
Calcium: 9 mg/dL (ref 8.9–10.3)
Chloride: 99 mmol/L (ref 98–111)
Creatinine, Ser: 0.67 mg/dL (ref 0.44–1.00)
GFR calc Af Amer: >60 mL/min (ref >60)
GFR calc non Af Amer: >60 mL/min (ref >60)
Glucose, Bld: 125 mg/dL — ABNORMAL HIGH (ref 70–99)
Potassium: 4.8 mmol/L (ref 3.5–5.1)
Sodium: 132 mmol/L — ABNORMAL LOW (ref 135–145)

## 2019-02-04 NOTE — ED Triage Notes (Signed)
Pt arrives via pov from home with c/o hypotension. resently discharged on wed. Previous visit for fluid in lungs from CHF. PT given double dose lasix for 3 days 20-22nd. Dose taken yesterday and today is 40 mg. Pt reports BP at home of 91/56 when she woke up, then increased to 105/58. NAD noted at this time.
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
## 2019-02-04 NOTE — ED Notes (Signed)
Pt alert and oriented, pt denies LOC or dizziness. Pt ambulating in room.

## 2019-02-05 NOTE — Progress Notes (Deleted)
   Patient ID: Teresa Moyer, female    DOB: Mar 12, 1936, 83 y.o.   MRN: [id number]  HPI  Teresa Moyer is a 83 y/o female with a history of  Echo report from 12/27/2018 reviewed and showed an EF of 45-50%.  Was in the ED 02/04/2019 due to hypotension at home. She was treated and released. Admitted 01/30/2019 due to acute on chronic heart failure. Cardiology consult obtained. Initially given IV lasix and then transitioned to oral diuretics. Elevated troponin thought to be due to HF exacerbation. Discharged after 2 days. Admitted 12/27/2018 due to acute STEMI. Cardiology consult obtained. Had PCI with DES to the mid LAD. Discharged after 2 days.    She presents today for her initial visit with a chief complaint of   Review of Systems    Physical Exam    Assessment & Plan:  1: Chronic heart failure with mildly reduced ejection fraction- - NYHA class - EF >40% so would not qualify for entresto or farxiga - BNP 01/30/2019 was 1930.0   2: HTN- - BP  - BMP from 02/04/2019 reviewed and showed sodium 132, potassium 4.8, creatinine 0.67 and GFR >60

## 2019-02-06 ENCOUNTER — Other Ambulatory Visit: Payer: Self-pay

## 2019-02-06 ENCOUNTER — Telehealth: Payer: Self-pay

## 2019-02-06 ENCOUNTER — Ambulatory Visit (INDEPENDENT_AMBULATORY_CARE_PROVIDER_SITE_OTHER): Payer: Medicare Other

## 2019-02-06 DIAGNOSIS — I509 Heart failure, unspecified: Secondary | ICD-10-CM

## 2019-02-06 DIAGNOSIS — I2102 ST elevation (STEMI) myocardial infarction involving left anterior descending coronary artery: Secondary | ICD-10-CM

## 2019-02-06 NOTE — Patient Instructions (Signed)
Thank you allowing the Chronic Care Management Team to be a part of your care! It was a pleasure speaking with you today!  1. Please continue to take your medications as prescribed 2. Weigh yourself daily and record. If you gain 2-3 lbs overnight or 5 lbs in a week you need to call your cardiologist or [facility]. 3. Continue exercising with cardiac rehab once your cardiologist has approved your return 4. Please limit your sodium in your diet. This will cause you to retain fluid which will make your breathing worse.  CCM (Chronic Care Management) Team   Trish Fountain RN, BSN Nurse Care Coordinator  [phone]  Ruben Reason PharmD  Clinical Pharmacist  [phone]   Elliot Gurney, LCSW Clinical Social Worker [phone]  Goals Addressed            This Visit's Progress   . I need to make sure my stent does not close up (pt-stated)   Not on track    Current Barriers:  . Chronic Disease Management support and education needs related to CAD  Nurse Case Manager Clinical Goal(s):  Marland Kitchen Over the next 14 days, patient will verbalize understanding of plan for CAD management including exercise, heart healthy diet, medication management/compliance (statin, betablocker, antiplatelet), BP control . Over the next 90 days, patient will not experience hospital admission. Hospital Admissions in last 6 months = 2  Interventions:  . Evaluation of current treatment plan related to CAD/recent MI and patient's adherence to plan as established by provider. . Discussed plans with patient for ongoing care management follow up and provided patient with direct contact information for care management team . Provided emotional support and reassurance regarding transition from [place] to Plavix secondary to ongoing shortness of breath . Reviewed s/s of MI and assessed patients knowledge of when to call 911 . Reviewed scheduled/upcoming provider appointments including: 02/10/2019 with  cardiology . Reviewed recent hospitalization for CHF exacerbation and ED visit 01/30/2019 . Encouraged patient to check BP weekly and record to establish a baseline . Reviewed recent cardiology and cardiac rehab notes  Patient Self Care Activities:  . Self administers medications as prescribed . Attends all scheduled provider appointments . Calls pharmacy for medication refills . Attends church or other social activities . Performs ADL's independently . Performs IADL's independently . Calls provider office for new concerns or questions . Attends Cardiac Rehab  Initial goal documentation        The patient verbalized understanding of instructions provided today and declined a print copy of patient instruction materials.   The care management team will reach out to the patient again over the next 60 days.  The patient has been provided with contact information for the care management team and has been advised to call with any health related questions or concerns.   SYMPTOMS OF A STROKE   You have any symptoms of stroke. "BE FAST" is an easy way to remember the main warning signs: ? B - Balance. Signs are dizziness, sudden trouble walking, or loss of balance. ? E - Eyes. Signs are trouble seeing or a sudden change in how you see. ? F - Face. Signs are sudden weakness or loss of feeling of the face, or the face or eyelid drooping on one side. ? A - Arms. Signs are weakness or loss of feeling in an arm. This happens suddenly and usually on one side of the body. ? S - Speech. Signs are sudden trouble speaking, slurred speech, or  trouble understanding what people say. ? T - Time. Time to call emergency services. Write down what time symptoms started.  You have other signs of stroke, such as: ? A sudden, very bad headache with no known cause. ? Feeling sick to your stomach (nausea). ? Throwing up (vomiting). ? Jerky movements you cannot control (seizure).  SYMPTOMS OF A HEART  ATTACK  What are the signs or symptoms? Symptoms of this condition include:  Chest pain. It may feel like: ? Crushing or squeezing. ? Tightness, pressure, fullness, or heaviness.  Pain in the arm, neck, jaw, back, or upper body.  Shortness of breath.  Heartburn.  Indigestion.  Nausea.  Cold sweats.  Feeling tired.  Sudden lightheadedness.   HEART FAILURE ACTION PLAN Actions to Take if My Symptoms Get Worse   What ZONE are you in today? Green, Yellow, or Red?   GREEN ZONE: This is your goal  . No shortness of breath or trouble breathing. . No weight gain of more then 3 pounds in one day or 5 pounds in a week. Marland Kitchen No swelling in your feet, ankles, stomach, or hands. . No chest discomfort, heaviness, or pain .   YELLOW ZONE: Call your doctor TODAY to get help  You may have one or more of the following:  . Weight gain of 3 pounds in 1 day or 5 pounds in 1 week. . More swelling of your feet, ankles, stomach, or hands. . It is harder for you to breathe when lying down. You need to sit up.  . Chest discomfort, heaviness, or pain. . You feel more tired or have less energy than normal. . New or worsening dizziness. . Dry hacky cough. . You feel uneasy and you know something is not right.   RED ZONE: EMERGENCY CALL 911!  . Struggling to breathe. This does not go away when you sit up . Stronger and more regular amounts of chest discomfort.  . New confusion or cant think clearly.  . Fainting or near fainting.

## 2019-02-06 NOTE — Chronic Care Management (AMB) (Signed)
Chronic Care Management   Follow Up Note   02/06/2019 Name: Teresa Moyer MRN: [id number] DOB: [birth year]  Referred by: Jerrol Banana., MD Reason for referral : Chronic Care Management (follow up CHF/MI/recent hospitalization)   Subjective: "I have just not felt well over the last week"   Objective:  BP Readings from Last 3 Encounters:  02/04/19 (!) 165/88  02/01/19 (!) 144/67  01/03/19 128/74     Assessment: Teresa. Teresa Newgent. Moyer is an 83 year old female patient of Dr. Miguel Aschoff, who was referred to Chronic Case Management by her health plan. After referral was placed, patient was hospitalized with a STEMI requiring stent placement. She has a history of but not limited to HTN, STEMI, GERD, HLD, and Skin CA.  Review of patient status, including review of consultants reports, relevant laboratory and other test results, and collaboration with appropriate care team members and the patient's provider was performed as part of comprehensive patient evaluation and provision of chronic care management services.    Goals Addressed            This Visit's Progress   . I need to make sure my stent does not close up (pt-stated)   Not on track    RN CM followed up with Teresa Moyer today via telephone to assess progression towards health goals. Upon EMR review, it is noted patient had 3 day hospitalization followed by an ED visit since last encounter. Patient presented to cardiac rehab last week and was taken to the ED for fluid overload and shortness of breath. Patient was diuresed and dischagred home 3 days later with instructions to increase her lasix x 3 days. On Saturday, she reported low BP to pcp office and was instructed to present to the ED. There she was found to be a little hypertensive. Per ED notes, provider was unsure if patients home monitor is accurate. Patient is to follow up with PCP tomorrow, cardiology 02/10/2019, and CHF clinic 02/15/2019. Patient continues with  cardiac rehab but states she will not return until after cardiology follow up. She denies s/s of hypotension. BP this am 96/56 but currently 131/75. Patient reports BP yesterday 120/68 and 126/66. She continues to weigh every day and is instructed to contact [facility] if she gains 2 lbs overnight or 5 lbs in a week. She continues to take all medications as prescribed. She continues to take her plavix as prescribed but unsure if her shortness of breath is improved because of switching to plavix from brillinta or because of the diuresing.   Current Barriers:  . Chronic Disease Management support and education needs related to CAD  Nurse Case Manager Clinical Goal(s):  Marland Kitchen Over the next 14 days, patient will verbalize understanding of plan for CAD management including exercise, heart healthy diet, medication management/compliance (statin, betablocker, antiplatelet), BP control . Over the next 90 days, patient will not experience hospital admission. Hospital Admissions in last 6 months = 2  Interventions:  . Evaluation of current treatment plan related to CAD/recent MI and patient's adherence to plan as established by provider. . Discussed plans with patient for ongoing care management follow up and provided patient with direct contact information for care management team . Provided emotional support and reassurance regarding transition from [place] to Plavix secondary to ongoing shortness of breath . Reviewed s/s of MI and assessed patients knowledge of when to call 911 . Reviewed scheduled/upcoming provider appointments including: 02/10/2019 with cardiology . Encouraged patient to check BP  weekly and record to establish a baseline . Reviewed recent cardiology and cardiac rehab notes  Patient Self Care Activities:  . Self administers medications as prescribed . Attends all scheduled provider appointments . Calls pharmacy for medication refills . Attends church or other social activities .  Performs ADL's independently . Performs IADL's independently . Calls provider office for new concerns or questions . Attends Cardiac Rehab  Initial goal documentation         The care management team will reach out to the patient again over the next 14 days.  The patient has been provided with contact information for the care management team and has been advised to call with any health related questions or concerns.   Doyal Saric E. Rollene Rotunda, RN, BSN Nurse Care Coordinator [facility] Care Management [phone]

## 2019-02-06 NOTE — Telephone Encounter (Signed)
Teresa Moyer is feeling tired today and doesn't feel like she should exercise - she was admitted last week for CHF and went back to ER Saturday for low BP.  She sees Dr Nehemiah Massed Friday.  She may try to attend Wednesday if she feels better.

## 2019-02-07 ENCOUNTER — Encounter: Payer: Self-pay | Admitting: Family Medicine

## 2019-02-07 ENCOUNTER — Ambulatory Visit (INDEPENDENT_AMBULATORY_CARE_PROVIDER_SITE_OTHER): Payer: Medicare Other | Admitting: Family Medicine

## 2019-02-07 ENCOUNTER — Ambulatory Visit: Payer: Medicare Other | Admitting: Family

## 2019-02-07 VITALS — BP 108/60 | HR 76 | Temp 98.0°F | Resp 15 | Wt 144.4 lb

## 2019-02-07 DIAGNOSIS — E785 Hyperlipidemia, unspecified: Secondary | ICD-10-CM

## 2019-02-07 DIAGNOSIS — I2102 ST elevation (STEMI) myocardial infarction involving left anterior descending coronary artery: Secondary | ICD-10-CM | POA: Diagnosis not present

## 2019-02-07 DIAGNOSIS — I10 Essential (primary) hypertension: Secondary | ICD-10-CM | POA: Diagnosis not present

## 2019-02-07 DIAGNOSIS — I509 Heart failure, unspecified: Secondary | ICD-10-CM

## 2019-02-07 NOTE — Progress Notes (Signed)
Patient: Teresa Moyer Female    DOB: 1936/02/05   83 y.o.   MRN: [id number] Visit Date: 02/07/2019  Today's Provider: [name], MD   Chief Complaint  Patient presents with  . Hospitalization Follow-up   Subjective:     HPI  Follow up ER visit  Patient was seen in ER for shortness of breath on 01/30/19. She was treated for Acute Respiratory failure with hypoxia. Treatment for this included Klor- Con 10 and Furosemide 40mg  for the first 3 days then decreased to 20mg  daily. Patient reports that she returned back to ED on the 3 days later with low blood pressure. Patient reports that low blood pressure was believed to be due to the increased Lasix dosage at the time. She reports excellent compliance with treatment. She reports this condition is Improved.  ------------------------------------------------------------------------------------   Allergies  Allergen Reactions  . Accupril [Quinapril Hcl] Hives and Swelling    SWELLING OF NOSE/FACE  . Sulfa Antibiotics Shortness Of Breath and Swelling  . Clinoril [Sulindac] Other (See Comments)    Unsure of reaction type  . Hydrochlorothiazide Other (See Comments)    Unsure of reaction type  . Tessalon [Benzonatate] Other (See Comments)    Unsure of reaction type  . Amoxicillin Rash    Has patient had a PCN reaction causing immediate rash, facial/tongue/throat swelling, SOB or lightheadedness with hypotension: Unknown Has patient had a PCN reaction causing severe rash involving mucus membranes or skin necrosis: Unknown Has patient had a PCN reaction that required hospitalization: No Has patient had a PCN reaction occurring within the last 10 years:Possibly unsure  If all of the above answers are "NO", then may proceed with Cephalosporin use.   . Codeine Sulfate Nausea And Vomiting  . Penicillins Rash    Has patient had a PCN reaction causing immediate rash, facial/tongue/throat swelling, SOB or lightheadedness  with hypotension: Unknown Has patient had a PCN reaction causing severe rash involving mucus membranes or skin necrosis: Unknown Has patient had a PCN reaction that required hospitalization: No Has patient had a PCN reaction occurring within the last 10 years:Possibly unsure  If all of the above answers are "NO", then may proceed with Cephalosporin use.     Current Outpatient Medications:  .  acetaminophen (TYLENOL) 650 MG CR tablet, Take 650 mg by mouth every 8 (eight) hours as needed for pain., Disp: , Rfl:  .  aspirin 81 MG chewable tablet, Chew 1 tablet (81 mg total) by mouth daily., Disp: 30 tablet, Rfl: 0 .  atorvastatin (LIPITOR) 40 MG tablet, Take 1 tablet (40 mg total) by mouth daily at 6 PM., Disp: 30 tablet, Rfl: 0 .  azelastine (ASTELIN) 0.1 % nasal spray, Place 1 spray into both nostrils at bedtime. , Disp: , Rfl:  .  Carboxymethylcell-Hypromellose (GENTEAL) 0.25-0.3 % GEL, Apply 1 application to eye at bedtime., Disp: , Rfl:  .  carvedilol (COREG) 6.25 MG tablet, Take 1 tablet (6.25 mg total) by mouth 2 (two) times daily with a meal., Disp: 60 tablet, Rfl: 0 .  cetirizine (ZYRTEC ALLERGY) 10 MG tablet, Take 10 mg by mouth at bedtime. , Disp: , Rfl:  .  clopidogrel (PLAVIX) 75 MG tablet, Take 1 tablet by mouth daily., Disp: , Rfl:  .  fluticasone (FLONASE) 50 MCG/ACT nasal spray, Place 1 spray into both nostrils daily. , Disp: , Rfl:  .  furosemide (LASIX) 20 MG tablet, Take 1 tablet (20 mg total) by mouth  daily. Take 40 mg daily for 3 days,till Friday,after that resume 20 mg daily, Disp: 30 tablet, Rfl: 1 .  irbesartan (AVAPRO) 300 MG tablet, Take 0.5 tablets (150 mg total) by mouth daily., Disp: 30 tablet, Rfl: 0 .  Multiple Vitamins-Minerals (PRESERVISION AREDS PO), Take 1 tablet by mouth 2 (two) times daily. , Disp: , Rfl:  .  Nutritional Supplements (JOINT FORMULA PO), Take 1 capsule by mouth daily before breakfast. Arthrozene, Disp: , Rfl:  .  omeprazole (PRILOSEC) 20 MG  capsule, Take 1 capsule (20 mg total) by mouth daily., Disp: 90 capsule, Rfl: 3 .  Polyethyl Glycol-Propyl Glycol (SYSTANE) 0.4-0.3 % SOLN, Place 1 drop into both eyes 3 (three) times daily as needed (for dry eyes)., Disp: , Rfl:  .  potassium chloride (KLOR-CON 10) 10 MEQ tablet, Take 1 tablet (10 mEq total) by mouth daily., Disp: 30 tablet, Rfl: 0 .  Probiotic Product (ALIGN) 4 MG CAPS, Take 4 mg by mouth daily after lunch. , Disp: , Rfl:   Review of Systems  Constitutional: Negative.   HENT: Negative.   Eyes: Negative.   Respiratory: Negative.   Cardiovascular: Negative.   Gastrointestinal: Negative.   Endocrine: Negative.   Musculoskeletal: Negative.   Allergic/Immunologic: Negative.   Neurological: Negative.   Hematological: Negative.   Psychiatric/Behavioral: Negative.     Social History   Tobacco Use  . Smoking status: Never Smoker  . Smokeless tobacco: Never Used  Substance Use Topics  . Alcohol use: No      Objective:   BP 108/60   Pulse 76   Temp 98 F (36.7 C) (Oral)   Resp 15   Wt 144 lb 6.4 oz (65.5 kg)   SpO2 98%   BMI 25.99 kg/m  Vitals:   02/07/19 1607  BP: 108/60  Pulse: 76  Resp: 15  Temp: 98 F (36.7 C)  TempSrc: Oral  SpO2: 98%  Weight: 144 lb 6.4 oz (65.5 kg)     Physical Exam Vitals signs reviewed.  Constitutional:      Appearance: She is well-developed.  HENT:     Head: Normocephalic and atraumatic.     Right Ear: External ear normal.     Left Ear: External ear normal.     Nose: Nose normal.  Eyes:     Conjunctiva/sclera: Conjunctivae normal.     Pupils: Pupils are equal, round, and reactive to light.  Neck:     Musculoskeletal: Normal range of motion and neck supple.  Cardiovascular:     Rate and Rhythm: Normal rate and regular rhythm.     Heart sounds: Murmur (2/6 systolic murmur) present.     Comments: 2/6 early systolic murmur at RUSB. Pulmonary:     Effort: Pulmonary effort is normal.     Breath sounds: Normal breath  sounds.  Abdominal:     General: There is no distension.     Palpations: Abdomen is soft.     Tenderness: There is no abdominal tenderness.  Musculoskeletal: Normal range of motion.  Skin:    General: Skin is warm and dry.  Neurological:     Mental Status: She is alert and oriented to person, place, and time.     Deep Tendon Reflexes: Reflexes are normal and symmetric.  Psychiatric:        Behavior: Behavior normal.        Thought Content: Thought content normal.        Judgment: Judgment normal.      No results  found for any visits on 02/07/19.     Assessment & Plan    1. STEMI involving left anterior descending coronary artery [facility]) Per cardiology.  2. Acute on chronic congestive heart failure, unspecified heart failure type ([place]) Clinically improved.More than 50% 25 minute visit spent in counseling or coordination of care EF 45-50%. On Coreg,ARB,lasix. 3. Essential (primary) hypertension   4. Hyperlipidemia, unspecified hyperlipidemia type On lipitor.     Richard Cranford Mon, MD  [facility] Fritzi Mandes [place],acting as a scribe for [name], MD.,have documented all relevant documentation on the behalf of [name], MD,as directed by  [name], MD while in the presence of [name], MD.

## 2019-02-08 ENCOUNTER — Telehealth: Payer: Self-pay

## 2019-02-08 NOTE — Telephone Encounter (Signed)
Teresa Moyer called and is still having some BP issues - her PCP recommended she wait until she sees Dr Nehemiah Massed Friday

## 2019-02-10 DIAGNOSIS — I2510 Atherosclerotic heart disease of native coronary artery without angina pectoris: Secondary | ICD-10-CM | POA: Diagnosis not present

## 2019-02-10 DIAGNOSIS — I10 Essential (primary) hypertension: Secondary | ICD-10-CM | POA: Diagnosis not present

## 2019-02-10 DIAGNOSIS — I119 Hypertensive heart disease without heart failure: Secondary | ICD-10-CM | POA: Diagnosis not present

## 2019-02-13 ENCOUNTER — Other Ambulatory Visit: Payer: Self-pay

## 2019-02-13 ENCOUNTER — Encounter: Payer: Medicare Other | Admitting: *Deleted

## 2019-02-13 DIAGNOSIS — I213 ST elevation (STEMI) myocardial infarction of unspecified site: Secondary | ICD-10-CM
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
## 2019-02-13 NOTE — Progress Notes (Signed)
Daily Session Note  Patient Details  Name: Teresa Moyer MRN: [id number] Date of Birth: 02-Oct-1935 Referring Provider:     Cardiac Rehab from 01/05/2019 in [facility] Cardiac and Pulmonary Rehab  Referring Provider  Nehemiah Massed      Encounter Date: 02/13/2019  Check In: Session Check In - 02/13/19 1429      Check-In   Supervising physician immediately available to respond to emergencies  See telemetry face sheet for immediately available ER MD    Location  [facility]-Cardiac & Pulmonary Rehab    Staff Present  Renita Papa, RN BSN;Jeanna Durrell BS, Exercise Physiologist;Kelly Amedeo Plenty, BS, ACSM CEP, Exercise Physiologist    Virtual Visit  No    Medication changes reported      No    Fall or balance concerns reported     No    Warm-up and Cool-down  Performed on first and last piece of equipment    Resistance Training Performed  Yes    VAD Patient?  No    PAD/SET Patient?  No      Pain Assessment   Currently in Pain?  No/denies          Social History   Tobacco Use  Smoking Status Never Smoker  Smokeless Tobacco Never Used    Goals Met:  Independence with exercise equipment Exercise tolerated well Personal goals reviewed No report of cardiac concerns or symptoms Strength training completed today  Goals Unmet:  Not Applicable  Comments: Pt able to follow exercise prescription today without complaint.  Will continue to monitor for progression.    Dr. Emily Filbert is Medical Director for [place] and [name].

## 2019-02-14 DIAGNOSIS — I509 Heart failure, unspecified: Secondary | ICD-10-CM

## 2019-02-14 HISTORY — DX: Heart failure, unspecified: I50.9

## 2019-02-15 ENCOUNTER — Other Ambulatory Visit: Payer: Self-pay

## 2019-02-15 ENCOUNTER — Encounter: Payer: Medicare Other | Attending: Internal Medicine | Admitting: *Deleted

## 2019-02-15 ENCOUNTER — Ambulatory Visit: Payer: Medicare Other | Attending: Family | Admitting: Family

## 2019-02-15 ENCOUNTER — Encounter: Payer: Self-pay | Admitting: Family

## 2019-02-15 VITALS — BP 156/86 | HR 86 | Resp 18 | Ht 62.0 in | Wt 144.2 lb

## 2019-02-15 DIAGNOSIS — K219 Gastro-esophageal reflux disease without esophagitis: Secondary | ICD-10-CM | POA: Diagnosis not present

## 2019-02-15 DIAGNOSIS — Z7982 Long term (current) use of aspirin: Secondary | ICD-10-CM | POA: Insufficient documentation

## 2019-02-15 DIAGNOSIS — I252 Old myocardial infarction: Secondary | ICD-10-CM | POA: Insufficient documentation

## 2019-02-15 DIAGNOSIS — Z85828 Personal history of other malignant neoplasm of skin: Secondary | ICD-10-CM | POA: Insufficient documentation

## 2019-02-15 DIAGNOSIS — Z79899 Other long term (current) drug therapy: Secondary | ICD-10-CM | POA: Diagnosis not present

## 2019-02-15 DIAGNOSIS — Z7902 Long term (current) use of antithrombotics/antiplatelets: Secondary | ICD-10-CM | POA: Diagnosis not present

## 2019-02-15 DIAGNOSIS — Z7951 Long term (current) use of inhaled steroids: Secondary | ICD-10-CM | POA: Diagnosis not present

## 2019-02-15 DIAGNOSIS — I110 Hypertensive heart disease with heart failure: Secondary | ICD-10-CM | POA: Diagnosis not present

## 2019-02-15 DIAGNOSIS — I10 Essential (primary) hypertension: Secondary | ICD-10-CM

## 2019-02-15 DIAGNOSIS — I2102 ST elevation (STEMI) myocardial infarction involving left anterior descending coronary artery: Secondary | ICD-10-CM

## 2019-02-15 DIAGNOSIS — M1990 Unspecified osteoarthritis, unspecified site: Secondary | ICD-10-CM | POA: Insufficient documentation

## 2019-02-15 DIAGNOSIS — I5032 Chronic diastolic (congestive) heart failure: Secondary | ICD-10-CM | POA: Insufficient documentation

## 2019-02-15 DIAGNOSIS — I213 ST elevation (STEMI) myocardial infarction of unspecified site: Secondary | ICD-10-CM | POA: Insufficient documentation

## 2019-02-15 NOTE — Progress Notes (Signed)
Daily Session Note  Patient Details  Name: Teresa Moyer MRN: [id number] Date of Birth: 1935-12-08 Referring Provider:     Cardiac Rehab from 01/05/2019 in [facility] Cardiac and Pulmonary Rehab  Referring Provider  Nehemiah Massed      Encounter Date: 02/15/2019  Check In: Session Check In - 02/15/19 1431      Check-In   Supervising physician immediately available to respond to emergencies  See telemetry face sheet for immediately available ER MD    Location  [facility]-Cardiac & Pulmonary Rehab    Staff Present  Renita Papa, RN Vickki Hearing, BA, ACSM CEP, Exercise Physiologist;Joseph Tessie Fass RCP,RRT,BSRT    Virtual Visit  No    Medication changes reported      No    Fall or balance concerns reported     No    Warm-up and Cool-down  Performed on first and last piece of equipment    Resistance Training Performed  Yes    VAD Patient?  No    PAD/SET Patient?  No      Pain Assessment   Currently in Pain?  No/denies          Social History   Tobacco Use  Smoking Status Never Smoker  Smokeless Tobacco Never Used    Goals Met:  Independence with exercise equipment Exercise tolerated well No report of cardiac concerns or symptoms Strength training completed today  Goals Unmet:  Not Applicable  Comments: Pt able to follow exercise prescription today without complaint.  Will continue to monitor for progression.    Dr. Emily Filbert is Medical Director for [place] and [name].

## 2019-02-15 NOTE — Patient Instructions (Signed)
Continue weighing daily and call for an overnight weight gain of > 2 pounds or a weekly weight gain of >5 pounds. 

## 2019-02-16 ENCOUNTER — Encounter: Payer: Medicare Other | Admitting: *Deleted

## 2019-02-16 DIAGNOSIS — I213 ST elevation (STEMI) myocardial infarction of unspecified site: Secondary | ICD-10-CM | POA: Diagnosis not present

## 2019-02-16 NOTE — Progress Notes (Signed)
Daily Session Note  Patient Details  Name: Teresa Moyer MRN: [id number] Date of Birth: 1935-11-18 Referring Provider:     Cardiac Rehab from 01/05/2019 in [facility] Cardiac and Pulmonary Rehab  Referring Provider  Nehemiah Massed      Encounter Date: 02/16/2019  Check In: Session Check In - 02/16/19 1427      Check-In   Supervising physician immediately available to respond to emergencies  See telemetry face sheet for immediately available ER MD    Location  [facility]-Cardiac & Pulmonary Rehab    Staff Present  Renita Papa, RN BSN;Jessica Luan Pulling, MA, RCEP, CCRP, CCET;Joseph [place] RCP,RRT,BSRT    Virtual Visit  No    Medication changes reported      No    Fall or balance concerns reported     No    Warm-up and Cool-down  Performed on first and last piece of equipment    Resistance Training Performed  Yes    VAD Patient?  No    PAD/SET Patient?  No      Pain Assessment   Currently in Pain?  No/denies          Social History   Tobacco Use  Smoking Status Never Smoker  Smokeless Tobacco Never Used    Goals Met:  Independence with exercise equipment Exercise tolerated well No report of cardiac concerns or symptoms Strength training completed today  Goals Unmet:  Not Applicable  Comments: Pt able to follow exercise prescription today without complaint.  Will continue to monitor for progression.    Dr. Emily Filbert is Medical Director for [place] and [name].

## 2019-02-21 DIAGNOSIS — H353211 Exudative age-related macular degeneration, right eye, with active choroidal neovascularization: Secondary | ICD-10-CM | POA: Diagnosis not present

## 2019-02-21 DIAGNOSIS — H353221 Exudative age-related macular degeneration, left eye, with active choroidal neovascularization: Secondary | ICD-10-CM | POA: Diagnosis not present

## 2019-02-22 ENCOUNTER — Other Ambulatory Visit: Payer: Self-pay

## 2019-02-22 ENCOUNTER — Encounter: Payer: Medicare Other | Admitting: *Deleted

## 2019-02-22 ENCOUNTER — Encounter: Payer: Self-pay | Admitting: *Deleted

## 2019-02-22 DIAGNOSIS — I213 ST elevation (STEMI) myocardial infarction of unspecified site: Secondary | ICD-10-CM

## 2019-02-22 NOTE — Progress Notes (Signed)
Cardiac Individual Treatment Plan  Patient Details  Name: Teresa Moyer MRN: [id number] Date of Birth: 11-11-1935 Referring Provider:     Cardiac Rehab from 01/05/2019 in [facility] Cardiac and Pulmonary Rehab  Referring Provider  Nehemiah Massed      Initial Encounter Date:    Cardiac Rehab from 01/05/2019 in [facility] Cardiac and Pulmonary Rehab  Date  01/05/19      Visit Diagnosis: ST elevation myocardial infarction (STEMI), unspecified artery ([place])  Patient's Home Medications on Admission:  Current Outpatient Medications:  .  acetaminophen (TYLENOL) 650 MG CR tablet, Take 650 mg by mouth every 8 (eight) hours as needed for pain., Disp: , Rfl:  .  aspirin 81 MG chewable tablet, Chew 1 tablet (81 mg total) by mouth daily., Disp: 30 tablet, Rfl: 0 .  atorvastatin (LIPITOR) 40 MG tablet, Take 1 tablet (40 mg total) by mouth daily at 6 PM., Disp: 30 tablet, Rfl: 0 .  azelastine (ASTELIN) 0.1 % nasal spray, Place 1 spray into both nostrils at bedtime. , Disp: , Rfl:  .  Carboxymethylcell-Hypromellose (GENTEAL) 0.25-0.3 % GEL, Apply 1 application to eye at bedtime., Disp: , Rfl:  .  carvedilol (COREG) 6.25 MG tablet, Take 1 tablet (6.25 mg total) by mouth 2 (two) times daily with a meal., Disp: 60 tablet, Rfl: 0 .  cetirizine (ZYRTEC ALLERGY) 10 MG tablet, Take 10 mg by mouth at bedtime. , Disp: , Rfl:  .  clopidogrel (PLAVIX) 75 MG tablet, Take 1 tablet by mouth daily., Disp: , Rfl:  .  fluticasone (FLONASE) 50 MCG/ACT nasal spray, Place 1 spray into both nostrils daily. , Disp: , Rfl:  .  furosemide (LASIX) 20 MG tablet, Take 1 tablet (20 mg total) by mouth daily. Take 40 mg daily for 3 days,till Friday,after that resume 20 mg daily, Disp: 30 tablet, Rfl: 1 .  irbesartan (AVAPRO) 300 MG tablet, Take 0.5 tablets (150 mg total) by mouth daily., Disp: 30 tablet, Rfl: 0 .  Multiple Vitamins-Minerals (PRESERVISION AREDS PO), Take 1 tablet by mouth 2 (two) times daily. , Disp: , Rfl:  .  Nutritional  Supplements (JOINT FORMULA PO), Take 1 capsule by mouth daily before breakfast. Arthrozene, Disp: , Rfl:  .  omeprazole (PRILOSEC) 20 MG capsule, Take 1 capsule (20 mg total) by mouth daily., Disp: 90 capsule, Rfl: 3 .  Polyethyl Glycol-Propyl Glycol (SYSTANE) 0.4-0.3 % SOLN, Place 1 drop into both eyes 3 (three) times daily as needed (for dry eyes)., Disp: , Rfl:  .  potassium chloride (KLOR-CON 10) 10 MEQ tablet, Take 1 tablet (10 mEq total) by mouth daily., Disp: 30 tablet, Rfl: 0 .  Probiotic Product (ALIGN) 4 MG CAPS, Take 4 mg by mouth daily after lunch. , Disp: , Rfl:   Past Medical History: Past Medical History:  Diagnosis Date  . Cataracts, bilateral   . Environmental allergies   . GERD (gastroesophageal reflux disease)   . Heart murmur 2019   had since a child. dr. Nehemiah Massed follows d/t getting louder  . Hemorrhoids   . History of chickenpox   . Hypertension   . Incontinence in female   . Osteoarthritis   . Skin cancer 12/2017   BCC. nose removed from nose last week.  shoulder squamos cell removed previously  . Type O blood, Rh negative 12/2017   patient requests that this be present on her chart    Tobacco Use: Social History   Tobacco Use  Smoking Status Never Smoker  Smokeless Tobacco Never  Used    Labs: Recent Chemical engineer    Labs for ITP Cardiac and Pulmonary Rehab Latest Ref Rng & Units 09/01/2016 07/01/2017 11/18/2018 12/28/2018 01/11/2019   Cholestrol 100 - 199 mg/dL 171 167 163 - 112   LDLCALC 0 - 99 mg/dL 66 61 61 - 28   HDL >39 mg/dL 74 66 77 - 61   Trlycerides 0 - 149 mg/dL 157(H) 199(H) 127 - 113   Hemoglobin A1c 4.8 - 5.6 % - - - 5.8(H) -       Exercise Target Goals: Exercise Program Goal: Individual exercise prescription set using results from initial 6 min walk test and THRR while considering  patient's activity barriers and safety.   Exercise Prescription Goal: Initial exercise prescription builds to 30-45 minutes a day of aerobic  activity, 2-3 days per week.  Home exercise guidelines will be given to patient during program as part of exercise prescription that the participant will acknowledge.  Activity Barriers & Risk Stratification: Activity Barriers & Cardiac Risk Stratification - 01/02/19 1553      Activity Barriers & Cardiac Risk Stratification   Activity Barriers  Arthritis    Cardiac Risk Stratification  Moderate       6 Minute Walk: 6 Minute Walk    Row Name 01/05/19 1432         6 Minute Walk   Phase  Initial     Distance  263 feet     Walk Time  2 minutes     MPH  1.49     METS  0.31     RPE  13     Perceived Dyspnea   3     VO2 Peak  1.1     Symptoms  Yes (comment)     Comments  Patient stopped due to shortness of breath which she reports is increased when wearing a mask. SpO2 90% increased to 96%. Patient did not wish to resume test until after time had expired.     Resting HR  79 bpm     Resting BP  132/80     Exercise Oxygen Saturation  during 6 min walk  90 %     Max Ex. HR  84 bpm     Max Ex. BP  146/80     2 Minute Post BP  122/68        Oxygen Initial Assessment:   Oxygen Re-Evaluation:   Oxygen Discharge (Final Oxygen Re-Evaluation):   Initial Exercise Prescription: Initial Exercise Prescription - 01/05/19 1400      Date of Initial Exercise RX and Referring Provider   Date  01/05/19    Referring Provider  Nehemiah Massed      Treadmill   MPH  1    Grade  0    Minutes  15    METs  0.5      Recumbant Bike   Level  1    RPM  50    Watts  25    Minutes  15    METs  1      NuStep   Level  1    SPM  50    Minutes  15    METs  1      Arm Ergometer   Level  1    Watts  10    RPM  10    Minutes  15    METs  1      Biostep-RELP   Level  1    SPM  30    Minutes  15    METs  1      Prescription Details   Frequency (times per week)  3-5    Duration  Progress to 30 minutes of continuous aerobic without signs/symptoms of physical distress      Intensity    THRR 40-80% of Max Heartrate  103-127    Ratings of Perceived Exertion  11-13    Perceived Dyspnea  0-4      Progression   Progression  Continue progressive overload as per policy without signs/symptoms or physical distress.      Resistance Training   Training Prescription  Yes    Weight  2    Reps  10-15       Perform Capillary Blood Glucose checks as needed.  Exercise Prescription Changes: Exercise Prescription Changes    Row Name 01/16/19 1100 02/02/19 1500 02/21/19 1400         Response to Exercise   Blood Pressure (Admit)  130/70  142/80  134/70     Blood Pressure (Exercise)  124/70  150/80  146/78     Blood Pressure (Exit)  126/70  124/78  136/78     Heart Rate (Admit)  86 bpm  94 bpm  85 bpm     Heart Rate (Exercise)  108 bpm  117 bpm  115 bpm     Heart Rate (Exit)  93 bpm  85 bpm  85 bpm     Rating of Perceived Exertion (Exercise)  '11  13  15     '$ Symptoms  none  none  none     Duration  Continue with 30 min of aerobic exercise without signs/symptoms of physical distress.  Continue with 30 min of aerobic exercise without signs/symptoms of physical distress.  Continue with 30 min of aerobic exercise without signs/symptoms of physical distress.     Intensity  THRR unchanged  THRR unchanged  THRR unchanged       Progression   Progression  Continue to progress workloads to maintain intensity without signs/symptoms of physical distress.  Continue to progress workloads to maintain intensity without signs/symptoms of physical distress.  Continue to progress workloads to maintain intensity without signs/symptoms of physical distress.     Average METs  1.53  1.47  1.73       Resistance Training   Training Prescription  Yes  Yes  Yes     Weight  3 lbs  3 lbs  3 lbs     Reps  10-15  10-15  10-15       Interval Training   Interval Training  No  No  No       Treadmill   MPH  1  0.8  -     Grade  0  0  -     Minutes  15  15  -     METs  1.77  1.7  -       NuStep   Level   '1  1  1     '$ Minutes  '15  15  15     '$ METs  1.3  1.7  1.2       Arm Ergometer   Level  -  1  1     Minutes  -  15  15     METs  -  -  2       Biostep-RELP   Level  -  1  1     Minutes  -  15  15     METs  -  1  2        Exercise Comments:   Exercise Goals and Review: Exercise Goals    Row Name 01/05/19 1441             Exercise Goals   Increase Physical Activity  Yes       Intervention  Provide advice, education, support and counseling about physical activity/exercise needs.;Develop an individualized exercise prescription for aerobic and resistive training based on initial evaluation findings, risk stratification, comorbidities and participant's personal goals.       Expected Outcomes  Short Term: Attend rehab on a regular basis to increase amount of physical activity.;Long Term: Add in home exercise to make exercise part of routine and to increase amount of physical activity.;Long Term: Exercising regularly at least 3-5 days a week.       Increase Strength and Stamina  Yes       Intervention  Provide advice, education, support and counseling about physical activity/exercise needs.;Develop an individualized exercise prescription for aerobic and resistive training based on initial evaluation findings, risk stratification, comorbidities and participant's personal goals.       Expected Outcomes  Short Term: Increase workloads from initial exercise prescription for resistance, speed, and METs.;Short Term: Perform resistance training exercises routinely during rehab and add in resistance training at home;Long Term: Improve cardiorespiratory fitness, muscular endurance and strength as measured by increased METs and functional capacity (6MWT)       Able to understand and use rate of perceived exertion (RPE) scale  Yes       Intervention  Provide education and explanation on how to use RPE scale       Expected Outcomes  Short Term: Able to use RPE daily in rehab to express subjective  intensity level;Long Term:  Able to use RPE to guide intensity level when exercising independently       Intervention  Provide education and explanation on how to use Dyspnea scale       Expected Outcomes  Short Term: Able to use Dyspnea scale daily in rehab to express subjective sense of shortness of breath during exertion;Long Term: Able to use Dyspnea scale to guide intensity level when exercising independently       Intervention  Provide education, explanation, and written materials on patient's individual exercise prescription          Exercise Goals Re-Evaluation : Exercise Goals Re-Evaluation    Row Name 01/09/19 1440 01/16/19 1149 02/02/19 1501 02/13/19 1522 02/21/19 1431     Exercise Goal Re-Evaluation   Exercise Goals Review  Able to understand and use rate of perceived exertion (RPE) scale;Increase Physical Activity;Knowledge and understanding of Target Heart Rate Range (THRR);Understanding of Exercise Prescription;Increase Strength and Stamina;Able to check pulse independently  Increase Physical Activity;Increase Strength and Stamina;Understanding of Exercise Prescription  Increase Physical Activity;Increase Strength and Stamina;Understanding of Exercise Prescription  Increase Physical Activity;Increase Strength and Stamina;Understanding of Exercise Prescription  Increase Physical Activity;Increase Strength and Stamina;Understanding of Exercise Prescription   Comments  Reviewed RPE scale, THR and program prescription with pt today.  Pt voiced understanding and was given a copy of goals to take home.  Jacqlyn Larsen is off to a good start in rehab.  She has completed two full days of exercise.  We will continue to monitor her progress.  Jacqlyn Larsen has been doing well in rehab.  She was sent to  ED and admitted on Monday after exercise as she was very SOB and sats kept dropping.  She will need clearance to return to rehab.  Today was [name]'s first day back to heart track after being hospitalized for heart  failure. She tolerated exercise well today and her blood pressure, heart rate, and oxygen levels were all within acceptable ranges. [name]'s SOB was much better today.  She started out easy and plans to come consistantly and build back strength.  Jacqlyn Larsen is doing well in rehab.  She was able to return and pick back up where she was previously.  She is up to 2 METs on the arm crank and BioStep.  We will move her up at the next visit and continue to monitor her progress.   Expected Outcomes  Short: Use RPE daily to regulate intensity. Long: Follow program prescription in THR.  Short: Continue to attend regularly Long: Continue to follow program prescription.  Short: Cleared to return to rehab.  Long: Continue to increase stamina.  Short: Start attending heart track regularly and weigh every day to help manage heart failure. Long: as tolerated, incrementally increase workloads to work back to previous levels.  Short: Increase BioStep.  Long: Continue to increase stamina.      Discharge Exercise Prescription (Final Exercise Prescription Changes): Exercise Prescription Changes - 02/21/19 1400      Response to Exercise   Blood Pressure (Admit)  134/70    Blood Pressure (Exercise)  146/78    Blood Pressure (Exit)  136/78    Heart Rate (Admit)  85 bpm    Heart Rate (Exercise)  115 bpm    Heart Rate (Exit)  85 bpm    Rating of Perceived Exertion (Exercise)  15    Symptoms  none    Duration  Continue with 30 min of aerobic exercise without signs/symptoms of physical distress.    Intensity  THRR unchanged      Progression   Progression  Continue to progress workloads to maintain intensity without signs/symptoms of physical distress.    Average METs  1.73      Resistance Training   Training Prescription  Yes    Weight  3 lbs    Reps  10-15      Interval Training   Interval Training  No      NuStep   Level  1    Minutes  15    METs  1.2      Arm Ergometer   Level  1    Minutes  15    METs  2       Biostep-RELP   Level  1    Minutes  15    METs  2       Nutrition:  Target Goals: Understanding of nutrition guidelines, daily intake of sodium '1500mg'$ , cholesterol '200mg'$ , calories 30% from fat and 7% or less from saturated fats, daily to have 5 or more servings of fruits and vegetables.  Biometrics: Pre Biometrics - 01/05/19 1436      Pre Biometrics   Height  '5\' 2"'$  (1.575 m)    Weight  146 lb (66.2 kg)    BMI (Calculated)  26.7    Single Leg Stand  2.65 seconds        Nutrition Therapy Plan and Nutrition Goals: Nutrition Therapy & Goals - 01/05/19 1328      Nutrition Therapy   Diet  Low Na, HH diet    Protein (specify units)  55g  Fiber  25 grams    Whole Grain Foods  3 servings    Saturated Fats  12 max. grams    Fruits and Vegetables  5 servings/day    Sodium  1.5 grams      Personal Nutrition Goals   Nutrition Goal  ST: wants to think over everything LT: eat healthier overall    Comments  Pt B: black coffee, oatmeal, toast and smart balance. L: sandwich (white wheat) - tomato with mayo, salt/pepper. D: meat/chicken, vegetables, potatoes. (will switch between brown and white rice), whole wheat pasta, does not like whole wheat bread (suggested bread brands I like to try). Pt will have 1 diet pepsi and snack on chips and regular popcorn (talked about switching to poppers instead of chips (pt asked). Pt will have small ice cream or dark chocolate kisses. Pt drinks almond milk (dairy like feta can bother her), pt will use 1% milk in cooking, and will sometimes have whole eggs as part of her meal. Pt also reports sice quarentine going to [employer] 3x per week for a meal. Discussed HH eating and some meal ideas (pt doesn't know about what to do for lunch).      Intervention Plan   Intervention  Prescribe, educate and counsel regarding individualized specific dietary modifications aiming towards targeted core components such as weight, hypertension, lipid management,  diabetes, heart failure and other comorbidities.;Nutrition handout(s) given to patient.    Expected Outcomes  Short Term Goal: Understand basic principles of dietary content, such as calories, fat, sodium, cholesterol and nutrients.;Short Term Goal: A plan has been developed with personal nutrition goals set during dietitian appointment.;Long Term Goal: Adherence to prescribed nutrition plan.       Nutrition Assessments: Nutrition Assessments - 01/05/19 1506      MEDFICTS Scores   Pre Score  28       Nutrition Goals Re-Evaluation: Nutrition Goals Re-Evaluation    Row Name 02/15/19 1451             Goals   Nutrition Goal  ST: limit Na intake and read labels LT: eat healthier overall       Comment  Discussed HH and low Na eating again due to recent hospitalization. Pt reports asking for things without salt at restaurants and buys lower sodium [place] del[employer] slices. Gave pt lower sodium handout. Discussed making own cereal with cheaper whole grain cereal seeds or nuts and dried fruit because was going to buy expensive one.       Expected Outcome  ST: limit Na intake and read labels LT: eat healthier overall          Nutrition Goals Discharge (Final Nutrition Goals Re-Evaluation): Nutrition Goals Re-Evaluation - 02/15/19 1451      Goals   Nutrition Goal  ST: limit Na intake and read labels LT: eat healthier overall    Comment  Discussed HH and low Na eating again due to recent hospitalization. Pt reports asking for things without salt at restaurants and buys lower sodium [place] del[employer] slices. Gave pt lower sodium handout. Discussed making own cereal with cheaper whole grain cereal seeds or nuts and dried fruit because was going to buy expensive one.    Expected Outcome  ST: limit Na intake and read labels LT: eat healthier overall       Psychosocial: Target Goals: Acknowledge presence or absence of significant depression and/or stress, maximize coping skills, provide positive support  system. Participant is able to verbalize types and ability to  use techniques and skills needed for reducing stress and depression.   Initial Review & Psychosocial Screening: Initial Psych Review & Screening - 01/02/19 1554      Initial Review   Current issues with  Current Sleep Concerns;Current Stress Concerns    Comments  Jacqlyn Larsen is having issues sleeping since her MI, she is hoping it will get better in the next week. She is handling the MI well and has a good support system, but this has definitely been a change in their everyday life.      Family Dynamics   Good Support System?  Yes      Barriers   Psychosocial barriers to participate in program  There are no identifiable barriers or psychosocial needs.      Screening Interventions   Interventions  Encouraged to exercise    Expected Outcomes  Short Term goal: Utilizing psychosocial counselor, staff and physician to assist with identification of specific Stressors or current issues interfering with healing process. Setting desired goal for each stressor or current issue identified.;Long Term Goal: Stressors or current issues are controlled or eliminated.;Short Term goal: Identification and review with participant of any Quality of Life or Depression concerns found by scoring the questionnaire.;Long Term goal: The participant improves quality of Life and PHQ9 Scores as seen by post scores and/or verbalization of changes       Quality of Life Scores:  Quality of Life - 01/05/19 1357      Quality of Life   Select  Quality of Life      Quality of Life Scores   Health/Function Pre  18.83 %    Socioeconomic Pre  28.38 %    Psych/Spiritual Pre  30 %    Family Pre  27.6 %    GLOBAL Pre  24.5 %      Scores of 19 and below usually indicate a poorer quality of life in these areas.  A difference of  2-3 points is a clinically meaningful difference.  A difference of 2-3 points in the total score of the Quality of Life Index has been  associated with significant improvement in overall quality of life, self-image, physical symptoms, and general health in studies assessing change in quality of life.  PHQ-9: Recent Review Flowsheet Data    Depression screen [facility] 2/9 01/05/2019 04/12/2018 02/23/2017 02/19/2016 08/14/2015   Decreased Interest 1 0 0 0 0   Down, Depressed, Hopeless - 0 0 0 0   PHQ - 2 Score 1 0 0 0 0   Altered sleeping 0 - - - -   Tired, decreased energy 1 - - - -   Change in appetite 1 - - - -   Feeling bad or failure about yourself  0 - - - -   Trouble concentrating 1 - - - -   Moving slowly or fidgety/restless 0 - - - -   Suicidal thoughts 0 - - - -   PHQ-9 Score 4 - - - -   Difficult doing work/chores Not difficult at all - - - -     Interpretation of Total Score  Total Score Depression Severity:  1-4 = Minimal depression, 5-9 = Mild depression, 10-14 = Moderate depression, 15-19 = Moderately severe depression, 20-27 = Severe depression   Psychosocial Evaluation and Intervention:   Psychosocial Re-Evaluation: Psychosocial Re-Evaluation    [place] Name 02/13/19 1514             Psychosocial Re-Evaluation   Current issues with  Current Stress Concerns       Comments  Patient stated that she was overwhelmed with all the information she was given while in the hospital. She talked to her doctor about this and he reasurred her she was doing everything she was supposed to be doing and she stated she felt much better emotionally after that. She is sleeping well, but still having to use 2 pillows to help with breathing. Patient does not report any major depression or anxiety symptoms.       Expected Outcomes  Short: continue to take all medications and attend heart track classes. Long: Continue to maintain and manage mental health and good sleep habits.       Interventions  Encouraged to attend Cardiac Rehabilitation for the exercise       Continue Psychosocial Services   Follow up required by staff           Psychosocial Discharge (Final Psychosocial Re-Evaluation): Psychosocial Re-Evaluation - 02/13/19 1514      Psychosocial Re-Evaluation   Current issues with  Current Stress Concerns    Comments  Patient stated that she was overwhelmed with all the information she was given while in the hospital. She talked to her doctor about this and he reasurred her she was doing everything she was supposed to be doing and she stated she felt much better emotionally after that. She is sleeping well, but still having to use 2 pillows to help with breathing. Patient does not report any major depression or anxiety symptoms.    Expected Outcomes  Short: continue to take all medications and attend heart track classes. Long: Continue to maintain and manage mental health and good sleep habits.    Interventions  Encouraged to attend Cardiac Rehabilitation for the exercise    Continue Psychosocial Services   Follow up required by staff       Vocational Rehabilitation: Provide vocational rehab assistance to qualifying candidates.   Vocational Rehab Evaluation & Intervention:   Education: Education Goals: Education classes will be provided on a variety of topics geared toward better understanding of heart health and risk factor modification. Participant will state understanding/return demonstration of topics presented as noted by education test scores.  Learning Barriers/Preferences: Learning Barriers/Preferences - 01/02/19 1550      Learning Barriers/Preferences   Learning Barriers  Sight   macular degeneration   Learning Preferences  None       Education Topics:  AED/CPR: - Group verbal and written instruction with the use of models to demonstrate the basic use of the AED with the basic ABC's of resuscitation.   General Nutrition Guidelines/Fats and Fiber: -Group instruction provided by verbal, written material, models and posters to present the general guidelines for heart healthy nutrition.  Gives an explanation and review of dietary fats and fiber.   Controlling Sodium/Reading Food Labels: -Group verbal and written material supporting the discussion of sodium use in heart healthy nutrition. Review and explanation with models, verbal and written materials for utilization of the food label.   Exercise Physiology & General Exercise Guidelines: - Group verbal and written instruction with models to review the exercise physiology of the cardiovascular system and associated critical values. Provides general exercise guidelines with specific guidelines to those with heart or lung disease.    Aerobic Exercise & Resistance Training: - Gives group verbal and written instruction on the various components of exercise. Focuses on aerobic and resistive training programs and the benefits of this training and how to safely progress  through these programs..   Flexibility, Balance, Mind/Body Relaxation: Provides group verbal/written instruction on the benefits of flexibility and balance training, including mind/body exercise modes such as yoga, pilates and tai chi.  Demonstration and skill practice provided.   Stress and Anxiety: - Provides group verbal and written instruction about the health risks of elevated stress and causes of high stress.  Discuss the correlation between heart/lung disease and anxiety and treatment options. Review healthy ways to manage with stress and anxiety.   Depression: - Provides group verbal and written instruction on the correlation between heart/lung disease and depressed mood, treatment options, and the stigmas associated with seeking treatment.   Anatomy & Physiology of the Heart: - Group verbal and written instruction and models provide basic cardiac anatomy and physiology, with the coronary electrical and arterial systems. Review of Valvular disease and Heart Failure   Cardiac Procedures: - Group verbal and written instruction to review commonly  prescribed medications for heart disease. Reviews the medication, class of the drug, and side effects. Includes the steps to properly store meds and maintain the prescription regimen. (beta blockers and nitrates)   Cardiac Medications I: - Group verbal and written instruction to review commonly prescribed medications for heart disease. Reviews the medication, class of the drug, and side effects. Includes the steps to properly store meds and maintain the prescription regimen.   Cardiac Medications II: -Group verbal and written instruction to review commonly prescribed medications for heart disease. Reviews the medication, class of the drug, and side effects. (all other drug classes)    Go Sex-Intimacy & Heart Disease, Get SMART - Goal Setting: - Group verbal and written instruction through game format to discuss heart disease and the return to sexual intimacy. Provides group verbal and written material to discuss and apply goal setting through the application of the S.M.A.R.T. Method.   Other Matters of the Heart: - Provides group verbal, written materials and models to describe Stable Angina and Peripheral Artery. Includes description of the disease process and treatment options available to the cardiac patient.   Exercise & Equipment Safety: - Individual verbal instruction and demonstration of equipment use and safety with use of the equipment.   Cardiac Rehab from 01/05/2019 in [facility] Cardiac and Pulmonary Rehab  Date  01/05/19  Educator  [place]  Instruction Review Code  1- Verbalizes Understanding      Infection Prevention: - Provides verbal and written material to individual with discussion of infection control including proper hand washing and proper equipment cleaning during exercise session.   Cardiac Rehab from 01/05/2019 in [facility] Cardiac and Pulmonary Rehab  Date  01/05/19  Educator  [place]  Instruction Review Code  1- Verbalizes Understanding      Falls Prevention: - Provides  verbal and written material to individual with discussion of falls prevention and safety.   Cardiac Rehab from 01/05/2019 in [facility] Cardiac and Pulmonary Rehab  Date  01/05/19  Educator  [place]  Instruction Review Code  1- Verbalizes Understanding      Diabetes: - Individual verbal and written instruction to review signs/symptoms of diabetes, desired ranges of glucose level fasting, after meals and with exercise. Acknowledge that pre and post exercise glucose checks will be done for 3 sessions at entry of program.   Know Your Numbers and Risk Factors: -Group verbal and written instruction about important numbers in your health.  Discussion of what are risk factors and how they play a role in the disease process.  Review of Cholesterol, Blood Pressure,  Diabetes, and BMI and the role they play in your overall health.   Sleep Hygiene: -Provides group verbal and written instruction about how sleep can affect your health.  Define sleep hygiene, discuss sleep cycles and impact of sleep habits. Review good sleep hygiene tips.    Other: -Provides group and verbal instruction on various topics (see comments)   Knowledge Questionnaire Score: Knowledge Questionnaire Score - 01/05/19 1357      Knowledge Questionnaire Score   Pre Score  25/26 (Exercise question missed)  reviewed correst response with Wells Guiles today. She verbalized understanding of the response.       Core Components/Risk Factors/Patient Goals at Admission: Personal Goals and Risk Factors at Admission - 01/02/19 1549      Core Components/Risk Factors/Patient Goals on Admission   Hypertension  Yes    Intervention  Provide education on lifestyle modifcations including regular physical activity/exercise, weight management, moderate sodium restriction and increased consumption of fresh fruit, vegetables, and low fat dairy, alcohol moderation, and smoking cessation.;Monitor prescription use compliance.    Expected Outcomes  Short Term:  Continued assessment and intervention until BP is < 140/39m HG in hypertensive participants. < 130/862mHG in hypertensive participants with diabetes, heart failure or chronic kidney disease.;Long Term: Maintenance of blood pressure at goal levels.    Lipids  Yes    Intervention  Provide education and support for participant on nutrition & aerobic/resistive exercise along with prescribed medications to achieve LDL '70mg'$ , HDL >'40mg'$ .    Expected Outcomes  Short Term: Participant states understanding of desired cholesterol values and is compliant with medications prescribed. Participant is following exercise prescription and nutrition guidelines.;Long Term: Cholesterol controlled with medications as prescribed, with individualized exercise RX and with personalized nutrition plan. Value goals: LDL < '70mg'$ , HDL > 40 mg.       Core Components/Risk Factors/Patient Goals Review:  Goals and Risk Factor Review    Row Name 02/13/19 1517             Core Components/Risk Factors/Patient Goals Review   Personal Goals Review  Hypertension;Lipids;Heart Failure       Review  Patient attended heart track for the first time after being hospitalized for heart failure. She is currently stable and taking all medications to manage fluid and blood pressure.       Expected Outcomes  Short: continue to take new meds prescribed to manage fluid levels and blood pressure. Weigh every day. Start attending heart track regularly now that she had been cleared to return. Long: Manage heart failure and overall cardiac health with medication management and heart healthy diet and exercise.          Core Components/Risk Factors/Patient Goals at Discharge (Final Review):  Goals and Risk Factor Review - 02/13/19 1517      Core Components/Risk Factors/Patient Goals Review   Personal Goals Review  Hypertension;Lipids;Heart Failure    Review  Patient attended heart track for the first time after being hospitalized for heart  failure. She is currently stable and taking all medications to manage fluid and blood pressure.    Expected Outcomes  Short: continue to take new meds prescribed to manage fluid levels and blood pressure. Weigh every day. Start attending heart track regularly now that she had been cleared to return. Long: Manage heart failure and overall cardiac health with medication management and heart healthy diet and exercise.       ITP Comments: ITP Comments    Row Name 01/02/19 1556 01/09/19 1440 01/25/19 [id number]  02/22/19 0635     ITP Comments  Virtual orientation completed. Diagnosis can be found in [facility] 7/14. EP/RD orientation scheduled for 7/23 at pm  First full day of exercise!  Patient was oriented to gym and equipment including functions, settings, policies, and procedures.  Patient's individual exercise prescription and treatment plan were reviewed.  All starting workloads were established based on the results of the 6 minute walk test done at initial orientation visit.  The plan for exercise progression was also introduced and progression will be customized based on patient's performance and goals.  30 Day Review Completed today. Continue with ITP unless changed by Medical Director review.  New to program  30 Day review. Continue with ITP unless directed changes per Medical Director review.       Comments:

## 2019-02-22 NOTE — Progress Notes (Signed)
Daily Session Note  Patient Details  Name: Teresa Moyer MRN: [id number] Date of Birth: September 09, 1935 Referring Provider:     Cardiac Rehab from 01/05/2019 in [facility] Cardiac and Pulmonary Rehab  Referring Provider  Nehemiah Massed      Encounter Date: 02/22/2019  Check In: Session Check In - 02/22/19 1432      Check-In   Supervising physician immediately available to respond to emergencies  See telemetry face sheet for immediately available ER MD    Location  [facility]-Cardiac & Pulmonary Rehab    Staff Present  Renita Papa, RN Vickki Hearing, BA, ACSM CEP, Exercise Physiologist;Joseph Hood RCP,RRT,BSRT;Melissa Caiola RDN, LDN    Virtual Visit  No    Medication changes reported      Yes    Comments  additional eye injections    Fall or balance concerns reported     No    Warm-up and Cool-down  Performed on first and last piece of equipment    Resistance Training Performed  Yes    VAD Patient?  No    PAD/SET Patient?  No      Pain Assessment   Currently in Pain?  No/denies          Social History   Tobacco Use  Smoking Status Never Smoker  Smokeless Tobacco Never Used    Goals Met:  Independence with exercise equipment Exercise tolerated well No report of cardiac concerns or symptoms Strength training completed today  Goals Unmet:  Not Applicable  Comments: Pt able to follow exercise prescription today without complaint.  Will continue to monitor for progression.    Dr. Emily Filbert is Medical Director for [place] and [name].

## 2019-02-23 ENCOUNTER — Encounter: Payer: Medicare Other | Admitting: *Deleted

## 2019-02-23 DIAGNOSIS — I213 ST elevation (STEMI) myocardial infarction of unspecified site: Secondary | ICD-10-CM | POA: Diagnosis not present

## 2019-02-23 NOTE — Progress Notes (Signed)
Daily Session Note  Patient Details  Name: Teresa Moyer MRN: [id number] Date of Birth: 1935/10/21 Referring Provider:     Cardiac Rehab from 01/05/2019 in [facility] Cardiac and Pulmonary Rehab  Referring Provider  Nehemiah Massed      Encounter Date: 02/23/2019  Check In: Session Check In - 02/23/19 1426      Check-In   Supervising physician immediately available to respond to emergencies  See telemetry face sheet for immediately available ER MD    Location  [facility]-Cardiac & Pulmonary Rehab    Staff Present  Renita Papa, RN BSN;Jessica Luan Pulling, MA, RCEP, CCRP, CCET;Joseph Kiester RCP,RRT,BSRT    Virtual Visit  No    Medication changes reported      No    Fall or balance concerns reported     No    Warm-up and Cool-down  Performed on first and last piece of equipment    Resistance Training Performed  Yes    VAD Patient?  No    PAD/SET Patient?  No      Pain Assessment   Currently in Pain?  No/denies          Social History   Tobacco Use  Smoking Status Never Smoker  Smokeless Tobacco Never Used    Goals Met:  Independence with exercise equipment Exercise tolerated well No report of cardiac concerns or symptoms Strength training completed today  Goals Unmet:  Not Applicable  Comments: Pt able to follow exercise prescription today without complaint.  Will continue to monitor for progression.    Dr. Emily Filbert is Medical Director for [place] and [name].

## 2019-02-27 ENCOUNTER — Encounter: Payer: Medicare Other | Admitting: *Deleted

## 2019-02-27 ENCOUNTER — Other Ambulatory Visit: Payer: Self-pay

## 2019-02-27 DIAGNOSIS — I213 ST elevation (STEMI) myocardial infarction of unspecified site: Secondary | ICD-10-CM | POA: Diagnosis not present

## 2019-02-27 NOTE — Progress Notes (Signed)
Daily Session Note  Patient Details  Name: Teresa Moyer MRN: [id number] Date of Birth: 1935/06/19 Referring Provider:     Cardiac Rehab from 01/05/2019 in [facility] Cardiac and Pulmonary Rehab  Referring Provider  Nehemiah Massed      Encounter Date: 02/27/2019  Check In: Session Check In - 02/27/19 1428      Check-In   Supervising physician immediately available to respond to emergencies  See telemetry face sheet for immediately available ER MD    Location  [facility]-Cardiac & Pulmonary Rehab    Staff Present  Renita Papa, RN BSN;Jessica Luan Pulling, MA, RCEP, CCRP, CCET;Joseph [place] RCP,RRT,BSRT    Virtual Visit  No    Medication changes reported      No    Fall or balance concerns reported     No    Warm-up and Cool-down  Performed on first and last piece of equipment    Resistance Training Performed  Yes    VAD Patient?  No    PAD/SET Patient?  No      Pain Assessment   Currently in Pain?  No/denies          Social History   Tobacco Use  Smoking Status Never Smoker  Smokeless Tobacco Never Used    Goals Met:  Independence with exercise equipment Exercise tolerated well No report of cardiac concerns or symptoms Strength training completed today  Goals Unmet:  Not Applicable  Comments: Pt able to follow exercise prescription today without complaint.  Will continue to monitor for progression.  Reviewed home exercise with pt today.  Pt plans to walk and use videos at home for exercise.  Reviewed THR, pulse, RPE, sign and symptoms, NTG use, and when to call 911 or MD.  Also discussed weather considerations and indoor options.  Pt voiced understanding.   Dr. Emily Filbert is Medical Director for [place] and [name].

## 2019-03-01 ENCOUNTER — Other Ambulatory Visit: Payer: Self-pay

## 2019-03-01 ENCOUNTER — Encounter: Payer: Medicare Other | Admitting: *Deleted

## 2019-03-01 DIAGNOSIS — I213 ST elevation (STEMI) myocardial infarction of unspecified site: Secondary | ICD-10-CM

## 2019-03-01 NOTE — Progress Notes (Signed)
Daily Session Note  Patient Details  Name: Teresa Moyer MRN: [id number] Date of Birth: 1936-01-17 Referring Provider:     Cardiac Rehab from 01/05/2019 in [facility] Cardiac and Pulmonary Rehab  Referring Provider  Nehemiah Massed      Encounter Date: 03/01/2019  Check In: Session Check In - 03/01/19 1452      Check-In   Supervising physician immediately available to respond to emergencies  See telemetry face sheet for immediately available ER MD    Location  [facility]-Cardiac & Pulmonary Rehab    Staff Present  Justin Mend RCP,RRT,BSRT;Amanda Oletta Darter, BA, ACSM CEP, Exercise Physiologist;Meredith Sherryll Burger, RN BSN    Virtual Visit  No    Medication changes reported      No    Fall or balance concerns reported     No    Warm-up and Cool-down  Performed on first and last piece of equipment    Resistance Training Performed  Yes    VAD Patient?  No    PAD/SET Patient?  No      Pain Assessment   Currently in Pain?  No/denies          Social History   Tobacco Use  Smoking Status Never Smoker  Smokeless Tobacco Never Used    Goals Met:  Independence with exercise equipment Exercise tolerated well Personal goals reviewed No report of cardiac concerns or symptoms Strength training completed today  Goals Unmet:  Not Applicable  Comments: Pt able to follow exercise prescription today without complaint.  Will continue to monitor for progression.    Dr. Emily Filbert is Medical Director for [place] and [name].

## 2019-03-02 DIAGNOSIS — H353211 Exudative age-related macular degeneration, right eye, with active choroidal neovascularization: Secondary | ICD-10-CM | POA: Diagnosis not present

## 2019-03-02 DIAGNOSIS — H353231 Exudative age-related macular degeneration, bilateral, with active choroidal neovascularization: Secondary | ICD-10-CM | POA: Diagnosis not present

## 2019-03-06 ENCOUNTER — Encounter: Payer: Medicare Other | Admitting: *Deleted

## 2019-03-06 ENCOUNTER — Other Ambulatory Visit: Payer: Self-pay

## 2019-03-06 DIAGNOSIS — I213 ST elevation (STEMI) myocardial infarction of unspecified site: Secondary | ICD-10-CM | POA: Diagnosis not present

## 2019-03-06 NOTE — Progress Notes (Signed)
Daily Session Note  Patient Details  Name: Teresa Moyer MRN: [id number] Date of Birth: 04-Apr-1936 Referring Provider:     Cardiac Rehab from 01/05/2019 in [facility] Cardiac and Pulmonary Rehab  Referring Provider  Nehemiah Massed      Encounter Date: 03/06/2019  Check In: Session Check In - 03/06/19 1425      Check-In   Supervising physician immediately available to respond to emergencies  See telemetry face sheet for immediately available ER MD    Location  [facility]-Cardiac & Pulmonary Rehab    Staff Present  Renita Papa, RN Moises Blood, BS, ACSM CEP, Exercise Physiologist;Joseph Tessie Fass RCP,RRT,BSRT    Virtual Visit  No    Medication changes reported      No    Fall or balance concerns reported     No    Warm-up and Cool-down  Performed on first and last piece of equipment    Resistance Training Performed  Yes    VAD Patient?  No    PAD/SET Patient?  No      Pain Assessment   Currently in Pain?  No/denies          Social History   Tobacco Use  Smoking Status Never Smoker  Smokeless Tobacco Never Used    Goals Met:  Independence with exercise equipment Exercise tolerated well No report of cardiac concerns or symptoms Strength training completed today  Goals Unmet:  Not Applicable  Comments: Pt able to follow exercise prescription today without complaint.  Will continue to monitor for progression.    Dr. Emily Filbert is Medical Director for [place] and [name].

## 2019-03-08 ENCOUNTER — Ambulatory Visit: Payer: Medicare Other | Admitting: Family Medicine

## 2019-03-08 ENCOUNTER — Other Ambulatory Visit: Payer: Self-pay

## 2019-03-08 ENCOUNTER — Encounter: Payer: Medicare Other | Admitting: *Deleted

## 2019-03-08 DIAGNOSIS — I213 ST elevation (STEMI) myocardial infarction of unspecified site: Secondary | ICD-10-CM | POA: Diagnosis not present

## 2019-03-08 NOTE — Progress Notes (Signed)
Daily Session Note  Patient Details  Name: Teresa Moyer MRN: [id number] Date of Birth: 11-09-1935 Referring Provider:     Cardiac Rehab from 01/05/2019 in [facility] Cardiac and Pulmonary Rehab  Referring Provider  Nehemiah Massed      Encounter Date: 03/08/2019  Check In: Session Check In - 03/08/19 1436      Check-In   Supervising physician immediately available to respond to emergencies  See telemetry face sheet for immediately available ER MD    Location  [facility]-Cardiac & Pulmonary Rehab    Staff Present  Renita Papa, RN Vickki Hearing, BA, ACSM CEP, Exercise Physiologist;Joseph Tessie Fass RCP,RRT,BSRT    Virtual Visit  No    Medication changes reported      No    Fall or balance concerns reported     No    Warm-up and Cool-down  Performed on first and last piece of equipment    Resistance Training Performed  Yes    VAD Patient?  No    PAD/SET Patient?  No      Pain Assessment   Currently in Pain?  No/denies          Social History   Tobacco Use  Smoking Status Never Smoker  Smokeless Tobacco Never Used    Goals Met:  Independence with exercise equipment Exercise tolerated well No report of cardiac concerns or symptoms Strength training completed today  Goals Unmet:  Not Applicable  Comments: Pt able to follow exercise prescription today without complaint.  Will continue to monitor for progression.    Dr. Emily Filbert is Medical Director for [place] and [name].

## 2019-03-09 ENCOUNTER — Encounter: Payer: Medicare Other | Admitting: *Deleted

## 2019-03-09 DIAGNOSIS — I213 ST elevation (STEMI) myocardial infarction of unspecified site: Secondary | ICD-10-CM | POA: Diagnosis not present

## 2019-03-09 NOTE — Progress Notes (Signed)
Daily Session Note  Patient Details  Name: Teresa Moyer MRN: [id number] Date of Birth: June 03, 1936 Referring Provider:     Cardiac Rehab from 01/05/2019 in [facility] Cardiac and Pulmonary Rehab  Referring Provider  Nehemiah Massed      Encounter Date: 03/09/2019  Check In: Session Check In - 03/09/19 1434      Check-In   Supervising physician immediately available to respond to emergencies  See telemetry face sheet for immediately available ER MD    Location  [facility]-Cardiac & Pulmonary Rehab    Staff Present  Renita Papa, RN BSN;Jessica Luan Pulling, MA, RCEP, CCRP, CCET;Jeanna Durrell BS, Exercise Physiologist    Virtual Visit  No    Medication changes reported      No    Fall or balance concerns reported     No    Warm-up and Cool-down  Performed on first and last piece of equipment    Resistance Training Performed  Yes    VAD Patient?  No    PAD/SET Patient?  No      Pain Assessment   Currently in Pain?  No/denies          Social History   Tobacco Use  Smoking Status Never Smoker  Smokeless Tobacco Never Used    Goals Met:  Independence with exercise equipment Exercise tolerated well No report of cardiac concerns or symptoms Strength training completed today  Goals Unmet:  Not Applicable  Comments: Pt able to follow exercise prescription today without complaint.  Will continue to monitor for progression.    Dr. Emily Filbert is Medical Director for [place] and [name].

## 2019-03-13 ENCOUNTER — Other Ambulatory Visit: Payer: Self-pay

## 2019-03-13 ENCOUNTER — Encounter: Payer: Medicare Other | Admitting: *Deleted

## 2019-03-13 DIAGNOSIS — I213 ST elevation (STEMI) myocardial infarction of unspecified site: Secondary | ICD-10-CM | POA: Diagnosis not present

## 2019-03-13 NOTE — Progress Notes (Signed)
Daily Session Note  Patient Details  Name: Teresa Moyer MRN: [id number] Date of Birth: 05/15/1936 Referring Provider:     Cardiac Rehab from 01/05/2019 in [facility] Cardiac and Pulmonary Rehab  Referring Provider  Nehemiah Massed      Encounter Date: 03/13/2019  Check In: Session Check In - 03/13/19 1434      Check-In   Supervising physician immediately available to respond to emergencies  See telemetry face sheet for immediately available ER MD    Location  [facility]-Cardiac & Pulmonary Rehab    Staff Present  Earlean Shawl, BS, ACSM CEP, Exercise Physiologist;Joseph Hood RCP,RRT,BSRT;Carroll Enterkin, RN, BSN-BC, CCRP    Virtual Visit  No    Medication changes reported      No    Fall or balance concerns reported     No    Warm-up and Cool-down  Performed on first and last piece of equipment    Resistance Training Performed  Yes    VAD Patient?  No    PAD/SET Patient?  No      Pain Assessment   Currently in Pain?  No/denies    Multiple Pain Sites  No          Social History   Tobacco Use  Smoking Status Never Smoker  Smokeless Tobacco Never Used    Goals Met:  Independence with exercise equipment Exercise tolerated well No report of cardiac concerns or symptoms Strength training completed today  Goals Unmet:  Not Applicable  Comments: Pt able to follow exercise prescription today without complaint.  Will continue to monitor for progression.    Dr. Emily Filbert is Medical Director for [place] and [name].

## 2019-03-15 ENCOUNTER — Other Ambulatory Visit: Payer: Self-pay

## 2019-03-15 ENCOUNTER — Encounter: Payer: Medicare Other | Admitting: *Deleted

## 2019-03-15 DIAGNOSIS — I213 ST elevation (STEMI) myocardial infarction of unspecified site: Secondary | ICD-10-CM

## 2019-03-15 NOTE — Progress Notes (Signed)
Daily Session Note  Patient Details  Name: Teresa Moyer MRN: [id number] Date of Birth: 09-Apr-1936 Referring Provider:     Cardiac Rehab from 01/05/2019 in [facility] Cardiac and Pulmonary Rehab  Referring Provider  Teresa Moyer      Encounter Date: 03/15/2019  Check In: Session Check In - 03/15/19 1431      Check-In   Supervising physician immediately available to respond to emergencies  See telemetry face sheet for immediately available ER MD    Location  [facility]-Cardiac & Pulmonary Rehab    Staff Present  Teresa Papa, RN BSN;Teresa Luan Pulling, MA, RCEP, CCRP, CCET;Teresa Moyer RCP,RRT,BSRT    Virtual Visit  No    Medication changes reported      No    Fall or balance concerns reported     No    Warm-up and Cool-down  Performed on first and last piece of equipment    Resistance Training Performed  Yes    VAD Patient?  No    PAD/SET Patient?  No      Pain Assessment   Currently in Pain?  No/denies          Social History   Tobacco Use  Smoking Status Never Smoker  Smokeless Tobacco Never Used    Goals Met:  Independence with exercise equipment Exercise tolerated well No report of cardiac concerns or symptoms Strength training completed today  Goals Unmet:  Not Applicable  Comments: Pt able to follow exercise prescription today without complaint.  Will continue to monitor for progression.    Dr. Emily Moyer is Medical Director for [name] and [name].

## 2019-03-16 ENCOUNTER — Encounter: Payer: Medicare Other | Attending: Internal Medicine | Admitting: *Deleted

## 2019-03-16 DIAGNOSIS — I213 ST elevation (STEMI) myocardial infarction of unspecified site: Secondary | ICD-10-CM | POA: Insufficient documentation

## 2019-03-16 NOTE — Progress Notes (Signed)
Daily Session Note  Patient Details  Name: KRYSTEENA STALKER MRN: [id number] Date of Birth: March 11, 1936 Referring Provider:     Cardiac Rehab from 01/05/2019 in [facility] Cardiac and Pulmonary Rehab  Referring Provider  Nehemiah Massed      Encounter Date: 03/16/2019  Check In: Session Check In - 03/16/19 1425      Check-In   Supervising physician immediately available to respond to emergencies  See telemetry face sheet for immediately available ER MD    Location  [facility]-Cardiac & Pulmonary Rehab    Staff Present  Renita Papa, RN BSN;Jessica Luan Pulling, MA, RCEP, CCRP, CCET;Joseph Valentine RCP,RRT,BSRT    Virtual Visit  No    Medication changes reported      No    Fall or balance concerns reported     No    Warm-up and Cool-down  Performed on first and last piece of equipment    Resistance Training Performed  Yes    VAD Patient?  No    PAD/SET Patient?  No      Pain Assessment   Currently in Pain?  No/denies          Social History   Tobacco Use  Smoking Status Never Smoker  Smokeless Tobacco Never Used    Goals Met:  Independence with exercise equipment Exercise tolerated well No report of cardiac concerns or symptoms Strength training completed today  Goals Unmet:  Not Applicable  Comments: Pt able to follow exercise prescription today without complaint.  Will continue to monitor for progression.    Dr. Emily Filbert is Medical Director for [place] and [name].

## 2019-03-20 ENCOUNTER — Other Ambulatory Visit: Payer: Self-pay | Admitting: Family Medicine

## 2019-03-20 MED ORDER — CARVEDILOL 6.25 MG PO TABS: 6.2500 mg | ORAL_TABLET | Freq: Two times a day (BID) | ORAL | 12 refills | Status: DC

## 2019-03-20 NOTE — Telephone Encounter (Signed)
[facility] faxed refill request for the following medications:  carvedilol (COREG) 6.25 MG tablet  The pharmacy marked this as urgent  Please advise.

## 2019-03-20 NOTE — Telephone Encounter (Signed)
Please review. Thanks!  

## 2019-03-21 ENCOUNTER — Encounter: Payer: Self-pay | Admitting: Family Medicine

## 2019-03-21 ENCOUNTER — Other Ambulatory Visit: Payer: Self-pay

## 2019-03-21 ENCOUNTER — Ambulatory Visit (INDEPENDENT_AMBULATORY_CARE_PROVIDER_SITE_OTHER): Payer: Medicare Other | Admitting: Family Medicine

## 2019-03-21 VITALS — BP 124/70 | HR 71 | Temp 97.3°F | Resp 16 | Ht 62.0 in | Wt 141.0 lb

## 2019-03-21 DIAGNOSIS — E782 Mixed hyperlipidemia: Secondary | ICD-10-CM

## 2019-03-21 DIAGNOSIS — I10 Essential (primary) hypertension: Secondary | ICD-10-CM | POA: Diagnosis not present

## 2019-03-21 DIAGNOSIS — I2102 ST elevation (STEMI) myocardial infarction involving left anterior descending coronary artery: Secondary | ICD-10-CM | POA: Diagnosis not present

## 2019-03-21 DIAGNOSIS — Z23 Encounter for immunization: Secondary | ICD-10-CM

## 2019-03-21 MED ORDER — CARVEDILOL 6.25 MG PO TABS: 6.2500 mg | ORAL_TABLET | Freq: Two times a day (BID) | ORAL | 3 refills | Status: DC

## 2019-03-21 NOTE — Patient Instructions (Addendum)
1. Essential (primary) hypertension  - carvedilol 6.25 mg bid  2. Need for influenza vaccination   - Fluad Quad(high Dose 65+)  - Follow-up 6 months for HTN

## 2019-03-21 NOTE — Progress Notes (Signed)
Patient: Teresa Moyer Female    DOB: 11-13-1935   83 y.o.   MRN: [id number] Visit Date: 03/21/2019  Today's Provider: [name], MD   Chief Complaint  Patient presents with  . Follow-up   Subjective:     HPI  Patient feeling well.  She has cardiology follow-up.  Asymptomatic. Essential (primary) hypertension From 02/07/2019-no changes. Home readings 106/58-136/69.    Allergies  Allergen Reactions  . Accupril [Quinapril Hcl] Hives and Swelling    SWELLING OF NOSE/FACE  . Sulfa Antibiotics Shortness Of Breath and Swelling  . Clinoril [Sulindac] Other (See Comments)    Unsure of reaction type  . Hydrochlorothiazide Other (See Comments)    Unsure of reaction type  . Tessalon [Benzonatate] Other (See Comments)    Unsure of reaction type  . Amoxicillin Rash    Has patient had a PCN reaction causing immediate rash, facial/tongue/throat swelling, SOB or lightheadedness with hypotension: Unknown Has patient had a PCN reaction causing severe rash involving mucus membranes or skin necrosis: Unknown Has patient had a PCN reaction that required hospitalization: No Has patient had a PCN reaction occurring within the last 10 years:Possibly unsure  If all of the above answers are "NO", then may proceed with Cephalosporin use.   . Codeine Sulfate Nausea And Vomiting  . Penicillins Rash    Has patient had a PCN reaction causing immediate rash, facial/tongue/throat swelling, SOB or lightheadedness with hypotension: Unknown Has patient had a PCN reaction causing severe rash involving mucus membranes or skin necrosis: Unknown Has patient had a PCN reaction that required hospitalization: No Has patient had a PCN reaction occurring within the last 10 years:Possibly unsure  If all of the above answers are "NO", then may proceed with Cephalosporin use.     Current Outpatient Medications:  .  acetaminophen (TYLENOL) 650 MG CR tablet, Take 650 mg by mouth every 8 (eight)  hours as needed for pain., Disp: , Rfl:  .  aspirin 81 MG chewable tablet, Chew 1 tablet (81 mg total) by mouth daily., Disp: 30 tablet, Rfl: 0 .  atorvastatin (LIPITOR) 40 MG tablet, Take 1 tablet (40 mg total) by mouth daily at 6 PM., Disp: 30 tablet, Rfl: 0 .  azelastine (ASTELIN) 0.1 % nasal spray, Place 1 spray into both nostrils at bedtime. , Disp: , Rfl:  .  Carboxymethylcell-Hypromellose (GENTEAL) 0.25-0.3 % GEL, Apply 1 application to eye at bedtime., Disp: , Rfl:  .  carvedilol (COREG) 6.25 MG tablet, Take 1 tablet (6.25 mg total) by mouth 2 (two) times daily with a meal., Disp: 60 tablet, Rfl: 12 .  cetirizine (ZYRTEC ALLERGY) 10 MG tablet, Take 10 mg by mouth at bedtime. , Disp: , Rfl:  .  clopidogrel (PLAVIX) 75 MG tablet, Take 1 tablet by mouth daily., Disp: , Rfl:  .  fluticasone (FLONASE) 50 MCG/ACT nasal spray, Place 1 spray into both nostrils daily. , Disp: , Rfl:  .  furosemide (LASIX) 20 MG tablet, Take 1 tablet (20 mg total) by mouth daily. Take 40 mg daily for 3 days,till Friday,after that resume 20 mg daily, Disp: 30 tablet, Rfl: 1 .  irbesartan (AVAPRO) 300 MG tablet, Take 0.5 tablets (150 mg total) by mouth daily., Disp: 30 tablet, Rfl: 0 .  Multiple Vitamins-Minerals (PRESERVISION AREDS PO), Take 1 tablet by mouth 2 (two) times daily. , Disp: , Rfl:  .  Nutritional Supplements (JOINT FORMULA PO), Take 1 capsule by mouth daily before breakfast.  Arthrozene, Disp: , Rfl:  .  omeprazole (PRILOSEC) 20 MG capsule, Take 1 capsule (20 mg total) by mouth daily., Disp: 90 capsule, Rfl: 3 .  Polyethyl Glycol-Propyl Glycol (SYSTANE) 0.4-0.3 % SOLN, Place 1 drop into both eyes 3 (three) times daily as needed (for dry eyes)., Disp: , Rfl:  .  potassium chloride (KLOR-CON 10) 10 MEQ tablet, Take 1 tablet (10 mEq total) by mouth daily., Disp: 30 tablet, Rfl: 0 .  Probiotic Product (ALIGN) 4 MG CAPS, Take 4 mg by mouth daily after lunch. , Disp: , Rfl:   Review of Systems  Constitutional:  Negative for appetite change, chills, fatigue and fever.  HENT: Negative.   Eyes: Negative.   Respiratory: Negative for chest tightness and shortness of breath.   Cardiovascular: Negative for chest pain and palpitations.  Gastrointestinal: Negative for abdominal pain, nausea and vomiting.  Allergic/Immunologic: Negative.   Neurological: Negative for dizziness and weakness.  Hematological: Negative.   Psychiatric/Behavioral: Negative.     Social History   Tobacco Use  . Smoking status: Never Smoker  . Smokeless tobacco: Never Used  Substance Use Topics  . Alcohol use: No      Objective:   BP 124/70 (BP Location: Right Arm, Patient Position: Sitting, Cuff Size: Large)   Pulse 71   Temp (!) 97.3 F (36.3 C) (Other (Comment))   Resp 16   Ht 5\' 2"  (1.575 m)   Wt 141 lb (64 kg)   SpO2 97%   BMI 25.79 kg/m  Vitals:   03/21/19 1531  BP: 124/70  Pulse: 71  Resp: 16  Temp: (!) 97.3 F (36.3 C)  TempSrc: Other (Comment)  SpO2: 97%  Weight: 141 lb (64 kg)  Height: 5\' 2"  (1.575 m)  Body mass index is 25.79 kg/m.   Physical Exam Vitals signs reviewed.  Constitutional:      Appearance: She is well-developed.  HENT:     Head: Normocephalic and atraumatic.     Right Ear: External ear normal.     Left Ear: External ear normal.     Nose: Nose normal.  Eyes:     Conjunctiva/sclera: Conjunctivae normal.     Pupils: Pupils are equal, round, and reactive to light.  Neck:     Musculoskeletal: Normal range of motion and neck supple.  Cardiovascular:     Rate and Rhythm: Normal rate and regular rhythm.     Heart sounds: Murmur (2/6 systolic murmur) present.     Comments: 2/6 early systolic murmur at RUSB. Pulmonary:     Effort: Pulmonary effort is normal.     Breath sounds: Normal breath sounds.  Abdominal:     General: There is no distension.     Palpations: Abdomen is soft.     Tenderness: There is no abdominal tenderness.  Musculoskeletal: Normal range of motion.   Skin:    General: Skin is warm and dry.  Neurological:     Mental Status: She is alert and oriented to person, place, and time.     Deep Tendon Reflexes: Reflexes are normal and symmetric.  Psychiatric:        Behavior: Behavior normal.        Thought Content: Thought content normal.        Judgment: Judgment normal.      No results found for any visits on 03/21/19.     Assessment & Plan    1. Need for influenza vaccination  - Flu Vaccine QUAD High Dose(Fluad)  2. STEMI  involving left anterior descending coronary artery [facility]) Follow-up with Dr. Nehemiah Massed.  3. Essential (primary) hypertension Controlled.  4. Mixed hyperlipidemia On Lipitor.  LDL 28.  Return to clinic 6 months     [name], MD  [facility]

## 2019-03-22 ENCOUNTER — Encounter: Payer: Self-pay | Admitting: *Deleted

## 2019-03-22 DIAGNOSIS — I213 ST elevation (STEMI) myocardial infarction of unspecified site: Secondary | ICD-10-CM

## 2019-03-22 NOTE — Progress Notes (Signed)
Cardiac Individual Treatment Plan  Patient Details  Name: Teresa Moyer MRN: [id number] Date of Birth: 08-23-1935 Referring Provider:     Cardiac Rehab from 01/05/2019 in [facility] ([place] Cardiac and Pulmonary Rehab  Referring Provider  Nehemiah Massed      Initial Encounter Date:    Cardiac Rehab from 01/05/2019 in [facility] Cardiac and Pulmonary Rehab  Date  01/05/19      Visit Diagnosis: ST elevation myocardial infarction (STEMI), unspecified artery ([place])  Patient's Home Medications on Admission:  Current Outpatient Medications:  .  acetaminophen (TYLENOL) 650 MG CR tablet, Take 650 mg by mouth every 8 (eight) hours as needed for pain., Disp: , Rfl:  .  aspirin 81 MG chewable tablet, Chew 1 tablet (81 mg total) by mouth daily., Disp: 30 tablet, Rfl: 0 .  atorvastatin (LIPITOR) 40 MG tablet, Take 1 tablet (40 mg total) by mouth daily at 6 PM., Disp: 30 tablet, Rfl: 0 .  azelastine (ASTELIN) 0.1 % nasal spray, Place 1 spray into both nostrils at bedtime. , Disp: , Rfl:  .  Carboxymethylcell-Hypromellose (GENTEAL) 0.25-0.3 % GEL, Apply 1 application to eye at bedtime., Disp: , Rfl:  .  carvedilol (COREG) 6.25 MG tablet, Take 1 tablet (6.25 mg total) by mouth 2 (two) times daily with a meal., Disp: 180 tablet, Rfl: 3 .  cetirizine (ZYRTEC ALLERGY) 10 MG tablet, Take 10 mg by mouth at bedtime. , Disp: , Rfl:  .  clopidogrel (PLAVIX) 75 MG tablet, Take 1 tablet by mouth daily., Disp: , Rfl:  .  fluticasone (FLONASE) 50 MCG/ACT nasal spray, Place 1 spray into both nostrils daily. , Disp: , Rfl:  .  furosemide (LASIX) 20 MG tablet, Take 1 tablet (20 mg total) by mouth daily. Take 40 mg daily for 3 days,till Friday,after that resume 20 mg daily, Disp: 30 tablet, Rfl: 1 .  irbesartan (AVAPRO) 300 MG tablet, Take 0.5 tablets (150 mg total) by mouth daily., Disp: 30 tablet, Rfl: 0 .  Multiple Vitamins-Minerals (PRESERVISION AREDS PO), Take 1 tablet by mouth 2 (two) times daily. , Disp: , Rfl:  .  Nutritional  Supplements (JOINT FORMULA PO), Take 1 capsule by mouth daily before breakfast. Arthrozene, Disp: , Rfl:  .  omeprazole (PRILOSEC) 20 MG capsule, Take 1 capsule (20 mg total) by mouth daily., Disp: 90 capsule, Rfl: 3 .  Polyethyl Glycol-Propyl Glycol (SYSTANE) 0.4-0.3 % SOLN, Place 1 drop into both eyes 3 (three) times daily as needed (for dry eyes)., Disp: , Rfl:  .  potassium chloride (KLOR-CON 10) 10 MEQ tablet, Take 1 tablet (10 mEq total) by mouth daily., Disp: 30 tablet, Rfl: 0 .  Probiotic Product (ALIGN) 4 MG CAPS, Take 4 mg by mouth daily after lunch. , Disp: , Rfl:   Past Medical History: Past Medical History:  Diagnosis Date  . Cataracts, bilateral   . Environmental allergies   . GERD (gastroesophageal reflux disease)   . Heart murmur 2019   had since a child. dr. Nehemiah Massed follows d/t getting louder  . Hemorrhoids   . History of chickenpox   . Hypertension   . Incontinence in female   . Osteoarthritis   . Skin cancer 12/2017   BCC. nose removed from nose last week.  shoulder squamos cell removed previously  . Type O blood, Rh negative 12/2017   patient requests that this be present on her chart    Tobacco Use: Social History   Tobacco Use  Smoking Status Never Smoker  Smokeless Tobacco Never  Used    Labs: Recent Chemical engineer    Labs for ITP Cardiac and Pulmonary Rehab Latest Ref Rng & Units 09/01/2016 07/01/2017 11/18/2018 12/28/2018 01/11/2019   Cholestrol 100 - 199 mg/dL 171 167 163 - 112   LDLCALC 0 - 99 mg/dL 66 61 61 - 28   HDL >39 mg/dL 74 66 77 - 61   Trlycerides 0 - 149 mg/dL 157(H) 199(H) 127 - 113   Hemoglobin A1c 4.8 - 5.6 % - - - 5.8(H) -       Exercise Target Goals: Exercise Program Goal: Individual exercise prescription set using results from initial 6 min walk test and THRR while considering  patient's activity barriers and safety.   Exercise Prescription Goal: Initial exercise prescription builds to 30-45 minutes a day of aerobic  activity, 2-3 days per week.  Home exercise guidelines will be given to patient during program as part of exercise prescription that the participant will acknowledge.  Activity Barriers & Risk Stratification: Activity Barriers & Cardiac Risk Stratification - 01/02/19 1553      Activity Barriers & Cardiac Risk Stratification   Activity Barriers  Arthritis    Cardiac Risk Stratification  Moderate       6 Minute Walk: 6 Minute Walk    Row Name 01/05/19 1432         6 Minute Walk   Phase  Initial     Distance  263 feet     Walk Time  2 minutes     MPH  1.49     METS  0.31     RPE  13     Perceived Dyspnea   3     VO2 Peak  1.1     Symptoms  Yes (comment)     Comments  Patient stopped due to shortness of breath which she reports is increased when wearing a mask. SpO2 90% increased to 96%. Patient did not wish to resume test until after time had expired.     Resting HR  79 bpm     Resting BP  132/80     Exercise Oxygen Saturation  during 6 min walk  90 %     Max Ex. HR  84 bpm     Max Ex. BP  146/80     2 Minute Post BP  122/68        Oxygen Initial Assessment:   Oxygen Re-Evaluation:   Oxygen Discharge (Final Oxygen Re-Evaluation):   Initial Exercise Prescription: Initial Exercise Prescription - 01/05/19 1400      Date of Initial Exercise RX and Referring Provider   Date  01/05/19    Referring Provider  Nehemiah Massed      Treadmill   MPH  1    Grade  0    Minutes  15    METs  0.5      Recumbant Bike   Level  1    RPM  50    Watts  25    Minutes  15    METs  1      NuStep   Level  1    SPM  50    Minutes  15    METs  1      Arm Ergometer   Level  1    Watts  10    RPM  10    Minutes  15    METs  1      Biostep-RELP   Level  1    SPM  30    Minutes  15    METs  1      Prescription Details   Frequency (times per week)  3-5    Duration  Progress to 30 minutes of continuous aerobic without signs/symptoms of physical distress      Intensity    THRR 40-80% of Max Heartrate  103-127    Ratings of Perceived Exertion  11-13    Perceived Dyspnea  0-4      Progression   Progression  Continue progressive overload as per policy without signs/symptoms or physical distress.      Resistance Training   Training Prescription  Yes    Weight  2    Reps  10-15       Perform Capillary Blood Glucose checks as needed.  Exercise Prescription Changes: Exercise Prescription Changes    Row Name 01/16/19 1100 02/02/19 1500 02/21/19 1400 02/27/19 1500 03/09/19 1400     Response to Exercise   Blood Pressure (Admit)  130/70  142/80  134/70  -  122/68   Blood Pressure (Exercise)  124/70  150/80  146/78  -  150/68   Blood Pressure (Exit)  126/70  124/78  136/78  -  130/62   Heart Rate (Admit)  86 bpm  94 bpm  85 bpm  -  48 bpm   Heart Rate (Exercise)  108 bpm  117 bpm  115 bpm  -  110 bpm   Heart Rate (Exit)  93 bpm  85 bpm  85 bpm  -  82 bpm   Rating of Perceived Exertion (Exercise)  _0 -  14   Symptoms  none  none  none  -  none   Duration  Continue with 30 min of aerobic exercise without signs/symptoms of physical distress.  Continue with 30 min of aerobic exercise without signs/symptoms of physical distress.  Continue with 30 min of aerobic exercise without signs/symptoms of physical distress.  -  Continue with 30 min of aerobic exercise without signs/symptoms of physical distress.   Intensity  THRR unchanged  THRR unchanged  THRR unchanged  -  THRR unchanged     Progression   Progression  Continue to progress workloads to maintain intensity without signs/symptoms of physical distress.  Continue to progress workloads to maintain intensity without signs/symptoms of physical distress.  Continue to progress workloads to maintain intensity without signs/symptoms of physical distress.  -  Continue to progress workloads to maintain intensity without signs/symptoms of physical distress.   Average METs  1.53  1.47  1.73  -  2.3     Resistance  Training   Training Prescription  Yes  Yes  Yes  -  Yes   Weight  3 lbs  3 lbs  3 lbs  -  3 lb   Reps  10-15  10-15  10-15  -  10-15     Interval Training   Interval Training  No  No  No  -  No     Treadmill   MPH  1  0.8  -  -  1.6   Grade  0  0  -  -  1   Minutes  15  15  -  -  15   METs  1.77  1.7  -  -  2.45     NuStep   Level  _1 -  3  SPM  -  -  -  -  80   Minutes  '15  15  15  '$ -  15   METs  1.3  1.7  1.2  -  2     Arm Ergometer   Level  -  1  1  -  -   Minutes  -  15  15  -  -   METs  -  -  2  -  -     Biostep-RELP   Level  -  1  1  -  1   Minutes  -  15  15  -  15   METs  -  1  2  -  -     Home Exercise Plan   Plans to continue exercise at  -  -  -  Home (comment) walking, videos  -   Frequency  -  -  -  Add 2 additional days to program exercise sessions.  -   Initial Home Exercises Provided  -  -  -  02/27/19  -      Exercise Comments:   Exercise Goals and Review: Exercise Goals    Row Name 01/05/19 1441             Exercise Goals   Increase Physical Activity  Yes       Intervention  Provide advice, education, support and counseling about physical activity/exercise needs.;Develop an individualized exercise prescription for aerobic and resistive training based on initial evaluation findings, risk stratification, comorbidities and participant's personal goals.       Expected Outcomes  Short Term: Attend rehab on a regular basis to increase amount of physical activity.;Long Term: Add in home exercise to make exercise part of routine and to increase amount of physical activity.;Long Term: Exercising regularly at least 3-5 days a week.       Increase Strength and Stamina  Yes       Intervention  Provide advice, education, support and counseling about physical activity/exercise needs.;Develop an individualized exercise prescription for aerobic and resistive training based on initial evaluation findings, risk stratification, comorbidities and participant's  personal goals.       Expected Outcomes  Short Term: Increase workloads from initial exercise prescription for resistance, speed, and METs.;Short Term: Perform resistance training exercises routinely during rehab and add in resistance training at home;Long Term: Improve cardiorespiratory fitness, muscular endurance and strength as measured by increased METs and functional capacity (6MWT)       Able to understand and use rate of perceived exertion (RPE) scale  Yes       Intervention  Provide education and explanation on how to use RPE scale       Expected Outcomes  Short Term: Able to use RPE daily in rehab to express subjective intensity level;Long Term:  Able to use RPE to guide intensity level when exercising independently       Intervention  Provide education and explanation on how to use Dyspnea scale       Expected Outcomes  Short Term: Able to use Dyspnea scale daily in rehab to express subjective sense of shortness of breath during exertion;Long Term: Able to use Dyspnea scale to guide intensity level when exercising independently       Intervention  Provide education, explanation, and written materials on patient's individual exercise prescription          Exercise Goals Re-Evaluation : Exercise Goals Re-Evaluation    Row Name  01/09/19 1440 01/16/19 1149 02/02/19 1501 02/13/19 1522 02/21/19 1431     Exercise Goal Re-Evaluation   Exercise Goals Review  Able to understand and use rate of perceived exertion (RPE) scale;Increase Physical Activity;Knowledge and understanding of Target Heart Rate Range (THRR);Understanding of Exercise Prescription;Increase Strength and Stamina;Able to check pulse independently  Increase Physical Activity;Increase Strength and Stamina;Understanding of Exercise Prescription  Increase Physical Activity;Increase Strength and Stamina;Understanding of Exercise Prescription  Increase Physical Activity;Increase Strength and Stamina;Understanding of Exercise Prescription   Increase Physical Activity;Increase Strength and Stamina;Understanding of Exercise Prescription   Comments  Reviewed RPE scale, THR and program prescription with pt today.  Pt voiced understanding and was given a copy of goals to take home.  Teresa Moyer is off to a good start in rehab.  She has completed two full days of exercise.  We will continue to monitor her progress.  Teresa Moyer has been doing well in rehab.  She was sent to ED and admitted on Monday after exercise as she was very SOB and sats kept dropping.  She will need clearance to return to rehab.  Today was [name]'s first day back to heart track after being hospitalized for heart failure. She tolerated exercise well today and her blood pressure, heart rate, and oxygen levels were all within acceptable ranges. [name]'s SOB was much better today.  She started out easy and plans to come consistantly and build back strength.  Teresa Moyer is doing well in rehab.  She was able to return and pick back up where she was previously.  She is up to 2 METs on the arm crank and BioStep.  We will move her up at the next visit and continue to monitor her progress.   Expected Outcomes  Short: Use RPE daily to regulate intensity. Long: Follow program prescription in THR.  Short: Continue to attend regularly Long: Continue to follow program prescription.  Short: Cleared to return to rehab.  Long: Continue to increase stamina.  Short: Start attending heart track regularly and weigh every day to help manage heart failure. Long: as tolerated, incrementally increase workloads to work back to previous levels.  Short: Increase BioStep.  Long: Continue to increase stamina.   [place] Name 02/27/19 1506 03/01/19 1453 03/09/19 1408         Exercise Goal Re-Evaluation   Exercise Goals Review  Increase Physical Activity;Increase Strength and Stamina;Understanding of Exercise Prescription;Able to understand and use rate of perceived exertion (RPE) scale;Knowledge and understanding of Target Heart  Rate Range (THRR);Able to check pulse independently  Increase Physical Activity;Increase Strength and Stamina;Understanding of Exercise Prescription  Increase Physical Activity;Increase Strength and Stamina;Able to understand and use rate of perceived exertion (RPE) scale;Knowledge and understanding of Target Heart Rate Range (THRR);Able to check pulse independently;Understanding of Exercise Prescription     Comments  Reviewed home exercise with pt today.  Pt plans to walk and use videos at home for exercise.  Reviewed THR, pulse, RPE, sign and symptoms, NTG use, and when to call 911 or MD.  Also discussed weather considerations and indoor options.  Pt voiced understanding.  Teresa Moyer is doing well in rehab.  She has tried to get to the videos, but they could only find one.  She is going to try again to see if they can find all of them.  She is feeling stronger and to the point that she asked to use the treadmill today!!  Teresa Moyer has been walking on TM more consistently.  She has also increased level on NS.  Staff will monitor progress.     Expected Outcomes  Short: Start to add in exercise at home.  Long: Continue to increase stamina.  Short: Continue to try to access the YouTube videos.  Long: Continue to increase stamina.  Short - continue to walk consistently Long - build stamina        Discharge Exercise Prescription (Final Exercise Prescription Changes): Exercise Prescription Changes - 03/09/19 1400      Response to Exercise   Blood Pressure (Admit)  122/68    Blood Pressure (Exercise)  150/68    Blood Pressure (Exit)  130/62    Heart Rate (Admit)  48 bpm    Heart Rate (Exercise)  110 bpm    Heart Rate (Exit)  82 bpm    Rating of Perceived Exertion (Exercise)  14    Symptoms  none    Duration  Continue with 30 min of aerobic exercise without signs/symptoms of physical distress.    Intensity  THRR unchanged      Progression   Progression  Continue to progress workloads to maintain intensity  without signs/symptoms of physical distress.    Average METs  2.3      Resistance Training   Training Prescription  Yes    Weight  3 lb    Reps  10-15      Interval Training   Interval Training  No      Treadmill   MPH  1.6    Grade  1    Minutes  15    METs  2.45      NuStep   Level  3    SPM  80    Minutes  15    METs  2      Biostep-RELP   Level  1    Minutes  15       Nutrition:  Target Goals: Understanding of nutrition guidelines, daily intake of sodium '1500mg'$ , cholesterol '200mg'$ , calories 30% from fat and 7% or less from saturated fats, daily to have 5 or more servings of fruits and vegetables.  Biometrics: Pre Biometrics - 01/05/19 1436      Pre Biometrics   Height  '5\' 2"'$  (1.575 m)    Weight  146 lb (66.2 kg)    BMI (Calculated)  26.7    Single Leg Stand  2.65 seconds        Nutrition Therapy Plan and Nutrition Goals: Nutrition Therapy & Goals - 01/05/19 1328      Nutrition Therapy   Diet  Low Na, HH diet    Protein (specify units)  55g    Fiber  25 grams    Whole Grain Foods  3 servings    Saturated Fats  12 max. grams    Fruits and Vegetables  5 servings/day    Sodium  1.5 grams      Personal Nutrition Goals   Nutrition Goal  ST: wants to think over everything LT: eat healthier overall    Comments  Pt B: black coffee, oatmeal, toast and smart balance. L: sandwich (white wheat) - tomato with mayo, salt/pepper. D: meat/chicken, vegetables, potatoes. (will switch between brown and white rice), whole wheat pasta, does not like whole wheat bread (suggested bread brands I like to try). Pt will have 1 diet pepsi and snack on chips and regular popcorn (talked about switching to poppers instead of chips (pt asked). Pt will have small ice cream or dark chocolate kisses. Pt drinks almond milk (dairy like  feta can bother her), pt will use 1% milk in cooking, and will sometimes have whole eggs as part of her meal. Pt also reports sice quarentine going to  [place] 3x per week for a meal. Discussed HH eating and some meal ideas (pt doesn't know about what to do for lunch).      Intervention Plan   Intervention  Prescribe, educate and counsel regarding individualized specific dietary modifications aiming towards targeted core components such as weight, hypertension, lipid management, diabetes, heart failure and other comorbidities.;Nutrition handout(s) given to patient.    Expected Outcomes  Short Term Goal: Understand basic principles of dietary content, such as calories, fat, sodium, cholesterol and nutrients.;Short Term Goal: A plan has been developed with personal nutrition goals set during dietitian appointment.;Long Term Goal: Adherence to prescribed nutrition plan.       Nutrition Assessments: Nutrition Assessments - 01/05/19 1506      MEDFICTS Scores   Pre Score  28       Nutrition Goals Re-Evaluation: Nutrition Goals Re-Evaluation    [place] Name 02/15/19 1451 03/01/19 1451           Goals   Current Weight  -  142 lb 11.2 oz (64.7 kg)      Nutrition Goal  ST: limit Na intake and read labels LT: eat healthier overall  ST: limit Na intake and read labels; continue to decrease sodium intake LT: eat healthier overall      Comment  Discussed HH and low Na eating again due to recent hospitalization. Pt reports asking for things without salt at restaurants and buys lower sodium [place] deli slices. Gave pt lower sodium handout. Discussed making own cereal with cheaper whole grain cereal seeds or nuts and dried fruit because was going to buy expensive one.  Pt reports her vision is her major barrior in preparing food, pt is trying to make more food without sodium, got low sodium [place], choosing low sodium broth and is making more from scratch - this week she plans on making vegetable soup. Encouraged pt that she is doing well, pt reports trying her best for now doesn't need anything from RD.      Expected Outcome  ST: limit Na intake and  read labels LT: eat healthier overall  ST: limit Na intake and read labels LT: eat healthier overall         Nutrition Goals Discharge (Final Nutrition Goals Re-Evaluation): Nutrition Goals Re-Evaluation - 03/01/19 1451      Goals   Current Weight  142 lb 11.2 oz (64.7 kg)    Nutrition Goal  ST: limit Na intake and read labels; continue to decrease sodium intake LT: eat healthier overall    Comment  Pt reports her vision is her major barrior in preparing food, pt is trying to make more food without sodium, got low sodium [place], choosing low sodium broth and is making more from scratch - this week she plans on making vegetable soup. Encouraged pt that she is doing well, pt reports trying her best for now doesn't need anything from RD.    Expected Outcome  ST: limit Na intake and read labels LT: eat healthier overall       Psychosocial: Target Goals: Acknowledge presence or absence of significant depression and/or stress, maximize coping skills, provide positive support system. Participant is able to verbalize types and ability to use techniques and skills needed for reducing stress and depression.   Initial Review & Psychosocial Screening: Initial Psych Review &  Screening - 01/02/19 1554      Initial Review   Current issues with  Current Sleep Concerns;Current Stress Concerns    Comments  Teresa Moyer is having issues sleeping since her MI, she is hoping it will get better in the next week. She is handling the MI well and has a good support system, but this has definitely been a change in their everyday life.      Family Dynamics   Good Support System?  Yes      Barriers   Psychosocial barriers to participate in program  There are no identifiable barriers or psychosocial needs.      Screening Interventions   Interventions  Encouraged to exercise    Expected Outcomes  Short Term goal: Utilizing psychosocial counselor, staff and physician to assist with identification of specific Stressors or  current issues interfering with healing process. Setting desired goal for each stressor or current issue identified.;Long Term Goal: Stressors or current issues are controlled or eliminated.;Short Term goal: Identification and review with participant of any Quality of Life or Depression concerns found by scoring the questionnaire.;Long Term goal: The participant improves quality of Life and PHQ9 Scores as seen by post scores and/or verbalization of changes       Quality of Life Scores:  Quality of Life - 01/05/19 1357      Quality of Life   Select  Quality of Life      Quality of Life Scores   Health/Function Pre  18.83 %    Socioeconomic Pre  28.38 %    Psych/Spiritual Pre  30 %    Family Pre  27.6 %    GLOBAL Pre  24.5 %      Scores of 19 and below usually indicate a poorer quality of life in these areas.  A difference of  2-3 points is a clinically meaningful difference.  A difference of 2-3 points in the total score of the Quality of Life Index has been associated with significant improvement in overall quality of life, self-image, physical symptoms, and general health in studies assessing change in quality of life.  PHQ-9: Recent Review Flowsheet Data    Depression screen [facility] 2/9 01/05/2019 04/12/2018 02/23/2017 02/19/2016 08/14/2015   Decreased Interest 1 0 0 0 0   Down, Depressed, Hopeless - 0 0 0 0   PHQ - 2 Score 1 0 0 0 0   Altered sleeping 0 - - - -   Tired, decreased energy 1 - - - -   Change in appetite 1 - - - -   Feeling bad or failure about yourself  0 - - - -   Trouble concentrating 1 - - - -   Moving slowly or fidgety/restless 0 - - - -   Suicidal thoughts 0 - - - -   PHQ-9 Score 4 - - - -   Difficult doing work/chores Not difficult at all - - - -     Interpretation of Total Score  Total Score Depression Severity:  1-4 = Minimal depression, 5-9 = Mild depression, 10-14 = Moderate depression, 15-19 = Moderately severe depression, 20-27 = Severe depression    Psychosocial Evaluation and Intervention:   Psychosocial Re-Evaluation: Psychosocial Re-Evaluation    Row Name 02/13/19 1514 03/01/19 1457           Psychosocial Re-Evaluation   Current issues with  Current Stress Concerns  Current Stress Concerns      Comments  Patient stated that she was overwhelmed  with all the information she was given while in the hospital. She talked to her doctor about this and he reasurred her she was doing everything she was supposed to be doing and she stated she felt much better emotionally after that. She is sleeping well, but still having to use 2 pillows to help with breathing. Patient does not report any major depression or anxiety symptoms.  Teresa Moyer is doing well in rehab.  Her vision continues to be her biggest stressor and source of stress.  She also gets impatient with herself from time to time. She has enjoyed coming to class and being amongst people again is nice.  She usually sleeps pretty good but does have nights where she wakes because of her leg pain but they can't get back to sleep.      Expected Outcomes  Short: continue to take all medications and attend heart track classes. Long: Continue to maintain and manage mental health and good sleep habits.  Short: Continue to attend class for outlet.  Long: Continue to try not to be so hard on herself.      Interventions  Encouraged to attend Cardiac Rehabilitation for the exercise  Encouraged to attend Cardiac Rehabilitation for the exercise      Continue Psychosocial Services   Follow up required by staff  Follow up required by staff         Psychosocial Discharge (Final Psychosocial Re-Evaluation): Psychosocial Re-Evaluation - 03/01/19 1457      Psychosocial Re-Evaluation   Current issues with  Current Stress Concerns    Comments  Teresa Moyer is doing well in rehab.  Her vision continues to be her biggest stressor and source of stress.  She also gets impatient with herself from time to time. She has  enjoyed coming to class and being amongst people again is nice.  She usually sleeps pretty good but does have nights where she wakes because of her leg pain but they can't get back to sleep.    Expected Outcomes  Short: Continue to attend class for outlet.  Long: Continue to try not to be so hard on herself.    Interventions  Encouraged to attend Cardiac Rehabilitation for the exercise    Continue Psychosocial Services   Follow up required by staff       Vocational Rehabilitation: Provide vocational rehab assistance to qualifying candidates.   Vocational Rehab Evaluation & Intervention:   Education: Education Goals: Education classes will be provided on a variety of topics geared toward better understanding of heart health and risk factor modification. Participant will state understanding/return demonstration of topics presented as noted by education test scores.  Learning Barriers/Preferences: Learning Barriers/Preferences - 01/02/19 1550      Learning Barriers/Preferences   Learning Barriers  Sight   macular degeneration   Learning Preferences  None       Education Topics:  AED/CPR: - Group verbal and written instruction with the use of models to demonstrate the basic use of the AED with the basic ABC's of resuscitation.   General Nutrition Guidelines/Fats and Fiber: -Group instruction provided by verbal, written material, models and posters to present the general guidelines for heart healthy nutrition. Gives an explanation and review of dietary fats and fiber.   Controlling Sodium/Reading Food Labels: -Group verbal and written material supporting the discussion of sodium use in heart healthy nutrition. Review and explanation with models, verbal and written materials for utilization of the food label.   Exercise Physiology & General Exercise Guidelines: -  Group verbal and written instruction with models to review the exercise physiology of the cardiovascular system and  associated critical values. Provides general exercise guidelines with specific guidelines to those with heart or lung disease.    Aerobic Exercise & Resistance Training: - Gives group verbal and written instruction on the various components of exercise. Focuses on aerobic and resistive training programs and the benefits of this training and how to safely progress through these programs..   Flexibility, Balance, Mind/Body Relaxation: Provides group verbal/written instruction on the benefits of flexibility and balance training, including mind/body exercise modes such as yoga, pilates and tai chi.  Demonstration and skill practice provided.   Stress and Anxiety: - Provides group verbal and written instruction about the health risks of elevated stress and causes of high stress.  Discuss the correlation between heart/lung disease and anxiety and treatment options. Review healthy ways to manage with stress and anxiety.   Depression: - Provides group verbal and written instruction on the correlation between heart/lung disease and depressed mood, treatment options, and the stigmas associated with seeking treatment.   Anatomy & Physiology of the Heart: - Group verbal and written instruction and models provide basic cardiac anatomy and physiology, with the coronary electrical and arterial systems. Review of Valvular disease and Heart Failure   Cardiac Procedures: - Group verbal and written instruction to review commonly prescribed medications for heart disease. Reviews the medication, class of the drug, and side effects. Includes the steps to properly store meds and maintain the prescription regimen. (beta blockers and nitrates)   Cardiac Medications I: - Group verbal and written instruction to review commonly prescribed medications for heart disease. Reviews the medication, class of the drug, and side effects. Includes the steps to properly store meds and maintain the prescription  regimen.   Cardiac Medications II: -Group verbal and written instruction to review commonly prescribed medications for heart disease. Reviews the medication, class of the drug, and side effects. (all other drug classes)    Go Sex-Intimacy & Heart Disease, Get SMART - Goal Setting: - Group verbal and written instruction through game format to discuss heart disease and the return to sexual intimacy. Provides group verbal and written material to discuss and apply goal setting through the application of the S.M.A.R.T. Method.   Other Matters of the Heart: - Provides group verbal, written materials and models to describe Stable Angina and Peripheral Artery. Includes description of the disease process and treatment options available to the cardiac patient.   Exercise & Equipment Safety: - Individual verbal instruction and demonstration of equipment use and safety with use of the equipment.   Cardiac Rehab from 01/05/2019 in [facility] Cardiac and Pulmonary Rehab  Date  01/05/19  Educator  [place]  Instruction Review Code  1- Verbalizes Understanding      Infection Prevention: - Provides verbal and written material to individual with discussion of infection control including proper hand washing and proper equipment cleaning during exercise session.   Cardiac Rehab from 01/05/2019 in [facility] Cardiac and Pulmonary Rehab  Date  01/05/19  Educator  [place]  Instruction Review Code  1- Verbalizes Understanding      Falls Prevention: - Provides verbal and written material to individual with discussion of falls prevention and safety.   Cardiac Rehab from 01/05/2019 in [facility] Cardiac and Pulmonary Rehab  Date  01/05/19  Educator  [place]  Instruction Review Code  1- Verbalizes Understanding      Diabetes: - Individual verbal and written instruction to review signs/symptoms  of diabetes, desired ranges of glucose level fasting, after meals and with exercise. Acknowledge that pre and post exercise glucose checks will  be done for 3 sessions at entry of program.   Know Your Numbers and Risk Factors: -Group verbal and written instruction about important numbers in your health.  Discussion of what are risk factors and how they play a role in the disease process.  Review of Cholesterol, Blood Pressure, Diabetes, and BMI and the role they play in your overall health.   Sleep Hygiene: -Provides group verbal and written instruction about how sleep can affect your health.  Define sleep hygiene, discuss sleep cycles and impact of sleep habits. Review good sleep hygiene tips.    Other: -Provides group and verbal instruction on various topics (see comments)   Knowledge Questionnaire Score: Knowledge Questionnaire Score - 01/05/19 1357      Knowledge Questionnaire Score   Pre Score  25/26 (Exercise question missed)  reviewed correst response with Wells Guiles today. She verbalized understanding of the response.       Core Components/Risk Factors/Patient Goals at Admission: Personal Goals and Risk Factors at Admission - 01/02/19 1549      Core Components/Risk Factors/Patient Goals on Admission   Hypertension  Yes    Intervention  Provide education on lifestyle modifcations including regular physical activity/exercise, weight management, moderate sodium restriction and increased consumption of fresh fruit, vegetables, and low fat dairy, alcohol moderation, and smoking cessation.;Monitor prescription use compliance.    Expected Outcomes  Short Term: Continued assessment and intervention until BP is < 140/10m HG in hypertensive participants. < 130/816mHG in hypertensive participants with diabetes, heart failure or chronic kidney disease.;Long Term: Maintenance of blood pressure at goal levels.    Lipids  Yes    Intervention  Provide education and support for participant on nutrition & aerobic/resistive exercise along with prescribed medications to achieve LDL '70mg'$ , HDL >'40mg'$ .    Expected Outcomes  Short Term:  Participant states understanding of desired cholesterol values and is compliant with medications prescribed. Participant is following exercise prescription and nutrition guidelines.;Long Term: Cholesterol controlled with medications as prescribed, with individualized exercise RX and with personalized nutrition plan. Value goals: LDL < '70mg'$ , HDL > 40 mg.       Core Components/Risk Factors/Patient Goals Review:  Goals and Risk Factor Review    Row Name 02/13/19 1517 03/01/19 1501           Core Components/Risk Factors/Patient Goals Review   Personal Goals Review  Hypertension;Lipids;Heart Failure  Hypertension;Lipids;Heart Failure;Weight Management/Obesity      Review  Patient attended heart track for the first time after being hospitalized for heart failure. She is currently stable and taking all medications to manage fluid and blood pressure.  BeJacqlyn Larsenontinues to work on weight loss and is happy that it is going down.  Her blood pressures are doing well.  She denies any current heart failure symptoms.  She also watches her sodium intake.      Expected Outcomes  Short: continue to take new meds prescribed to manage fluid levels and blood pressure. Weigh every day. Start attending heart track regularly now that she had been cleared to return. Long: Manage heart failure and overall cardiac health with medication management and heart healthy diet and exercise.  Short: Continue to monitor sodium  Long: Continue to manage heart failure.         Core Components/Risk Factors/Patient Goals at Discharge (Final Review):  Goals and Risk Factor Review - 03/01/19 1501  Core Components/Risk Factors/Patient Goals Review   Personal Goals Review  Hypertension;Lipids;Heart Failure;Weight Management/Obesity    Review  Teresa Moyer continues to work on weight loss and is happy that it is going down.  Her blood pressures are doing well.  She denies any current heart failure symptoms.  She also watches her sodium  intake.    Expected Outcomes  Short: Continue to monitor sodium  Long: Continue to manage heart failure.       ITP Comments: ITP Comments    Row Name 01/02/19 1556 01/09/19 1440 01/25/19 0626 02/22/19 0635 03/22/19 1241   ITP Comments  Virtual orientation completed. Diagnosis can be found in [facility] 7/14. EP/RD orientation scheduled for 7/23 at pm  First full day of exercise!  Patient was oriented to gym and equipment including functions, settings, policies, and procedures.  Patient's individual exercise prescription and treatment plan were reviewed.  All starting workloads were established based on the results of the 6 minute walk test done at initial orientation visit.  The plan for exercise progression was also introduced and progression will be customized based on patient's performance and goals.  30 Day Review Completed today. Continue with ITP unless changed by Medical Director review.  New to program  30 Day review. Continue with ITP unless directed changes per Medical Director review.  30 day review completed. ITP sent to Dr. Emily Filbert, Medical Director of Cardiac and Pulmonary Rehab. Continue with ITP unless changes are made by physician.  Department closed starting 10/2 until further notice by infection prevention and Health at Work teams for [place].      Comments: 30 day review

## 2019-03-23 DIAGNOSIS — H353231 Exudative age-related macular degeneration, bilateral, with active choroidal neovascularization: Secondary | ICD-10-CM | POA: Diagnosis not present

## 2019-03-23 DIAGNOSIS — H353221 Exudative age-related macular degeneration, left eye, with active choroidal neovascularization: Secondary | ICD-10-CM | POA: Diagnosis not present
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
## 2019-03-24 ENCOUNTER — Other Ambulatory Visit: Payer: Self-pay

## 2019-03-24 ENCOUNTER — Ambulatory Visit: Payer: Medicare Other | Attending: Family | Admitting: Family

## 2019-03-24 ENCOUNTER — Encounter: Payer: Self-pay | Admitting: Family

## 2019-03-24 VITALS — BP 150/71 | HR 81 | Resp 18 | Ht 62.0 in | Wt 139.4 lb

## 2019-03-24 DIAGNOSIS — I252 Old myocardial infarction: Secondary | ICD-10-CM | POA: Diagnosis not present

## 2019-03-24 DIAGNOSIS — I110 Hypertensive heart disease with heart failure: Secondary | ICD-10-CM | POA: Diagnosis not present

## 2019-03-24 DIAGNOSIS — K219 Gastro-esophageal reflux disease without esophagitis: Secondary | ICD-10-CM | POA: Insufficient documentation

## 2019-03-24 DIAGNOSIS — M1990 Unspecified osteoarthritis, unspecified site: Secondary | ICD-10-CM | POA: Diagnosis not present

## 2019-03-24 DIAGNOSIS — R0602 Shortness of breath: Secondary | ICD-10-CM | POA: Diagnosis not present

## 2019-03-24 DIAGNOSIS — Z955 Presence of coronary angioplasty implant and graft: Secondary | ICD-10-CM | POA: Diagnosis not present

## 2019-03-24 DIAGNOSIS — I5032 Chronic diastolic (congestive) heart failure: Secondary | ICD-10-CM

## 2019-03-24 DIAGNOSIS — R05 Cough: Secondary | ICD-10-CM | POA: Insufficient documentation

## 2019-03-24 DIAGNOSIS — Z79899 Other long term (current) drug therapy: Secondary | ICD-10-CM | POA: Diagnosis not present

## 2019-03-24 DIAGNOSIS — I10 Essential (primary) hypertension: Secondary | ICD-10-CM

## 2019-03-24 DIAGNOSIS — Z7982 Long term (current) use of aspirin: Secondary | ICD-10-CM | POA: Diagnosis not present

## 2019-03-24 DIAGNOSIS — J3489 Other specified disorders of nose and nasal sinuses: Secondary | ICD-10-CM | POA: Diagnosis not present

## 2019-03-24 NOTE — Patient Instructions (Signed)
Continue weighing daily and call for an overnight weight gain of > 2 pounds or a weekly weight gain of >5 pounds. 

## 2019-03-27 ENCOUNTER — Other Ambulatory Visit: Payer: Self-pay

## 2019-03-27 ENCOUNTER — Encounter: Payer: Medicare Other | Admitting: *Deleted

## 2019-03-27 DIAGNOSIS — I213 ST elevation (STEMI) myocardial infarction of unspecified site: Secondary | ICD-10-CM

## 2019-03-27 NOTE — Progress Notes (Signed)
Daily Session Note  Patient Details  Name: Teresa Moyer MRN: [id number] Date of Birth: 1936-02-23 Referring Provider:     Cardiac Rehab from 01/05/2019 in [facility] Cardiac and Pulmonary Rehab  Referring Provider  Nehemiah Massed      Encounter Date: 03/27/2019  Check In: Session Check In - 03/27/19 1429      Check-In   Supervising physician immediately available to respond to emergencies  See telemetry face sheet for immediately available ER MD    Location  [facility]-Cardiac & Pulmonary Rehab    Staff Present  Renita Papa, RN BSN;Carroll Enterkin, RN, BSN-BC, CCRP;Jessica [place], MA, RCEP, CCRP, CCET    Virtual Visit  No    Medication changes reported      No    Fall or balance concerns reported     No    Warm-up and Cool-down  Performed on first and last piece of equipment    Resistance Training Performed  Yes    VAD Patient?  No    PAD/SET Patient?  No      Pain Assessment   Currently in Pain?  No/denies          Social History   Tobacco Use  Smoking Status Never Smoker  Smokeless Tobacco Never Used    Goals Met:  Independence with exercise equipment Exercise tolerated well Personal goals reviewed No report of cardiac concerns or symptoms Strength training completed today  Goals Unmet:  Not Applicable  Comments: Pt able to follow exercise prescription today without complaint.  Will continue to monitor for progression.   Dr. Emily Filbert is Medical Director for [name].

## 2019-03-29 ENCOUNTER — Other Ambulatory Visit: Payer: Self-pay

## 2019-03-29 ENCOUNTER — Encounter: Payer: Medicare Other | Admitting: *Deleted

## 2019-03-29 DIAGNOSIS — I213 ST elevation (STEMI) myocardial infarction of unspecified site: Secondary | ICD-10-CM

## 2019-03-29 NOTE — Progress Notes (Signed)
Daily Session Note  Patient Details  Name: Teresa Moyer MRN: [id number] Date of Birth: April 04, 1936 Referring Provider:     Cardiac Rehab from 01/05/2019 in [facility] Cardiac and Pulmonary Rehab  Referring Provider  Nehemiah Massed      Encounter Date: 03/29/2019  Check In: Session Check In - 03/29/19 1430      Check-In   Supervising physician immediately available to respond to emergencies  See telemetry face sheet for immediately available ER MD    Location  [facility]-Cardiac & Pulmonary Rehab    Staff Present  Renita Papa, RN BSN;Jessica Luan Pulling, MA, RCEP, CCRP, CCET;Melissa Buchanan RDN, LDN    Virtual Visit  No    Medication changes reported      No    Fall or balance concerns reported     No    Warm-up and Cool-down  Performed on first and last piece of equipment    Resistance Training Performed  Yes    VAD Patient?  No    PAD/SET Patient?  No      Pain Assessment   Currently in Pain?  No/denies          Social History   Tobacco Use  Smoking Status Never Smoker  Smokeless Tobacco Never Used    Goals Met:  Independence with exercise equipment Exercise tolerated well No report of cardiac concerns or symptoms Strength training completed today  Goals Unmet:  Not Applicable  Comments: Pt able to follow exercise prescription today without complaint.  Will continue to monitor for progression.    Dr. Emily Filbert is Medical Director for [place] and [name].

## 2019-03-30 DIAGNOSIS — H353211 Exudative age-related macular degeneration, right eye, with active choroidal neovascularization: Secondary | ICD-10-CM | POA: Diagnosis not present

## 2019-04-03 ENCOUNTER — Other Ambulatory Visit: Payer: Self-pay

## 2019-04-03 ENCOUNTER — Encounter: Payer: Medicare Other | Admitting: *Deleted

## 2019-04-03 DIAGNOSIS — I213 ST elevation (STEMI) myocardial infarction of unspecified site: Secondary | ICD-10-CM

## 2019-04-03 NOTE — Progress Notes (Signed)
Daily Session Note  Patient Details  Name: Teresa Moyer MRN: [id number] Date of Birth: [birth year] Referring Provider:     Cardiac Rehab from 01/05/2019 in [facility] Cardiac and Pulmonary Rehab  Referring Provider  Nehemiah Massed      Encounter Date: 04/03/2019  Check In: Session Check In - 04/03/19 1428      Check-In   Supervising physician immediately available to respond to emergencies  See telemetry face sheet for immediately available ER MD    Location  [facility]-Cardiac & Pulmonary Rehab    Staff Present  Renita Papa, RN Moises Blood, BS, ACSM CEP, Exercise Physiologist    Virtual Visit  No    Medication changes reported      No    Warm-up and Cool-down  Performed on first and last piece of equipment    Resistance Training Performed  Yes    VAD Patient?  No    PAD/SET Patient?  No      Pain Assessment   Currently in Pain?  No/denies          Social History   Tobacco Use  Smoking Status Never Smoker  Smokeless Tobacco Never Used    Goals Met:  Independence with exercise equipment Exercise tolerated well No report of cardiac concerns or symptoms Strength training completed today  Goals Unmet:  Not Applicable  Comments: Pt able to follow exercise prescription today without complaint.  Will continue to monitor for progression.    Dr. Emily Filbert is Medical Director for [place] and [name].

## 2019-04-05 ENCOUNTER — Other Ambulatory Visit: Payer: Self-pay

## 2019-04-05 ENCOUNTER — Encounter: Payer: Medicare Other | Admitting: *Deleted

## 2019-04-05 DIAGNOSIS — I213 ST elevation (STEMI) myocardial infarction of unspecified site: Secondary | ICD-10-CM

## 2019-04-05 NOTE — Progress Notes (Signed)
Daily Session Note  Patient Details  Name: Teresa Moyer MRN: [id number] Date of Birth: 02-20-1936 Referring Provider:     Cardiac Rehab from 01/05/2019 in [facility] Cardiac and Pulmonary Rehab  Referring Provider  Nehemiah Massed      Encounter Date: 04/05/2019  Check In: Session Check In - 04/05/19 1426      Check-In   Supervising physician immediately available to respond to emergencies  See telemetry face sheet for immediately available ER MD    Location  [facility]-Cardiac & Pulmonary Rehab    Staff Present  Renita Papa, RN BSN;Melissa Caiola RDN, Rowe Pavy, BA, ACSM CEP, Exercise Physiologist;Joseph Tessie Fass RCP,RRT,BSRT    Virtual Visit  No    Medication changes reported      No    Fall or balance concerns reported     No    Warm-up and Cool-down  Performed on first and last piece of equipment    Resistance Training Performed  Yes    VAD Patient?  No      Pain Assessment   Currently in Pain?  No/denies          Social History   Tobacco Use  Smoking Status Never Smoker  Smokeless Tobacco Never Used    Goals Met:  Independence with exercise equipment Exercise tolerated well No report of cardiac concerns or symptoms Strength training completed today  Goals Unmet:  Not Applicable  Comments: Pt able to follow exercise prescription today without complaint.  Will continue to monitor for progression.    Dr. Emily Filbert is Medical Director for [place] and [name].

## 2019-04-06 ENCOUNTER — Encounter: Payer: Medicare Other | Admitting: *Deleted

## 2019-04-06 DIAGNOSIS — I213 ST elevation (STEMI) myocardial infarction of unspecified site: Secondary | ICD-10-CM | POA: Diagnosis not present

## 2019-04-06 NOTE — Progress Notes (Signed)
Daily Session Note  Patient Details  Name: Teresa Moyer MRN: [id number] Date of Birth: 03-Mar-1936 Referring Provider:     Cardiac Rehab from 01/05/2019 in [facility] Cardiac and Pulmonary Rehab  Referring Provider  Nehemiah Massed      Encounter Date: 04/06/2019  Check In: Session Check In - 04/06/19 1445      Check-In   Supervising physician immediately available to respond to emergencies  See telemetry face sheet for immediately available ER MD    Location  [facility]-Cardiac & Pulmonary Rehab    Staff Present  Alberteen Sam, MA, RCEP, CCRP, CCET;Joseph Hood RCP,RRT,BSRT;Carroll Enterkin, Therapist, sports, [employer], CCRP    Virtual Visit  No    Medication changes reported      No    Warm-up and Cool-down  Performed on first and last piece of Teacher, music Performed  Yes    VAD Patient?  No    PAD/SET Patient?  No      Pain Assessment   Currently in Pain?  No/denies          Social History   Tobacco Use  Smoking Status Never Smoker  Smokeless Tobacco Never Used    Goals Met:  Independence with exercise equipment Exercise tolerated well No report of cardiac concerns or symptoms Strength training completed today  Goals Unmet:  Not Applicable  Comments: Pt able to follow exercise prescription today without complaint.  Will continue to monitor for progression.    Dr. Emily Filbert is Medical Director for [place] and [name].

## 2019-04-10 ENCOUNTER — Encounter: Payer: Medicare Other | Admitting: *Deleted

## 2019-04-10 ENCOUNTER — Other Ambulatory Visit: Payer: Self-pay

## 2019-04-10 DIAGNOSIS — I213 ST elevation (STEMI) myocardial infarction of unspecified site: Secondary | ICD-10-CM | POA: Diagnosis not present

## 2019-04-10 NOTE — Progress Notes (Signed)
Daily Session Note  Patient Details  Name: Teresa Moyer MRN: [id number] Date of Birth: 1936-04-26 Referring Provider:     Cardiac Rehab from 01/05/2019 in [facility] Cardiac and Pulmonary Rehab  Referring Provider  Nehemiah Massed      Encounter Date: 04/10/2019  Check In: Session Check In - 04/10/19 1431      Check-In   Supervising physician immediately available to respond to emergencies  See telemetry face sheet for immediately available ER MD    Location  [facility]-Cardiac & Pulmonary Rehab    Staff Present  Renita Papa, RN BSN;Joseph [address] [place], [place], ACSM CEP, Exercise Physiologist    Virtual Visit  No    Medication changes reported      No    Fall or balance concerns reported     No    Warm-up and Cool-down  Performed on first and last piece of equipment    Resistance Training Performed  Yes    VAD Patient?  No    PAD/SET Patient?  No      Pain Assessment   Currently in Pain?  No/denies          Social History   Tobacco Use  Smoking Status Never Smoker  Smokeless Tobacco Never Used    Goals Met:  Independence with exercise equipment Exercise tolerated well No report of cardiac concerns or symptoms Strength training completed today  Goals Unmet:  Not Applicable  Comments: Pt able to follow exercise prescription today without complaint.  Will continue to monitor for progression.    Dr. Emily Filbert is Medical Director for [place] and [name].

## 2019-04-12 ENCOUNTER — Other Ambulatory Visit: Payer: Self-pay

## 2019-04-12 ENCOUNTER — Encounter: Payer: Medicare Other | Admitting: *Deleted

## 2019-04-12 DIAGNOSIS — I213 ST elevation (STEMI) myocardial infarction of unspecified site: Secondary | ICD-10-CM | POA: Diagnosis not present

## 2019-04-12 NOTE — Progress Notes (Signed)
Daily Session Note  Patient Details  Name: Teresa Moyer MRN: [id number] Date of Birth: [birth year] Referring Provider:     Cardiac Rehab from 01/05/2019 in [facility] Cardiac and Pulmonary Rehab  Referring Provider  Nehemiah Massed      Encounter Date: 04/12/2019  Check In: Session Check In - 04/12/19 1435      Check-In   Supervising physician immediately available to respond to emergencies  See telemetry face sheet for immediately available ER MD    Location  [facility]-Cardiac & Pulmonary Rehab    Staff Present  Renita Papa, RN Vickki Hearing, BA, ACSM CEP, Exercise Physiologist;Joseph Tessie Fass RCP,RRT,BSRT    Virtual Visit  No    Medication changes reported      No    Fall or balance concerns reported     No    Warm-up and Cool-down  Performed on first and last piece of equipment    Resistance Training Performed  Yes    VAD Patient?  No    PAD/SET Patient?  No      Pain Assessment   Currently in Pain?  No/denies          Social History   Tobacco Use  Smoking Status Never Smoker  Smokeless Tobacco Never Used    Goals Met:  Independence with exercise equipment Exercise tolerated well No report of cardiac concerns or symptoms Strength training completed today  Goals Unmet:  Not Applicable  Comments: Pt able to follow exercise prescription today without complaint.  Will continue to monitor for progression.    Dr. Emily Filbert is Medical Director for [place] and [name].

## 2019-04-13 DIAGNOSIS — E782 Mixed hyperlipidemia: Secondary | ICD-10-CM | POA: Diagnosis not present

## 2019-04-13 DIAGNOSIS — I2510 Atherosclerotic heart disease of native coronary artery without angina pectoris: Secondary | ICD-10-CM | POA: Diagnosis not present

## 2019-04-13 DIAGNOSIS — I350 Nonrheumatic aortic (valve) stenosis: Secondary | ICD-10-CM | POA: Diagnosis not present

## 2019-04-13 DIAGNOSIS — I10 Essential (primary) hypertension: Secondary | ICD-10-CM | POA: Diagnosis not present

## 2019-04-14 ENCOUNTER — Ambulatory Visit: Payer: Medicare Other

## 2019-04-17 ENCOUNTER — Other Ambulatory Visit: Payer: Self-pay

## 2019-04-17 ENCOUNTER — Encounter: Payer: Medicare Other | Attending: Internal Medicine | Admitting: *Deleted

## 2019-04-17 DIAGNOSIS — I213 ST elevation (STEMI) myocardial infarction of unspecified site: Secondary | ICD-10-CM

## 2019-04-17 NOTE — Progress Notes (Signed)
Daily Session Note  Patient Details  Name: Teresa Moyer MRN: [id number] Date of Birth: 04-06-1936 Referring Provider:     Cardiac Rehab from 01/05/2019 in [facility] Cardiac and Pulmonary Rehab  Referring Provider  Nehemiah Massed      Encounter Date: 04/17/2019  Check In: Session Check In - 04/17/19 1428      Check-In   Supervising physician immediately available to respond to emergencies  See telemetry face sheet for immediately available ER MD    Location  [facility]-Cardiac & Pulmonary Rehab    Staff Present  Renita Papa, RN Moises Blood, BS, ACSM CEP, Exercise Physiologist;Joseph Tessie Fass RCP,RRT,BSRT    Virtual Visit  No    Medication changes reported      No    Fall or balance concerns reported     No    Warm-up and Cool-down  Performed on first and last piece of equipment    Resistance Training Performed  Yes    VAD Patient?  No    PAD/SET Patient?  No      Pain Assessment   Currently in Pain?  No/denies          Social History   Tobacco Use  Smoking Status Never Smoker  Smokeless Tobacco Never Used    Goals Met:  Independence with exercise equipment Exercise tolerated well No report of cardiac concerns or symptoms Strength training completed today  Goals Unmet:  Not Applicable  Comments: Pt able to follow exercise prescription today without complaint.  Will continue to monitor for progression.    Dr. Emily Filbert is Medical Director for [place] and [name].

## 2019-04-19 ENCOUNTER — Other Ambulatory Visit: Payer: Self-pay

## 2019-04-19 ENCOUNTER — Encounter: Payer: Self-pay | Admitting: *Deleted

## 2019-04-19 ENCOUNTER — Encounter: Payer: Medicare Other | Admitting: *Deleted

## 2019-04-19 DIAGNOSIS — I213 ST elevation (STEMI) myocardial infarction of unspecified site: Secondary | ICD-10-CM

## 2019-04-19 NOTE — Progress Notes (Signed)
Daily Session Note  Patient Details  Name: Teresa Moyer MRN: [id number] Date of Birth: [birth year] Referring Provider:     Cardiac Rehab from 01/05/2019 in [facility] Cardiac and Pulmonary Rehab  Referring Provider  Nehemiah Massed      Encounter Date: 04/19/2019  Check In: Session Check In - 04/19/19 1427      Check-In   Supervising physician immediately available to respond to emergencies  See telemetry face sheet for immediately available ER MD    Location  [facility]-Cardiac & Pulmonary Rehab    Staff Present  Renita Papa, RN Vickki Hearing, BA, ACSM CEP, Exercise Physiologist;Joseph Tessie Fass RCP,RRT,BSRT    Virtual Visit  No    Medication changes reported      No    Fall or balance concerns reported     No    Warm-up and Cool-down  Performed on first and last piece of equipment    Resistance Training Performed  Yes    VAD Patient?  No    PAD/SET Patient?  No      Pain Assessment   Currently in Pain?  No/denies          Social History   Tobacco Use  Smoking Status Never Smoker  Smokeless Tobacco Never Used    Goals Met:  Independence with exercise equipment Exercise tolerated well No report of cardiac concerns or symptoms Strength training completed today  Goals Unmet:  Not Applicable  Comments: Pt able to follow exercise prescription today without complaint.  Will continue to monitor for progression.    Dr. Emily Filbert is Medical Director for [place] and [name].

## 2019-04-19 NOTE — Progress Notes (Signed)
Cardiac Individual Treatment Plan  Patient Details  Name: Teresa Moyer MRN: [id number] Date of Birth: 08-23-1935 Referring Provider:     Cardiac Rehab from 01/05/2019 in [facility] ([place] Cardiac and Pulmonary Rehab  Referring Provider  Nehemiah Massed      Initial Encounter Date:    Cardiac Rehab from 01/05/2019 in [facility] Cardiac and Pulmonary Rehab  Date  01/05/19      Visit Diagnosis: ST elevation myocardial infarction (STEMI), unspecified artery ([place])  Patient's Home Medications on Admission:  Current Outpatient Medications:  .  acetaminophen (TYLENOL) 650 MG CR tablet, Take 650 mg by mouth every 8 (eight) hours as needed for pain., Disp: , Rfl:  .  aspirin 81 MG chewable tablet, Chew 1 tablet (81 mg total) by mouth daily., Disp: 30 tablet, Rfl: 0 .  atorvastatin (LIPITOR) 40 MG tablet, Take 1 tablet (40 mg total) by mouth daily at 6 PM., Disp: 30 tablet, Rfl: 0 .  azelastine (ASTELIN) 0.1 % nasal spray, Place 1 spray into both nostrils at bedtime. , Disp: , Rfl:  .  Carboxymethylcell-Hypromellose (GENTEAL) 0.25-0.3 % GEL, Apply 1 application to eye at bedtime., Disp: , Rfl:  .  carvedilol (COREG) 6.25 MG tablet, Take 1 tablet (6.25 mg total) by mouth 2 (two) times daily with a meal., Disp: 180 tablet, Rfl: 3 .  cetirizine (ZYRTEC ALLERGY) 10 MG tablet, Take 10 mg by mouth at bedtime. , Disp: , Rfl:  .  clopidogrel (PLAVIX) 75 MG tablet, Take 1 tablet by mouth daily., Disp: , Rfl:  .  fluticasone (FLONASE) 50 MCG/ACT nasal spray, Place 1 spray into both nostrils daily. , Disp: , Rfl:  .  furosemide (LASIX) 20 MG tablet, Take 1 tablet (20 mg total) by mouth daily. Take 40 mg daily for 3 days,till Friday,after that resume 20 mg daily, Disp: 30 tablet, Rfl: 1 .  irbesartan (AVAPRO) 300 MG tablet, Take 0.5 tablets (150 mg total) by mouth daily., Disp: 30 tablet, Rfl: 0 .  Multiple Vitamins-Minerals (PRESERVISION AREDS PO), Take 1 tablet by mouth 2 (two) times daily. , Disp: , Rfl:  .  Nutritional  Supplements (JOINT FORMULA PO), Take 1 capsule by mouth daily before breakfast. Arthrozene, Disp: , Rfl:  .  omeprazole (PRILOSEC) 20 MG capsule, Take 1 capsule (20 mg total) by mouth daily., Disp: 90 capsule, Rfl: 3 .  Polyethyl Glycol-Propyl Glycol (SYSTANE) 0.4-0.3 % SOLN, Place 1 drop into both eyes 3 (three) times daily as needed (for dry eyes)., Disp: , Rfl:  .  potassium chloride (KLOR-CON 10) 10 MEQ tablet, Take 1 tablet (10 mEq total) by mouth daily., Disp: 30 tablet, Rfl: 0 .  Probiotic Product (ALIGN) 4 MG CAPS, Take 4 mg by mouth daily after lunch. , Disp: , Rfl:   Past Medical History: Past Medical History:  Diagnosis Date  . Cataracts, bilateral   . Environmental allergies   . GERD (gastroesophageal reflux disease)   . Heart murmur 2019   had since a child. dr. Nehemiah Massed follows d/t getting louder  . Hemorrhoids   . History of chickenpox   . Hypertension   . Incontinence in female   . Osteoarthritis   . Skin cancer 12/2017   BCC. nose removed from nose last week.  shoulder squamos cell removed previously  . Type O blood, Rh negative 12/2017   patient requests that this be present on her chart    Tobacco Use: Social History   Tobacco Use  Smoking Status Never Smoker  Smokeless Tobacco Never  Used    Labs: Recent Chemical engineer    Labs for ITP Cardiac and Pulmonary Rehab Latest Ref Rng & Units 09/01/2016 07/01/2017 11/18/2018 12/28/2018 01/11/2019   Cholestrol 100 - 199 mg/dL 171 167 163 - 112   LDLCALC 0 - 99 mg/dL 66 61 61 - 28   HDL >39 mg/dL 74 66 77 - 61   Trlycerides 0 - 149 mg/dL 157(H) 199(H) 127 - 113   Hemoglobin A1c 4.8 - 5.6 % - - - 5.8(H) -       Exercise Target Goals: Exercise Program Goal: Individual exercise prescription set using results from initial 6 min walk test and THRR while considering  patient's activity barriers and safety.   Exercise Prescription Goal: Initial exercise prescription builds to 30-45 minutes a day of aerobic  activity, 2-3 days per week.  Home exercise guidelines will be given to patient during program as part of exercise prescription that the participant will acknowledge.  Activity Barriers & Risk Stratification: Activity Barriers & Cardiac Risk Stratification - 01/02/19 1553      Activity Barriers & Cardiac Risk Stratification   Activity Barriers  Arthritis    Cardiac Risk Stratification  Moderate       6 Minute Walk: 6 Minute Walk    Row Name 01/05/19 1432         6 Minute Walk   Phase  Initial     Distance  263 feet     Walk Time  2 minutes     MPH  1.49     METS  0.31     RPE  13     Perceived Dyspnea   3     VO2 Peak  1.1     Symptoms  Yes (comment)     Comments  Patient stopped due to shortness of breath which she reports is increased when wearing a mask. SpO2 90% increased to 96%. Patient did not wish to resume test until after time had expired.     Resting HR  79 bpm     Resting BP  132/80     Exercise Oxygen Saturation  during 6 min walk  90 %     Max Ex. HR  84 bpm     Max Ex. BP  146/80     2 Minute Post BP  122/68        Oxygen Initial Assessment:   Oxygen Re-Evaluation:   Oxygen Discharge (Final Oxygen Re-Evaluation):   Initial Exercise Prescription: Initial Exercise Prescription - 01/05/19 1400      Date of Initial Exercise RX and Referring Provider   Date  01/05/19    Referring Provider  Nehemiah Massed      Treadmill   MPH  1    Grade  0    Minutes  15    METs  0.5      Recumbant Bike   Level  1    RPM  50    Watts  25    Minutes  15    METs  1      NuStep   Level  1    SPM  50    Minutes  15    METs  1      Arm Ergometer   Level  1    Watts  10    RPM  10    Minutes  15    METs  1      Biostep-RELP   Level  1    SPM  30    Minutes  15    METs  1      Prescription Details   Frequency (times per week)  3-5    Duration  Progress to 30 minutes of continuous aerobic without signs/symptoms of physical distress      Intensity    THRR 40-80% of Max Heartrate  103-127    Ratings of Perceived Exertion  11-13    Perceived Dyspnea  0-4      Progression   Progression  Continue progressive overload as per policy without signs/symptoms or physical distress.      Resistance Training   Training Prescription  Yes    Weight  2    Reps  10-15       Perform Capillary Blood Glucose checks as needed.  Exercise Prescription Changes: Exercise Prescription Changes    Row Name 01/16/19 1100 02/02/19 1500 02/21/19 1400 02/27/19 1500 03/09/19 1400     Response to Exercise   Blood Pressure (Admit)  130/70  142/80  134/70  -  122/68   Blood Pressure (Exercise)  124/70  150/80  146/78  -  150/68   Blood Pressure (Exit)  126/70  124/78  136/78  -  130/62   Heart Rate (Admit)  86 bpm  94 bpm  85 bpm  -  48 bpm   Heart Rate (Exercise)  108 bpm  117 bpm  115 bpm  -  110 bpm   Heart Rate (Exit)  93 bpm  85 bpm  85 bpm  -  82 bpm   Rating of Perceived Exertion (Exercise)  _0 -  14   Symptoms  none  none  none  -  none   Duration  Continue with 30 min of aerobic exercise without signs/symptoms of physical distress.  Continue with 30 min of aerobic exercise without signs/symptoms of physical distress.  Continue with 30 min of aerobic exercise without signs/symptoms of physical distress.  -  Continue with 30 min of aerobic exercise without signs/symptoms of physical distress.   Intensity  THRR unchanged  THRR unchanged  THRR unchanged  -  THRR unchanged     Progression   Progression  Continue to progress workloads to maintain intensity without signs/symptoms of physical distress.  Continue to progress workloads to maintain intensity without signs/symptoms of physical distress.  Continue to progress workloads to maintain intensity without signs/symptoms of physical distress.  -  Continue to progress workloads to maintain intensity without signs/symptoms of physical distress.   Average METs  1.53  1.47  1.73  -  2.3     Resistance  Training   Training Prescription  Yes  Yes  Yes  -  Yes   Weight  3 lbs  3 lbs  3 lbs  -  3 lb   Reps  10-15  10-15  10-15  -  10-15     Interval Training   Interval Training  No  No  No  -  No     Treadmill   MPH  1  0.8  -  -  1.6   Grade  0  0  -  -  1   Minutes  15  15  -  -  15   METs  1.77  1.7  -  -  2.45     NuStep   Level  _1 -  3  SPM  -  -  -  -  80   Minutes  _0 -  15   METs  1.3  1.7  1.2  -  2     Arm Ergometer   Level  -  1  1  -  -   Minutes  -  15  15  -  -   METs  -  -  2  -  -     Biostep-RELP   Level  -  1  1  -  1   Minutes  -  15  15  -  15   METs  -  1  2  -  -     Home Exercise Plan   Plans to continue exercise at  -  -  -  Home (comment) walking, videos  -   Frequency  -  -  -  Add 2 additional days to program exercise sessions.  -   Initial Home Exercises Provided  -  -  -  02/27/19  -   [place] Name 03/28/19 1300 04/04/19 1100           Response to Exercise   Blood Pressure (Admit)  112/66  142/82      Blood Pressure (Exercise)  128/64  168/82      Blood Pressure (Exit)  138/70  150/80      Heart Rate (Admit)  88 bpm  80 bpm      Heart Rate (Exercise)  112 bpm  112 bpm      Heart Rate (Exit)  85 bpm  72 bpm      Rating of Perceived Exertion (Exercise)  12  14      Symptoms  none  none      Duration  Continue with 30 min of aerobic exercise without signs/symptoms of physical distress.  Continue with 30 min of aerobic exercise without signs/symptoms of physical distress.      Intensity  THRR unchanged  THRR unchanged        Progression   Progression  Continue to progress workloads to maintain intensity without signs/symptoms of physical distress.  Continue to progress workloads to maintain intensity without signs/symptoms of physical distress.      Average METs  2.25  2.25        Resistance Training   Training Prescription  Yes  Yes      Weight  3 lbs  3 lbs      Reps  10-15  10-15        Interval Training   Interval  Training  No  No        Treadmill   MPH  1.6  1.6      Grade  1  1      Minutes  15  15      METs  2.45  2.45        NuStep   Level  4  4      Minutes  15  15      METs  2  2        Home Exercise Plan   Plans to continue exercise at  Home (comment) walking, videos  Home (comment) walking, videos      Frequency  Add 2 additional days to program exercise sessions.  Add 2 additional days to program exercise sessions.      Initial Home Exercises Provided  02/27/19  02/27/19         Exercise Comments:   Exercise Goals and Review: Exercise Goals    Row Name 01/05/19 1441             Exercise Goals   Increase Physical Activity  Yes       Intervention  Provide advice, education, support and counseling about physical activity/exercise needs.;Develop an individualized exercise prescription for aerobic and resistive training based on initial evaluation findings, risk stratification, comorbidities and participant's personal goals.       Expected Outcomes  Short Term: Attend rehab on a regular basis to increase amount of physical activity.;Long Term: Add in home exercise to make exercise part of routine and to increase amount of physical activity.;Long Term: Exercising regularly at least 3-5 days a week.       Increase Strength and Stamina  Yes       Intervention  Provide advice, education, support and counseling about physical activity/exercise needs.;Develop an individualized exercise prescription for aerobic and resistive training based on initial evaluation findings, risk stratification, comorbidities and participant's personal goals.       Expected Outcomes  Short Term: Increase workloads from initial exercise prescription for resistance, speed, and METs.;Short Term: Perform resistance training exercises routinely during rehab and add in resistance training at home;Long Term: Improve cardiorespiratory fitness, muscular endurance and strength as measured by increased METs and functional  capacity (6MWT)       Able to understand and use rate of perceived exertion (RPE) scale  Yes       Intervention  Provide education and explanation on how to use RPE scale       Expected Outcomes  Short Term: Able to use RPE daily in rehab to express subjective intensity level;Long Term:  Able to use RPE to guide intensity level when exercising independently       Intervention  Provide education and explanation on how to use Dyspnea scale       Expected Outcomes  Short Term: Able to use Dyspnea scale daily in rehab to express subjective sense of shortness of breath during exertion;Long Term: Able to use Dyspnea scale to guide intensity level when exercising independently       Intervention  Provide education, explanation, and written materials on patient's individual exercise prescription          Exercise Goals Re-Evaluation : Exercise Goals Re-Evaluation    Row Name 01/09/19 1440 01/16/19 1149 02/02/19 1501 02/13/19 1522 02/21/19 1431     Exercise Goal Re-Evaluation   Exercise Goals Review  Able to understand and use rate of perceived exertion (RPE) scale;Increase Physical Activity;Knowledge and understanding of Target Heart Rate Range (THRR);Understanding of Exercise Prescription;Increase Strength and Stamina;Able to check pulse independently  Increase Physical Activity;Increase Strength and Stamina;Understanding of Exercise Prescription  Increase Physical Activity;Increase Strength and Stamina;Understanding of Exercise Prescription  Increase Physical Activity;Increase Strength and Stamina;Understanding of Exercise Prescription  Increase Physical Activity;Increase Strength and Stamina;Understanding of Exercise Prescription   Comments  Reviewed RPE scale, THR and program prescription with pt today.  Pt voiced understanding and was given a copy of goals to take home.  Teresa Moyer is off to a good start in rehab.  She has completed two full days of exercise.  We will continue to monitor her progress.  Teresa Moyer  has been doing well in rehab.  She was sent to ED and admitted on Monday after exercise as she was very SOB and sats kept dropping.  She will need clearance to  return to rehab.  Today was Teresa Moyer's first day back to heart track after being hospitalized for heart failure. She tolerated exercise well today and her blood pressure, heart rate, and oxygen levels were all within acceptable ranges. Teresa Moyer's SOB was much better today.  She started out easy and plans to come consistantly and build back strength.  Teresa Moyer is doing well in rehab.  She was able to return and pick back up where she was previously.  She is up to 2 METs on the arm crank and BioStep.  We will move her up at the next visit and continue to monitor her progress.   Expected Outcomes  Short: Use RPE daily to regulate intensity. Long: Follow program prescription in THR.  Short: Continue to attend regularly Long: Continue to follow program prescription.  Short: Cleared to return to rehab.  Long: Continue to increase stamina.  Short: Start attending heart track regularly and weigh every day to help manage heart failure. Long: as tolerated, incrementally increase workloads to work back to previous levels.  Short: Increase BioStep.  Long: Continue to increase stamina.   [place] Name 02/27/19 1506 03/01/19 1453 03/09/19 1408 03/27/19 1502 04/04/19 1105     Exercise Goal Re-Evaluation   Exercise Goals Review  Increase Physical Activity;Increase Strength and Stamina;Understanding of Exercise Prescription;Able to understand and use rate of perceived exertion (RPE) scale;Knowledge and understanding of Target Heart Rate Range (THRR);Able to check pulse independently  Increase Physical Activity;Increase Strength and Stamina;Understanding of Exercise Prescription  Increase Physical Activity;Increase Strength and Stamina;Able to understand and use rate of perceived exertion (RPE) scale;Knowledge and understanding of Target Heart Rate Range (THRR);Able to check pulse  independently;Understanding of Exercise Prescription  Increase Strength and Stamina;Increase Physical Activity;Understanding of Exercise Prescription  Increase Strength and Stamina;Increase Physical Activity;Understanding of Exercise Prescription   Comments  Reviewed home exercise with pt today.  Pt plans to walk and use videos at home for exercise.  Reviewed THR, pulse, RPE, sign and symptoms, NTG use, and when to call 911 or MD.  Also discussed weather considerations and indoor options.  Pt voiced understanding.  Teresa Moyer is doing well in rehab.  She has tried to get to the videos, but they could only find one.  She is going to try again to see if they can find all of them.  She is feeling stronger and to the point that she asked to use the treadmill today!!  Teresa Moyer has been walking on TM more consistently.  She has also increased level on NS.  Staff will monitor progress.  Teresa Moyer is doing well in rehab.  She is has been doing her videos and some walking at home.  The walking usually comes from her shopping as its more motivating and flatter.  She does feel a lot stronger overall.  Teresa Moyer continues to do well in rehab.  She is enjoying the treadmill and her incline.  She continues to hold steady.  We will continue to monitor her progress.   Expected Outcomes  Short: Start to add in exercise at home.  Long: Continue to increase stamina.  Short: Continue to try to access the YouTube videos.  Long: Continue to increase stamina.  Short - continue to walk consistently Long - build stamina  Short: Continue to walk more at home.  Long: Continue to build her stamina.  Short: Walk more at home.  Long: Continue to build stamina.      Discharge Exercise Prescription (Final Exercise Prescription Changes): Exercise Prescription Changes -  04/04/19 1100      Response to Exercise   Blood Pressure (Admit)  142/82    Blood Pressure (Exercise)  168/82    Blood Pressure (Exit)  150/80    Heart Rate (Admit)  80 bpm    Heart  Rate (Exercise)  112 bpm    Heart Rate (Exit)  72 bpm    Rating of Perceived Exertion (Exercise)  14    Symptoms  none    Duration  Continue with 30 min of aerobic exercise without signs/symptoms of physical distress.    Intensity  THRR unchanged      Progression   Progression  Continue to progress workloads to maintain intensity without signs/symptoms of physical distress.    Average METs  2.25      Resistance Training   Training Prescription  Yes    Weight  3 lbs    Reps  10-15      Interval Training   Interval Training  No      Treadmill   MPH  1.6    Grade  1    Minutes  15    METs  2.45      NuStep   Level  4    Minutes  15    METs  2      Home Exercise Plan   Plans to continue exercise at  Home (comment)   walking, videos   Frequency  Add 2 additional days to program exercise sessions.    Initial Home Exercises Provided  02/27/19       Nutrition:  Target Goals: Understanding of nutrition guidelines, daily intake of sodium <1533m, cholesterol <2068m calories 30% from fat and 7% or less from saturated fats, daily to have 5 or more servings of fruits and vegetables.  Biometrics: Pre Biometrics - 01/05/19 1436      Pre Biometrics   Height  _0  (1.575 m)    Weight  146 lb (66.2 kg)    BMI (Calculated)  26.7    Single Leg Stand  2.65 seconds        Nutrition Therapy Plan and Nutrition Goals: Nutrition Therapy & Goals - 01/05/19 1328      Nutrition Therapy   Diet  Low Na, HH diet    Protein (specify units)  55g    Fiber  25 grams    Whole Grain Foods  3 servings    Saturated Fats  12 max. grams    Fruits and Vegetables  5 servings/day    Sodium  1.5 grams      Personal Nutrition Goals   Nutrition Goal  ST: wants to think over everything LT: eat healthier overall    Comments  Pt B: black coffee, oatmeal, toast and smart balance. L: sandwich (white wheat) - tomato with mayo, salt/pepper. D: meat/chicken, vegetables, potatoes. (will switch between  brown and white rice), whole wheat pasta, does not like whole wheat bread (suggested bread brands I like to try). Pt will have 1 diet pepsi and snack on chips and regular popcorn (talked about switching to poppers instead of chips (pt asked). Pt will have small ice cream or dark chocolate kisses. Pt drinks almond milk (dairy like feta can bother her), pt will use 1% milk in cooking, and will sometimes have whole eggs as part of her meal. Pt also reports sice quarentine going to [employer] 3x per week for a meal. Discussed HH eating and some meal ideas (pt doesn't know about what to  do for lunch).      Intervention Plan   Intervention  Prescribe, educate and counsel regarding individualized specific dietary modifications aiming towards targeted core components such as weight, hypertension, lipid management, diabetes, heart failure and other comorbidities.;Nutrition handout(s) given to patient.    Expected Outcomes  Short Term Goal: Understand basic principles of dietary content, such as calories, fat, sodium, cholesterol and nutrients.;Short Term Goal: A plan has been developed with personal nutrition goals set during dietitian appointment.;Long Term Goal: Adherence to prescribed nutrition plan.       Nutrition Assessments: Nutrition Assessments - 01/05/19 1506      MEDFICTS Scores   Pre Score  28       Nutrition Goals Re-Evaluation: Nutrition Goals Re-Evaluation    Row Name 02/15/19 1451 03/01/19 1451 04/03/19 1442         Goals   Current Weight  -  142 lb 11.2 oz (64.7 kg)  -     Nutrition Goal  ST: limit Na intake and read labels LT: eat healthier overall  ST: limit Na intake and read labels; continue to decrease sodium intake LT: eat healthier overall  ST: increase fruit and vegetable intake LT: eat healthier overall     Comment  Discussed HH and low Na eating again due to recent hospitalization. Pt reports asking for things without salt at restaurants and buys lower sodium [place] deli  slices. Gave pt lower sodium handout. Discussed making own cereal with cheaper whole grain cereal seeds or nuts and dried fruit because was going to buy expensive one.  Pt reports her vision is her major barrior in preparing food, pt is trying to make more food without sodium, got low sodium [place], choosing low sodium broth and is making more from scratch - this week she plans on making vegetable soup. Encouraged pt that she is doing well, pt reports trying her best for now doesn't need anything from RD.  Pt reports that she would like to increase her fruit and vegetable intake. Pt sister helps her with her prepping her food as she has difficulty seeing, we discussed some vegteables with limited to no prep such as spinach and baby carrots. Also discussed some tools to help such as a mini chopper. Pt was responsive. Gave some examples of how to incorporate more vegetables and some examples of pasta dishes per pt request.     Expected Outcome  ST: limit Na intake and read labels LT: eat healthier overall  ST: limit Na intake and read labels LT: eat healthier overall  ST: increase fruit and vegetable intake LT: eat healthier overall        Nutrition Goals Discharge (Final Nutrition Goals Re-Evaluation): Nutrition Goals Re-Evaluation - 04/03/19 1442      Goals   Nutrition Goal  ST: increase fruit and vegetable intake LT: eat healthier overall    Comment  Pt reports that she would like to increase her fruit and vegetable intake. Pt sister helps her with her prepping her food as she has difficulty seeing, we discussed some vegteables with limited to no prep such as spinach and baby carrots. Also discussed some tools to help such as a mini chopper. Pt was responsive. Gave some examples of how to incorporate more vegetables and some examples of pasta dishes per pt request.    Expected Outcome  ST: increase fruit and vegetable intake LT: eat healthier overall       Psychosocial: Target Goals: Acknowledge  presence or absence of significant  depression and/or stress, maximize coping skills, provide positive support system. Participant is able to verbalize types and ability to use techniques and skills needed for reducing stress and depression.   Initial Review & Psychosocial Screening: Initial Psych Review & Screening - 01/02/19 1554      Initial Review   Current issues with  Current Sleep Concerns;Current Stress Concerns    Comments  Teresa Moyer is having issues sleeping since her MI, she is hoping it will get better in the next week. She is handling the MI well and has a good support system, but this has definitely been a change in their everyday life.      Family Dynamics   Good Support System?  Yes      Barriers   Psychosocial barriers to participate in program  There are no identifiable barriers or psychosocial needs.      Screening Interventions   Interventions  Encouraged to exercise    Expected Outcomes  Short Term goal: Utilizing psychosocial counselor, staff and physician to assist with identification of specific Stressors or current issues interfering with healing process. Setting desired goal for each stressor or current issue identified.;Long Term Goal: Stressors or current issues are controlled or eliminated.;Short Term goal: Identification and review with participant of any Quality of Life or Depression concerns found by scoring the questionnaire.;Long Term goal: The participant improves quality of Life and PHQ9 Scores as seen by post scores and/or verbalization of changes       Quality of Life Scores:  Quality of Life - 01/05/19 1357      Quality of Life   Select  Quality of Life      Quality of Life Scores   Health/Function Pre  18.83 %    Socioeconomic Pre  28.38 %    Psych/Spiritual Pre  30 %    Family Pre  27.6 %    GLOBAL Pre  24.5 %      Scores of 19 and below usually indicate a poorer quality of life in these areas.  A difference of  2-3 points is a clinically  meaningful difference.  A difference of 2-3 points in the total score of the Quality of Life Index has been associated with significant improvement in overall quality of life, self-image, physical symptoms, and general health in studies assessing change in quality of life.  PHQ-9: Recent Review Flowsheet Data    Depression screen [facility] 2/9 01/05/2019 04/12/2018 02/23/2017 02/19/2016 08/14/2015   Decreased Interest 1 0 0 0 0   Down, Depressed, Hopeless - 0 0 0 0   PHQ - 2 Score 1 0 0 0 0   Altered sleeping 0 - - - -   Tired, decreased energy 1 - - - -   Change in appetite 1 - - - -   Feeling bad or failure about yourself  0 - - - -   Trouble concentrating 1 - - - -   Moving slowly or fidgety/restless 0 - - - -   Suicidal thoughts 0 - - - -   PHQ-9 Score 4 - - - -   Difficult doing work/chores Not difficult at all - - - -     Interpretation of Total Score  Total Score Depression Severity:  1-4 = Minimal depression, 5-9 = Mild depression, 10-14 = Moderate depression, 15-19 = Moderately severe depression, 20-27 = Severe depression   Psychosocial Evaluation and Intervention:   Psychosocial Re-Evaluation: Psychosocial Re-Evaluation    [place] Name 02/13/19 1514 03/01/19  1457 03/27/19 1503         Psychosocial Re-Evaluation   Current issues with  Current Stress Concerns  Current Stress Concerns  Current Stress Concerns     Comments  Patient stated that she was overwhelmed with all the information she was given while in the hospital. She talked to her doctor about this and he reasurred her she was doing everything she was supposed to be doing and she stated she felt much better emotionally after that. She is sleeping well, but still having to use 2 pillows to help with breathing. Patient does not report any major depression or anxiety symptoms.  Teresa Moyer is doing well in rehab.  Her vision continues to be her biggest stressor and source of stress.  She also gets impatient with herself from time to time.  She has enjoyed coming to class and being amongst people again is nice.  She usually sleeps pretty good but does have nights where she wakes because of her leg pain but they can't get back to sleep.  Teresa Moyer is doing better mentally.  She had good meeting with Dr. Burt Knack last month and is feeling better.  She is getting more patient with herself.  She continues to sleep better being more active.  She has also tried to take less naps.  She voted this past weekend and is ready for the election to be over.     Expected Outcomes  Short: continue to take all medications and attend heart track classes. Long: Continue to maintain and manage mental health and good sleep habits.  Short: Continue to attend class for outlet.  Long: Continue to try not to be so hard on herself.  Short: Continue to get out more to walk for mental health and to get out.  Long: Continue to practice self care     Interventions  Encouraged to attend Cardiac Rehabilitation for the exercise  Encouraged to attend Cardiac Rehabilitation for the exercise  Encouraged to attend Cardiac Rehabilitation for the exercise     Continue Psychosocial Services   Follow up required by staff  Follow up required by staff  Follow up required by staff        Psychosocial Discharge (Final Psychosocial Re-Evaluation): Psychosocial Re-Evaluation - 03/27/19 1503      Psychosocial Re-Evaluation   Current issues with  Current Stress Concerns    Comments  Teresa Moyer is doing better mentally.  She had good meeting with Dr. Burt Knack last month and is feeling better.  She is getting more patient with herself.  She continues to sleep better being more active.  She has also tried to take less naps.  She voted this past weekend and is ready for the election to be over.    Expected Outcomes  Short: Continue to get out more to walk for mental health and to get out.  Long: Continue to practice self care    Interventions  Encouraged to attend Cardiac Rehabilitation for the exercise     Continue Psychosocial Services   Follow up required by staff       Vocational Rehabilitation: Provide vocational rehab assistance to qualifying candidates.   Vocational Rehab Evaluation & Intervention:   Education: Education Goals: Education classes will be provided on a variety of topics geared toward better understanding of heart health and risk factor modification. Participant will state understanding/return demonstration of topics presented as noted by education test scores.  Learning Barriers/Preferences: Learning Barriers/Preferences - 01/02/19 1550      Learning  Barriers/Preferences   Learning Barriers  Sight   macular degeneration   Learning Preferences  None       Education Topics:  AED/CPR: - Group verbal and written instruction with the use of models to demonstrate the basic use of the AED with the basic ABC's of resuscitation.   General Nutrition Guidelines/Fats and Fiber: -Group instruction provided by verbal, written material, models and posters to present the general guidelines for heart healthy nutrition. Gives an explanation and review of dietary fats and fiber.   Controlling Sodium/Reading Food Labels: -Group verbal and written material supporting the discussion of sodium use in heart healthy nutrition. Review and explanation with models, verbal and written materials for utilization of the food label.   Exercise Physiology & General Exercise Guidelines: - Group verbal and written instruction with models to review the exercise physiology of the cardiovascular system and associated critical values. Provides general exercise guidelines with specific guidelines to those with heart or lung disease.    Aerobic Exercise & Resistance Training: - Gives group verbal and written instruction on the various components of exercise. Focuses on aerobic and resistive training programs and the benefits of this training and how to safely progress through these  programs..   Cardiac Rehab from 04/06/2019 in [facility] Cardiac and Pulmonary Rehab  Date  04/06/19  Educator  [facility]  Instruction Review Code  1- Verbalizes Understanding      Flexibility, Balance, Mind/Body Relaxation: Provides group verbal/written instruction on the benefits of flexibility and balance training, including mind/body exercise modes such as yoga, pilates and tai chi.  Demonstration and skill practice provided.   Stress and Anxiety: - Provides group verbal and written instruction about the health risks of elevated stress and causes of high stress.  Discuss the correlation between heart/lung disease and anxiety and treatment options. Review healthy ways to manage with stress and anxiety.   Depression: - Provides group verbal and written instruction on the correlation between heart/lung disease and depressed mood, treatment options, and the stigmas associated with seeking treatment.   Anatomy & Physiology of the Heart: - Group verbal and written instruction and models provide basic cardiac anatomy and physiology, with the coronary electrical and arterial systems. Review of Valvular disease and Heart Failure   Cardiac Procedures: - Group verbal and written instruction to review commonly prescribed medications for heart disease. Reviews the medication, class of the drug, and side effects. Includes the steps to properly store meds and maintain the prescription regimen. (beta blockers and nitrates)   Cardiac Medications I: - Group verbal and written instruction to review commonly prescribed medications for heart disease. Reviews the medication, class of the drug, and side effects. Includes the steps to properly store meds and maintain the prescription regimen.   Cardiac Medications II: -Group verbal and written instruction to review commonly prescribed medications for heart disease. Reviews the medication, class of the drug, and side effects. (all other drug classes)   Cardiac Rehab  from 04/06/2019 in [facility] Cardiac and Pulmonary Rehab  Date  04/06/19  Educator  [facility]  Instruction Review Code  1- Verbalizes Understanding       Go Sex-Intimacy & Heart Disease, Get SMART - Goal Setting: - Group verbal and written instruction through game format to discuss heart disease and the return to sexual intimacy. Provides group verbal and written material to discuss and apply goal setting through the application of the S.M.A.R.T. Method.   Other Matters of the Heart: - Provides group verbal, written materials and models  to describe Stable Angina and Peripheral Artery. Includes description of the disease process and treatment options available to the cardiac patient.   Exercise & Equipment Safety: - Individual verbal instruction and demonstration of equipment use and safety with use of the equipment.   Cardiac Rehab from 04/06/2019 in [facility] Cardiac and Pulmonary Rehab  Date  01/05/19  Educator  [place]  Instruction Review Code  1- Verbalizes Understanding      Infection Prevention: - Provides verbal and written material to individual with discussion of infection control including proper hand washing and proper equipment cleaning during exercise session.   Cardiac Rehab from 04/06/2019 in [facility] Cardiac and Pulmonary Rehab  Date  01/05/19  Educator  [place]  Instruction Review Code  1- Verbalizes Understanding      Falls Prevention: - Provides verbal and written material to individual with discussion of falls prevention and safety.   Cardiac Rehab from 04/06/2019 in [facility] Cardiac and Pulmonary Rehab  Date  01/05/19  Educator  [place]  Instruction Review Code  1- Verbalizes Understanding      Diabetes: - Individual verbal and written instruction to review signs/symptoms of diabetes, desired ranges of glucose level fasting, after meals and with exercise. Acknowledge that pre and post exercise glucose checks will be done for 3 sessions at entry of program.   Know Your Numbers and Risk  Factors: -Group verbal and written instruction about important numbers in your health.  Discussion of what are risk factors and how they play a role in the disease process.  Review of Cholesterol, Blood Pressure, Diabetes, and BMI and the role they play in your overall health.   Cardiac Rehab from 04/06/2019 in [facility] Cardiac and Pulmonary Rehab  Date  04/06/19  Educator  [facility]  Instruction Review Code  1- Verbalizes Understanding      Sleep Hygiene: -Provides group verbal and written instruction about how sleep can affect your health.  Define sleep hygiene, discuss sleep cycles and impact of sleep habits. Review good sleep hygiene tips.    Other: -Provides group and verbal instruction on various topics (see comments)   Knowledge Questionnaire Score: Knowledge Questionnaire Score - 01/05/19 1357      Knowledge Questionnaire Score   Pre Score  25/26 (Exercise question missed)  reviewed correst response with Wells Guiles today. She verbalized understanding of the response.       Core Components/Risk Factors/Patient Goals at Admission: Personal Goals and Risk Factors at Admission - 01/02/19 1549      Core Components/Risk Factors/Patient Goals on Admission   Hypertension  Yes    Intervention  Provide education on lifestyle modifcations including regular physical activity/exercise, weight management, moderate sodium restriction and increased consumption of fresh fruit, vegetables, and low fat dairy, alcohol moderation, and smoking cessation.;Monitor prescription use compliance.    Expected Outcomes  Short Term: Continued assessment and intervention until BP is < 140/43m HG in hypertensive participants. < 130/821mHG in hypertensive participants with diabetes, heart failure or chronic kidney disease.;Long Term: Maintenance of blood pressure at goal levels.    Lipids  Yes    Intervention  Provide education and support for participant on nutrition & aerobic/resistive exercise along with prescribed  medications to achieve LDL <7072mHDL >58m34m  Expected Outcomes  Short Term: Participant states understanding of desired cholesterol values and is compliant with medications prescribed. Participant is following exercise prescription and nutrition guidelines.;Long Term: Cholesterol controlled with medications as prescribed, with individualized exercise RX and with personalized nutrition plan. Value goals:  LDL < 32m, HDL > 40 mg.       Core Components/Risk Factors/Patient Goals Review:  Goals and Risk Factor Review    Row Name 02/13/19 1517 03/01/19 1501 03/27/19 1506         Core Components/Risk Factors/Patient Goals Review   Personal Goals Review  Hypertension;Lipids;Heart Failure  Hypertension;Lipids;Heart Failure;Weight Management/Obesity  Hypertension;Lipids;Heart Failure;Weight Management/Obesity     Review  Patient attended heart track for the first time after being hospitalized for heart failure. She is currently stable and taking all medications to manage fluid and blood pressure.  [name] to work on weight loss and is happy that it is going down.  Her blood pressures are doing well.  She denies any current heart failure symptoms.  She also watches her sodium intake.  [name] to lose weight.  She was down 5 lbs between her heart failure clinic visits.  Her clothes have gotten looser on her!!  Her blood pressures have been getting better.  She has good days and not so good days.  She denies any heart failure symptoms.     Expected Outcomes  Short: continue to take new meds prescribed to manage fluid levels and blood pressure. Weigh every day. Start attending heart track regularly now that she had been cleared to return. Long: Manage heart failure and overall cardiac health with medication management and heart healthy diet and exercise.  Short: Continue to monitor sodium  Long: Continue to manage heart failure.  Short: Continue to work on weight loss.  Long: Continue to manage  heart failure.        Core Components/Risk Factors/Patient Goals at Discharge (Final Review):  Goals and Risk Factor Review - 03/27/19 1506      Core Components/Risk Factors/Patient Goals Review   Personal Goals Review  Hypertension;Lipids;Heart Failure;Weight Management/Obesity    Review  [name] to lose weight.  She was down 5 lbs between her heart failure clinic visits.  Her clothes have gotten looser on her!!  Her blood pressures have been getting better.  She has good days and not so good days.  She denies any heart failure symptoms.    Expected Outcomes  Short: Continue to work on weight loss.  Long: Continue to manage heart failure.       ITP Comments: ITP Comments    Row Name 01/02/19 1556 01/09/19 1440 01/25/19 0626 02/22/19 0635 03/22/19 1241   ITP Comments  Virtual orientation completed. [facility] EP/RD orientation scheduled for 7/23 at pm  First full day of exercise!  Patient was oriented to gym and equipment including functions, settings, policies, and procedures.  Patient's individual exercise prescription and treatment plan were reviewed.  All starting workloads were established based on the results of the 6 minute walk test done at initial orientation visit.  The plan for exercise progression was also introduced and progression will be customized based on patient's performance and goals.  30 Day Review Completed today. Continue with ITP unless changed by Medical Director review.  New to program  30 Day review. Continue with ITP unless directed changes per Medical Director review.  30 day review completed. ITP sent to Dr. MEmily Filbert Medical Director of Cardiac and Pulmonary Rehab. Continue with ITP unless changes are made by physician.  Department closed starting 10/2 until further notice by infection prevention and Health at Work teams for [place]   [place] 04/19/19 0731 020 0009  ITP Comments  30 day review completed. Continue with ITP  sent to Dr. Emily Filbert, Medical Director of Cardiac and Pulmonary Rehab for review , changes as needed and signature.          Comments:

## 2019-04-20 DIAGNOSIS — H353221 Exudative age-related macular degeneration, left eye, with active choroidal neovascularization: Secondary | ICD-10-CM | POA: Diagnosis not present

## 2019-04-20 DIAGNOSIS — H353231 Exudative age-related macular degeneration, bilateral, with active choroidal neovascularization: Secondary | ICD-10-CM | POA: Diagnosis not present

## 2019-04-24 ENCOUNTER — Other Ambulatory Visit: Payer: Self-pay

## 2019-04-24 ENCOUNTER — Encounter: Payer: Medicare Other | Admitting: *Deleted

## 2019-04-24 DIAGNOSIS — I213 ST elevation (STEMI) myocardial infarction of unspecified site: Secondary | ICD-10-CM | POA: Diagnosis not present

## 2019-04-24 NOTE — Progress Notes (Signed)
Daily Session Note  Patient Details  Name: Teresa Moyer MRN: [id number] Date of Birth: 05-Aug-1935 Referring Provider:     Cardiac Rehab from 01/05/2019 in [facility] Cardiac and Pulmonary Rehab  Referring Provider  Nehemiah Massed      Encounter Date: 04/24/2019  Check In: Session Check In - 04/24/19 1434      Check-In   Supervising physician immediately available to respond to emergencies  See telemetry face sheet for immediately available ER MD    Location  [facility]-Cardiac & Pulmonary Rehab    Staff Present  Renita Papa, RN BSN;Jeanna Durrell BS, Exercise Physiologist;Kelly Amedeo Plenty, BS, ACSM CEP, Exercise Physiologist    Virtual Visit  No    Medication changes reported      No    Fall or balance concerns reported     No    Warm-up and Cool-down  Performed on first and last piece of equipment    Resistance Training Performed  Yes    VAD Patient?  No    PAD/SET Patient?  No      Pain Assessment   Currently in Pain?  No/denies          Social History   Tobacco Use  Smoking Status Never Smoker  Smokeless Tobacco Never Used    Goals Met:  Independence with exercise equipment Exercise tolerated well No report of cardiac concerns or symptoms Strength training completed today  Goals Unmet:  Not Applicable  Comments: Pt able to follow exercise prescription today without complaint.  Will continue to monitor for progression.    Dr. Emily Filbert is Medical Director for [place] and [name].

## 2019-04-25 DIAGNOSIS — D225 Melanocytic nevi of trunk: Secondary | ICD-10-CM | POA: Diagnosis not present

## 2019-04-25 DIAGNOSIS — L820 Inflamed seborrheic keratosis: Secondary | ICD-10-CM | POA: Diagnosis not present

## 2019-04-25 DIAGNOSIS — Z85828 Personal history of other malignant neoplasm of skin: Secondary | ICD-10-CM | POA: Diagnosis not present

## 2019-04-25 DIAGNOSIS — D2271 Melanocytic nevi of right lower limb, including hip: Secondary | ICD-10-CM | POA: Diagnosis not present

## 2019-04-25 DIAGNOSIS — L538 Other specified erythematous conditions: Secondary | ICD-10-CM | POA: Diagnosis not present

## 2019-04-25 DIAGNOSIS — X32XXXA Exposure to sunlight, initial encounter: Secondary | ICD-10-CM | POA: Diagnosis not present

## 2019-04-25 DIAGNOSIS — I2510 Atherosclerotic heart disease of native coronary artery without angina pectoris: Secondary | ICD-10-CM | POA: Diagnosis not present

## 2019-04-25 DIAGNOSIS — D2261 Melanocytic nevi of right upper limb, including shoulder: Secondary | ICD-10-CM | POA: Diagnosis not present

## 2019-04-25 DIAGNOSIS — L570 Actinic keratosis: Secondary | ICD-10-CM | POA: Diagnosis not present

## 2019-04-25 DIAGNOSIS — D2262 Melanocytic nevi of left upper limb, including shoulder: Secondary | ICD-10-CM | POA: Diagnosis not present

## 2019-04-26 ENCOUNTER — Encounter: Payer: Medicare Other | Admitting: *Deleted

## 2019-04-26 ENCOUNTER — Other Ambulatory Visit: Payer: Self-pay

## 2019-04-26 DIAGNOSIS — I213 ST elevation (STEMI) myocardial infarction of unspecified site: Secondary | ICD-10-CM

## 2019-04-26 NOTE — Progress Notes (Signed)
Daily Session Note  Patient Details  Name: Teresa Moyer MRN: [id number] Date of Birth: May 02, 1936 Referring Provider:     Cardiac Rehab from 01/05/2019 in [facility] Cardiac and Pulmonary Rehab  Referring Provider  Nehemiah Massed      Encounter Date: 04/26/2019  Check In: Session Check In - 04/26/19 1430      Check-In   Supervising physician immediately available to respond to emergencies  See telemetry face sheet for immediately available ER MD    Location  [facility]-Cardiac & Pulmonary Rehab    Staff Present  Renita Papa, RN BSN;Joseph Hood RCP,RRT,BSRT;Melissa [place] RDN, LDN    Virtual Visit  No    Medication changes reported      No    Fall or balance concerns reported     No    Warm-up and Cool-down  Performed on first and last piece of equipment    Resistance Training Performed  Yes    VAD Patient?  No    PAD/SET Patient?  No      Pain Assessment   Currently in Pain?  No/denies          Social History   Tobacco Use  Smoking Status Never Smoker  Smokeless Tobacco Never Used    Goals Met:  Independence with exercise equipment Exercise tolerated well No report of cardiac concerns or symptoms Strength training completed today  Goals Unmet:  Not Applicable  Comments: Pt able to follow exercise prescription today without complaint.  Will continue to monitor for progression.    Dr. Emily Filbert is Medical Director for [place] and [name].

## 2019-04-27 DIAGNOSIS — H353211 Exudative age-related macular degeneration, right eye, with active choroidal neovascularization: Secondary | ICD-10-CM | POA: Diagnosis not present

## 2019-04-28 DIAGNOSIS — H6123 Impacted cerumen, bilateral: Secondary | ICD-10-CM | POA: Diagnosis not present

## 2019-04-28 DIAGNOSIS — J301 Allergic rhinitis due to pollen: Secondary | ICD-10-CM | POA: Diagnosis not present

## 2019-05-01 ENCOUNTER — Other Ambulatory Visit: Payer: Self-pay

## 2019-05-01 ENCOUNTER — Encounter: Payer: Medicare Other | Admitting: *Deleted

## 2019-05-01 VITALS — Ht 62.0 in | Wt 139.6 lb

## 2019-05-01 DIAGNOSIS — I213 ST elevation (STEMI) myocardial infarction of unspecified site: Secondary | ICD-10-CM

## 2019-05-01 NOTE — Progress Notes (Signed)
Daily Session Note  Patient Details  Name: Teresa Moyer MRN: [id number] Date of Birth: [birth year] Referring Provider:     Cardiac Rehab from 01/05/2019 in [facility] Cardiac and Pulmonary Rehab  Referring Provider  Nehemiah Massed      Encounter Date: 05/01/2019  Check In: Session Check In - 05/01/19 1428      Check-In   Supervising physician immediately available to respond to emergencies  See telemetry face sheet for immediately available ER MD    Location  [facility]-Cardiac & Pulmonary Rehab    Staff Present  Renita Papa, RN BSN;Jessica Pringle, MA, RCEP, CCRP, [place], BS, ACSM CEP, Exercise Physiologist;Joseph Courtland RCP,RRT,BSRT    Virtual Visit  No    Medication changes reported      No    Fall or balance concerns reported     No    Warm-up and Cool-down  Performed on first and last piece of equipment    Resistance Training Performed  Yes    VAD Patient?  No    PAD/SET Patient?  No      Pain Assessment   Currently in Pain?  No/denies          Social History   Tobacco Use  Smoking Status Never Smoker  Smokeless Tobacco Never Used    Goals Met:  Independence with exercise equipment Exercise tolerated well No report of cardiac concerns or symptoms Strength training completed today  Goals Unmet:  Not Applicable  Comments: Pt able to follow exercise prescription today without complaint.  Will continue to monitor for progression.  [place] Name 01/05/19 1432 05/01/19 1449       6 Minute Walk   Phase  Initial  Discharge    Distance  263 feet  715 feet    Distance % Change  -  171.9 %    Distance Feet Change  -  452 ft    Walk Time  2 minutes  5.25 minutes    # of Rest Breaks  -  2 30 sec, 15 sec    MPH  1.49  1.55    METS  0.31  1.35    RPE  13  17    Perceived Dyspnea   3  3    VO2 Peak  1.1  5.05    Symptoms  Yes (comment)  No    Comments  Patient stopped due to shortness of breath which she reports is increased when wearing a mask. SpO2  90% increased to 96%. Patient did not wish to resume test until after time had expired.  SOB    Resting HR  79 bpm  84 bpm    Resting BP  132/80  124/72    Exercise Oxygen Saturation  during 6 min walk  90 %  -    Max Ex. HR  84 bpm  105 bpm    Max Ex. BP  146/80  146/74    2 Minute Post BP  122/68  -        Dr. Emily Filbert is Medical Director for [place] and [name].

## 2019-05-02 NOTE — Patient Instructions (Signed)
Discharge Patient Instructions  Patient Details  Name: Teresa Moyer MRN: [id number] Date of Birth: 27-Aug-1935 Referring Provider:  Corey Skains, MD   Number of Visits: 40  Reason for Discharge:  Patient reached a stable level of exercise. Patient independent in their exercise. Patient has met program and personal goals.  Smoking History:  Social History   Tobacco Use  Smoking Status Never Smoker  Smokeless Tobacco Never Used    Diagnosis:  ST elevation myocardial infarction (STEMI), unspecified artery (HCC)  Initial Exercise Prescription: Initial Exercise Prescription - 01/05/19 1400      Date of Initial Exercise RX and Referring Provider   Date  01/05/19    Referring Provider  Teresa Moyer      Treadmill   MPH  1    Grade  0    Minutes  15    METs  0.5      Recumbant Bike   Level  1    RPM  50    Watts  25    Minutes  15    METs  1      NuStep   Level  1    SPM  50    Minutes  15    METs  1      Arm Ergometer   Level  1    Watts  10    RPM  10    Minutes  15    METs  1      Biostep-RELP   Level  1    SPM  30    Minutes  15    METs  1      Prescription Details   Frequency (times per week)  3-5    Duration  Progress to 30 minutes of continuous aerobic without signs/symptoms of physical distress      Intensity   THRR 40-80% of Max Heartrate  103-127    Ratings of Perceived Exertion  11-13    Perceived Dyspnea  0-4      Progression   Progression  Continue progressive overload as per policy without signs/symptoms or physical distress.      Resistance Training   Training Prescription  Yes    Weight  2    Reps  10-15       Discharge Exercise Prescription (Final Exercise Prescription Changes): Exercise Prescription Changes - 04/04/19 1100      Response to Exercise   Blood Pressure (Admit)  142/82    Blood Pressure (Exercise)  168/82    Blood Pressure (Exit)  150/80    Heart Rate (Admit)  80 bpm    Heart Rate (Exercise)  112 bpm     Heart Rate (Exit)  72 bpm    Rating of Perceived Exertion (Exercise)  14    Symptoms  none    Duration  Continue with 30 min of aerobic exercise without signs/symptoms of physical distress.    Intensity  THRR unchanged      Progression   Progression  Continue to progress workloads to maintain intensity without signs/symptoms of physical distress.    Average METs  2.25      Resistance Training   Training Prescription  Yes    Weight  3 lbs    Reps  10-15      Interval Training   Interval Training  No      Treadmill   MPH  1.6    Grade  1    Minutes  15  METs  2.45      NuStep   Level  4    Minutes  15    METs  2      Home Exercise Plan   Plans to continue exercise at  Home (comment)   walking, videos   Frequency  Add 2 additional days to program exercise sessions.    Initial Home Exercises Provided  02/27/19       Functional Capacity: 6 Minute Walk    Row Name 01/05/19 1432 05/01/19 1449       6 Minute Walk   Phase  Initial  Discharge    Distance  263 feet  715 feet    Distance % Change  -  171.9 %    Distance Feet Change  -  452 ft    Walk Time  2 minutes  5.25 minutes    # of Rest Breaks  -  2 30 sec, 15 sec    MPH  1.49  1.55    METS  0.31  1.35    RPE  13  17    Perceived Dyspnea   3  3    VO2 Peak  1.1  5.05    Symptoms  Yes (comment)  No    Comments  Patient stopped due to shortness of breath which she reports is increased when wearing a mask. SpO2 90% increased to 96%. Patient did not wish to resume test until after time had expired.  SOB    Resting HR  79 bpm  84 bpm    Resting BP  132/80  124/72    Exercise Oxygen Saturation  during 6 min walk  90 %  -    Max Ex. HR  84 bpm  105 bpm    Max Ex. BP  146/80  146/74    2 Minute Post BP  122/68  -       Quality of Life: Quality of Life - 01/05/19 1357      Quality of Life   Select  Quality of Life      Quality of Life Scores   Health/Function Pre  18.83 %    Socioeconomic Pre  28.38 %     Psych/Spiritual Pre  30 %    Family Pre  27.6 %    GLOBAL Pre  24.5 %       Personal Goals: Goals established at orientation with interventions provided to work toward goal. Personal Goals and Risk Factors at Admission - 01/02/19 1549      Core Components/Risk Factors/Patient Goals on Admission   Hypertension  Yes    Intervention  Provide education on lifestyle modifcations including regular physical activity/exercise, weight management, moderate sodium restriction and increased consumption of fresh fruit, vegetables, and low fat dairy, alcohol moderation, and smoking cessation.;Monitor prescription use compliance.    Expected Outcomes  Short Term: Continued assessment and intervention until BP is < 140/27m HG in hypertensive participants. < 130/856mHG in hypertensive participants with diabetes, heart failure or chronic kidney disease.;Long Term: Maintenance of blood pressure at goal levels.    Lipids  Yes    Intervention  Provide education and support for participant on nutrition & aerobic/resistive exercise along with prescribed medications to achieve LDL <7049mHDL >27m75m  Expected Outcomes  Short Term: Participant states understanding of desired cholesterol values and is compliant with medications prescribed. Participant is following exercise prescription and nutrition guidelines.;Long Term: Cholesterol controlled with medications as prescribed, with individualized exercise  RX and with personalized nutrition plan. Value goals: LDL < 73m, HDL > 40 mg.        Personal Goals Discharge: Goals and Risk Factor Review - 03/27/19 1506      Core Components/Risk Factors/Patient Goals Review   Personal Goals Review  Hypertension;Lipids;Heart Failure;Weight Management/Obesity    Review  Teresa Larsencontinues to lose weight.  She was down 5 lbs between her heart failure clinic visits.  Her clothes have gotten looser on her!!  Her blood pressures have been getting better.  She has good days and not  so good days.  She denies any heart failure symptoms.    Expected Outcomes  Short: Continue to work on weight loss.  Long: Continue to manage heart failure.       Exercise Goals and Review: Exercise Goals    Row Name 01/05/19 1441             Exercise Goals   Increase Physical Activity  Yes       Intervention  Provide advice, education, support and counseling about physical activity/exercise needs.;Develop an individualized exercise prescription for aerobic and resistive training based on initial evaluation findings, risk stratification, comorbidities and participant's personal goals.       Expected Outcomes  Short Term: Attend rehab on a regular basis to increase amount of physical activity.;Long Term: Add in home exercise to make exercise part of routine and to increase amount of physical activity.;Long Term: Exercising regularly at least 3-5 days a week.       Increase Strength and Stamina  Yes       Intervention  Provide advice, education, support and counseling about physical activity/exercise needs.;Develop an individualized exercise prescription for aerobic and resistive training based on initial evaluation findings, risk stratification, comorbidities and participant's personal goals.       Expected Outcomes  Short Term: Increase workloads from initial exercise prescription for resistance, speed, and METs.;Short Term: Perform resistance training exercises routinely during rehab and add in resistance training at home;Long Term: Improve cardiorespiratory fitness, muscular endurance and strength as measured by increased METs and functional capacity (6MWT)       Able to understand and use rate of perceived exertion (RPE) scale  Yes       Intervention  Provide education and explanation on how to use RPE scale       Expected Outcomes  Short Term: Able to use RPE daily in rehab to express subjective intensity level;Long Term:  Able to use RPE to guide intensity level when exercising  independently       Intervention  Provide education and explanation on how to use Dyspnea scale       Expected Outcomes  Short Term: Able to use Dyspnea scale daily in rehab to express subjective sense of shortness of breath during exertion;Long Term: Able to use Dyspnea scale to guide intensity level when exercising independently       Intervention  Provide education, explanation, and written materials on patient's individual exercise prescription          Exercise Goals Re-Evaluation: Exercise Goals Re-Evaluation    Row Name 01/09/19 1440 01/16/19 1149 02/02/19 1501 02/13/19 1522 02/21/19 1431     Exercise Goal Re-Evaluation   Exercise Goals Review  Able to understand and use rate of perceived exertion (RPE) scale;Increase Physical Activity;Knowledge and understanding of Target Heart Rate Range (THRR);Understanding of Exercise Prescription;Increase Strength and Stamina;Able to check pulse independently  Increase Physical Activity;Increase Strength and Stamina;Understanding of Exercise Prescription  Increase Physical Activity;Increase Strength and Stamina;Understanding of Exercise Prescription  Increase Physical Activity;Increase Strength and Stamina;Understanding of Exercise Prescription  Increase Physical Activity;Increase Strength and Stamina;Understanding of Exercise Prescription   Comments  Reviewed RPE scale, THR and program prescription with pt today.  Pt voiced understanding and was given a copy of goals to take home.  Jacqlyn Moyer is off to a good start in rehab.  She has completed two full days of exercise.  We will continue to monitor her progress.  Jacqlyn Moyer has been doing well in rehab.  She was sent to ED and admitted on Monday after exercise as she was very SOB and sats kept dropping.  She will need clearance to return to rehab.  Today was [name]'s first day back to heart track after being hospitalized for heart failure. She tolerated exercise well today and her blood pressure, heart rate, and  oxygen levels were all within acceptable ranges. [name]'s SOB was much better today.  She started out easy and plans to come consistantly and build back strength.  Jacqlyn Moyer is doing well in rehab.  She was able to return and pick back up where she was previously.  She is up to 2 METs on the arm crank and BioStep.  We will move her up at the next visit and continue to monitor her progress.   Expected Outcomes  Short: Use RPE daily to regulate intensity. Long: Follow program prescription in THR.  Short: Continue to attend regularly Long: Continue to follow program prescription.  Short: Cleared to return to rehab.  Long: Continue to increase stamina.  Short: Start attending heart track regularly and weigh every day to help manage heart failure. Long: as tolerated, incrementally increase workloads to work back to previous levels.  Short: Increase BioStep.  Long: Continue to increase stamina.   [place] Name 02/27/19 1506 03/01/19 1453 03/09/19 1408 03/27/19 1502 04/04/19 1105     Exercise Goal Re-Evaluation   Exercise Goals Review  Increase Physical Activity;Increase Strength and Stamina;Understanding of Exercise Prescription;Able to understand and use rate of perceived exertion (RPE) scale;Knowledge and understanding of Target Heart Rate Range (THRR);Able to check pulse independently  Increase Physical Activity;Increase Strength and Stamina;Understanding of Exercise Prescription  Increase Physical Activity;Increase Strength and Stamina;Able to understand and use rate of perceived exertion (RPE) scale;Knowledge and understanding of Target Heart Rate Range (THRR);Able to check pulse independently;Understanding of Exercise Prescription  Increase Strength and Stamina;Increase Physical Activity;Understanding of Exercise Prescription  Increase Strength and Stamina;Increase Physical Activity;Understanding of Exercise Prescription   Comments  Reviewed home exercise with pt today.  Pt plans to walk and use videos at home for  exercise.  Reviewed THR, pulse, RPE, sign and symptoms, NTG use, and when to call 911 or MD.  Also discussed weather considerations and indoor options.  Pt voiced understanding.  Jacqlyn Moyer is doing well in rehab.  She has tried to get to the videos, but they could only find one.  She is going to try again to see if they can find all of them.  She is feeling stronger and to the point that she asked to use the treadmill today!!  Jacqlyn Moyer has been walking on TM more consistently.  She has also increased level on NS.  Staff will monitor progress.  Jacqlyn Moyer is doing well in rehab.  She is has been doing her videos and some walking at home.  The walking usually comes from her shopping as its more motivating and flatter.  She does feel a lot stronger overall.  Jacqlyn Moyer continues to do well in rehab.  She is enjoying the treadmill and her incline.  She continues to hold steady.  We will continue to monitor her progress.   Expected Outcomes  Short: Start to add in exercise at home.  Long: Continue to increase stamina.  Short: Continue to try to access the YouTube videos.  Long: Continue to increase stamina.  Short - continue to walk consistently Long - build stamina  Short: Continue to walk more at home.  Long: Continue to build her stamina.  Short: Walk more at home.  Long: Continue to build stamina.      Nutrition & Weight - Outcomes: Pre Biometrics - 01/05/19 1436      Pre Biometrics   Height  '5\' 2"'  (1.575 m)    Weight  146 lb (66.2 kg)    BMI (Calculated)  26.7    Single Leg Stand  2.65 seconds      Post Biometrics - 05/01/19 1452       Post  Biometrics   Height  '5\' 2"'  (1.575 m)    Weight  139 lb 9.6 oz (63.3 kg)    BMI (Calculated)  25.53       Nutrition: Nutrition Therapy & Goals - 01/05/19 1328      Nutrition Therapy   Diet  Low Na, HH diet    Protein (specify units)  55g    Fiber  25 grams    Whole Grain Foods  3 servings    Saturated Fats  12 max. grams    Fruits and Vegetables  5 servings/day     Sodium  1.5 grams      Personal Nutrition Goals   Nutrition Goal  ST: wants to think over everything LT: eat healthier overall    Comments  Pt B: black coffee, oatmeal, toast and smart balance. L: sandwich (white wheat) - tomato with mayo, salt/pepper. D: meat/chicken, vegetables, potatoes. (will switch between brown and white rice), whole wheat pasta, does not like whole wheat bread (suggested bread brands I like to try). Pt will have 1 diet pepsi and snack on chips and regular popcorn (talked about switching to poppers instead of chips (pt asked). Pt will have small ice cream or dark chocolate kisses. Pt drinks almond milk (dairy like feta can bother her), pt will use 1% milk in cooking, and will sometimes have whole eggs as part of her meal. Pt also reports sice quarentine going to [employer] 3x per week for a meal. Discussed HH eating and some meal ideas (pt doesn't know about what to do for lunch).      Intervention Plan   Intervention  Prescribe, educate and counsel regarding individualized specific dietary modifications aiming towards targeted core components such as weight, hypertension, lipid management, diabetes, heart failure and other comorbidities.;Nutrition handout(s) given to patient.    Expected Outcomes  Short Term Goal: Understand basic principles of dietary content, such as calories, fat, sodium, cholesterol and nutrients.;Short Term Goal: A plan has been developed with personal nutrition goals set during dietitian appointment.;Long Term Goal: Adherence to prescribed nutrition plan.       Nutrition Discharge: Nutrition Assessments - 01/05/19 1506      MEDFICTS Scores   Pre Score  28       Education Questionnaire Score: Knowledge Questionnaire Score - 01/05/19 1357      Knowledge Questionnaire Score   Pre Score  25/26 (Exercise question missed)  reviewed correst response with Wells Guiles today. She verbalized understanding  of the response.       Goals reviewed with  patient; copy given to patient.

## 2019-05-03 ENCOUNTER — Encounter: Payer: Medicare Other | Admitting: *Deleted

## 2019-05-03 ENCOUNTER — Other Ambulatory Visit: Payer: Self-pay

## 2019-05-03 DIAGNOSIS — I213 ST elevation (STEMI) myocardial infarction of unspecified site: Secondary | ICD-10-CM | POA: Diagnosis not present

## 2019-05-03 NOTE — Progress Notes (Signed)
Daily Session Note  Patient Details  Name: Teresa Moyer MRN: [id number] Date of Birth: 1935-09-20 Referring Provider:     Cardiac Rehab from 01/05/2019 in [facility]  Referring Provider  Nehemiah Massed      Encounter Date: 05/03/2019  Check In: Session Check In - 05/03/19 1435      Check-In   Supervising physician immediately available to respond to emergencies  See telemetry face sheet for immediately available ER MD    Location  [facility]    Staff Present  Renita Papa, RN Vickki Hearing, BA, ACSM CEP, Exercise Physiologist;Melissa Caiola RDN, LDN    Virtual Visit  No    Medication changes reported      No    Fall or balance concerns reported     No    Warm-up and Cool-down  Performed on first and last piece of equipment    Resistance Training Performed  Yes    VAD Patient?  No    PAD/SET Patient?  No      Pain Assessment   Currently in Pain?  No/denies          Social History   Tobacco Use  Smoking Status Never Smoker  Smokeless Tobacco Never Used    Goals Met:  Independence with exercise equipment Exercise tolerated well No report of cardiac concerns or symptoms Strength training completed today  Goals Unmet:  Not Applicable  Comments: Pt able to follow exercise prescription today without complaint.  Will continue to monitor for progression.    Dr. Emily Filbert is Medical Director for [name].

## 2019-05-04 ENCOUNTER — Encounter: Payer: Medicare Other | Admitting: *Deleted

## 2019-05-04 DIAGNOSIS — I213 ST elevation (STEMI) myocardial infarction of unspecified site: Secondary | ICD-10-CM | POA: Diagnosis not present

## 2019-05-04 NOTE — Progress Notes (Signed)
Cardiac Individual Treatment Plan  Patient Details  Name: Teresa Moyer MRN: [id number] Date of Birth: 08-23-1935 Referring Provider:     Cardiac Rehab from 01/05/2019 in [facility] ([place] Cardiac and Pulmonary Rehab  Referring Provider  Nehemiah Massed      Initial Encounter Date:    Cardiac Rehab from 01/05/2019 in [facility] Cardiac and Pulmonary Rehab  Date  01/05/19      Visit Diagnosis: ST elevation myocardial infarction (STEMI), unspecified artery ([place])  Patient's Home Medications on Admission:  Current Outpatient Medications:  .  acetaminophen (TYLENOL) 650 MG CR tablet, Take 650 mg by mouth every 8 (eight) hours as needed for pain., Disp: , Rfl:  .  aspirin 81 MG chewable tablet, Chew 1 tablet (81 mg total) by mouth daily., Disp: 30 tablet, Rfl: 0 .  atorvastatin (LIPITOR) 40 MG tablet, Take 1 tablet (40 mg total) by mouth daily at 6 PM., Disp: 30 tablet, Rfl: 0 .  azelastine (ASTELIN) 0.1 % nasal spray, Place 1 spray into both nostrils at bedtime. , Disp: , Rfl:  .  Carboxymethylcell-Hypromellose (GENTEAL) 0.25-0.3 % GEL, Apply 1 application to eye at bedtime., Disp: , Rfl:  .  carvedilol (COREG) 6.25 MG tablet, Take 1 tablet (6.25 mg total) by mouth 2 (two) times daily with a meal., Disp: 180 tablet, Rfl: 3 .  cetirizine (ZYRTEC ALLERGY) 10 MG tablet, Take 10 mg by mouth at bedtime. , Disp: , Rfl:  .  clopidogrel (PLAVIX) 75 MG tablet, Take 1 tablet by mouth daily., Disp: , Rfl:  .  fluticasone (FLONASE) 50 MCG/ACT nasal spray, Place 1 spray into both nostrils daily. , Disp: , Rfl:  .  furosemide (LASIX) 20 MG tablet, Take 1 tablet (20 mg total) by mouth daily. Take 40 mg daily for 3 days,till Friday,after that resume 20 mg daily, Disp: 30 tablet, Rfl: 1 .  irbesartan (AVAPRO) 300 MG tablet, Take 0.5 tablets (150 mg total) by mouth daily., Disp: 30 tablet, Rfl: 0 .  Multiple Vitamins-Minerals (PRESERVISION AREDS PO), Take 1 tablet by mouth 2 (two) times daily. , Disp: , Rfl:  .  Nutritional  Supplements (JOINT FORMULA PO), Take 1 capsule by mouth daily before breakfast. Arthrozene, Disp: , Rfl:  .  omeprazole (PRILOSEC) 20 MG capsule, Take 1 capsule (20 mg total) by mouth daily., Disp: 90 capsule, Rfl: 3 .  Polyethyl Glycol-Propyl Glycol (SYSTANE) 0.4-0.3 % SOLN, Place 1 drop into both eyes 3 (three) times daily as needed (for dry eyes)., Disp: , Rfl:  .  potassium chloride (KLOR-CON 10) 10 MEQ tablet, Take 1 tablet (10 mEq total) by mouth daily., Disp: 30 tablet, Rfl: 0 .  Probiotic Product (ALIGN) 4 MG CAPS, Take 4 mg by mouth daily after lunch. , Disp: , Rfl:   Past Medical History: Past Medical History:  Diagnosis Date  . Cataracts, bilateral   . Environmental allergies   . GERD (gastroesophageal reflux disease)   . Heart murmur 2019   had since a child. dr. Nehemiah Massed follows d/t getting louder  . Hemorrhoids   . History of chickenpox   . Hypertension   . Incontinence in female   . Osteoarthritis   . Skin cancer 12/2017   BCC. nose removed from nose last week.  shoulder squamos cell removed previously  . Type O blood, Rh negative 12/2017   patient requests that this be present on her chart    Tobacco Use: Social History   Tobacco Use  Smoking Status Never Smoker  Smokeless Tobacco Never  Used    Labs: Recent Chemical engineer    Labs for ITP Cardiac and Pulmonary Rehab Latest Ref Rng & Units 09/01/2016 07/01/2017 11/18/2018 12/28/2018 01/11/2019   Cholestrol 100 - 199 mg/dL 171 167 163 - 112   LDLCALC 0 - 99 mg/dL 66 61 61 - 28   HDL >39 mg/dL 74 66 77 - 61   Trlycerides 0 - 149 mg/dL 157(H) 199(H) 127 - 113   Hemoglobin A1c 4.8 - 5.6 % - - - 5.8(H) -       Exercise Target Goals: Exercise Program Goal: Individual exercise prescription set using results from initial 6 min walk test and THRR while considering  patient's activity barriers and safety.   Exercise Prescription Goal: Initial exercise prescription builds to 30-45 minutes a day of aerobic  activity, 2-3 days per week.  Home exercise guidelines will be given to patient during program as part of exercise prescription that the participant will acknowledge.  Activity Barriers & Risk Stratification: Activity Barriers & Cardiac Risk Stratification - 01/02/19 1553      Activity Barriers & Cardiac Risk Stratification   Activity Barriers  Arthritis    Cardiac Risk Stratification  Moderate       6 Minute Walk: 6 Minute Walk    Row Name 01/05/19 1432 05/01/19 1449       6 Minute Walk   Phase  Initial  Discharge    Distance  263 feet  715 feet    Distance % Change  -  171.9 %    Distance Feet Change  -  452 ft    Walk Time  2 minutes  5.25 minutes    # of Rest Breaks  -  2 30 sec, 15 sec    MPH  1.49  1.55    METS  0.31  1.35    RPE  13  17    Perceived Dyspnea   3  3    VO2 Peak  1.1  5.05    Symptoms  Yes (comment)  No    Comments  Patient stopped due to shortness of breath which she reports is increased when wearing a mask. SpO2 90% increased to 96%. Patient did not wish to resume test until after time had expired.  SOB    Resting HR  79 bpm  84 bpm    Resting BP  132/80  124/72    Exercise Oxygen Saturation  during 6 min walk  90 %  -    Max Ex. HR  84 bpm  105 bpm    Max Ex. BP  146/80  146/74    2 Minute Post BP  122/68  -       Oxygen Initial Assessment:   Oxygen Re-Evaluation:   Oxygen Discharge (Final Oxygen Re-Evaluation):   Initial Exercise Prescription: Initial Exercise Prescription - 01/05/19 1400      Date of Initial Exercise RX and Referring Provider   Date  01/05/19    Referring Provider  Nehemiah Massed      Treadmill   MPH  1    Grade  0    Minutes  15    METs  0.5      Recumbant Bike   Level  1    RPM  50    Watts  25    Minutes  15    METs  1      NuStep   Level  1    SPM  50  Minutes  15    METs  1      Arm Ergometer   Level  1    Watts  10    RPM  10    Minutes  15    METs  1      Biostep-RELP   Level  1    SPM  30     Minutes  15    METs  1      Prescription Details   Frequency (times per week)  3-5    Duration  Progress to 30 minutes of continuous aerobic without signs/symptoms of physical distress      Intensity   THRR 40-80% of Max Heartrate  103-127    Ratings of Perceived Exertion  11-13    Perceived Dyspnea  0-4      Progression   Progression  Continue progressive overload as per policy without signs/symptoms or physical distress.      Resistance Training   Training Prescription  Yes    Weight  2    Reps  10-15       Perform Capillary Blood Glucose checks as needed.  Exercise Prescription Changes: Exercise Prescription Changes    Row Name 01/16/19 1100 02/02/19 1500 02/21/19 1400 02/27/19 1500 03/09/19 1400     Response to Exercise   Blood Pressure (Admit)  130/70  142/80  134/70  -  122/68   Blood Pressure (Exercise)  124/70  150/80  146/78  -  150/68   Blood Pressure (Exit)  126/70  124/78  136/78  -  130/62   Heart Rate (Admit)  86 bpm  94 bpm  85 bpm  -  48 bpm   Heart Rate (Exercise)  108 bpm  117 bpm  115 bpm  -  110 bpm   Heart Rate (Exit)  93 bpm  85 bpm  85 bpm  -  82 bpm   Rating of Perceived Exertion (Exercise)  '11  13  15  '$ -  14   Symptoms  none  none  none  -  none   Duration  Continue with 30 min of aerobic exercise without signs/symptoms of physical distress.  Continue with 30 min of aerobic exercise without signs/symptoms of physical distress.  Continue with 30 min of aerobic exercise without signs/symptoms of physical distress.  -  Continue with 30 min of aerobic exercise without signs/symptoms of physical distress.   Intensity  THRR unchanged  THRR unchanged  THRR unchanged  -  THRR unchanged     Progression   Progression  Continue to progress workloads to maintain intensity without signs/symptoms of physical distress.  Continue to progress workloads to maintain intensity without signs/symptoms of physical distress.  Continue to progress workloads to maintain  intensity without signs/symptoms of physical distress.  -  Continue to progress workloads to maintain intensity without signs/symptoms of physical distress.   Average METs  1.53  1.47  1.73  -  2.3     Resistance Training   Training Prescription  Yes  Yes  Yes  -  Yes   Weight  3 lbs  3 lbs  3 lbs  -  3 lb   Reps  10-15  10-15  10-15  -  10-15     Interval Training   Interval Training  No  No  No  -  No     Treadmill   MPH  1  0.8  -  -  1.6   Grade  0  0  -  -  1   Minutes  15  15  -  -  15   METs  1.77  1.7  -  -  2.45     NuStep   Level  '1  1  1  '$ -  3   SPM  -  -  -  -  80   Minutes  '15  15  15  '$ -  15   METs  1.3  1.7  1.2  -  2     Arm Ergometer   Level  -  1  1  -  -   Minutes  -  15  15  -  -   METs  -  -  2  -  -     Biostep-RELP   Level  -  1  1  -  1   Minutes  -  15  15  -  15   METs  -  1  2  -  -     Home Exercise Plan   Plans to continue exercise at  -  -  -  Home (comment) walking, videos  -   Frequency  -  -  -  Add 2 additional days to program exercise sessions.  -   Initial Home Exercises Provided  -  -  -  02/27/19  -   [place] Name 03/28/19 1300 04/04/19 1100           Response to Exercise   Blood Pressure (Admit)  112/66  142/82      Blood Pressure (Exercise)  128/64  168/82      Blood Pressure (Exit)  138/70  150/80      Heart Rate (Admit)  88 bpm  80 bpm      Heart Rate (Exercise)  112 bpm  112 bpm      Heart Rate (Exit)  85 bpm  72 bpm      Rating of Perceived Exertion (Exercise)  12  14      Symptoms  none  none      Duration  Continue with 30 min of aerobic exercise without signs/symptoms of physical distress.  Continue with 30 min of aerobic exercise without signs/symptoms of physical distress.      Intensity  THRR unchanged  THRR unchanged        Progression   Progression  Continue to progress workloads to maintain intensity without signs/symptoms of physical distress.  Continue to progress workloads to maintain intensity without  signs/symptoms of physical distress.      Average METs  2.25  2.25        Resistance Training   Training Prescription  Yes  Yes      Weight  3 lbs  3 lbs      Reps  10-15  10-15        Interval Training   Interval Training  No  No        Treadmill   MPH  1.6  1.6      Grade  1  1      Minutes  15  15      METs  2.45  2.45        NuStep   Level  4  4      Minutes  15  15      METs  2  2  Home Exercise Plan   Plans to continue exercise at  Home (comment) walking, videos  Home (comment) walking, videos      Frequency  Add 2 additional days to program exercise sessions.  Add 2 additional days to program exercise sessions.      Initial Home Exercises Provided  02/27/19  02/27/19         Exercise Comments:   Exercise Goals and Review: Exercise Goals    Row Name 01/05/19 1441             Exercise Goals   Increase Physical Activity  Yes       Intervention  Provide advice, education, support and counseling about physical activity/exercise needs.;Develop an individualized exercise prescription for aerobic and resistive training based on initial evaluation findings, risk stratification, comorbidities and participant's personal goals.       Expected Outcomes  Short Term: Attend rehab on a regular basis to increase amount of physical activity.;Long Term: Add in home exercise to make exercise part of routine and to increase amount of physical activity.;Long Term: Exercising regularly at least 3-5 days a week.       Increase Strength and Stamina  Yes       Intervention  Provide advice, education, support and counseling about physical activity/exercise needs.;Develop an individualized exercise prescription for aerobic and resistive training based on initial evaluation findings, risk stratification, comorbidities and participant's personal goals.       Expected Outcomes  Short Term: Increase workloads from initial exercise prescription for resistance, speed, and METs.;Short Term:  Perform resistance training exercises routinely during rehab and add in resistance training at home;Long Term: Improve cardiorespiratory fitness, muscular endurance and strength as measured by increased METs and functional capacity (6MWT)       Able to understand and use rate of perceived exertion (RPE) scale  Yes       Intervention  Provide education and explanation on how to use RPE scale       Expected Outcomes  Short Term: Able to use RPE daily in rehab to express subjective intensity level;Long Term:  Able to use RPE to guide intensity level when exercising independently       Intervention  Provide education and explanation on how to use Dyspnea scale       Expected Outcomes  Short Term: Able to use Dyspnea scale daily in rehab to express subjective sense of shortness of breath during exertion;Long Term: Able to use Dyspnea scale to guide intensity level when exercising independently       Intervention  Provide education, explanation, and written materials on patient's individual exercise prescription          Exercise Goals Re-Evaluation : Exercise Goals Re-Evaluation    Row Name 01/09/19 1440 01/16/19 1149 02/02/19 1501 02/13/19 1522 02/21/19 1431     Exercise Goal Re-Evaluation   Exercise Goals Review  Able to understand and use rate of perceived exertion (RPE) scale;Increase Physical Activity;Knowledge and understanding of Target Heart Rate Range (THRR);Understanding of Exercise Prescription;Increase Strength and Stamina;Able to check pulse independently  Increase Physical Activity;Increase Strength and Stamina;Understanding of Exercise Prescription  Increase Physical Activity;Increase Strength and Stamina;Understanding of Exercise Prescription  Increase Physical Activity;Increase Strength and Stamina;Understanding of Exercise Prescription  Increase Physical Activity;Increase Strength and Stamina;Understanding of Exercise Prescription   Comments  Reviewed RPE scale, THR and program  prescription with pt today.  Pt voiced understanding and was given a copy of goals to take home.  Jacqlyn Larsen is off to a  good start in rehab.  She has completed two full days of exercise.  We will continue to monitor her progress.  Jacqlyn Larsen has been doing well in rehab.  She was sent to ED and admitted on Monday after exercise as she was very SOB and sats kept dropping.  She will need clearance to return to rehab.  Today was [name]'s first day back to heart track after being hospitalized for heart failure. She tolerated exercise well today and her blood pressure, heart rate, and oxygen levels were all within acceptable ranges. [name]'s SOB was much better today.  She started out easy and plans to come consistantly and build back strength.  Jacqlyn Larsen is doing well in rehab.  She was able to return and pick back up where she was previously.  She is up to 2 METs on the arm crank and BioStep.  We will move her up at the next visit and continue to monitor her progress.   Expected Outcomes  Short: Use RPE daily to regulate intensity. Long: Follow program prescription in THR.  Short: Continue to attend regularly Long: Continue to follow program prescription.  Short: Cleared to return to rehab.  Long: Continue to increase stamina.  Short: Start attending heart track regularly and weigh every day to help manage heart failure. Long: as tolerated, incrementally increase workloads to work back to previous levels.  Short: Increase BioStep.  Long: Continue to increase stamina.   [place] Name 02/27/19 1506 03/01/19 1453 03/09/19 1408 03/27/19 1502 04/04/19 1105     Exercise Goal Re-Evaluation   Exercise Goals Review  Increase Physical Activity;Increase Strength and Stamina;Understanding of Exercise Prescription;Able to understand and use rate of perceived exertion (RPE) scale;Knowledge and understanding of Target Heart Rate Range (THRR);Able to check pulse independently  Increase Physical Activity;Increase Strength and Stamina;Understanding of  Exercise Prescription  Increase Physical Activity;Increase Strength and Stamina;Able to understand and use rate of perceived exertion (RPE) scale;Knowledge and understanding of Target Heart Rate Range (THRR);Able to check pulse independently;Understanding of Exercise Prescription  Increase Strength and Stamina;Increase Physical Activity;Understanding of Exercise Prescription  Increase Strength and Stamina;Increase Physical Activity;Understanding of Exercise Prescription   Comments  Reviewed home exercise with pt today.  Pt plans to walk and use videos at home for exercise.  Reviewed THR, pulse, RPE, sign and symptoms, NTG use, and when to call 911 or MD.  Also discussed weather considerations and indoor options.  Pt voiced understanding.  Jacqlyn Larsen is doing well in rehab.  She has tried to get to the videos, but they could only find one.  She is going to try again to see if they can find all of them.  She is feeling stronger and to the point that she asked to use the treadmill today!!  Jacqlyn Larsen has been walking on TM more consistently.  She has also increased level on NS.  Staff will monitor progress.  Jacqlyn Larsen is doing well in rehab.  She is has been doing her videos and some walking at home.  The walking usually comes from her shopping as its more motivating and flatter.  She does feel a lot stronger overall.  Jacqlyn Larsen continues to do well in rehab.  She is enjoying the treadmill and her incline.  She continues to hold steady.  We will continue to monitor her progress.   Expected Outcomes  Short: Start to add in exercise at home.  Long: Continue to increase stamina.  Short: Continue to try to access the YouTube videos.  Long: Continue to  increase stamina.  Short - continue to walk consistently Long - build stamina  Short: Continue to walk more at home.  Long: Continue to build her stamina.  Short: Walk more at home.  Long: Continue to build stamina.      Discharge Exercise Prescription (Final Exercise Prescription  Changes): Exercise Prescription Changes - 04/04/19 1100      Response to Exercise   Blood Pressure (Admit)  142/82    Blood Pressure (Exercise)  168/82    Blood Pressure (Exit)  150/80    Heart Rate (Admit)  80 bpm    Heart Rate (Exercise)  112 bpm    Heart Rate (Exit)  72 bpm    Rating of Perceived Exertion (Exercise)  14    Symptoms  none    Duration  Continue with 30 min of aerobic exercise without signs/symptoms of physical distress.    Intensity  THRR unchanged      Progression   Progression  Continue to progress workloads to maintain intensity without signs/symptoms of physical distress.    Average METs  2.25      Resistance Training   Training Prescription  Yes    Weight  3 lbs    Reps  10-15      Interval Training   Interval Training  No      Treadmill   MPH  1.6    Grade  1    Minutes  15    METs  2.45      NuStep   Level  4    Minutes  15    METs  2      Home Exercise Plan   Plans to continue exercise at  Home (comment)   walking, videos   Frequency  Add 2 additional days to program exercise sessions.    Initial Home Exercises Provided  02/27/19       Nutrition:  Target Goals: Understanding of nutrition guidelines, daily intake of sodium '1500mg'$ , cholesterol '200mg'$ , calories 30% from fat and 7% or less from saturated fats, daily to have 5 or more servings of fruits and vegetables.  Biometrics: Pre Biometrics - 01/05/19 1436      Pre Biometrics   Height  '5\' 2"'$  (1.575 m)    Weight  146 lb (66.2 kg)    BMI (Calculated)  26.7    Single Leg Stand  2.65 seconds      Post Biometrics - 05/01/19 1452       Post  Biometrics   Height  '5\' 2"'$  (1.575 m)    Weight  139 lb 9.6 oz (63.3 kg)    BMI (Calculated)  25.53       Nutrition Therapy Plan and Nutrition Goals: Nutrition Therapy & Goals - 01/05/19 1328      Nutrition Therapy   Diet  Low Na, HH diet    Protein (specify units)  55g    Fiber  25 grams    Whole Grain Foods  3 servings    Saturated  Fats  12 max. grams    Fruits and Vegetables  5 servings/day    Sodium  1.5 grams      Personal Nutrition Goals   Nutrition Goal  ST: wants to think over everything LT: eat healthier overall    Comments  Pt B: black coffee, oatmeal, toast and smart balance. L: sandwich (white wheat) - tomato with mayo, salt/pepper. D: meat/chicken, vegetables, potatoes. (will switch between brown and white rice), whole wheat pasta, does  not like whole wheat bread (suggested bread brands I like to try). Pt will have 1 diet pepsi and snack on chips and regular popcorn (talked about switching to poppers instead of chips (pt asked). Pt will have small ice cream or dark chocolate kisses. Pt drinks almond milk (dairy like feta can bother her), pt will use 1% milk in cooking, and will sometimes have whole eggs as part of her meal. Pt also reports sice quarentine going to [place] 3x per week for a meal. Discussed HH eating and some meal ideas (pt doesn't know about what to do for lunch).      Intervention Plan   Intervention  Prescribe, educate and counsel regarding individualized specific dietary modifications aiming towards targeted core components such as weight, hypertension, lipid management, diabetes, heart failure and other comorbidities.;Nutrition handout(s) given to patient.    Expected Outcomes  Short Term Goal: Understand basic principles of dietary content, such as calories, fat, sodium, cholesterol and nutrients.;Short Term Goal: A plan has been developed with personal nutrition goals set during dietitian appointment.;Long Term Goal: Adherence to prescribed nutrition plan.       Nutrition Assessments: Nutrition Assessments - 01/05/19 1506      MEDFICTS Scores   Pre Score  28       Nutrition Goals Re-Evaluation: Nutrition Goals Re-Evaluation    Row Name 02/15/19 1451 03/01/19 1451 04/03/19 1442 04/24/19 1454       Goals   Current Weight  -  142 lb 11.2 oz (64.7 kg)  -  -    Nutrition Goal  ST:  limit Na intake and read labels LT: eat healthier overall  ST: limit Na intake and read labels; continue to decrease sodium intake LT: eat healthier overall  ST: increase fruit and vegetable intake LT: eat healthier overall  ST: increase fruit and vegetable intake LT: eat healthier overall    Comment  Discussed HH and low Na eating again due to recent hospitalization. Pt reports asking for things without salt at restaurants and buys lower sodium [place] deli slices. Gave pt lower sodium handout. Discussed making own cereal with cheaper whole grain cereal seeds or nuts and dried fruit because was going to buy expensive one.  Pt reports her vision is her major barrior in preparing food, pt is trying to make more food without sodium, got low sodium [place], choosing low sodium broth and is making more from scratch - this week she plans on making vegetable soup. Encouraged pt that she is doing well, pt reports trying her best for now doesn't need anything from RD.  Pt reports that she would like to increase her fruit and vegetable intake. Pt sister helps her with her prepping her food as she has difficulty seeing, we discussed some vegteables with limited to no prep such as spinach and baby carrots. Also discussed some tools to help such as a mini chopper. Pt was responsive. Gave some examples of how to incorporate more vegetables and some examples of pasta dishes per pt request.  Pt reports doing fairly well with diet, pt reports continuing with diet changes. Pt still trying to include more F/V, encouraged pt to attend F/V webex presentation 04/25/19.    Expected Outcome  ST: limit Na intake and read labels LT: eat healthier overall  ST: limit Na intake and read labels LT: eat healthier overall  ST: increase fruit and vegetable intake LT: eat healthier overall  ST: increase fruit and vegetable intake LT: eat healthier overall  Nutrition Goals Discharge (Final Nutrition Goals Re-Evaluation): Nutrition Goals  Re-Evaluation - 04/24/19 1454      Goals   Nutrition Goal  ST: increase fruit and vegetable intake LT: eat healthier overall    Comment  Pt reports doing fairly well with diet, pt reports continuing with diet changes. Pt still trying to include more F/V, encouraged pt to attend F/V webex presentation 04/25/19.    Expected Outcome  ST: increase fruit and vegetable intake LT: eat healthier overall       Psychosocial: Target Goals: Acknowledge presence or absence of significant depression and/or stress, maximize coping skills, provide positive support system. Participant is able to verbalize types and ability to use techniques and skills needed for reducing stress and depression.   Initial Review & Psychosocial Screening: Initial Psych Review & Screening - 01/02/19 1554      Initial Review   Current issues with  Current Sleep Concerns;Current Stress Concerns    Comments  Jacqlyn Larsen is having issues sleeping since her MI, she is hoping it will get better in the next week. She is handling the MI well and has a good support system, but this has definitely been a change in their everyday life.      Family Dynamics   Good Support System?  Yes      Barriers   Psychosocial barriers to participate in program  There are no identifiable barriers or psychosocial needs.      Screening Interventions   Interventions  Encouraged to exercise    Expected Outcomes  Short Term goal: Utilizing psychosocial counselor, staff and physician to assist with identification of specific Stressors or current issues interfering with healing process. Setting desired goal for each stressor or current issue identified.;Long Term Goal: Stressors or current issues are controlled or eliminated.;Short Term goal: Identification and review with participant of any Quality of Life or Depression concerns found by scoring the questionnaire.;Long Term goal: The participant improves quality of Life and PHQ9 Scores as seen by post scores  and/or verbalization of changes       Quality of Life Scores:  Quality of Life - 01/05/19 1357      Quality of Life   Select  Quality of Life      Quality of Life Scores   Health/Function Pre  18.83 %    Socioeconomic Pre  28.38 %    Psych/Spiritual Pre  30 %    Family Pre  27.6 %    GLOBAL Pre  24.5 %      Scores of 19 and below usually indicate a poorer quality of life in these areas.  A difference of  2-3 points is a clinically meaningful difference.  A difference of 2-3 points in the total score of the Quality of Life Index has been associated with significant improvement in overall quality of life, self-image, physical symptoms, and general health in studies assessing change in quality of life.  PHQ-9: Recent Review Flowsheet Data    Depression screen [facility] 2/9 01/05/2019 04/12/2018 02/23/2017 02/19/2016 08/14/2015   Decreased Interest 1 0 0 0 0   Down, Depressed, Hopeless - 0 0 0 0   PHQ - 2 Score 1 0 0 0 0   Altered sleeping 0 - - - -   Tired, decreased energy 1 - - - -   Change in appetite 1 - - - -   Feeling bad or failure about yourself  0 - - - -   Trouble concentrating 1 - - - -  Moving slowly or fidgety/restless 0 - - - -   Suicidal thoughts 0 - - - -   PHQ-9 Score 4 - - - -   Difficult doing work/chores Not difficult at all - - - -     Interpretation of Total Score  Total Score Depression Severity:  1-4 = Minimal depression, 5-9 = Mild depression, 10-14 = Moderate depression, 15-19 = Moderately severe depression, 20-27 = Severe depression   Psychosocial Evaluation and Intervention: Psychosocial Evaluation - 05/01/19 1453      Discharge Psychosocial Assessment & Intervention   Comments  Jacqlyn Larsen has enjoyed rehab.  She has really enjoyed connecting with the staff and getting back into an exercise routine.  She likes that it gets her out of the house regularly.  She is planning to come try the [facility].  She made a huge improvement on her post 6MWT!        Psychosocial Re-Evaluation: Psychosocial Re-Evaluation    [place] Name 02/13/19 6269 03/01/19 1457 03/27/19 1503         Psychosocial Re-Evaluation   Current issues with  Current Stress Concerns  Current Stress Concerns  Current Stress Concerns     Comments  Patient stated that she was overwhelmed with all the information she was given while in the hospital. She talked to her doctor about this and he reasurred her she was doing everything she was supposed to be doing and she stated she felt much better emotionally after that. She is sleeping well, but still having to use 2 pillows to help with breathing. Patient does not report any major depression or anxiety symptoms.  Jacqlyn Larsen is doing well in rehab.  Her vision continues to be her biggest stressor and source of stress.  She also gets impatient with herself from time to time. She has enjoyed coming to class and being amongst people again is nice.  She usually sleeps pretty good but does have nights where she wakes because of her leg pain but they can't get back to sleep.  Jacqlyn Larsen is doing better mentally.  She had good meeting with Dr. Burt Knack last month and is feeling better.  She is getting more patient with herself.  She continues to sleep better being more active.  She has also tried to take less naps.  She voted this past weekend and is ready for the election to be over.     Expected Outcomes  Short: continue to take all medications and attend heart track classes. Long: Continue to maintain and manage mental health and good sleep habits.  Short: Continue to attend class for outlet.  Long: Continue to try not to be so hard on herself.  Short: Continue to get out more to walk for mental health and to get out.  Long: Continue to practice self care     Interventions  Encouraged to attend Cardiac Rehabilitation for the exercise  Encouraged to attend Cardiac Rehabilitation for the exercise  Encouraged to attend Cardiac Rehabilitation for the exercise      Continue Psychosocial Services   Follow up required by staff  Follow up required by staff  Follow up required by staff        Psychosocial Discharge (Final Psychosocial Re-Evaluation): Psychosocial Re-Evaluation - 03/27/19 1503      Psychosocial Re-Evaluation   Current issues with  Current Stress Concerns    Comments  Jacqlyn Larsen is doing better mentally.  She had good meeting with Dr. Burt Knack last month and is feeling better.  She  is getting more patient with herself.  She continues to sleep better being more active.  She has also tried to take less naps.  She voted this past weekend and is ready for the election to be over.    Expected Outcomes  Short: Continue to get out more to walk for mental health and to get out.  Long: Continue to practice self care    Interventions  Encouraged to attend Cardiac Rehabilitation for the exercise    Continue Psychosocial Services   Follow up required by staff       Vocational Rehabilitation: Provide vocational rehab assistance to qualifying candidates.   Vocational Rehab Evaluation & Intervention:   Education: Education Goals: Education classes will be provided on a variety of topics geared toward better understanding of heart health and risk factor modification. Participant will state understanding/return demonstration of topics presented as noted by education test scores.  Learning Barriers/Preferences: Learning Barriers/Preferences - 01/02/19 1550      Learning Barriers/Preferences   Learning Barriers  Sight   macular degeneration   Learning Preferences  None       Education Topics:  AED/CPR: - Group verbal and written instruction with the use of models to demonstrate the basic use of the AED with the basic ABC's of resuscitation.   General Nutrition Guidelines/Fats and Fiber: -Group instruction provided by verbal, written material, models and posters to present the general guidelines for heart healthy nutrition. Gives an explanation and  review of dietary fats and fiber.   Cardiac Rehab from 05/04/2019 in [facility] Cardiac and Pulmonary Rehab  Date  05/04/19  Educator  [facility]  Instruction Review Code  1- Verbalizes Understanding      Controlling Sodium/Reading Food Labels: -Group verbal and written material supporting the discussion of sodium use in heart healthy nutrition. Review and explanation with models, verbal and written materials for utilization of the food label.   Exercise Physiology & General Exercise Guidelines: - Group verbal and written instruction with models to review the exercise physiology of the cardiovascular system and associated critical values. Provides general exercise guidelines with specific guidelines to those with heart or lung disease.    Aerobic Exercise & Resistance Training: - Gives group verbal and written instruction on the various components of exercise. Focuses on aerobic and resistive training programs and the benefits of this training and how to safely progress through these programs..   Cardiac Rehab from 05/04/2019 in [facility] Cardiac and Pulmonary Rehab  Date  04/06/19  Educator  [facility]  Instruction Review Code  1- Verbalizes Understanding      Flexibility, Balance, Mind/Body Relaxation: Provides group verbal/written instruction on the benefits of flexibility and balance training, including mind/body exercise modes such as yoga, pilates and tai chi.  Demonstration and skill practice provided.   Cardiac Rehab from 05/04/2019 in [facility] Cardiac and Pulmonary Rehab  Date  05/04/19  Educator  [facility]  Instruction Review Code  1- Verbalizes Understanding      Stress and Anxiety: - Provides group verbal and written instruction about the health risks of elevated stress and causes of high stress.  Discuss the correlation between heart/lung disease and anxiety and treatment options. Review healthy ways to manage with stress and anxiety.   Depression: - Provides group verbal and written instruction on the  correlation between heart/lung disease and depressed mood, treatment options, and the stigmas associated with seeking treatment.   Anatomy & Physiology of the Heart: - Group verbal and written instruction and models provide basic cardiac  anatomy and physiology, with the coronary electrical and arterial systems. Review of Valvular disease and Heart Failure   Cardiac Procedures: - Group verbal and written instruction to review commonly prescribed medications for heart disease. Reviews the medication, class of the drug, and side effects. Includes the steps to properly store meds and maintain the prescription regimen. (beta blockers and nitrates)   Cardiac Medications I: - Group verbal and written instruction to review commonly prescribed medications for heart disease. Reviews the medication, class of the drug, and side effects. Includes the steps to properly store meds and maintain the prescription regimen.   Cardiac Medications II: -Group verbal and written instruction to review commonly prescribed medications for heart disease. Reviews the medication, class of the drug, and side effects. (all other drug classes)   Cardiac Rehab from 05/04/2019 in [facility] Cardiac and Pulmonary Rehab  Date  04/06/19  Educator  [facility]  Instruction Review Code  1- Verbalizes Understanding       Go Sex-Intimacy & Heart Disease, Get SMART - Goal Setting: - Group verbal and written instruction through game format to discuss heart disease and the return to sexual intimacy. Provides group verbal and written material to discuss and apply goal setting through the application of the S.M.A.R.T. Method.   Other Matters of the Heart: - Provides group verbal, written materials and models to describe Stable Angina and Peripheral Artery. Includes description of the disease process and treatment options available to the cardiac patient.   Exercise & Equipment Safety: - Individual verbal instruction and demonstration of  equipment use and safety with use of the equipment.   Cardiac Rehab from 05/04/2019 in [facility] Cardiac and Pulmonary Rehab  Date  01/05/19  Educator  [place]  Instruction Review Code  1- Verbalizes Understanding      Infection Prevention: - Provides verbal and written material to individual with discussion of infection control including proper hand washing and proper equipment cleaning during exercise session.   Cardiac Rehab from 05/04/2019 in [facility] Cardiac and Pulmonary Rehab  Date  01/05/19  Educator  [place]  Instruction Review Code  1- Verbalizes Understanding      Falls Prevention: - Provides verbal and written material to individual with discussion of falls prevention and safety.   Cardiac Rehab from 05/04/2019 in [facility] Cardiac and Pulmonary Rehab  Date  01/05/19  Educator  [place]  Instruction Review Code  1- Verbalizes Understanding      Diabetes: - Individual verbal and written instruction to review signs/symptoms of diabetes, desired ranges of glucose level fasting, after meals and with exercise. Acknowledge that pre and post exercise glucose checks will be done for 3 sessions at entry of program.   Know Your Numbers and Risk Factors: -Group verbal and written instruction about important numbers in your health.  Discussion of what are risk factors and how they play a role in the disease process.  Review of Cholesterol, Blood Pressure, Diabetes, and BMI and the role they play in your overall health.   Cardiac Rehab from 05/04/2019 in [facility] Cardiac and Pulmonary Rehab  Date  04/06/19  Educator  [facility]  Instruction Review Code  1- Verbalizes Understanding      Sleep Hygiene: -Provides group verbal and written instruction about how sleep can affect your health.  Define sleep hygiene, discuss sleep cycles and impact of sleep habits. Review good sleep hygiene tips.    Other: -Provides group and verbal instruction on various topics (see comments)   Knowledge Questionnaire Score: Knowledge  Questionnaire Score -  01/05/19 1357      Knowledge Questionnaire Score   Pre Score  25/26 (Exercise question missed)  reviewed correst response with Wells Guiles today. She verbalized understanding of the response.       Core Components/Risk Factors/Patient Goals at Admission: Personal Goals and Risk Factors at Admission - 01/02/19 1549      Core Components/Risk Factors/Patient Goals on Admission   Hypertension  Yes    Intervention  Provide education on lifestyle modifcations including regular physical activity/exercise, weight management, moderate sodium restriction and increased consumption of fresh fruit, vegetables, and low fat dairy, alcohol moderation, and smoking cessation.;Monitor prescription use compliance.    Expected Outcomes  Short Term: Continued assessment and intervention until BP is < 140/37m HG in hypertensive participants. < 130/845mHG in hypertensive participants with diabetes, heart failure or chronic kidney disease.;Long Term: Maintenance of blood pressure at goal levels.    Lipids  Yes    Intervention  Provide education and support for participant on nutrition & aerobic/resistive exercise along with prescribed medications to achieve LDL '70mg'$ , HDL >'40mg'$ .    Expected Outcomes  Short Term: Participant states understanding of desired cholesterol values and is compliant with medications prescribed. Participant is following exercise prescription and nutrition guidelines.;Long Term: Cholesterol controlled with medications as prescribed, with individualized exercise RX and with personalized nutrition plan. Value goals: LDL < '70mg'$ , HDL > 40 mg.       Core Components/Risk Factors/Patient Goals Review:  Goals and Risk Factor Review    Row Name 02/13/19 1517 03/01/19 1501 03/27/19 1506         Core Components/Risk Factors/Patient Goals Review   Personal Goals Review  Hypertension;Lipids;Heart Failure  Hypertension;Lipids;Heart Failure;Weight Management/Obesity   Hypertension;Lipids;Heart Failure;Weight Management/Obesity     Review  Patient attended heart track for the first time after being hospitalized for heart failure. She is currently stable and taking all medications to manage fluid and blood pressure.  BeJacqlyn Larsenontinues to work on weight loss and is happy that it is going down.  Her blood pressures are doing well.  She denies any current heart failure symptoms.  She also watches her sodium intake.  BeJacqlyn Larsenontinues to lose weight.  She was down 5 lbs between her heart failure clinic visits.  Her clothes have gotten looser on her!!  Her blood pressures have been getting better.  She has good days and not so good days.  She denies any heart failure symptoms.     Expected Outcomes  Short: continue to take new meds prescribed to manage fluid levels and blood pressure. Weigh every day. Start attending heart track regularly now that she had been cleared to return. Long: Manage heart failure and overall cardiac health with medication management and heart healthy diet and exercise.  Short: Continue to monitor sodium  Long: Continue to manage heart failure.  Short: Continue to work on weight loss.  Long: Continue to manage heart failure.        Core Components/Risk Factors/Patient Goals at Discharge (Final Review):  Goals and Risk Factor Review - 03/27/19 1506      Core Components/Risk Factors/Patient Goals Review   Personal Goals Review  Hypertension;Lipids;Heart Failure;Weight Management/Obesity    Review  BeJacqlyn Larsenontinues to lose weight.  She was down 5 lbs between her heart failure clinic visits.  Her clothes have gotten looser on her!!  Her blood pressures have been getting better.  She has good days and not so good days.  She denies any heart failure symptoms.  Expected Outcomes  Short: Continue to work on weight loss.  Long: Continue to manage heart failure.       ITP Comments: ITP Comments    Row Name 01/02/19 1556 01/09/19 1440 01/25/19 0626 02/22/19  0635 03/22/19 1241   ITP Comments  Virtual orientation completed. Diagnosis can be found in [facility] 7/14. EP/RD orientation scheduled for 7/23 at pm  First full day of exercise!  Patient was oriented to gym and equipment including functions, settings, policies, and procedures.  Patient's individual exercise prescription and treatment plan were reviewed.  All starting workloads were established based on the results of the 6 minute walk test done at initial orientation visit.  The plan for exercise progression was also introduced and progression will be customized based on patient's performance and goals.  30 Day Review Completed today. Continue with ITP unless changed by Medical Director review.  New to program  30 Day review. Continue with ITP unless directed changes per Medical Director review.  30 day review completed. ITP sent to Dr. Emily Filbert, Medical Director of Cardiac and Pulmonary Rehab. Continue with ITP unless changes are made by physician.  Department closed starting 10/2 until further notice by infection prevention and Health at Work teams for [place].   [place] Name 04/19/19 0656 05/04/19 1437         ITP Comments  30 day review completed. Continue with ITP sent to Dr. Emily Filbert, Medical Director of Cardiac and Pulmonary Rehab for review , changes as needed and signature.  Arlissa graduated today from  rehab with 36 sessions completed.  Details of the patient's exercise prescription and what She needs to do in order to continue the prescription and progress were discussed with patient.  Patient was given a copy of prescription and goals.  Patient verbalized understanding.  Cortnee plans to continue to exercise by attending the [facility]         Comments: Discharge ITP

## 2019-05-04 NOTE — Progress Notes (Signed)
Daily Session Note  Patient Details  Name: Teresa Moyer MRN: [id number] Date of Birth: February 18, 1936 Referring Provider:     Cardiac Rehab from 01/05/2019 in [facility] Cardiac and Pulmonary Rehab  Referring Provider  Teresa Moyer      Encounter Date: 05/04/2019  Check In: Session Check In - 05/04/19 1436      Check-In   Supervising physician immediately available to respond to emergencies  See telemetry face sheet for immediately available ER MD    Location  [facility]-Cardiac & Pulmonary Rehab    Staff Present  Renita Papa, RN BSN;Jeanna Durrell BS, Exercise Physiologist;Susanne Bice, RN, BSN, CCRP    Virtual Visit  No    Medication changes reported      No    Fall or balance concerns reported     No    Warm-up and Cool-down  Performed on first and last piece of equipment    Resistance Training Performed  Yes    VAD Patient?  No    PAD/SET Patient?  No      Pain Assessment   Currently in Pain?  No/denies          Social History   Tobacco Use  Smoking Status Never Smoker  Smokeless Tobacco Never Used    Goals Met:  Independence with exercise equipment Exercise tolerated well No report of cardiac concerns or symptoms Strength training completed today  Goals Unmet:  Not Applicable  Comments:  Teresa Moyer graduated today from  rehab with 36 sessions completed.  Details of the patient's exercise prescription and what She needs to do in order to continue the prescription and progress were discussed with patient.  Patient was given a copy of prescription and goals.  Patient verbalized understanding.  Teresa Moyer plans to continue to exercise by attending the [facility].    Dr. Emily Filbert is Medical Director for [place] and [name].

## 2019-05-04 NOTE — Progress Notes (Signed)
Discharge Progress Report  Patient Details  Name: Teresa Moyer MRN: [id number] Date of Birth: 06-14-1936 Referring Provider:     Cardiac Rehab from 01/05/2019 in [facility]  Referring Provider  Teresa Moyer       Number of Visits: 79  Reason for Discharge:  Patient reached a stable level of exercise. Patient independent in their exercise. Patient has met program and personal goals.  Smoking History:  Social History   Tobacco Use  Smoking Status Never Smoker  Smokeless Tobacco Never Used    Diagnosis:  ST elevation myocardial infarction (STEMI), unspecified artery ([place])  ADL [facility]SD:   Initial Exercise Prescription: Initial Exercise Prescription - 01/05/19 1400      Date of Initial Exercise RX and Referring Provider   Date  01/05/19    Referring Provider  Teresa Moyer      Treadmill   MPH  1    Grade  0    Minutes  15    METs  0.5      Recumbant Bike   Level  1    RPM  50    Watts  25    Minutes  15    METs  1      NuStep   Level  1    SPM  50    Minutes  15    METs  1      Arm Ergometer   Level  1    Watts  10    RPM  10    Minutes  15    METs  1      Biostep-RELP   Level  1    SPM  30    Minutes  15    METs  1      Prescription Details   Frequency (times per week)  3-5    Duration  Progress to 30 minutes of continuous aerobic without signs/symptoms of physical distress      Intensity   THRR 40-80% of Max Heartrate  103-127    Ratings of Perceived Exertion  11-13    Perceived Dyspnea  0-4      Progression   Progression  Continue progressive overload as per policy without signs/symptoms or physical distress.      Resistance Training   Training Prescription  Yes    Weight  2    Reps  10-15       Discharge Exercise Prescription (Final Exercise Prescription Changes): Exercise Prescription Changes - 04/04/19 1100      Response to Exercise   Blood Pressure (Admit)  142/82    Blood Pressure (Exercise)  168/82     Blood Pressure (Exit)  150/80    Heart Rate (Admit)  80 bpm    Heart Rate (Exercise)  112 bpm    Heart Rate (Exit)  72 bpm    Rating of Perceived Exertion (Exercise)  14    Symptoms  none    Duration  Continue with 30 min of aerobic exercise without signs/symptoms of physical distress.    Intensity  THRR unchanged      Progression   Progression  Continue to progress workloads to maintain intensity without signs/symptoms of physical distress.    Average METs  2.25      Resistance Training   Training Prescription  Yes    Weight  3 lbs    Reps  10-15      Interval Training   Interval Training  No  Treadmill   MPH  1.6    Grade  1    Minutes  15    METs  2.45      NuStep   Level  4    Minutes  15    METs  2      Home Exercise Plan   Plans to continue exercise at  Home (comment)   walking, videos   Frequency  Add 2 additional days to program exercise sessions.    Initial Home Exercises Provided  02/27/19       Functional Capacity: 6 Minute Walk    Row Name 01/05/19 1432 05/01/19 1449       6 Minute Walk   Phase  Initial  Discharge    Distance  263 feet  715 feet    Distance % Change  -  171.9 %    Distance Feet Change  -  452 ft    Walk Time  2 minutes  5.25 minutes    # of Rest Breaks  -  2 30 sec, 15 sec    MPH  1.49  1.55    METS  0.31  1.35    RPE  13  17    Perceived Dyspnea   3  3    VO2 Peak  1.1  5.05    Symptoms  Yes (comment)  No    Comments  Patient stopped due to shortness of breath which she reports is increased when wearing a mask. SpO2 90% increased to 96%. Patient did not wish to resume test until after time had expired.  SOB    Resting HR  79 bpm  84 bpm    Resting BP  132/80  124/72    Exercise Oxygen Saturation  during 6 min walk  90 %  -    Max Ex. HR  84 bpm  105 bpm    Max Ex. BP  146/80  146/74    2 Minute Post BP  122/68  -       Psychological, QOL, Others - Outcomes: PHQ 2/9: Depression screen [facility] 2/9 01/05/2019 04/12/2018  02/23/2017 02/19/2016 08/14/2015  Decreased Interest 1 0 0 0 0  Down, Depressed, Hopeless - 0 0 0 0  PHQ - 2 Score 1 0 0 0 0  Altered sleeping 0 - - - -  Tired, decreased energy 1 - - - -  Change in appetite 1 - - - -  Feeling bad or failure about yourself  0 - - - -  Trouble concentrating 1 - - - -  Moving slowly or fidgety/restless 0 - - - -  Suicidal thoughts 0 - - - -  PHQ-9 Score 4 - - - -  Difficult doing work/chores Not difficult at all - - - -    Quality of Life: Quality of Life - 01/05/19 1357      Quality of Life   Select  Quality of Life      Quality of Life Scores   Health/Function Pre  18.83 %    Socioeconomic Pre  28.38 %    Psych/Spiritual Pre  30 %    Family Pre  27.6 %    GLOBAL Pre  24.5 %       Personal Goals: Goals established at orientation with interventions provided to work toward goal. Personal Goals and Risk Factors at Admission - 01/02/19 1549      Core Components/Risk Factors/Patient Goals on Admission   Hypertension  Yes  Intervention  Provide education on lifestyle modifcations including regular physical activity/exercise, weight management, moderate sodium restriction and increased consumption of fresh fruit, vegetables, and low fat dairy, alcohol moderation, and smoking cessation.;Monitor prescription use compliance.    Expected Outcomes  Short Term: Continued assessment and intervention until BP is < 140/66m HG in hypertensive participants. < 130/824mHG in hypertensive participants with diabetes, heart failure or chronic kidney disease.;Long Term: Maintenance of blood pressure at goal levels.    Lipids  Yes    Intervention  Provide education and support for participant on nutrition & aerobic/resistive exercise along with prescribed medications to achieve LDL <7026mHDL >57m59m  Expected Outcomes  Short Term: Participant states understanding of desired cholesterol values and is compliant with medications prescribed. Participant is following  exercise prescription and nutrition guidelines.;Long Term: Cholesterol controlled with medications as prescribed, with individualized exercise RX and with personalized nutrition plan. Value goals: LDL < 70mg25mL > 40 mg.        Personal Goals Discharge: Goals and Risk Factor Review    Row Name 02/13/19 1517 03/01/19 1501 03/27/19 1506         Core Components/Risk Factors/Patient Goals Review   Personal Goals Review  Hypertension;Lipids;Heart Failure  Hypertension;Lipids;Heart Failure;Weight Management/Obesity  Hypertension;Lipids;Heart Failure;Weight Management/Obesity     Review  Patient attended heart track for the first time after being hospitalized for heart failure. She is currently stable and taking all medications to manage fluid and blood pressure.  BeckyJacqlyn Larseninues to work on weight loss and is happy that it is going down.  Her blood pressures are doing well.  She denies any current heart failure symptoms.  She also watches her sodium intake.  BeckyJacqlyn Larseninues to lose weight.  She was down 5 lbs between her heart failure clinic visits.  Her clothes have gotten looser on her!!  Her blood pressures have been getting better.  She has good days and not so good days.  She denies any heart failure symptoms.     Expected Outcomes  Short: continue to take new meds prescribed to manage fluid levels and blood pressure. Weigh every day. Start attending heart track regularly now that she had been cleared to return. Long: Manage heart failure and overall cardiac health with medication management and heart healthy diet and exercise.  Short: Continue to monitor sodium  Long: Continue to manage heart failure.  Short: Continue to work on weight loss.  Long: Continue to manage heart failure.        Exercise Goals and Review: Exercise Goals    Row Name 01/05/19 1441             Exercise Goals   Increase Physical Activity  Yes       Intervention  Provide advice, education, support and counseling about  physical activity/exercise needs.;Develop an individualized exercise prescription for aerobic and resistive training based on initial evaluation findings, risk stratification, comorbidities and participant's personal goals.       Expected Outcomes  Short Term: Attend rehab on a regular basis to increase amount of physical activity.;Long Term: Add in home exercise to make exercise part of routine and to increase amount of physical activity.;Long Term: Exercising regularly at least 3-5 days a week.       Increase Strength and Stamina  Yes       Intervention  Provide advice, education, support and counseling about physical activity/exercise needs.;Develop an individualized exercise prescription for aerobic and resistive training based on initial evaluation findings,  risk stratification, comorbidities and participant's personal goals.       Expected Outcomes  Short Term: Increase workloads from initial exercise prescription for resistance, speed, and METs.;Short Term: Perform resistance training exercises routinely during rehab and add in resistance training at home;Long Term: Improve cardiorespiratory fitness, muscular endurance and strength as measured by increased METs and functional capacity (6MWT)       Able to understand and use rate of perceived exertion (RPE) scale  Yes       Intervention  Provide education and explanation on how to use RPE scale       Expected Outcomes  Short Term: Able to use RPE daily in rehab to express subjective intensity level;Long Term:  Able to use RPE to guide intensity level when exercising independently       Intervention  Provide education and explanation on how to use Dyspnea scale       Expected Outcomes  Short Term: Able to use Dyspnea scale daily in rehab to express subjective sense of shortness of breath during exertion;Long Term: Able to use Dyspnea scale to guide intensity level when exercising independently       Intervention  Provide education, explanation, and  written materials on patient's individual exercise prescription          Exercise Goals Re-Evaluation: Exercise Goals Re-Evaluation    Row Name 01/09/19 1440 01/16/19 1149 02/02/19 1501 02/13/19 1522 02/21/19 1431     Exercise Goal Re-Evaluation   Exercise Goals Review  Able to understand and use rate of perceived exertion (RPE) scale;Increase Physical Activity;Knowledge and understanding of Target Heart Rate Range (THRR);Understanding of Exercise Prescription;Increase Strength and Stamina;Able to check pulse independently  Increase Physical Activity;Increase Strength and Stamina;Understanding of Exercise Prescription  Increase Physical Activity;Increase Strength and Stamina;Understanding of Exercise Prescription  Increase Physical Activity;Increase Strength and Stamina;Understanding of Exercise Prescription  Increase Physical Activity;Increase Strength and Stamina;Understanding of Exercise Prescription   Comments  Reviewed RPE scale, THR and program prescription with pt today.  Pt voiced understanding and was given a copy of goals to take home.  Teresa Moyer is off to a good start in rehab.  She has completed two full days of exercise.  We will continue to monitor her progress.  Teresa Moyer has been doing well in rehab.  She was sent to ED and admitted on Monday after exercise as she was very SOB and sats kept dropping.  She will need clearance to return to rehab.  Today was [name]'s first day back to heart track after being hospitalized for heart failure. She tolerated exercise well today and her blood pressure, heart rate, and oxygen levels were all within acceptable ranges. [name]'s SOB was much better today.  She started out easy and plans to come consistantly and build back strength.  Teresa Moyer is doing well in rehab.  She was able to return and pick back up where she was previously.  She is up to 2 METs on the arm crank and BioStep.  We will move her up at the next visit and continue to monitor her progress.    Expected Outcomes  Short: Use RPE daily to regulate intensity. Long: Follow program prescription in THR.  Short: Continue to attend regularly Long: Continue to follow program prescription.  Short: Cleared to return to rehab.  Long: Continue to increase stamina.  Short: Start attending heart track regularly and weigh every day to help manage heart failure. Long: as tolerated, incrementally increase workloads to work back to previous levels.  Short:  Increase BioStep.  Long: Continue to increase stamina.   [place] Name 02/27/19 1506 03/01/19 1453 03/09/19 1408 03/27/19 1502 04/04/19 1105     Exercise Goal Re-Evaluation   Exercise Goals Review  Increase Physical Activity;Increase Strength and Stamina;Understanding of Exercise Prescription;Able to understand and use rate of perceived exertion (RPE) scale;Knowledge and understanding of Target Heart Rate Range (THRR);Able to check pulse independently  Increase Physical Activity;Increase Strength and Stamina;Understanding of Exercise Prescription  Increase Physical Activity;Increase Strength and Stamina;Able to understand and use rate of perceived exertion (RPE) scale;Knowledge and understanding of Target Heart Rate Range (THRR);Able to check pulse independently;Understanding of Exercise Prescription  Increase Strength and Stamina;Increase Physical Activity;Understanding of Exercise Prescription  Increase Strength and Stamina;Increase Physical Activity;Understanding of Exercise Prescription   Comments  Reviewed home exercise with pt today.  Pt plans to walk and use videos at home for exercise.  Reviewed THR, pulse, RPE, sign and symptoms, NTG use, and when to call 911 or MD.  Also discussed weather considerations and indoor options.  Pt voiced understanding.  Teresa Moyer is doing well in rehab.  She has tried to get to the videos, but they could only find one.  She is going to try again to see if they can find all of them.  She is feeling stronger and to the point that she asked  to use the treadmill today!!  Teresa Moyer has been walking on TM more consistently.  She has also increased level on NS.  Staff will monitor progress.  Teresa Moyer is doing well in rehab.  She is has been doing her videos and some walking at home.  The walking usually comes from her shopping as its more motivating and flatter.  She does feel a lot stronger overall.  Teresa Moyer continues to do well in rehab.  She is enjoying the treadmill and her incline.  She continues to hold steady.  We will continue to monitor her progress.   Expected Outcomes  Short: Start to add in exercise at home.  Long: Continue to increase stamina.  Short: Continue to try to access the YouTube videos.  Long: Continue to increase stamina.  Short - continue to walk consistently Long - build stamina  Short: Continue to walk more at home.  Long: Continue to build her stamina.  Short: Walk more at home.  Long: Continue to build stamina.      Nutrition & Weight - Outcomes: Pre Biometrics - 01/05/19 1436      Pre Biometrics   Height  5' 2" (1.575 m)    Weight  146 lb (66.2 kg)    BMI (Calculated)  26.7    Single Leg Stand  2.65 seconds      Post Biometrics - 05/01/19 1452       Post  Biometrics   Height  5' 2" (1.575 m)    Weight  139 lb 9.6 oz (63.3 kg)    BMI (Calculated)  25.53       Nutrition: Nutrition Therapy & Goals - 01/05/19 1328      Nutrition Therapy   Diet  Low Na, HH diet    Protein (specify units)  55g    Fiber  25 grams    Whole Grain Foods  3 servings    Saturated Fats  12 max. grams    Fruits and Vegetables  5 servings/day    Sodium  1.5 grams      Personal Nutrition Goals   Nutrition Goal  ST: wants to think over everything  LT: eat healthier overall    Comments  Pt B: black coffee, oatmeal, toast and smart balance. L: sandwich (white wheat) - tomato with mayo, salt/pepper. D: meat/chicken, vegetables, potatoes. (will switch between brown and white rice), whole wheat pasta, does not like whole wheat bread  (suggested bread brands I like to try). Pt will have 1 diet pepsi and snack on chips and regular popcorn (talked about switching to poppers instead of chips (pt asked). Pt will have small ice cream or dark chocolate kisses. Pt drinks almond milk (dairy like feta can bother her), pt will use 1% milk in cooking, and will sometimes have whole eggs as part of her meal. Pt also reports sice quarentine going to [employer] 3x per week for a meal. Discussed HH eating and some meal ideas (pt doesn't know about what to do for lunch).      Intervention Plan   Intervention  Prescribe, educate and counsel regarding individualized specific dietary modifications aiming towards targeted core components such as weight, hypertension, lipid management, diabetes, heart failure and other comorbidities.;Nutrition handout(s) given to patient.    Expected Outcomes  Short Term Goal: Understand basic principles of dietary content, such as calories, fat, sodium, cholesterol and nutrients.;Short Term Goal: A plan has been developed with personal nutrition goals set during dietitian appointment.;Long Term Goal: Adherence to prescribed nutrition plan.       Nutrition Discharge: Nutrition Assessments - 01/05/19 1506      MEDFICTS Scores   Pre Score  28       Education Questionnaire Score: Knowledge Questionnaire Score - 01/05/19 1357      Knowledge Questionnaire Score   Pre Score  25/26 (Exercise question missed)  reviewed correst response with Wells Guiles today. She verbalized understanding of the response.       Goals reviewed with patient; copy given to patient.

## 2019-05-05 DIAGNOSIS — I10 Essential (primary) hypertension: Secondary | ICD-10-CM | POA: Diagnosis not present

## 2019-05-05 DIAGNOSIS — E782 Mixed hyperlipidemia: Secondary | ICD-10-CM | POA: Diagnosis not present

## 2019-05-05 DIAGNOSIS — I2510 Atherosclerotic heart disease of native coronary artery without angina pectoris: Secondary | ICD-10-CM | POA: Diagnosis not present

## 2019-05-05 DIAGNOSIS — I350 Nonrheumatic aortic (valve) stenosis: Secondary | ICD-10-CM | POA: Diagnosis not present

## 2019-05-09 ENCOUNTER — Other Ambulatory Visit: Payer: Self-pay | Admitting: Family Medicine

## 2019-05-09 DIAGNOSIS — Z1231 Encounter for screening mammogram for malignant neoplasm of breast: Secondary | ICD-10-CM

## 2019-05-25 DIAGNOSIS — H353231 Exudative age-related macular degeneration, bilateral, with active choroidal neovascularization: Secondary | ICD-10-CM | POA: Diagnosis not present

## 2019-05-31 ENCOUNTER — Ambulatory Visit
Admission: RE | Admit: 2019-05-31 | Discharge: 2019-05-31 | Disposition: A | Discharge status: Home / Self Care | Payer: Medicare Other | Source: Ambulatory Visit | Attending: Family Medicine | Admitting: Family Medicine

## 2019-05-31 DIAGNOSIS — Z1231 Encounter for screening mammogram for malignant neoplasm of breast: Secondary | ICD-10-CM | POA: Insufficient documentation

## 2019-06-15 ENCOUNTER — Ambulatory Visit (INDEPENDENT_AMBULATORY_CARE_PROVIDER_SITE_OTHER): Payer: Medicare Other | Admitting: Family Medicine

## 2019-06-15 DIAGNOSIS — S8392XA Sprain of unspecified site of left knee, initial encounter: Secondary | ICD-10-CM

## 2019-06-15 MED ORDER — PREDNISONE 5 MG PO TABS: 5.0000 mg | ORAL_TABLET | Freq: Every day | ORAL | 0 refills | Status: DC

## 2019-06-15 MED ORDER — TRAMADOL HCL 50 MG PO TABS: 50.0000 mg | ORAL_TABLET | Freq: Four times a day (QID) | ORAL | 1 refills | Status: DC | PRN

## 2019-06-15 NOTE — Progress Notes (Signed)
Patient: Teresa Moyer Female    DOB: 1935/08/02   83 y.o.   MRN: [id number] Visit Date: 06/15/2019  Today's Provider: [name], MD   Chief Complaint  Patient presents with  . Knee Pain   Subjective:    Virtual Visit via Telephone Note  I connected with [name] on 06/15/19 at  9:20 AM EST by telephone and verified that I am speaking with the correct person using two identifiers.  Location: Patient: Home Provider: Office   I discussed the limitations, risks, security and privacy concerns of performing an evaluation and management service by telephone and the availability of in person appointments. I also discussed with the patient that there may be a patient responsible charge related to this service. The patient expressed understanding and agreed to proceed.   Knee Pain  The incident occurred 12 to 24 hours ago. The injury mechanism was a twisting injury. The pain is present in the left knee. The quality of the pain is described as aching. The pain is at a severity of 6/10. The pain has been constant since onset. Associated symptoms include an inability to bear weight. Pertinent negatives include no loss of motion, numbness or tingling. The symptoms are aggravated by movement and weight bearing. She has tried ice, acetaminophen, rest and elevation for the symptoms. The treatment provided mild relief.  Patient has been doing well when she was just walking routinely yesterday when her knee twisted and seem to give way.  She had immediate pain and since then it has been painful to bear weight and she has had swelling in the left knee.  She has had no fall.  She states other than the swelling the knee appears to be normal as does the lower leg.  Allergies  Allergen Reactions  . Accupril [Quinapril Hcl] Hives and Swelling    SWELLING OF NOSE/FACE  . Sulfa Antibiotics Shortness Of Breath and Swelling  . Clinoril [Sulindac] Other (See Comments)    Unsure of  reaction type  . Hydrochlorothiazide Other (See Comments)    Unsure of reaction type  . Tessalon [Benzonatate] Other (See Comments)    Unsure of reaction type  . Amoxicillin Rash    Has patient had a PCN reaction causing immediate rash, facial/tongue/throat swelling, SOB or lightheadedness with hypotension: Unknown Has patient had a PCN reaction causing severe rash involving mucus membranes or skin necrosis: Unknown Has patient had a PCN reaction that required hospitalization: No Has patient had a PCN reaction occurring within the last 10 years:Possibly unsure  If all of the above answers are "NO", then may proceed with Cephalosporin use.   . Codeine Sulfate Nausea And Vomiting  . Penicillins Rash    Has patient had a PCN reaction causing immediate rash, facial/tongue/throat swelling, SOB or lightheadedness with hypotension: Unknown Has patient had a PCN reaction causing severe rash involving mucus membranes or skin necrosis: Unknown Has patient had a PCN reaction that required hospitalization: No Has patient had a PCN reaction occurring within the last 10 years:Possibly unsure  If all of the above answers are "NO", then may proceed with Cephalosporin use.     Current Outpatient Medications:  .  acetaminophen (TYLENOL) 650 MG CR tablet, Take 650 mg by mouth every 8 (eight) hours as needed for pain., Disp: , Rfl:  .  aspirin 81 MG chewable tablet, Chew 1 tablet (81 mg total) by mouth daily., Disp: 30 tablet, Rfl: 0 .  atorvastatin (  LIPITOR) 40 MG tablet, Take 1 tablet (40 mg total) by mouth daily at 6 PM., Disp: 30 tablet, Rfl: 0 .  azelastine (ASTELIN) 0.1 % nasal spray, Place 1 spray into both nostrils at bedtime. , Disp: , Rfl:  .  Carboxymethylcell-Hypromellose (GENTEAL) 0.25-0.3 % GEL, Apply 1 application to eye at bedtime., Disp: , Rfl:  .  carvedilol (COREG) 6.25 MG tablet, Take 1 tablet (6.25 mg total) by mouth 2 (two) times daily with a meal., Disp: 180 tablet, Rfl: 3 .   clopidogrel (PLAVIX) 75 MG tablet, Take 1 tablet by mouth daily., Disp: , Rfl:  .  fluticasone (FLONASE) 50 MCG/ACT nasal spray, Place 1 spray into both nostrils daily. , Disp: , Rfl:  .  furosemide (LASIX) 20 MG tablet, Take 1 tablet (20 mg total) by mouth daily. Take 40 mg daily for 3 days,till Friday,after that resume 20 mg daily, Disp: 30 tablet, Rfl: 1 .  irbesartan (AVAPRO) 300 MG tablet, Take 0.5 tablets (150 mg total) by mouth daily., Disp: 30 tablet, Rfl: 0 .  omeprazole (PRILOSEC) 20 MG capsule, Take 1 capsule (20 mg total) by mouth daily., Disp: 90 capsule, Rfl: 3 .  Polyethyl Glycol-Propyl Glycol (SYSTANE) 0.4-0.3 % SOLN, Place 1 drop into both eyes 3 (three) times daily as needed (for dry eyes)., Disp: , Rfl:  .  potassium chloride (KLOR-CON 10) 10 MEQ tablet, Take 1 tablet (10 mEq total) by mouth daily., Disp: 30 tablet, Rfl: 0 .  Probiotic Product (ALIGN) 4 MG CAPS, Take 4 mg by mouth daily after lunch. , Disp: , Rfl:  .  cetirizine (ZYRTEC ALLERGY) 10 MG tablet, Take 10 mg by mouth at bedtime. , Disp: , Rfl:  .  Multiple Vitamins-Minerals (PRESERVISION AREDS PO), Take 1 tablet by mouth 2 (two) times daily. , Disp: , Rfl:  .  Nutritional Supplements (JOINT FORMULA PO), Take 1 capsule by mouth daily before breakfast. Arthrozene, Disp: , Rfl:  .  spironolactone (ALDACTONE) 25 MG tablet, Take 25 mg by mouth daily., Disp: , Rfl:   Review of Systems  Neurological: Negative for tingling and numbness.    Social History   Tobacco Use  . Smoking status: Never Smoker  . Smokeless tobacco: Never Used  Substance Use Topics  . Alcohol use: No      Objective:   There were no vitals taken for this visit. There were no vitals filed for this visit.There is no height or weight on file to calculate BMI.   Physical Exam   No results found for any visits on 06/15/19.     Assessment & Plan     1. Sprain of left knee, unspecified ligament, initial encounter Patient declines to be  evaluated in person today.  Treat with a short prednisone burst and some tramadol.  If she is not improving by next week will refer to Ortho.  Also could see her in the office.  I discussed the assessment and treatment plan with the patient. The patient was provided an opportunity to ask questions and all were answered. The patient agreed with the plan and demonstrated an understanding of the instructions.   The patient was advised to call back or seek an in-person evaluation if the symptoms worsen or if the condition fails to improve as anticipated.  I provided 10 minutes of non-face-to-face time during this encounter.    Richard Cranford Mon, MD  [facility]

## 2019-06-22 DIAGNOSIS — H353231 Exudative age-related macular degeneration, bilateral, with active choroidal neovascularization: Secondary | ICD-10-CM | POA: Diagnosis not present

## 2019-06-26 ENCOUNTER — Telehealth: Payer: Self-pay | Admitting: Family Medicine

## 2019-06-26 NOTE — Telephone Encounter (Signed)
Patient would like to know if it is safe for her to have covid-19 vaccine with the medications she is on/health issues.

## 2019-06-27 NOTE — Telephone Encounter (Signed)
yes

## 2019-06-27 NOTE — Telephone Encounter (Signed)
Patient was advised.  

## 2019-07-12 NOTE — Progress Notes (Signed)
Patient: Teresa Moyer Female    DOB: Nov 02, 1935   84 y.o.   MRN: [id number] Visit Date: 07/13/2019  Today's Provider: [name], MD   Chief Complaint  Patient presents with  . Follow-up  . Knee Pain   Subjective:     HPI  Pain in left knee with any weightbearing.  She thinks she twisted her left knee which is causing the pain.  No falls. Sprain of left knee, unspecified ligament, initial encounter From 06/15/2019-Patient declines to be evaluated in person today. Treat with a short prednisone burst and tramadol.  If she is not improving by next week will refer to Ortho.    Patient is still having pain in her left knee and leg.    Allergies  Allergen Reactions  . Accupril [Quinapril Hcl] Hives and Swelling    SWELLING OF NOSE/FACE  . Sulfa Antibiotics Shortness Of Breath and Swelling  . Clinoril [Sulindac] Other (See Comments)    Unsure of reaction type  . Hydrochlorothiazide Other (See Comments)    Unsure of reaction type  . Tessalon [Benzonatate] Other (See Comments)    Unsure of reaction type  . Amoxicillin Rash    Has patient had a PCN reaction causing immediate rash, facial/tongue/throat swelling, SOB or lightheadedness with hypotension: Unknown Has patient had a PCN reaction causing severe rash involving mucus membranes or skin necrosis: Unknown Has patient had a PCN reaction that required hospitalization: No Has patient had a PCN reaction occurring within the last 10 years:Possibly unsure  If all of the above answers are "NO", then may proceed with Cephalosporin use.   . Codeine Sulfate Nausea And Vomiting  . Penicillins Rash    Has patient had a PCN reaction causing immediate rash, facial/tongue/throat swelling, SOB or lightheadedness with hypotension: Unknown Has patient had a PCN reaction causing severe rash involving mucus membranes or skin necrosis: Unknown Has patient had a PCN reaction that required hospitalization: No Has patient had a  PCN reaction occurring within the last 10 years:Possibly unsure  If all of the above answers are "NO", then may proceed with Cephalosporin use.     Current Outpatient Medications:  .  acetaminophen (TYLENOL) 650 MG CR tablet, Take 650 mg by mouth every 8 (eight) hours as needed for pain., Disp: , Rfl:  .  aspirin 81 MG chewable tablet, Chew 1 tablet (81 mg total) by mouth daily., Disp: 30 tablet, Rfl: 0 .  atorvastatin (LIPITOR) 40 MG tablet, Take 1 tablet (40 mg total) by mouth daily at 6 PM., Disp: 30 tablet, Rfl: 0 .  azelastine (ASTELIN) 0.1 % nasal spray, Place 1 spray into both nostrils at bedtime. , Disp: , Rfl:  .  Carboxymethylcell-Hypromellose (GENTEAL) 0.25-0.3 % GEL, Apply 1 application to eye at bedtime., Disp: , Rfl:  .  carvedilol (COREG) 6.25 MG tablet, Take 1 tablet (6.25 mg total) by mouth 2 (two) times daily with a meal., Disp: 180 tablet, Rfl: 3 .  cetirizine (ZYRTEC ALLERGY) 10 MG tablet, Take 10 mg by mouth at bedtime. , Disp: , Rfl:  .  clopidogrel (PLAVIX) 75 MG tablet, Take 1 tablet by mouth daily., Disp: , Rfl:  .  fluticasone (FLONASE) 50 MCG/ACT nasal spray, Place 1 spray into both nostrils daily. , Disp: , Rfl:  .  furosemide (LASIX) 20 MG tablet, Take 1 tablet (20 mg total) by mouth daily. Take 40 mg daily for 3 days,till Friday,after that resume 20 mg daily, Disp: 30  tablet, Rfl: 1 .  irbesartan (AVAPRO) 300 MG tablet, Take 0.5 tablets (150 mg total) by mouth daily., Disp: 30 tablet, Rfl: 0 .  Multiple Vitamins-Minerals (PRESERVISION AREDS PO), Take 1 tablet by mouth 2 (two) times daily. , Disp: , Rfl:  .  Nutritional Supplements (JOINT FORMULA PO), Take 1 capsule by mouth daily before breakfast. Arthrozene, Disp: , Rfl:  .  omeprazole (PRILOSEC) 20 MG capsule, Take 1 capsule (20 mg total) by mouth daily., Disp: 90 capsule, Rfl: 3 .  Polyethyl Glycol-Propyl Glycol (SYSTANE) 0.4-0.3 % SOLN, Place 1 drop into both eyes 3 (three) times daily as needed (for dry eyes).,  Disp: , Rfl:  .  potassium chloride (KLOR-CON 10) 10 MEQ tablet, Take 1 tablet (10 mEq total) by mouth daily., Disp: 30 tablet, Rfl: 0 .  Probiotic Product (ALIGN) 4 MG CAPS, Take 4 mg by mouth daily after lunch. , Disp: , Rfl:  .  traMADol (ULTRAM) 50 MG tablet, Take 1 tablet (50 mg total) by mouth every 6 (six) hours as needed., Disp: 40 tablet, Rfl: 1 .  predniSONE (DELTASONE) 5 MG tablet, Take 1 tablet (5 mg total) by mouth daily with breakfast. Taper 6-5-4-3-2-1 (Patient not taking: Reported on 07/13/2019), Disp: 21 tablet, Rfl: 0 .  spironolactone (ALDACTONE) 25 MG tablet, Take 25 mg by mouth daily., Disp: , Rfl:   Review of Systems  Constitutional: Negative for appetite change, chills, fatigue and fever.  Eyes: Negative.   Respiratory: Negative for chest tightness and shortness of breath.   Cardiovascular: Negative for chest pain and palpitations.  Gastrointestinal: Negative for abdominal pain, nausea and vomiting.  Endocrine: Negative.   Allergic/Immunologic: Negative.   Neurological: Negative for dizziness and weakness.  Hematological: Negative.   Psychiatric/Behavioral: Negative.     Social History   Tobacco Use  . Smoking status: Never Smoker  . Smokeless tobacco: Never Used  Substance Use Topics  . Alcohol use: No      Objective:   There were no vitals taken for this visit. There were no vitals filed for this visit.There is no height or weight on file to calculate BMI.   Physical Exam Vitals reviewed.  Constitutional:      Appearance: She is well-developed.  HENT:     Head: Normocephalic and atraumatic.     Right Ear: External ear normal.     Left Ear: External ear normal.     Nose: Nose normal.  Eyes:     Conjunctiva/sclera: Conjunctivae normal.     Pupils: Pupils are equal, round, and reactive to light.  Cardiovascular:     Rate and Rhythm: Normal rate and regular rhythm.     Heart sounds: Murmur (2/6 systolic murmur) present.     Comments: 2/6 early  systolic murmur at RUSB. Pulmonary:     Effort: Pulmonary effort is normal.     Breath sounds: Normal breath sounds.  Abdominal:     General: There is no distension.     Palpations: Abdomen is soft.     Tenderness: There is no abdominal tenderness.  Musculoskeletal:        General: Normal range of motion.     Cervical back: Normal range of motion and neck supple.     Comments: The knee is inherently stable but she does have tenderness along the joint line, more so on the medial joint line.  Skin:    General: Skin is warm and dry.  Neurological:     General: No focal deficit present.  Mental Status: She is alert and oriented to person, place, and time.     Deep Tendon Reflexes: Reflexes are normal and symmetric.  Psychiatric:        Mood and Affect: Mood normal.        Behavior: Behavior normal.        Thought Content: Thought content normal.        Judgment: Judgment normal.      No results found for any visits on 07/13/19.     Assessment & Plan    1. Sprain of left knee, unspecified ligament, initial encounter I think this is more likely osteoarthritic changes.  She could have a meniscal tear.  Prednisone helped her for a short while a month ago.  Tramadol helps a little.  Refer to orthopedics. - AMB referral to orthopedics - DG Knee Complete 4 Views Left  2. H/o STEMI involving left anterior descending coronary artery ([place]) All risk factors treated.  3. Essential (primary) hypertension Controlled on Avapro and Coreg  4. Mixed hyperlipidemia On atorvastatin    I,Rosaleen Mazer,acting as a scribe for [name], MD.,have documented all relevant documentation on the behalf of [name], MD,as directed by  [name], MD while in the presence of [name], MD.      [name], MD  [facility]

## 2019-07-13 ENCOUNTER — Ambulatory Visit
Admission: RE | Admit: 2019-07-13 | Discharge: 2019-07-13 | Disposition: A | Discharge status: Home / Self Care | Payer: Medicare Other | Source: Ambulatory Visit | Attending: Family Medicine | Admitting: Family Medicine

## 2019-07-13 ENCOUNTER — Other Ambulatory Visit: Payer: Self-pay

## 2019-07-13 ENCOUNTER — Encounter: Payer: Self-pay | Admitting: Family Medicine

## 2019-07-13 ENCOUNTER — Ambulatory Visit (INDEPENDENT_AMBULATORY_CARE_PROVIDER_SITE_OTHER): Payer: Medicare Other | Admitting: Family Medicine

## 2019-07-13 VITALS — BP 155/81 | HR 75 | Temp 97.1°F | Resp 18 | Ht 62.0 in | Wt 137.0 lb

## 2019-07-13 DIAGNOSIS — S8392XA Sprain of unspecified site of left knee, initial encounter: Secondary | ICD-10-CM | POA: Insufficient documentation

## 2019-07-13 DIAGNOSIS — I2102 ST elevation (STEMI) myocardial infarction involving left anterior descending coronary artery: Secondary | ICD-10-CM

## 2019-07-13 DIAGNOSIS — E782 Mixed hyperlipidemia: Secondary | ICD-10-CM

## 2019-07-13 DIAGNOSIS — I10 Essential (primary) hypertension: Secondary | ICD-10-CM | POA: Diagnosis not present

## 2019-07-13 DIAGNOSIS — M1712 Unilateral primary osteoarthritis, left knee: Secondary | ICD-10-CM | POA: Diagnosis not present

## 2019-07-15 ENCOUNTER — Other Ambulatory Visit: Payer: Self-pay | Admitting: Family Medicine

## 2019-07-15 NOTE — Telephone Encounter (Signed)
Requested medication (s) are due for refill today: yes  Requested medication (s) are on the active medication list: yes  Last refill:  05/2019 #40 with 1 refill  Future visit scheduled: yes  Notes to clinic: Refill not delegated per protocol    Requested Prescriptions  Pending Prescriptions Disp Refills   traMADol (ULTRAM) 50 MG tablet [Pharmacy Med Name: TRAMADOL HCL 50 MG TAB] 40 tablet     Sig: TAKE 1 TABLET BY MOUTH EVERY 6 HOURS AS NEEDED      Not Delegated - Analgesics:  Opioid Agonists Failed - 07/15/2019 11:13 AM      Failed - This refill cannot be delegated      Failed - Urine Drug Screen completed in last 360 days.      Passed - Valid encounter within last 6 months    Recent Outpatient Visits           2 days ago Sprain of left knee, unspecified ligament, initial encounter   [facility] Jerrol Banana., MD   1 month ago Sprain of left knee, unspecified ligament, initial encounter   [facility] Jerrol Banana., MD   3 months ago Need for influenza vaccination   [facility] Jerrol Banana., MD   5 months ago STEMI involving left anterior descending coronary artery [facility])   [facility] Jerrol Banana., MD   6 months ago STEMI involving left anterior descending coronary artery [facility])   [facility] Jerrol Banana., MD       Future Appointments             In 2 months Jerrol Banana., MD [facility] [place]

## 2019-07-17 ENCOUNTER — Telehealth: Payer: Self-pay

## 2019-07-17 NOTE — Telephone Encounter (Signed)
Patient advised.

## 2019-07-20 DIAGNOSIS — H353231 Exudative age-related macular degeneration, bilateral, with active choroidal neovascularization: Secondary | ICD-10-CM | POA: Diagnosis not present

## 2019-07-24 DIAGNOSIS — M25562 Pain in left knee: Secondary | ICD-10-CM | POA: Diagnosis not present

## 2019-07-24 DIAGNOSIS — M1712 Unilateral primary osteoarthritis, left knee: Secondary | ICD-10-CM | POA: Diagnosis not present

## 2019-07-24 DIAGNOSIS — G8929 Other chronic pain: Secondary | ICD-10-CM | POA: Diagnosis not present

## 2019-08-02 ENCOUNTER — Telehealth: Payer: Self-pay

## 2019-08-02 NOTE — Telephone Encounter (Signed)
LMTCB to inquire about telephonic AWV. Direct # left to CB.

## 2019-08-03 NOTE — Telephone Encounter (Signed)
Scheduled a telephonic visit for 08/09/19 @ 11:00 AM.
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
## 2019-08-09 ENCOUNTER — Ambulatory Visit (INDEPENDENT_AMBULATORY_CARE_PROVIDER_SITE_OTHER): Payer: Medicare Other

## 2019-08-09 ENCOUNTER — Other Ambulatory Visit: Payer: Self-pay

## 2019-08-09 DIAGNOSIS — Z0000 Encounter for general adult medical examination without abnormal findings: Secondary | ICD-10-CM

## 2019-08-09 NOTE — Patient Instructions (Signed)
Ms. Biswas , Thank you for taking time to come for your Medicare Wellness Visit. I appreciate your ongoing commitment to your health goals. Please review the following plan we discussed and let me know if I can assist you in the future.   Screening recommendations/referrals: Colonoscopy: No longer required.  Mammogram: No longer required.  Bone Density: Up to date. Previous DEXA scan was normal. No repeat needed unless advised by a physician. Recommended yearly ophthalmology/optometry visit for glaucoma screening and checkup Recommended yearly dental visit for hygiene and checkup  Vaccinations: Influenza vaccine: Up to date Pneumococcal vaccine: Completed series Tdap vaccine: Pt declines today.  Shingles vaccine: Pt declines today.     Advanced directives: Currently on file.  Conditions/risks identified: Recommend to focus on drinking only water and increasing daily intake.  Next appointment: 09/20/19 @ 2:20 PM with Dr Rosanna Randy.    Preventive Care 82 Years and Older, Female Preventive care refers to lifestyle choices and visits with your health care provider that can promote health and wellness. What does preventive care include?  A yearly physical exam. This is also called an annual well check.  Dental exams once or twice a year.  Routine eye exams. Ask your health care provider how often you should have your eyes checked.  Personal lifestyle choices, including:  Daily care of your teeth and gums.  Regular physical activity.  Eating a healthy diet.  Avoiding tobacco and drug use.  Limiting alcohol use.  Practicing safe sex.  Taking low-dose aspirin every day.  Taking vitamin and mineral supplements as recommended by your health care provider. What happens during an annual well check? The services and screenings done by your health care provider during your annual well check will depend on your age, overall health, lifestyle risk factors, and family history of  disease. Counseling  Your health care provider may ask you questions about your:  Alcohol use.  Tobacco use.  Drug use.  Emotional well-being.  Home and relationship well-being.  Sexual activity.  Eating habits.  History of falls.  Memory and ability to understand (cognition).  Work and work Statistician.  Reproductive health. Screening  You may have the following tests or measurements:  Height, weight, and BMI.  Blood pressure.  Lipid and cholesterol levels. These may be checked every 5 years, or more frequently if you are over 34 years old.  Skin check.  Lung cancer screening. You may have this screening every year starting at age 44 if you have a 30-pack-year history of smoking and currently smoke or have quit within the past 15 years.  Fecal occult blood test (FOBT) of the stool. You may have this test every year starting at age 29.  Flexible sigmoidoscopy or colonoscopy. You may have a sigmoidoscopy every 5 years or a colonoscopy every 10 years starting at age 55.  Hepatitis C blood test.  Hepatitis B blood test.  Sexually transmitted disease (STD) testing.  Diabetes screening. This is done by checking your blood sugar (glucose) after you have not eaten for a while (fasting). You may have this done every 1-3 years.  Bone density scan. This is done to screen for osteoporosis. You may have this done starting at age 27.  Mammogram. This may be done every 1-2 years. Talk to your health care provider about how often you should have regular mammograms. Talk with your health care provider about your test results, treatment options, and if necessary, the need for more tests. Vaccines  Your health care provider  may recommend certain vaccines, such as:  Influenza vaccine. This is recommended every year.  Tetanus, diphtheria, and acellular pertussis (Tdap, Td) vaccine. You may need a Td booster every 10 years.  Zoster vaccine. You may need this after age  83.  Pneumococcal 13-valent conjugate (PCV13) vaccine. One dose is recommended after age 14.  Pneumococcal polysaccharide (PPSV23) vaccine. One dose is recommended after age 78. Talk to your health care provider about which screenings and vaccines you need and how often you need them. This information is not intended to replace advice given to you by your health care provider. Make sure you discuss any questions you have with your health care provider. Document Released: 06/28/2015 Document Revised: 02/19/2016 Document Reviewed: 04/02/2015 Elsevier Interactive Patient Education  2017 [employer] Prevention in the Home Falls can cause injuries. They can happen to people of all ages. There are many things you can do to make your home safe and to help prevent falls. What can I do on the outside of my home?  Regularly fix the edges of walkways and driveways and fix any cracks.  Remove anything that might make you trip as you walk through a door, such as a raised step or threshold.  Trim any bushes or trees on the path to your home.  Use bright outdoor lighting.  Clear any walking paths of anything that might make someone trip, such as rocks or tools.  Regularly check to see if handrails are loose or broken. Make sure that both sides of any steps have handrails.  Any raised decks and porches should have guardrails on the edges.  Have any leaves, snow, or ice cleared regularly.  Use sand or salt on walking paths during winter.  Clean up any spills in your garage right away. This includes oil or grease spills. What can I do in the bathroom?  Use night lights.  Install grab bars by the toilet and in the tub and shower. Do not use towel bars as grab bars.  Use non-skid mats or decals in the tub or shower.  If you need to sit down in the shower, use a plastic, non-slip stool.  Keep the floor dry. Clean up any water that spills on the floor as soon as it happens.  Remove  soap buildup in the tub or shower regularly.  Attach bath mats securely with double-sided non-slip rug tape.  Do not have throw rugs and other things on the floor that can make you trip. What can I do in the bedroom?  Use night lights.  Make sure that you have a light by your bed that is easy to reach.  Do not use any sheets or blankets that are too big for your bed. They should not hang down onto the floor.  Have a firm chair that has side arms. You can use this for support while you get dressed.  Do not have throw rugs and other things on the floor that can make you trip. What can I do in the kitchen?  Clean up any spills right away.  Avoid walking on wet floors.  Keep items that you use a lot in easy-to-reach places.  If you need to reach something above you, use a strong step stool that has a grab bar.  Keep electrical cords out of the way.  Do not use floor polish or wax that makes floors slippery. If you must use wax, use non-skid floor wax.  Do not have throw rugs  and other things on the floor that can make you trip. What can I do with my stairs?  Do not leave any items on the stairs.  Make sure that there are handrails on both sides of the stairs and use them. Fix handrails that are broken or loose. Make sure that handrails are as long as the stairways.  Check any carpeting to make sure that it is firmly attached to the stairs. Fix any carpet that is loose or worn.  Avoid having throw rugs at the top or bottom of the stairs. If you do have throw rugs, attach them to the floor with carpet tape.  Make sure that you have a light switch at the top of the stairs and the bottom of the stairs. If you do not have them, ask someone to add them for you. What else can I do to help prevent falls?  Wear shoes that:  Do not have high heels.  Have rubber bottoms.  Are comfortable and fit you well.  Are closed at the toe. Do not wear sandals.  If you use a  stepladder:  Make sure that it is fully opened. Do not climb a closed stepladder.  Make sure that both sides of the stepladder are locked into place.  Ask someone to hold it for you, if possible.  Clearly mark and make sure that you can see:  Any grab bars or handrails.  First and last steps.  Where the edge of each step is.  Use tools that help you move around (mobility aids) if they are needed. These include:  Canes.  Walkers.  Scooters.  Crutches.  Turn on the lights when you go into a dark area. Replace any light bulbs as soon as they burn out.  Set up your furniture so you have a clear path. Avoid moving your furniture around.  If any of your floors are uneven, fix them.  If there are any pets around you, be aware of where they are.  Review your medicines with your doctor. Some medicines can make you feel dizzy. This can increase your chance of falling. Ask your doctor what other things that you can do to help prevent falls. This information is not intended to replace advice given to you by your health care provider. Make sure you discuss any questions you have with your health care provider. Document Released: 03/28/2009 Document Revised: 11/07/2015 Document Reviewed: 07/06/2014 Elsevier Interactive Patient Education  2017 [employer].

## 2019-08-17 DIAGNOSIS — H353231 Exudative age-related macular degeneration, bilateral, with active choroidal neovascularization: Secondary | ICD-10-CM | POA: Diagnosis not present

## 2019-08-21 DIAGNOSIS — M1712 Unilateral primary osteoarthritis, left knee: Secondary | ICD-10-CM | POA: Diagnosis not present

## 2019-08-22 ENCOUNTER — Other Ambulatory Visit: Payer: Self-pay

## 2019-08-22 ENCOUNTER — Ambulatory Visit: Payer: Medicare Other | Attending: Family | Admitting: Family

## 2019-08-22 ENCOUNTER — Encounter: Payer: Self-pay | Admitting: Family

## 2019-08-22 VITALS — BP 149/72 | HR 97 | Resp 18 | Ht 62.0 in | Wt 134.6 lb

## 2019-08-22 DIAGNOSIS — Z885 Allergy status to narcotic agent status: Secondary | ICD-10-CM | POA: Diagnosis not present

## 2019-08-22 DIAGNOSIS — I10 Essential (primary) hypertension: Secondary | ICD-10-CM

## 2019-08-22 DIAGNOSIS — Z7982 Long term (current) use of aspirin: Secondary | ICD-10-CM | POA: Insufficient documentation

## 2019-08-22 DIAGNOSIS — I110 Hypertensive heart disease with heart failure: Secondary | ICD-10-CM | POA: Diagnosis not present

## 2019-08-22 DIAGNOSIS — Z8249 Family history of ischemic heart disease and other diseases of the circulatory system: Secondary | ICD-10-CM | POA: Insufficient documentation

## 2019-08-22 DIAGNOSIS — Z880 Allergy status to penicillin: Secondary | ICD-10-CM | POA: Diagnosis not present

## 2019-08-22 DIAGNOSIS — Z85828 Personal history of other malignant neoplasm of skin: Secondary | ICD-10-CM | POA: Insufficient documentation

## 2019-08-22 DIAGNOSIS — K219 Gastro-esophageal reflux disease without esophagitis: Secondary | ICD-10-CM | POA: Diagnosis not present

## 2019-08-22 DIAGNOSIS — R05 Cough: Secondary | ICD-10-CM

## 2019-08-22 DIAGNOSIS — Z882 Allergy status to sulfonamides status: Secondary | ICD-10-CM | POA: Insufficient documentation

## 2019-08-22 DIAGNOSIS — R059 Cough, unspecified: Secondary | ICD-10-CM

## 2019-08-22 DIAGNOSIS — I252 Old myocardial infarction: Secondary | ICD-10-CM | POA: Diagnosis not present

## 2019-08-22 DIAGNOSIS — I5032 Chronic diastolic (congestive) heart failure: Secondary | ICD-10-CM | POA: Insufficient documentation

## 2019-08-22 DIAGNOSIS — Z79899 Other long term (current) drug therapy: Secondary | ICD-10-CM | POA: Insufficient documentation

## 2019-08-22 NOTE — Patient Instructions (Signed)
Continue weighing daily and call for an overnight weight gain of > 2 pounds or a weekly weight gain of >5 pounds. 
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
## 2019-08-28 DIAGNOSIS — M1712 Unilateral primary osteoarthritis, left knee: Secondary | ICD-10-CM | POA: Diagnosis not present

## 2019-08-31 ENCOUNTER — Encounter: Payer: Self-pay | Admitting: *Deleted

## 2019-09-04 DIAGNOSIS — M1712 Unilateral primary osteoarthritis, left knee: Secondary | ICD-10-CM | POA: Diagnosis not present

## 2019-09-06 DIAGNOSIS — I2510 Atherosclerotic heart disease of native coronary artery without angina pectoris: Secondary | ICD-10-CM | POA: Diagnosis not present

## 2019-09-06 DIAGNOSIS — I350 Nonrheumatic aortic (valve) stenosis: Secondary | ICD-10-CM | POA: Diagnosis not present

## 2019-09-06 DIAGNOSIS — I10 Essential (primary) hypertension: Secondary | ICD-10-CM | POA: Diagnosis not present

## 2019-09-06 DIAGNOSIS — I5022 Chronic systolic (congestive) heart failure: Secondary | ICD-10-CM | POA: Diagnosis not present

## 2019-09-06 DIAGNOSIS — I119 Hypertensive heart disease without heart failure: Secondary | ICD-10-CM | POA: Diagnosis not present

## 2019-09-06 DIAGNOSIS — I5043 Acute on chronic combined systolic (congestive) and diastolic (congestive) heart failure: Secondary | ICD-10-CM | POA: Insufficient documentation

## 2019-09-11 DIAGNOSIS — M1712 Unilateral primary osteoarthritis, left knee: Secondary | ICD-10-CM | POA: Diagnosis not present

## 2019-09-13 ENCOUNTER — Ambulatory Visit: Payer: Medicare Other | Admitting: Family Medicine

## 2019-09-14 DIAGNOSIS — H353231 Exudative age-related macular degeneration, bilateral, with active choroidal neovascularization: Secondary | ICD-10-CM | POA: Diagnosis not present

## 2019-09-20 ENCOUNTER — Ambulatory Visit: Payer: Medicare Other | Admitting: Family Medicine

## 2019-09-25 DIAGNOSIS — H00012 Hordeolum externum right lower eyelid: Secondary | ICD-10-CM | POA: Diagnosis not present

## 2019-09-29 NOTE — Progress Notes (Signed)
Established patient visit      I,[name],acting as a scribe for [name], MD.,have documented all relevant documentation on the behalf of [name], MD,as directed by  [name], MD while in the presence of [name], MD.   Patient: Teresa Moyer   DOB: [birth year]   83 y.o. Female  MRN: [id number] Visit Date: 10/02/2019  Today's healthcare provider: [name], MD  Subjective:    Chief Complaint  Patient presents with  . Follow-up  . Hypertension   HPI  She has had problems with a stye in the right eye over the past week which is improving.  She was seen by her optometrist and warm compresses are helping. Hypertension, follow-up  BP Readings from Last 3 Encounters:  10/02/19 (!) 151/75  08/22/19 (!) 149/72  07/13/19 (!) 155/81   Wt Readings from Last 3 Encounters:  10/02/19 139 lb (63 kg)  08/22/19 134 lb 9.6 oz (61.1 kg)  07/13/19 137 lb (62.1 kg)   She was last seen for hypertension 3 months ago.  BP at that visit was 155/81. Management since that visit includes; Controlled on Avapro and Coreg . She reports good compliance with treatment. She is not having side effects. none She is not exercising. She is adherent to low salt diet.   Outside blood pressures are 130/70. She does not smoke. She is following a Regular, mostly Healthy diet. Use of agents associated with hypertension: none.   Last basic metabolic panel Lab Results  Component Value Date   GLUCOSE 125 (H) 02/04/2019   NA 132 (L) 02/04/2019   K 4.8 02/04/2019   CL 99 02/04/2019   CO2 22 02/04/2019   BUN 18 02/04/2019   CREATININE 0.67 02/04/2019   GFRNONAA >60 02/04/2019   GFRAA >60 02/04/2019   CALCIUM 9.0 02/04/2019   Last lipids Lab Results  Component Value Date   GLUCOSE 125 (H) 02/04/2019   NA 132 (L) 02/04/2019   K 4.8 02/04/2019   CL 99 02/04/2019   CO2 22 02/04/2019   BUN 18 02/04/2019   CREATININE 0.67 02/04/2019   GFRNONAA  >60 02/04/2019   GFRAA >60 02/04/2019   CALCIUM 9.0 02/04/2019   PROT 7.5 01/30/2019   ALBUMIN 4.1 01/30/2019   LABGLOB 2.5 01/11/2019   AGRATIO 1.6 01/11/2019   BILITOT 1.3 (H) 01/30/2019   ALKPHOS 175 (H) 01/30/2019   AST 27 01/30/2019   ALT 25 01/30/2019   ANIONGAP 11 02/04/2019   The ASCVD Risk score (Goff DC Jr., et al., 2013) failed to calculate for the following reasons:   The 2013 ASCVD risk score is only valid for ages 60 to 57   The patient has a prior MI or stroke diagnosis   --------------------------------------------------------------------      Medications: Outpatient Medications Prior to Visit  Medication Sig  . acetaminophen (TYLENOL) 650 MG CR tablet Take 650 mg by mouth every 8 (eight) hours as needed for pain.  Marland Kitchen aspirin 81 MG chewable tablet Chew 1 tablet (81 mg total) by mouth daily.  Marland Kitchen atorvastatin (LIPITOR) 40 MG tablet Take 1 tablet (40 mg total) by mouth daily at 6 PM.  . azelastine (ASTELIN) 0.1 % nasal spray Place 1 spray into both nostrils at bedtime.   . Carboxymethylcell-Hypromellose (GENTEAL) 0.25-0.3 % GEL Apply 1 application to eye at bedtime.  . carvedilol (COREG) 12.5 MG tablet Take by mouth.  . cetirizine (ZYRTEC ALLERGY) 10 MG tablet Take 10 mg  by mouth at bedtime.   . clopidogrel (PLAVIX) 75 MG tablet Take 1 tablet by mouth daily.  . fluticasone (FLONASE) 50 MCG/ACT nasal spray Place 1 spray into both nostrils daily.   . furosemide (LASIX) 20 MG tablet Take 1 tablet (20 mg total) by mouth daily. Take 40 mg daily for 3 days,till Friday,after that resume 20 mg daily (Patient taking differently: Take 20 mg by mouth daily. )  . irbesartan (AVAPRO) 300 MG tablet Take 0.5 tablets (150 mg total) by mouth daily.  . Multiple Vitamins-Minerals (PRESERVISION AREDS PO) Take 1 tablet by mouth 2 (two) times daily.   . Nutritional Supplements (JOINT FORMULA PO) Take 1 capsule by mouth daily before breakfast. Arthrozene  . omeprazole (PRILOSEC) 20 MG  capsule Take 1 capsule (20 mg total) by mouth daily.  Vladimir Faster Glycol-Propyl Glycol (SYSTANE) 0.4-0.3 % SOLN Place 1 drop into both eyes 3 (three) times daily as needed (for dry eyes).  . potassium chloride (KLOR-CON 10) 10 MEQ tablet Take 1 tablet (10 mEq total) by mouth daily.  . Probiotic Product (ALIGN) 4 MG CAPS Take 4 mg by mouth daily after lunch.   . traMADol (ULTRAM) 50 MG tablet TAKE 1 TABLET BY MOUTH EVERY 6 HOURS AS NEEDED (Patient taking differently: 25 mg. )  . spironolactone (ALDACTONE) 25 MG tablet Take 25 mg by mouth daily.  . [DISCONTINUED] carvedilol (COREG) 6.25 MG tablet Take 1 tablet (6.25 mg total) by mouth 2 (two) times daily with a meal.   No facility-administered medications prior to visit.    Review of Systems  Constitutional: Negative for appetite change, chills, fatigue and fever.  HENT: Negative.   Eyes:       She has a stye on her right eye which is improving.  She is using warm compresses.  Respiratory: Negative for chest tightness and shortness of breath.   Cardiovascular: Negative for chest pain and palpitations.  Gastrointestinal: Negative for abdominal pain, nausea and vomiting.  Endocrine: Negative.   Musculoskeletal: Positive for arthralgias.  Allergic/Immunologic: Negative.   Neurological: Negative for dizziness and weakness.  Psychiatric/Behavioral: Negative.          Objective:    BP (!) 151/75 (BP Location: Right Arm, Patient Position: Sitting, Cuff Size: Normal)   Pulse 71   Temp (!) 97.3 F (36.3 C) (Other (Comment))   Resp 16   Ht 5\' 2"  (1.575 m)   Wt 139 lb (63 kg)   SpO2 96%   BMI 25.42 kg/m  BP Readings from Last 3 Encounters:  10/02/19 (!) 151/75  08/22/19 (!) 149/72  07/13/19 (!) 155/81   Wt Readings from Last 3 Encounters:  10/02/19 139 lb (63 kg)  08/22/19 134 lb 9.6 oz (61.1 kg)  07/13/19 137 lb (62.1 kg)      Physical Exam Constitutional:      Appearance: Normal appearance.  HENT:     Head: Normocephalic  and atraumatic.     Right Ear: External ear normal.     Left Ear: External ear normal.  Eyes:     General: No scleral icterus.    Conjunctiva/sclera: Conjunctivae normal.     Comments: Is very small's on right inner eyelid.  Conjunctiva and sclera otherwise unremarkable  Cardiovascular:     Rate and Rhythm: Normal rate and regular rhythm.     Pulses: Normal pulses.     Heart sounds: Normal heart sounds.  Pulmonary:     Breath sounds: Normal breath sounds.  Abdominal:  Palpations: Abdomen is soft.  Skin:    General: Skin is warm and dry.  Neurological:     Mental Status: She is alert and oriented to person, place, and time. Mental status is at baseline.  Psychiatric:        Mood and Affect: Mood normal.        Behavior: Behavior normal.        Thought Content: Thought content normal.        Judgment: Judgment normal.       No results found for any visits on 10/02/19.    Assessment & Plan:     1. Essential (primary) hypertension Controlled on Coreg and spironolactone. .rg 2. Hordeolum externum of right lower eyelid Improving, continue to use warm compresses.  3. Arthritis She is having a terrible time with arthritis of the knees.  She will probably looking at knee replacement by the end of the year.  4. Coronary artery disease involving native coronary artery of native heart without angina pectoris All risk factors treated.  Patient on Lipitor.   Follow up in 6 months for CPE.   I, [name], MD, have reviewed all documentation for this visit. The documentation on 10/03/19 for the exam, diagnosis, procedures, and orders are all accurate and complete.    Donasia Wimes Cranford Mon, MD  [facility] [phone] (phone) [phone] (fax)  [place]

## 2019-10-02 ENCOUNTER — Other Ambulatory Visit: Payer: Self-pay

## 2019-10-02 ENCOUNTER — Encounter: Payer: Self-pay | Admitting: Family Medicine

## 2019-10-02 ENCOUNTER — Ambulatory Visit (INDEPENDENT_AMBULATORY_CARE_PROVIDER_SITE_OTHER): Payer: Medicare Other | Admitting: Family Medicine

## 2019-10-02 VITALS — BP 151/75 | HR 71 | Temp 97.3°F | Resp 16 | Ht 62.0 in | Wt 139.0 lb

## 2019-10-02 DIAGNOSIS — H00012 Hordeolum externum right lower eyelid: Secondary | ICD-10-CM

## 2019-10-02 DIAGNOSIS — I10 Essential (primary) hypertension: Secondary | ICD-10-CM | POA: Diagnosis not present

## 2019-10-02 DIAGNOSIS — I2510 Atherosclerotic heart disease of native coronary artery without angina pectoris: Secondary | ICD-10-CM

## 2019-10-02 DIAGNOSIS — M1990 Unspecified osteoarthritis, unspecified site: Secondary | ICD-10-CM

## 2019-10-02 NOTE — Patient Instructions (Addendum)
Stye-right lower lid, use hot compresses. Next visit, bring in blood pressure cuff and readings.

## 2019-10-10 DIAGNOSIS — M1712 Unilateral primary osteoarthritis, left knee: Secondary | ICD-10-CM | POA: Diagnosis not present

## 2019-10-11 DIAGNOSIS — I2510 Atherosclerotic heart disease of native coronary artery without angina pectoris: Secondary | ICD-10-CM | POA: Diagnosis not present

## 2019-10-11 DIAGNOSIS — I5022 Chronic systolic (congestive) heart failure: Secondary | ICD-10-CM | POA: Diagnosis not present
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
## 2019-10-12 ENCOUNTER — Ambulatory Visit: Payer: Medicare Other | Attending: Family | Admitting: Family

## 2019-10-12 ENCOUNTER — Encounter: Payer: Self-pay | Admitting: Family

## 2019-10-12 ENCOUNTER — Other Ambulatory Visit: Payer: Self-pay

## 2019-10-12 VITALS — BP 138/78 | HR 87 | Resp 16 | Ht 62.0 in | Wt 138.5 lb

## 2019-10-12 DIAGNOSIS — I110 Hypertensive heart disease with heart failure: Secondary | ICD-10-CM | POA: Insufficient documentation

## 2019-10-12 DIAGNOSIS — Z885 Allergy status to narcotic agent status: Secondary | ICD-10-CM | POA: Diagnosis not present

## 2019-10-12 DIAGNOSIS — Z882 Allergy status to sulfonamides status: Secondary | ICD-10-CM | POA: Diagnosis not present

## 2019-10-12 DIAGNOSIS — I5022 Chronic systolic (congestive) heart failure: Secondary | ICD-10-CM

## 2019-10-12 DIAGNOSIS — I252 Old myocardial infarction: Secondary | ICD-10-CM | POA: Insufficient documentation

## 2019-10-12 DIAGNOSIS — K219 Gastro-esophageal reflux disease without esophagitis: Secondary | ICD-10-CM | POA: Insufficient documentation

## 2019-10-12 DIAGNOSIS — M1990 Unspecified osteoarthritis, unspecified site: Secondary | ICD-10-CM | POA: Diagnosis not present

## 2019-10-12 DIAGNOSIS — Z880 Allergy status to penicillin: Secondary | ICD-10-CM | POA: Diagnosis not present

## 2019-10-12 DIAGNOSIS — I509 Heart failure, unspecified: Secondary | ICD-10-CM | POA: Insufficient documentation

## 2019-10-12 DIAGNOSIS — Z79899 Other long term (current) drug therapy: Secondary | ICD-10-CM | POA: Diagnosis not present

## 2019-10-12 DIAGNOSIS — I10 Essential (primary) hypertension: Secondary | ICD-10-CM

## 2019-10-12 NOTE — Patient Instructions (Addendum)
Continue weighing daily and call for an overnight weight gain of > 2 pounds or a weekly weight gain of >5 pounds. 

## 2019-10-18 DIAGNOSIS — I5022 Chronic systolic (congestive) heart failure: Secondary | ICD-10-CM | POA: Diagnosis not present

## 2019-10-18 DIAGNOSIS — I10 Essential (primary) hypertension: Secondary | ICD-10-CM | POA: Diagnosis not present

## 2019-10-18 DIAGNOSIS — I350 Nonrheumatic aortic (valve) stenosis: Secondary | ICD-10-CM | POA: Diagnosis not present

## 2019-10-18 DIAGNOSIS — I2510 Atherosclerotic heart disease of native coronary artery without angina pectoris: Secondary | ICD-10-CM | POA: Diagnosis not present

## 2019-10-19 DIAGNOSIS — H353231 Exudative age-related macular degeneration, bilateral, with active choroidal neovascularization: Secondary | ICD-10-CM | POA: Diagnosis not present

## 2019-10-23 DIAGNOSIS — D2261 Melanocytic nevi of right upper limb, including shoulder: Secondary | ICD-10-CM | POA: Diagnosis not present

## 2019-10-23 DIAGNOSIS — D2271 Melanocytic nevi of right lower limb, including hip: Secondary | ICD-10-CM | POA: Diagnosis not present

## 2019-10-23 DIAGNOSIS — D225 Melanocytic nevi of trunk: Secondary | ICD-10-CM | POA: Diagnosis not present

## 2019-10-23 DIAGNOSIS — D485 Neoplasm of uncertain behavior of skin: Secondary | ICD-10-CM | POA: Diagnosis not present

## 2019-10-23 DIAGNOSIS — Z85828 Personal history of other malignant neoplasm of skin: Secondary | ICD-10-CM | POA: Diagnosis not present

## 2019-10-23 DIAGNOSIS — D2262 Melanocytic nevi of left upper limb, including shoulder: Secondary | ICD-10-CM | POA: Diagnosis not present

## 2019-10-26 ENCOUNTER — Other Ambulatory Visit: Payer: Self-pay | Admitting: Family Medicine

## 2019-10-26 DIAGNOSIS — K219 Gastro-esophageal reflux disease without esophagitis: Secondary | ICD-10-CM

## 2019-10-30 DIAGNOSIS — M65341 Trigger finger, right ring finger: Secondary | ICD-10-CM | POA: Diagnosis not present

## 2019-10-31 DIAGNOSIS — C44712 Basal cell carcinoma of skin of right lower limb, including hip: Secondary | ICD-10-CM | POA: Diagnosis not present

## 2019-11-01 ENCOUNTER — Other Ambulatory Visit: Payer: Self-pay | Admitting: Family Medicine

## 2019-11-01 NOTE — Telephone Encounter (Signed)
Requested medication (s) are due for refill today: yes  Requested medication (s) are on the active medication list: yes  Last refill:  09/28/2019  Future visit scheduled: yes   Notes to clinic:  medication was last filled by a different provider  Review for refill   Requested Prescriptions  Pending Prescriptions Disp Refills   furosemide (LASIX) 20 MG tablet [Pharmacy Med Name: FUROSEMIDE 20 MG TAB] 30 tablet 1    Sig: TAKE 1 TABLET BY MOUTH ONCE DAILY      Cardiovascular:  Diuretics - Loop Failed - 11/01/2019 12:41 PM      Failed - Na in normal range and within 360 days    Sodium  Date Value Ref Range Status  02/04/2019 132 (L) 135 - 145 mmol/L Final  01/11/2019 138 134 - 144 mmol/L Final          Passed - K in normal range and within 360 days    Potassium  Date Value Ref Range Status  02/04/2019 4.8 3.5 - 5.1 mmol/L Final          Passed - Ca in normal range and within 360 days    Calcium  Date Value Ref Range Status  02/04/2019 9.0 8.9 - 10.3 mg/dL Final          Passed - Cr in normal range and within 360 days    Creatinine, Ser  Date Value Ref Range Status  02/04/2019 0.67 0.44 - 1.00 mg/dL Final          Passed - Last BP in normal range    BP Readings from Last 1 Encounters:  10/12/19 138/78          Passed - Valid encounter within last 6 months    Recent Outpatient Visits           1 month ago Essential (primary) hypertension   [facility] Jerrol Banana., MD   3 months ago Sprain of left knee, unspecified ligament, initial encounter   [facility] Jerrol Banana., MD   4 months ago Sprain of left knee, unspecified ligament, initial encounter   [facility] Jerrol Banana., MD   7 months ago Need for influenza vaccination   [facility] Jerrol Banana., MD   8 months ago STEMI involving left anterior descending coronary artery [facility])   [facility]  Jerrol Banana., MD

## 2019-11-28 DIAGNOSIS — H353231 Exudative age-related macular degeneration, bilateral, with active choroidal neovascularization: Secondary | ICD-10-CM | POA: Diagnosis not present

## 2019-12-09 NOTE — Discharge Instructions (Signed)
Instructions after Total Knee Replacement   Teresa Moyer, Jr., M.D.     Dept. of Orthopaedics & Sports Medicine  [facility]  [address]  [place], [place]  [address]  Phone: [phone]   Fax: [phone]    DIET: Drink plenty of non-alcoholic fluids. Resume your normal diet. Include foods high in fiber.  ACTIVITY:  You may use crutches or a walker with weight-bearing as tolerated, unless instructed otherwise. You may be weaned off of the walker or crutches by your Physical Therapist.  Do NOT place pillows under the knee. Anything placed under the knee could limit your ability to straighten the knee.   Continue doing gentle exercises. Exercising will reduce the pain and swelling, increase motion, and prevent muscle weakness.   Please continue to use the TED compression stockings for 6 weeks. You may remove the stockings at night, but should reapply them in the morning. Do not drive or operate any equipment until instructed.  WOUND CARE:  Continue to use the PolarCare or ice packs periodically to reduce pain and swelling. You may bathe or shower after the staples are removed at the first office visit following surgery.  MEDICATIONS: You may resume your regular medications. Please take the pain medication as prescribed on the medication. Do not take pain medication on an empty stomach. You have been given a prescription for a blood thinner (Lovenox or Coumadin). Please take the medication as instructed. (NOTE: After completing a 2 week course of Lovenox, take one Enteric-coated aspirin once a day. This along with elevation will help reduce the possibility of phlebitis in your operated leg.) Do not drive or drink alcoholic beverages when taking pain medications.  CALL THE OFFICE FOR: Temperature above 101 degrees Excessive bleeding or drainage on the dressing. Excessive swelling, coldness, or paleness of the toes. Persistent nausea and vomiting.  FOLLOW-UP:  You  should have an appointment to return to the office in 10-14 days after surgery. Arrangements have been made for continuation of Physical Therapy (either home therapy or outpatient therapy).   [facility] Department Directory         [email or web address]       [email or web address]          Cardiology  Appointments: [place] - [phone] [place] - [phone]  Endocrinology  Appointments: [place] - [phone] [place] - [phone]  Gastroenterology  Appointments: [place] - [phone] [place] - [phone]        General Surgery   Appointments: [place] - [phone]  Internal Medicine/Family Medicine  Appointments: [place] - [phone] [place] - [phone] [place] - [phone]  Metabolic and Weigh Loss Surgery  Appointments: [place] - [phone]        Neurology  Appointments: [place] - [phone] [place] - [phone]  Neurosurgery  Appointments: [place] - [phone]  Obstetrics & Gynecology  Appointments: [place] - [phone] [place] - [phone]        Pediatrics  Appointments: [place] - [phone] [place] - [phone]  Physiatry  Appointments: [place] -[phone]  Physical Therapy  Appointments: [place] - [phone] [place] - [phone]        Podiatry  Appointments: [place] - [phone] [place] - [phone]  Pulmonology  Appointments: [place] - [phone]  Rheumatology  Appointments: [place] - [phone]        [place] Location: [facility]  [address] , [place]  [address]  [place] Location: [facility] [address] [place], [place]  [address]  [place] Location: [facility] [address] [place], [place]  [address]    

## 2019-12-12 ENCOUNTER — Other Ambulatory Visit: Payer: Self-pay

## 2019-12-12 ENCOUNTER — Encounter
Admission: RE | Admit: 2019-12-12 | Discharge: 2019-12-12 | Disposition: A | Discharge status: Home / Self Care | Payer: Medicare Other | Source: Ambulatory Visit | Attending: Orthopedic Surgery | Admitting: Orthopedic Surgery

## 2019-12-12 DIAGNOSIS — Z01812 Encounter for preprocedural laboratory examination: Secondary | ICD-10-CM | POA: Diagnosis present

## 2019-12-12 HISTORY — DX: Family history of other specified conditions: Z84.89

## 2019-12-12 HISTORY — DX: Atherosclerotic heart disease of native coronary artery without angina pectoris: I25.10

## 2019-12-12 HISTORY — DX: Unspecified macular degeneration: H35.30

## 2019-12-12 NOTE — Patient Instructions (Addendum)
Your procedure is scheduled on: 12-20-19 [facility] Report to Same Day Surgery 2nd floor medical mall [facility] Entrance-take elevator on left to 2nd floor.  Check in with surgery information desk.) To find out your arrival time please call [phone] between 1PM - 3PM on 12-19-19 TUESDAY  Remember: Instructions that are not followed completely may result in serious medical risk, up to and including death, or upon the discretion of your surgeon and anesthesiologist your surgery may need to be rescheduled.    _x___ 1. Do not eat food after midnight the night before your procedure. NO GUM OR CANDY AFTER MIDNIGHT. You may drink clear liquids up to 2 hours before you are scheduled to arrive at the hospital for your procedure.  Do not drink clear liquids within 2 hours of your scheduled arrival to the hospital.  Clear liquids include  --Water or Apple juice without pulp  --Gatorade  --Black Coffee or Clear Tea (No milk, no creamers, do not add anything to the coffee or Tea-ok to add sugar)  _X___Ensure clear carbohydrate drink-FINISH DRINK 2 HOURS PRIOR TO ARRIVAL TIME TO HOSPITAL DAY OF SURGERY    __x__ 2. No Alcohol for 24 hours before or after surgery.   __x__3. No Smoking or e-cigarettes for 24 prior to surgery.  Do not use any chewable tobacco products for at least 6 hour prior to surgery   ____  4. Bring all medications with you on the day of surgery if instructed.    __x__ 5. Notify your doctor if there is any change in your medical condition     (cold, fever, infections).    x___6. On the morning of surgery brush your teeth with toothpaste and water.  You may rinse your mouth with mouth wash if you wish.  Do not swallow any toothpaste or mouthwash.   Do not wear jewelry, make-up, hairpins, clips or nail polish.  Do not wear lotions, powders, or perfumes.   Do not shave 48 hours prior to surgery. Men may shave face and neck.  Do not bring valuables to the hospital.    [facility] is not responsible for any belongings or valuables.               Contacts, dentures or bridgework may not be worn into surgery.  Leave your suitcase in the car. After surgery it may be brought to your room.  For patients admitted to the hospital, discharge time is determined by your treatment team.  _  Patients discharged the day of surgery will not be allowed to drive home.  You will need someone to drive you home and stay with you the night of your procedure.    Please read over the following fact sheets that you were given:   [facility] Preparing for Surgery and MRSA Information/INCENTIVE SPIROMETER INSTRUCTIONS  _x___ TAKE THE FOLLOWING MEDICATION THE MORNING OF SURGERY WITH A SMALL SIP OF WATER. These include:  1. COREG (CARVEDILOL)  2.  3.  4.  5.  6.  ____Fleets enema or Magnesium Citrate as directed.   _x___ Use CHG Soap or sage wipes as directed on instruction sheet   ____ Use inhalers on the day of surgery and bring to hospital day of surgery  ____ Stop Metformin and Janumet 2 days prior to surgery.    ____ Take 1/2 of usual insulin dose the night before surgery and none on the morning surgery.   _x___ Follow recommendations from Cardiologist, Pulmonologist or PCP regarding  stopping Aspirin, Coumadin, Plavix ,Eliquis, Effient, or Pradaxa, and Pletal-PER DR HOOTEN'S OFFICE, STOP PLAVIX 7 DAYS PRIOR TO SURGERY (LAST DOSE ON Tuesday 12-12-19)-DR HOOTEN'S OFFICE TO CALL AND INFORM PATIENT WHEN TO STOP 81 MG ASPIRIN  X____Stop Anti-inflammatories such as Advil, Aleve, Ibuprofen, Motrin, Naproxen, Naprosyn, Goodies powders or aspirin products NOW-OK to take Tylenol   _x___ Stop supplements until after surgery-STOP [place]   ____ Bring C-Pap to the hospital.

## 2019-12-12 NOTE — Pre-Procedure Instructions (Signed)
[place] Order# [id number] Reading physician: Nelva Bush, MD Ordering physician: Nelva Bush, MD Study date: 12/27/18  MyChart Results Release  MyChart Status: Active Results Release  Physicians  Panel Physicians Referring Physician Case Authorizing Physician  End, Harrell Gave, MD (Primary)    Procedures  Coronary/Graft Acute MI Revascularization  LEFT HEART CATH AND CORONARY ANGIOGRAPHY  Conclusion  Conclusions: 1. Two-vessel coronary artery disease with thrombotic occlusion of the proximal LAD involving D1 as well as 60% mid RCA stenosis. 2. Moderately to severely reduced left ventricular systolic function (LVEF 03-50%) with moderately elevated left ventricular filling pressure (LVEDP ~30 mmHg). 3. Successful PCI to proximal LAD using Resolute Onyx 3.0 x 18 mm drug-eluting stent with 0% residual stenosis and TIMI-3 flow.  Nonocclusive residual thrombus remains at the ostium of D1.  Recommendations: 1. Admit to ICU for post STEMI monitoring. 2. Dual antiplatelet therapy with aspirin and ticagrelor for at least 12 months. 3. Continue tirofiban infusion for 6 hours after completion of PCI. 4. Gentle diuresis with furosemide 20 mg IV daily. 5. Optimize evidence-based heart failure therapy.  In the setting of acute MI and acute systolic heart failure, I will de-escalate carvedilol and losartan.  Medications should be escalated carefully, as tolerated.  Nelva Bush, MD [facility] Pager: [phone]  Recommendations  Antiplatelet/Anticoag Recommend uninterrupted dual antiplatelet therapy with Aspirin 73m daily and Ticagrelor 958mtwice daily for a minimum of 12 months (ACS-Class I recommendation).  Indications  ST elevation myocardial infarction involving left anterior descending (LAD) coronary artery (HCC) [I21.02 (ICD-10-CM)]  Procedural Details  Technical Details Indication: [age].o. year-old woman with history of  hypertension, hyperlipidemia, and mild aortic stenosis by echo, presenting with acute onset of chest pain this afternoon.  After approximately 30 minutes of pain, she called EMS and was noted to have anterior ST elevation.  Pain improved but did not resolve with sublingual nitroglycerin.  In the ED, she continued to have anterior ST segment elevation and was referred for emergent cardiac catheterization and possible PCI in the setting of STEMI.  GFR: >60 ml/min  Procedure: The risks, benefits, complications, treatment options, and expected outcomes were discussed with the patient, who provided emergent verbal consent.  She was sedated with Versed and Fentanyl. The right wrist was assessed with a modified Allens test which was normal. The right wrist was prepped and draped in a sterile fashion. 1% lidocaine was used for local anesthesia. Using the modified Seldinger access technique, a 870F slender Glidesheath was placed in the right radial artery. 3 mg Verapamil was given through the sheath. Heparin 3,000 units were administered.  Selective coronary angiography was performed using 870F EBU3 guide and 70F JR4 diagnostic catheters to engage the left and right coronary arteries, respectively. Left heart catheterization was performed using a 70F JR4 catheter after PCI to the LAD. Left ventriculogram was performed with a hand injection of contrast.  PCI to LAD: Heparin was used for anticoagulation.  The patient was loaded with ticagrelor and also received a bolus and infusion of tirofiban.  I attempted to cross the proximal LAD occlusion without success using a Runthrough wire.  The wire repeatedly deflected into D1 at the occlusion.  However, this established TIMI I flow into the LAD, which was then successfully wired with a Whisper MS wire.  The proximal LAD stenosis was dilated using a Trek 2.0 x 12 mm balloon at 8 atm.  The lesion was then covered using a Resolute Onyx 3.0 x 18 mm drug-eluting  stent, which was  deployed at 12 atm.  The jailed Runthrough wire was removed and the stent postdilated using an [place] Trek 3.25 x 12 mm balloon at 16 atm.  Final angiogram shows 0% residual stenosis with TIMI-3 flow.  There is a small amount of nonocclusive thrombus at the ostium of D1.  However, given resolution of chest pain and TIMI-3 flow, decision was made not to intervene on this branch.  At the end of the procedure, the radial artery sheath was removed and a TR band applied to achieve patent hemostasis. There were no immediate complications. The patient was taken to the recovery area in stable condition.  Contrast used: 120 mL Fluoroscopy time: 15.0 min Radiation dose: 616 mGy  Estimated blood loss <50 mL.   During this procedure medications were administered to achieve and maintain moderate conscious sedation while the patient's heart rate, blood pressure, and oxygen saturation were continuously monitored and I was present face-to-face 100% of this time.  Medications (Filter: Administrations occurring from 1714 to 1830 on 12/27/18) (important) Continuous medications are totaled by the amount administered until 12/27/18 1830.  heparin injection (Units) Total dose:  6,000 Units  Date/Time  Rate/Dose/Volume Action  12/27/18 1726  3,000 Units Given  1731  3,000 Units Given    midazolam (VERSED) injection (mg) Total dose:  0.5 mg  Date/Time  Rate/Dose/Volume Action  12/27/18 1731  0.5 mg Given    fentaNYL (SUBLIMAZE) injection (mcg) Total dose:  25 mcg  Date/Time  Rate/Dose/Volume Action  12/27/18 1731  25 mcg Given    ticagrelor (BRILINTA) tablet (mg) Total dose:  180 mg  Date/Time  Rate/Dose/Volume Action  12/27/18 1735  180 mg Given    furosemide (LASIX) injection (mg) Total dose:  20 mg  Date/Time  Rate/Dose/Volume Action  12/27/18 1744  20 mg Given    nitroGLYCERIN 1 mg/10 mL (100 mcg/mL) - IR/CATH LAB (mcg) Total dose:  400 mcg  Date/Time  Rate/Dose/Volume Action  12/27/18  1744  200 mcg Given  1759  200 mcg Given    tirofiban (AGGRASTAT) bolus via infusion (mcg/kg) Total dose:  1,587.5 mcg Dosing weight:  63.5  Date/Time  Rate/Dose/Volume Action  12/27/18 1750  1,587.5 mcg Given    tirofiban (AGGRASTAT) infusion 50 mcg/mL 100 mL (mcg/kg/min) Total dose:  Cannot be calculated* Dosing weight:  63.5  *Continuous medication not stopped within the calculation time range. Date/Time  Rate/Dose/Volume Action  12/27/18 1755  0.15 mcg/kg/min - 11.43 mL/hr New Bag/Given    Heparin (Porcine) in NaCl 1000-0.9 UT/500ML-% SOLN (mL) Total volume:  1,000 mL  Date/Time  Rate/Dose/Volume Action  12/27/18 1818  1,000 mL Given    iohexol (OMNIPAQUE) 300 MG/ML solution (mL) Total volume:  120 mL  Date/Time  Rate/Dose/Volume Action  12/27/18 1830  120 mL Given    Sedation Time  Sedation Time Physician-1: 38 minutes  Contrast  Medication Name Total Dose  iohexol (OMNIPAQUE) 300 MG/ML solution 120 mL    Radiation/Fluoro  Fluoro time: 15 (min) DAP: 38.3 (Gycm2) Cumulative Air Kerma: 545 (mGy)  Complications  Complications documented before study signed (12/27/2018 62:56 PM)   No complications were associated with this study.  Documented by Nelva Bush, MD - 12/27/2018 6:52 PM    Coronary Findings  Diagnostic Dominance: Right Left Main  Vessel is large.  Left Anterior Descending  Prox LAD lesion is 30% stenosed.  Prox LAD to Mid LAD lesion is 100% stenosed with 70% stenosed side branch in 1st Diag. The lesion  is thrombotic.  Second Diagonal Branch  Vessel is small in size.  Second Septal Branch  Collaterals  2nd Sept filled by collaterals from RPDA.    Third Diagonal Branch  Vessel is small in size.  Ramus Intermedius  Vessel is small.  Left Circumflex  Vessel is moderate in size. Vessel is angiographically normal.  First Obtuse Marginal Branch  Vessel is small in size.  Second Obtuse Marginal Branch  Vessel is small in size.  Third  Obtuse Marginal Branch  Vessel is small in size.  Right Coronary Artery  Prox RCA lesion is 60% stenosed. The lesion is focal and eccentric.  Right Posterior Descending Artery  Vessel is moderate in size.  Right Posterior Atrioventricular Artery  Vessel is small in size.  Intervention  Prox LAD to Mid LAD lesion with side branch in 1st Diag  Stent - Main Branch  Lesion length: 14 mm. CATHETER LAUNCHER 6FR EBU 3 guide catheter was inserted. Lesion crossed with guidewire using a WIRE HI TORQ WHISPER MS 190CM. Pre-stent angioplasty was performed using a BALLOON [place] RX 2.0X12. Maximum pressure: 8 atm. A drug-eluting stent was successfully placed using a STENT RESOLUTE ONYX 3.0X18. Maximum pressure: 12 atm. Stent strut is well apposed. Post-stent angioplasty was performed using a BALLOON [place] 4.09W11.  Post-Intervention Lesion Assessment  The intervention was successful. Pre-interventional TIMI flow is 0. Post-intervention TIMI flow is 3. No complications occurred at this lesion.  There is a 0% residual stenosis in the main branch post intervention.  There is a 70% residual stenosis in the side branch post intervention.  Wall Motion  Resting               Left Heart  Left Ventricle The left ventricular size is normal. There is moderate to severe left ventricular systolic dysfunction. LVEF 30-35%. LV end diastolic pressure is moderately elevated. LVEDP ~30 mmHg. There are LV function abnormalities due to segmental dysfunction.  Aortic Valve There is no aortic valve stenosis.  Coronary Diagrams  Diagnostic Dominance: Right  Intervention   Implants   Permanent Stent  Stent Resolute Onyx 3.0x18 - [id number] - Implanted  Inventory item: Leonie Douglas ONYX 3.0X18 Model/Cat number: [id number]  Manufacturer: [employer] Lot number: [id number]  Device identifier: [id number] Device identifier type: GS1  GUDID Information  Request status Successful      Brand name: Resolute OnyxT Version/Model: [id number]  Company name: [employer] MRI safety info as of 12/27/18: MR Conditional  Contains dry or latex rubber: No    GMDN P.T. name: Drug-eluting coronary artery stent, non-bioabsorbable-polymer-coated    As of 12/27/2018  Status: Implanted      Syngo Images  Show images for CARDIAC CATHETERIZATION Images on Long Term Storage  Show images for Najia, [place] "[name]" Link to Procedure Log  Procedure Log    Hemo Data (last 3 days) before discharge   AO Systolic Cath Pressure  AO Diastolic Cath Pressure  AO Mean Cath Pressure  LV Systolic Cath Pressure  LV End Diastolic  RV Systolic  RV End Diastolic  RV dP/dt   --  --  --  --  --  178 mmHg  29 mmHg  1536 mmHg/sec   165  82 mmHg  120 mmHg  --  --  --  --  --   --  --  --  32767 mmHg  32767 mmHg  --  --  --   --  --  --  184 mmHg  29  mmHg  --  --  --   --  --  --  178 mmHg  29 mmHg  --  --  --   175  83 mmHg  124 mmHg  --  --  --  --  --  Encounter-Level Documents - 12/27/2018:  Document on 12/31/2018 5:14 PM by Laren Everts: ED PB Billing Extract Scan on 12/30/2018 4:31 PM by Default, Provider, MD Scan on 12/30/2018 2:05 PM by Default, Provider, MD Document on 12/29/2018 12:53 PM by Alen Blew, RN: IP After Visit Summary Scan on 12/28/2018 8:39 AM by Default, Provider, MD Scan on 12/29/2018 9:15 AM by Default, Provider, MD Scan on 12/29/2018 7:04 AM by Default, Provider, MD Scan on 12/27/2018 6:21 PM by Default, Provider, MD Scan on 12/28/2018 11:41 AM by Clair Gulling A Scan on 12/28/2018 11:42 AM by Georgette Shell Documents:  There are no order-level documents. Signed  Electronically signed by Nelva Bush, MD on 12/27/18 at [place] EDT  Electronically addended by Nelva Bush, MD on 12/27/18 at 2254 EDT  External Result Report  External Result Report

## 2019-12-14 ENCOUNTER — Encounter: Payer: Self-pay | Admitting: Urgent Care

## 2019-12-14 ENCOUNTER — Other Ambulatory Visit: Payer: Self-pay

## 2019-12-14 ENCOUNTER — Encounter
Admission: RE | Admit: 2019-12-14 | Discharge: 2019-12-14 | Disposition: A | Discharge status: Home / Self Care | Payer: Medicare Other | Source: Ambulatory Visit | Attending: Orthopedic Surgery | Admitting: Orthopedic Surgery

## 2019-12-14 DIAGNOSIS — Z01812 Encounter for preprocedural laboratory examination: Secondary | ICD-10-CM | POA: Diagnosis not present

## 2019-12-14 LAB — APTT: aPTT: 32 seconds (ref 24–36)

## 2019-12-14 LAB — COMPREHENSIVE METABOLIC PANEL
ALT: 25 U/L (ref 0–44)
AST: 26 U/L (ref 15–41)
Albumin: 3.8 g/dL (ref 3.5–5.0)
Alkaline Phosphatase: 90 U/L (ref 38–126)
Anion gap: 7 (ref 5–15)
BUN: 17 mg/dL (ref 8–23)
CO2: 23 mmol/L (ref 22–32)
Calcium: 8.8 mg/dL — ABNORMAL LOW (ref 8.9–10.3)
Chloride: 98 mmol/L (ref 98–111)
Creatinine, Ser: 0.67 mg/dL (ref 0.44–1.00)
GFR calc Af Amer: >60 mL/min (ref >60)
GFR calc non Af Amer: >60 mL/min (ref >60)
Glucose, Bld: 102 mg/dL — ABNORMAL HIGH (ref 70–99)
Potassium: 4.5 mmol/L (ref 3.5–5.1)
Sodium: 128 mmol/L — ABNORMAL LOW (ref 135–145)
Total Bilirubin: 1.2 mg/dL (ref 0.3–1.2)
Total Protein: 6.8 g/dL (ref 6.5–8.1)

## 2019-12-14 LAB — CBC
HCT: 39.4 % (ref 36.0–46.0)
Hemoglobin: 13.5 g/dL (ref 12.0–15.0)
MCH: 32 pg (ref 26.0–34.0)
MCHC: 34.3 g/dL (ref 30.0–36.0)
MCV: 93.4 fL (ref 80.0–100.0)
Platelets: 203 10*3/uL (ref 150–400)
RBC: 4.22 MIL/uL (ref 3.87–5.11)
RDW: 13.2 % (ref 11.5–15.5)
WBC: 7.7 10*3/uL (ref 4.0–10.5)
nRBC: 0 % (ref 0.0–0.2)

## 2019-12-14 LAB — PROTIME-INR
INR: 1.1 (ref 0.8–1.2)
Prothrombin Time: 13.8 seconds (ref 11.4–15.2)

## 2019-12-14 LAB — URINALYSIS, ROUTINE W REFLEX MICROSCOPIC
Bilirubin Urine: NEGATIVE
Glucose, UA: NEGATIVE mg/dL
Hgb urine dipstick: NEGATIVE
Ketones, ur: NEGATIVE mg/dL
Leukocytes,Ua: NEGATIVE
Nitrite: NEGATIVE
Protein, ur: NEGATIVE mg/dL
Specific Gravity, Urine: 1.02 (ref 1.005–1.030)
pH: 6 (ref 5.0–8.0)

## 2019-12-14 LAB — SURGICAL PCR SCREEN
MRSA, PCR: NEGATIVE
Staphylococcus aureus: POSITIVE — AB

## 2019-12-14 LAB — SEDIMENTATION RATE: Sed Rate: 6 mm/hr (ref 0–30)

## 2019-12-14 LAB — C-REACTIVE PROTEIN: CRP: 0.7 mg/dL (ref <1.0)

## 2019-12-14 LAB — TYPE AND SCREEN
ABO/RH(D): O NEG
Antibody Screen: NEGATIVE
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
## 2019-12-14 NOTE — Pre-Procedure Instructions (Addendum)
Called over to Dr Maree Krabbe office to speak with Margart Sickles (who is not available) regarding pt who has been cleared by Dr Nehemiah Massed, who states that Plavix needs to be held 4 days prior to surgery. Dr Clydell Hakim order in [place] states for Plavix to be held 7 days prior to surgery, even though cardiologist clearance states 4 days. Left message with office staff who said they would relay message to Margaretha Sheffield to see if Dr Marry Guan still wants Plavix held 7 days prior.   Margaretha Sheffield called back and gave Joellen Jersey the message that he wants Plavix stopped 7 days prior to surgery. Instructed pt of exact date that Plavix needs to be stopped

## 2019-12-14 NOTE — Progress Notes (Signed)
  [facility] Perioperative Services: Pre-Admission/Anesthesia Testing  Abnormal Lab Notification  Date: 12/14/19  Name: AYDIA MAJ MRN:   [id number]  Re: Abnormal labs noted during PAT appointment  Provider Notified: Dereck Leep, MD Notification mode: Routed via [place] of concern: Lab Results  Component Value Date   NA 128 (L) 12/14/2019   NA 132 (L) 02/04/2019   NA 133 (L) 02/01/2019    Lab Results  Component Value Date   STAPHAUREUS POSITIVE (A) 12/14/2019   MRSAPCR NEGATIVE 12/14/2019   Honor Loh, MSN, APRN, FNP-C, CEN [place]  Peri-operative Services Nurse Practitioner Phone: [phone] 12/14/19 1:27 PM

## 2019-12-16 LAB — URINE CULTURE
Culture: NO GROWTH
Special Requests: NORMAL

## 2019-12-19 ENCOUNTER — Other Ambulatory Visit
Admission: RE | Admit: 2019-12-19 | Discharge: 2019-12-19 | Disposition: A | Discharge status: Home / Self Care | Payer: Medicare Other | Source: Ambulatory Visit | Attending: Orthopedic Surgery | Admitting: Orthopedic Surgery

## 2019-12-19 ENCOUNTER — Other Ambulatory Visit: Payer: Self-pay

## 2019-12-19 LAB — IGE: IgE (Immunoglobulin E), Serum: <2 IU/mL — ABNORMAL LOW (ref 6–495)

## 2019-12-19 LAB — SARS CORONAVIRUS 2 (TAT 6-24 HRS): SARS Coronavirus 2: NEGATIVE

## 2019-12-19 NOTE — H&P (Signed)
ORTHOPAEDIC HISTORY & PHYSICAL Progress Notes Teresa Moyer, [place] - 12/11/2019 10:00 AM EDT Chief Complaint: Chief Complaint  Patient presents with  . Pre-op Exam  Left TKA 7.7.21   Teresa Moyer is a 84 y.o. female who presents today for her surgical history and physical for upcoming left total knee arthroplasty. Surgery scheduled with Dr. Marry Guan on 12/20/2019. Patient denies any changes in her medical history since she was last evaluated. She denies any numbness or tingling to the left lower extremity. She denies any recent trauma or injury affecting the left knee. The patient denies any personal history of stroke, asthma, COPD or blood clots. She does report a history of a heart attack last year, because of this the patient does take Plavix. She reports a pain score of 10 out of 10 when the left knee "gives out". She denies any recent trauma or injury affecting left knee.  Past Medical History: Past Medical History:  Diagnosis Date  . Aortic valve stenosis  . Bilateral cataracts  . GERD (gastroesophageal reflux disease)  . Heart disease  . Hemorrhoids  . History of chickenpox  . Hyperlipidemia  . Hyperplastic colon polyp 08/30/2015  . Hypertension  . Macular degeneration 11/2018  . Mitral insufficiency  . Osteoarthritis  . Skin cancer  . Tubular adenoma of colon, unspecified 08/30/2015   Past Surgical History: Past Surgical History:  Procedure Laterality Date  . Bilateral cataracts 2009  . COLONOSCOPY 08/30/2015  Tubular adenoma of colon/Hyperplastic colon polyp/No Repeat/MGR  . Eyelid surgery 2011  . HYSTERECTOMY 1995  . JOINT REPLACEMENT Right 06/01/2012  Right knee lateral MAKOplasty.  . Retina surgery 2010  . TONSILLECTOMY 1944   Past Family History: Family History  Problem Relation Age of Onset  . High blood pressure (Hypertension) Mother  . Stroke Mother  . Breast cancer Sister  . Colon cancer Neg Hx  . Colon polyps Neg Hx  . Inflammatory bowel disease Neg  Hx   Medications: Current Outpatient Medications Ordered in Epic  Medication Sig Dispense Refill  . acetaminophen (TYLENOL) 650 MG ER tablet Take 1,300 mg by mouth 3 (three) times a day  . antiox #8/om3/dha/epa/lut/zeax (PRESERVISION AREDS 2, OMEGA-3, ORAL) Take 1 tablet by mouth 2 (two) times daily  . artificial tear,dxtrn-hpm-gly, (GENTEAL TEARS MODERATE) 0.1-0.3-0.2 % Drop Apply 1 drop to eye nightly  . aspirin 81 MG EC tablet Take 81 mg by mouth once daily  . atorvastatin (LIPITOR) 40 MG tablet Take 1 tablet (40 mg total) by mouth once daily 90 tablet 3  . azelastine (ASTELIN) 137 mcg nasal spray Place 1 spray into both nostrils once daily  . bifidobacterium infantis (ALIGN) 4 mg capsule Take 1 capsule by mouth once daily.  . carvediloL (COREG) 12.5 MG tablet Take 1 tablet (12.5 mg total) by mouth 2 (two) times daily with meals 180 tablet 4  . cetirizine (ZYRTEC) 10 MG tablet Take 10 mg by mouth once daily.  . clopidogreL (PLAVIX) 75 mg tablet Take 1 tablet (75 mg total) by mouth once daily 90 tablet 3  . erythromycin (ROMYCIN) ophthalmic ointment  . fluticasone (FLONASE) 50 mcg/actuation nasal spray Place 1 spray into both nostrils once daily 4  . FUROsemide (LASIX) 20 MG tablet Take 20 mg by mouth once daily. 0  . irbesartan (AVAPRO) 300 MG tablet TAKE 1/2 TABLET BY MOUTH ONCE DAILY 45 tablet 1  . NON FORMULARY Take 1 tablet by mouth once daily ARTHROZENE  . omeprazole (PRILOSEC) 20 MG DR capsule Take  1 capsule by mouth once daily  . peg 400-propylene glycol (SYSTANE ULTRA) 0.4-0.3 % drops Apply 1 drop to eye 3 (three) times daily as needed  . potassium chloride (KLOR-CON) 10 MEQ ER tablet TAKE 1 TABLET BY MOUTH ONCE DAILY 90 tablet 1   No current Epic-ordered facility-administered medications on file.   Allergies: Allergies  Allergen Reactions  . Accupril [Quinapril] Unknown  . Amoxicillin Rash  . Clinoril [Sulindac] Unknown  . Codeine Sulfate Unknown  . Hydrochlorothiazide  Unknown  . Penicillins Unknown  . Spironolactone ([place]) Diarrhea  . Sulfa (Sulfonamide Antibiotics) Unknown  . Tessalon [Benzonatate] Unknown    Review of Systems:  A comprehensive 14 point ROS was performed, reviewed by me today, and the pertinent orthopaedic findings are documented in the HPI.  Exam: BP 134/80 (BP Location: Left upper arm, Patient Position: Sitting, BP Cuff Size: Adult)  Ht 158.8 cm (5' 2.5")  Wt 62.8 kg (138 lb 6.4 oz)  BMI 24.91 kg/m  General/Constitutional: The patient appears to be well-nourished, well-developed, and in no acute distress. Neuro/Psych: Normal mood and affect, oriented to person, place and time. Eyes: Non-icteric. Pupils are equal, round, and reactive to light, and exhibit synchronous movement. ENT: Unremarkable. Lymphatic: No palpable adenopathy. Respiratory: Lungs clear to auscultation, Normal chest excursion, No wheezes and Non-labored breathing Cardiovascular: Regular rate and rhythm. No murmurs. and No edema, swelling or tenderness, except as noted in detailed exam. Integumentary: No impressive skin lesions present, except as noted in detailed exam. Musculoskeletal: Unremarkable, except as noted in detailed exam.  Skin examination of the left knee reveals no open wound, erythema or signs of infection. There is mild soft tissue swelling without effusion. The patient is tender to palpation along the medial and lateral aspect of the left knee with moderate valgus deformity. The patella tracks well without evidence of subluxation. The patient is able to extend 10 degrees and is able to flex 110 degrees. Intact to light touch throughout the left lower extremity. The patient has full strength to the left lower extremity. Cap refills intact to each individual toe. Dorsalis pedis and posterior tibialis pulse are intact.  Imaging: AP, lateral and sunrise views of the left knee in addition to PA flexion views of the left knee were obtained today in the  office and reviewed by me. These x-rays demonstrate moderate narrowing of the lateral compartment with associated valgus alignment. Osteophyte formation noted. Subchondral sclerosis noted. Diffuse osteopenia. No evidence of fracture or dislocation, lytic lesions identified.  Impression: Primary osteoarthritis of left knee [M17.12] Primary osteoarthritis of left knee (primary encounter diagnosis)  Plan:  1. Treatment options were discussed today with the patient. 2. The patient is scheduled to undergo a left total knee arthroplasty with Dr. Marry Guan on 12/20/2019.  3. The patient was instructed to stop her Plavix 1 week prior to surgical intervention. We will reach out to her cardiologist about when to stop her baby aspirin. 4. The patient was instructed on the risk and benefits of surgery and wishes to proceed at this time.  5. This document will serve as the surgical history and physical for the patient. 6. The patient will follow-up per standard postop protocol. They can call the clinic they have any questions, new symptoms develop or symptoms worsen.  The procedure was discussed with the patient, as were the potential risks (including bleeding, infection, nerve and/or blood vessel injury, persistent or recurrent pain, failure of the hardware, knee instability, need for further surgery, blood clots, strokes, heart  attacks and/or arhythmias, pneumonia, etc.) and benefits. The patient states her understanding and wishes to proceed.  This office visit took 45 minutes, of which >50% involved patient counseling/education.  Review of the [place] CSRS was performed in accordance of the [place] prior to dispensing any controlled drugs.  This note was generated in part with voice recognition software and I apologize for any typographical errors that were not detected and corrected.  Teresa Branston Halsted, PA-C [place]   Electronically signed by Teresa Corns, PA at 12/11/2019 12:57 PM  EDT

## 2019-12-20 ENCOUNTER — Inpatient Hospital Stay: Payer: Medicare Other | Admitting: Urgent Care

## 2019-12-20 ENCOUNTER — Inpatient Hospital Stay
Admission: RE | Admit: 2019-12-20 | Discharge: 2019-12-22 | DRG: 470 | Disposition: A | Discharge status: Home Health | Payer: Medicare Other | Attending: Orthopedic Surgery | Admitting: Orthopedic Surgery

## 2019-12-20 ENCOUNTER — Other Ambulatory Visit: Payer: Self-pay

## 2019-12-20 ENCOUNTER — Inpatient Hospital Stay: Payer: Medicare Other

## 2019-12-20 ENCOUNTER — Encounter: Payer: Self-pay | Admitting: Orthopedic Surgery

## 2019-12-20 ENCOUNTER — Encounter
Admission: RE | Disposition: A | Discharge status: Home / Self Care | Payer: Self-pay | Source: Home / Self Care | Attending: Orthopedic Surgery

## 2019-12-20 DIAGNOSIS — Z888 Allergy status to other drugs, medicaments and biological substances status: Secondary | ICD-10-CM

## 2019-12-20 DIAGNOSIS — Z880 Allergy status to penicillin: Secondary | ICD-10-CM

## 2019-12-20 DIAGNOSIS — H3530 Unspecified macular degeneration: Secondary | ICD-10-CM | POA: Diagnosis present

## 2019-12-20 DIAGNOSIS — Z885 Allergy status to narcotic agent status: Secondary | ICD-10-CM | POA: Diagnosis not present

## 2019-12-20 DIAGNOSIS — Z882 Allergy status to sulfonamides status: Secondary | ICD-10-CM

## 2019-12-20 DIAGNOSIS — Z85828 Personal history of other malignant neoplasm of skin: Secondary | ICD-10-CM | POA: Diagnosis not present

## 2019-12-20 DIAGNOSIS — Z9049 Acquired absence of other specified parts of digestive tract: Secondary | ICD-10-CM | POA: Diagnosis not present

## 2019-12-20 DIAGNOSIS — Z90710 Acquired absence of both cervix and uterus: Secondary | ICD-10-CM

## 2019-12-20 DIAGNOSIS — Z96651 Presence of right artificial knee joint: Secondary | ICD-10-CM | POA: Diagnosis present

## 2019-12-20 DIAGNOSIS — Z8249 Family history of ischemic heart disease and other diseases of the circulatory system: Secondary | ICD-10-CM

## 2019-12-20 DIAGNOSIS — I5022 Chronic systolic (congestive) heart failure: Secondary | ICD-10-CM | POA: Diagnosis present

## 2019-12-20 DIAGNOSIS — I110 Hypertensive heart disease with heart failure: Secondary | ICD-10-CM | POA: Diagnosis present

## 2019-12-20 DIAGNOSIS — K219 Gastro-esophageal reflux disease without esophagitis: Secondary | ICD-10-CM | POA: Diagnosis not present

## 2019-12-20 DIAGNOSIS — E785 Hyperlipidemia, unspecified: Secondary | ICD-10-CM | POA: Diagnosis present

## 2019-12-20 DIAGNOSIS — Z79899 Other long term (current) drug therapy: Secondary | ICD-10-CM | POA: Diagnosis not present

## 2019-12-20 DIAGNOSIS — I080 Rheumatic disorders of both mitral and aortic valves: Secondary | ICD-10-CM | POA: Diagnosis present

## 2019-12-20 DIAGNOSIS — I2510 Atherosclerotic heart disease of native coronary artery without angina pectoris: Secondary | ICD-10-CM | POA: Diagnosis present

## 2019-12-20 DIAGNOSIS — Z955 Presence of coronary angioplasty implant and graft: Secondary | ICD-10-CM

## 2019-12-20 DIAGNOSIS — Z881 Allergy status to other antibiotic agents status: Secondary | ICD-10-CM | POA: Diagnosis not present

## 2019-12-20 DIAGNOSIS — Z7982 Long term (current) use of aspirin: Secondary | ICD-10-CM | POA: Diagnosis not present

## 2019-12-20 DIAGNOSIS — M1712 Unilateral primary osteoarthritis, left knee: Secondary | ICD-10-CM | POA: Diagnosis not present

## 2019-12-20 DIAGNOSIS — M25762 Osteophyte, left knee: Secondary | ICD-10-CM | POA: Diagnosis present

## 2019-12-20 DIAGNOSIS — Z96659 Presence of unspecified artificial knee joint: Secondary | ICD-10-CM

## 2019-12-20 DIAGNOSIS — I252 Old myocardial infarction: Secondary | ICD-10-CM

## 2019-12-20 DIAGNOSIS — Z7902 Long term (current) use of antithrombotics/antiplatelets: Secondary | ICD-10-CM

## 2019-12-20 DIAGNOSIS — J302 Other seasonal allergic rhinitis: Secondary | ICD-10-CM | POA: Diagnosis present

## 2019-12-20 DIAGNOSIS — Z20822 Contact with and (suspected) exposure to covid-19: Secondary | ICD-10-CM | POA: Diagnosis present

## 2019-12-20 HISTORY — PX: KNEE ARTHROPLASTY: SHX992

## 2019-12-20 LAB — POCT I-STAT, CHEM 8
BUN: 9 mg/dL (ref 8–23)
Calcium, Ion: 1.23 mmol/L (ref 1.15–1.40)
Chloride: 99 mmol/L (ref 98–111)
Creatinine, Ser: 0.5 mg/dL (ref 0.44–1.00)
Glucose, Bld: 87 mg/dL (ref 70–99)
HCT: 41 % (ref 36.0–46.0)
Hemoglobin: 13.9 g/dL (ref 12.0–15.0)
Potassium: 4.1 mmol/L (ref 3.5–5.1)
Sodium: 135 mmol/L (ref 135–145)
TCO2: 22 mmol/L (ref 22–32)

## 2019-12-20 LAB — ABO/RH: ABO/RH(D): O NEG

## 2019-12-20 SURGERY — ARTHROPLASTY, KNEE, TOTAL, USING IMAGELESS COMPUTER-ASSISTED NAVIGATION
Anesthesia: Spinal | Site: Knee | Laterality: Left

## 2019-12-20 MED ORDER — GABAPENTIN 300 MG PO CAPS: 300.0000 mg | ORAL_CAPSULE | Freq: Once | ORAL | Status: AC

## 2019-12-20 MED ORDER — CEFAZOLIN SODIUM-DEXTROSE 2-4 GM/100ML-% IV SOLN
2.0000 g | INTRAVENOUS | Status: AC
  Administered 2019-12-20: 2 g via INTRAVENOUS

## 2019-12-20 MED ORDER — ALUM & MAG HYDROXIDE-SIMETH 200-200-20 MG/5ML PO SUSP: 30.0000 mL | ORAL | Status: DC | PRN

## 2019-12-20 MED ORDER — LORATADINE 10 MG PO TABS
10.0000 mg | ORAL_TABLET | Freq: Every day | ORAL | Status: DC
  Administered 2019-12-20 – 2019-12-22 (×3): 10 mg via ORAL
  Filled 2019-12-20 (×3): qty 1

## 2019-12-20 MED ORDER — ONDANSETRON HCL 4 MG/2ML IJ SOLN: 4.0000 mg | Freq: Four times a day (QID) | INTRAMUSCULAR | Status: DC | PRN

## 2019-12-20 MED ORDER — ONDANSETRON HCL 4 MG/2ML IJ SOLN
INTRAMUSCULAR | Status: AC
  Filled 2019-12-20: qty 2

## 2019-12-20 MED ORDER — TRAMADOL HCL 50 MG PO TABS
50.0000 mg | ORAL_TABLET | ORAL | Status: DC | PRN
  Administered 2019-12-21 – 2019-12-22 (×3): 100 mg via ORAL
  Filled 2019-12-20 (×3): qty 2

## 2019-12-20 MED ORDER — CEFAZOLIN SODIUM-DEXTROSE 2-4 GM/100ML-% IV SOLN
INTRAVENOUS | Status: AC
  Filled 2019-12-20: qty 100

## 2019-12-20 MED ORDER — FENTANYL CITRATE (PF) 100 MCG/2ML IJ SOLN
INTRAMUSCULAR | Status: AC
  Filled 2019-12-20: qty 2

## 2019-12-20 MED ORDER — ONDANSETRON HCL 4 MG/2ML IJ SOLN
INTRAMUSCULAR | Status: DC | PRN
  Administered 2019-12-20: 4 mg via INTRAVENOUS

## 2019-12-20 MED ORDER — ENOXAPARIN SODIUM 30 MG/0.3ML ~~LOC~~ SOLN
30.0000 mg | Freq: Two times a day (BID) | SUBCUTANEOUS | Status: DC
  Administered 2019-12-21 – 2019-12-22 (×3): 30 mg via SUBCUTANEOUS
  Filled 2019-12-20 (×3): qty 0.3

## 2019-12-20 MED ORDER — CELECOXIB 200 MG PO CAPS: 400.0000 mg | ORAL_CAPSULE | Freq: Once | ORAL | Status: AC

## 2019-12-20 MED ORDER — IRBESARTAN 150 MG PO TABS
150.0000 mg | ORAL_TABLET | ORAL | Status: DC
  Filled 2019-12-20: qty 1

## 2019-12-20 MED ORDER — LACTATED RINGERS IV SOLN: INTRAVENOUS | Status: DC

## 2019-12-20 MED ORDER — FUROSEMIDE 20 MG PO TABS
20.0000 mg | ORAL_TABLET | ORAL | Status: DC
  Administered 2019-12-21 – 2019-12-22 (×2): 20 mg via ORAL
  Filled 2019-12-20 (×2): qty 1

## 2019-12-20 MED ORDER — AZELASTINE HCL 0.1 % NA SOLN
1.0000 | Freq: Every day | NASAL | Status: DC
  Administered 2019-12-20 – 2019-12-21 (×2): 1 via NASAL
  Filled 2019-12-20: qty 30

## 2019-12-20 MED ORDER — GABAPENTIN 300 MG PO CAPS
300.0000 mg | ORAL_CAPSULE | Freq: Every day | ORAL | Status: DC
  Administered 2019-12-20 – 2019-12-21 (×2): 300 mg via ORAL
  Filled 2019-12-20: qty 1
  Filled 2019-12-20: qty 3

## 2019-12-20 MED ORDER — PROPOFOL 500 MG/50ML IV EMUL
INTRAVENOUS | Status: AC
  Filled 2019-12-20: qty 50

## 2019-12-20 MED ORDER — DIPHENHYDRAMINE HCL 12.5 MG/5ML PO ELIX: 12.5000 mg | ORAL_SOLUTION | ORAL | Status: DC | PRN

## 2019-12-20 MED ORDER — SODIUM CHLORIDE 0.9 % IV SOLN: INTRAVENOUS | Status: DC

## 2019-12-20 MED ORDER — FLUTICASONE PROPIONATE 50 MCG/ACT NA SUSP
1.0000 | Freq: Every day | NASAL | Status: DC
  Administered 2019-12-21 – 2019-12-22 (×2): 1 via NASAL
  Filled 2019-12-20: qty 16

## 2019-12-20 MED ORDER — RISAQUAD PO CAPS
1.0000 | ORAL_CAPSULE | Freq: Every day | ORAL | Status: DC
  Administered 2019-12-21: 1 via ORAL
  Filled 2019-12-20 (×3): qty 1

## 2019-12-20 MED ORDER — OCUVITE-LUTEIN PO CAPS
1.0000 | ORAL_CAPSULE | Freq: Two times a day (BID) | ORAL | Status: DC
  Administered 2019-12-20 – 2019-12-22 (×4): 1 via ORAL
  Filled 2019-12-20 (×6): qty 1

## 2019-12-20 MED ORDER — NEOMYCIN-POLYMYXIN B GU 40-200000 IR SOLN
Status: DC | PRN
  Administered 2019-12-20: 16 mL

## 2019-12-20 MED ORDER — BUPIVACAINE HCL (PF) 0.25 % IJ SOLN
INTRAMUSCULAR | Status: DC | PRN
  Administered 2019-12-20: 60 mL

## 2019-12-20 MED ORDER — FENTANYL CITRATE (PF) 100 MCG/2ML IJ SOLN
INTRAMUSCULAR | Status: DC | PRN
  Administered 2019-12-20 (×2): 50 ug via INTRAVENOUS

## 2019-12-20 MED ORDER — CHLORHEXIDINE GLUCONATE 0.12 % MT SOLN: 15.0000 mL | Freq: Once | OROMUCOSAL | Status: AC

## 2019-12-20 MED ORDER — POTASSIUM CHLORIDE CRYS ER 10 MEQ PO TBCR
10.0000 meq | EXTENDED_RELEASE_TABLET | Freq: Every day | ORAL | Status: DC
  Administered 2019-12-20 – 2019-12-22 (×3): 10 meq via ORAL
  Filled 2019-12-20 (×3): qty 1

## 2019-12-20 MED ORDER — BUPIVACAINE HCL (PF) 0.5 % IJ SOLN
INTRAMUSCULAR | Status: DC | PRN
  Administered 2019-12-20: 15 mL via INTRATHECAL

## 2019-12-20 MED ORDER — SENNOSIDES-DOCUSATE SODIUM 8.6-50 MG PO TABS
1.0000 | ORAL_TABLET | Freq: Two times a day (BID) | ORAL | Status: DC
  Administered 2019-12-20 – 2019-12-21 (×3): 1 via ORAL
  Filled 2019-12-20 (×3): qty 1

## 2019-12-20 MED ORDER — OXYCODONE HCL 5 MG PO TABS: 5.0000 mg | ORAL_TABLET | ORAL | Status: DC | PRN

## 2019-12-20 MED ORDER — GABAPENTIN 300 MG PO CAPS
ORAL_CAPSULE | ORAL | Status: AC
  Administered 2019-12-20: 300 mg via ORAL
  Filled 2019-12-20: qty 1

## 2019-12-20 MED ORDER — PHENOL 1.4 % MT LIQD
1.0000 | OROMUCOSAL | Status: DC | PRN
  Filled 2019-12-20: qty 177

## 2019-12-20 MED ORDER — ACETAMINOPHEN 10 MG/ML IV SOLN
INTRAVENOUS | Status: DC | PRN
  Administered 2019-12-20: 1000 mg via INTRAVENOUS

## 2019-12-20 MED ORDER — ATORVASTATIN CALCIUM 20 MG PO TABS
40.0000 mg | ORAL_TABLET | Freq: Every day | ORAL | Status: DC
  Administered 2019-12-21: 40 mg via ORAL
  Filled 2019-12-20: qty 2

## 2019-12-20 MED ORDER — CEFAZOLIN SODIUM-DEXTROSE 2-4 GM/100ML-% IV SOLN
2.0000 g | Freq: Four times a day (QID) | INTRAVENOUS | Status: AC
  Administered 2019-12-20 – 2019-12-21 (×4): 2 g via INTRAVENOUS
  Filled 2019-12-20 (×4): qty 100

## 2019-12-20 MED ORDER — ARTIFICIAL TEARS OPHTHALMIC OINT
1.0000 "application " | TOPICAL_OINTMENT | Freq: Every day | OPHTHALMIC | Status: DC
  Administered 2019-12-20 – 2019-12-21 (×2): 1 via OPHTHALMIC
  Filled 2019-12-20: qty 1

## 2019-12-20 MED ORDER — CHLORHEXIDINE GLUCONATE 4 % EX LIQD
60.0000 mL | Freq: Once | CUTANEOUS | Status: AC
  Administered 2019-12-20: 4 via TOPICAL

## 2019-12-20 MED ORDER — ACETAMINOPHEN 10 MG/ML IV SOLN
INTRAVENOUS | Status: AC
  Filled 2019-12-20: qty 100

## 2019-12-20 MED ORDER — ORAL CARE MOUTH RINSE: 15.0000 mL | Freq: Once | OROMUCOSAL | Status: AC

## 2019-12-20 MED ORDER — DEXAMETHASONE SODIUM PHOSPHATE 10 MG/ML IJ SOLN
INTRAMUSCULAR | Status: AC
  Administered 2019-12-20: 8 mg via INTRAVENOUS
  Filled 2019-12-20: qty 1

## 2019-12-20 MED ORDER — CLOPIDOGREL BISULFATE 75 MG PO TABS
75.0000 mg | ORAL_TABLET | Freq: Every day | ORAL | Status: DC
  Administered 2019-12-21: 75 mg via ORAL
  Filled 2019-12-20: qty 1

## 2019-12-20 MED ORDER — FERROUS SULFATE 325 (65 FE) MG PO TABS
325.0000 mg | ORAL_TABLET | Freq: Two times a day (BID) | ORAL | Status: DC
  Administered 2019-12-21 – 2019-12-22 (×3): 325 mg via ORAL
  Filled 2019-12-20 (×3): qty 1

## 2019-12-20 MED ORDER — METOCLOPRAMIDE HCL 5 MG/ML IJ SOLN: 5.0000 mg | Freq: Three times a day (TID) | INTRAMUSCULAR | Status: DC | PRN

## 2019-12-20 MED ORDER — ENSURE PRE-SURGERY PO LIQD
296.0000 mL | Freq: Once | ORAL | Status: AC
  Administered 2019-12-20: 296 mL via ORAL
  Filled 2019-12-20: qty 296

## 2019-12-20 MED ORDER — ACETAMINOPHEN 10 MG/ML IV SOLN
1000.0000 mg | Freq: Four times a day (QID) | INTRAVENOUS | Status: AC
  Administered 2019-12-20 – 2019-12-21 (×4): 1000 mg via INTRAVENOUS
  Filled 2019-12-20 (×4): qty 100

## 2019-12-20 MED ORDER — METOCLOPRAMIDE HCL 10 MG PO TABS: 5.0000 mg | ORAL_TABLET | Freq: Three times a day (TID) | ORAL | Status: DC | PRN

## 2019-12-20 MED ORDER — CELECOXIB 200 MG PO CAPS
ORAL_CAPSULE | ORAL | Status: AC
  Administered 2019-12-20: 400 mg via ORAL
  Filled 2019-12-20: qty 2

## 2019-12-20 MED ORDER — OXYCODONE HCL 5 MG PO TABS: 10.0000 mg | ORAL_TABLET | ORAL | Status: DC | PRN

## 2019-12-20 MED ORDER — BISACODYL 10 MG RE SUPP: 10.0000 mg | Freq: Every day | RECTAL | Status: DC | PRN

## 2019-12-20 MED ORDER — DEXAMETHASONE SODIUM PHOSPHATE 10 MG/ML IJ SOLN: 8.0000 mg | Freq: Once | INTRAMUSCULAR | Status: AC

## 2019-12-20 MED ORDER — POLYVINYL ALCOHOL 1.4 % OP SOLN
1.0000 [drp] | Freq: Two times a day (BID) | OPHTHALMIC | Status: DC
  Administered 2019-12-20 – 2019-12-22 (×4): 1 [drp] via OPHTHALMIC
  Filled 2019-12-20: qty 15

## 2019-12-20 MED ORDER — HYDROMORPHONE HCL 1 MG/ML IJ SOLN: 0.5000 mg | INTRAMUSCULAR | Status: DC | PRN

## 2019-12-20 MED ORDER — SODIUM CHLORIDE 0.9 % IV SOLN
INTRAVENOUS | Status: DC | PRN
  Administered 2019-12-20: 60 mL

## 2019-12-20 MED ORDER — MAGNESIUM HYDROXIDE 400 MG/5ML PO SUSP
30.0000 mL | Freq: Every day | ORAL | Status: DC
  Administered 2019-12-20 – 2019-12-21 (×2): 30 mL via ORAL
  Filled 2019-12-20 (×2): qty 30

## 2019-12-20 MED ORDER — ONDANSETRON HCL 4 MG PO TABS: 4.0000 mg | ORAL_TABLET | Freq: Four times a day (QID) | ORAL | Status: DC | PRN

## 2019-12-20 MED ORDER — FENTANYL CITRATE (PF) 100 MCG/2ML IJ SOLN: 25.0000 ug | INTRAMUSCULAR | Status: DC | PRN

## 2019-12-20 MED ORDER — PROPOFOL 500 MG/50ML IV EMUL
INTRAVENOUS | Status: DC | PRN
  Administered 2019-12-20: 40 ug/kg/min via INTRAVENOUS

## 2019-12-20 MED ORDER — CARVEDILOL 12.5 MG PO TABS
12.5000 mg | ORAL_TABLET | Freq: Two times a day (BID) | ORAL | Status: DC
  Administered 2019-12-21 – 2019-12-22 (×2): 12.5 mg via ORAL
  Filled 2019-12-20 (×2): qty 1

## 2019-12-20 MED ORDER — PANTOPRAZOLE SODIUM 40 MG PO TBEC
40.0000 mg | DELAYED_RELEASE_TABLET | Freq: Two times a day (BID) | ORAL | Status: DC
  Administered 2019-12-20 – 2019-12-22 (×4): 40 mg via ORAL
  Filled 2019-12-20 (×4): qty 1

## 2019-12-20 MED ORDER — ACETAMINOPHEN 325 MG PO TABS
325.0000 mg | ORAL_TABLET | Freq: Four times a day (QID) | ORAL | Status: DC | PRN
  Administered 2019-12-22: 650 mg via ORAL
  Filled 2019-12-20: qty 2

## 2019-12-20 MED ORDER — METOCLOPRAMIDE HCL 10 MG PO TABS
10.0000 mg | ORAL_TABLET | Freq: Three times a day (TID) | ORAL | Status: DC
  Administered 2019-12-20 – 2019-12-22 (×7): 10 mg via ORAL
  Filled 2019-12-20 (×7): qty 1

## 2019-12-20 MED ORDER — MENTHOL 3 MG MT LOZG
1.0000 | LOZENGE | OROMUCOSAL | Status: DC | PRN
  Filled 2019-12-20: qty 9

## 2019-12-20 MED ORDER — CHLORHEXIDINE GLUCONATE 0.12 % MT SOLN
OROMUCOSAL | Status: AC
  Administered 2019-12-20: 15 mL via OROMUCOSAL
  Filled 2019-12-20: qty 15

## 2019-12-20 MED ORDER — FLEET ENEMA 7-19 GM/118ML RE ENEM: 1.0000 | ENEMA | Freq: Once | RECTAL | Status: DC | PRN

## 2019-12-20 MED ORDER — CELECOXIB 200 MG PO CAPS
200.0000 mg | ORAL_CAPSULE | Freq: Two times a day (BID) | ORAL | Status: DC
  Administered 2019-12-20 – 2019-12-22 (×4): 200 mg via ORAL
  Filled 2019-12-20 (×4): qty 1

## 2019-12-20 SURGICAL SUPPLY — 73 items
ATTUNE PSFEM LTSZ3 NARCEM KNEE (Femur) ×2 IMPLANT
ATTUNE PSRP INSR SZ3 5 KNEE (Insert) ×1 IMPLANT
ATTUNE PSRP INSR SZ3 5MM KNEE (Insert) ×1 IMPLANT
BASE TIBIAL ROT PLAT SZ 2 KNEE (Miscellaneous) IMPLANT
BATTERY INSTRU NAVIGATION (MISCELLANEOUS) ×12 IMPLANT
BLADE SAW 70X12.5 (BLADE) ×3 IMPLANT
BLADE SAW 90X13X1.19 OSCILLAT (BLADE) ×3 IMPLANT
BLADE SAW 90X25X1.19 OSCILLAT (BLADE) ×3 IMPLANT
BONE CEMENT GENTAMICIN (Cement) ×6 IMPLANT
CANISTER SUCT 3000ML PPV (MISCELLANEOUS) ×3 IMPLANT
CEMENT BONE GENTAMICIN 40 (Cement) IMPLANT
COOLER POLAR GLACIER W/PUMP (MISCELLANEOUS) ×3 IMPLANT
COVER WAND RF STERILE (DRAPES) ×3 IMPLANT
CUFF TOURN SGL QUICK 24 (TOURNIQUET CUFF) ×2
CUFF TRNQT CYL 24X4X16.5-23 (TOURNIQUET CUFF) IMPLANT
DRAPE 3/4 80X56 (DRAPES) ×3 IMPLANT
DRSG DERMACEA 8X12 NADH (GAUZE/BANDAGES/DRESSINGS) ×2 IMPLANT
DRSG OPSITE POSTOP 4X14 (GAUZE/BANDAGES/DRESSINGS) ×3 IMPLANT
DRSG TEGADERM 4X4.75 (GAUZE/BANDAGES/DRESSINGS) ×3 IMPLANT
DURAPREP 26ML APPLICATOR (WOUND CARE) ×6 IMPLANT
ELECT REM PT RETURN 9FT ADLT (ELECTROSURGICAL) ×3
ELECTRODE REM PT RTRN 9FT ADLT (ELECTROSURGICAL) ×1 IMPLANT
EX-PIN ORTHOLOCK NAV 4X150 (PIN) ×6 IMPLANT
GLOVE BIO SURGEON STRL SZ7.5 (GLOVE) ×6 IMPLANT
GLOVE BIOGEL M STRL SZ7.5 (GLOVE) ×6 IMPLANT
GLOVE BIOGEL PI IND STRL 7.5 (GLOVE) ×1 IMPLANT
GLOVE BIOGEL PI INDICATOR 7.5 (GLOVE) ×2
GLOVE INDICATOR 8.0 STRL GRN (GLOVE) ×3 IMPLANT
GOWN STRL REUS W/ TWL LRG LVL3 (GOWN DISPOSABLE) ×2 IMPLANT
GOWN STRL REUS W/ TWL XL LVL3 (GOWN DISPOSABLE) ×1 IMPLANT
GOWN STRL REUS W/TWL LRG LVL3 (GOWN DISPOSABLE) ×4
GOWN STRL REUS W/TWL XL LVL3 (GOWN DISPOSABLE) ×2
HEMOVAC 400CC 10FR (MISCELLANEOUS) ×3 IMPLANT
HOLDER FOLEY CATH W/STRAP (MISCELLANEOUS) ×3 IMPLANT
HOOD PEEL AWAY FLYTE STAYCOOL (MISCELLANEOUS) ×6 IMPLANT
KIT TURNOVER KIT A (KITS) ×3 IMPLANT
KNIFE SCULPS 14X20 (INSTRUMENTS) ×3 IMPLANT
LABEL OR SOLS (LABEL) ×3 IMPLANT
MANIFOLD NEPTUNE II (INSTRUMENTS) ×3 IMPLANT
NDL SAFETY ECLIPSE 18X1.5 (NEEDLE) ×1 IMPLANT
NDL SPNL 20GX3.5 QUINCKE YW (NEEDLE) ×2 IMPLANT
NEEDLE HYPO 18GX1.5 SHARP (NEEDLE) ×2
NEEDLE SPNL 20GX3.5 QUINCKE YW (NEEDLE) ×6 IMPLANT
NS IRRIG 500ML POUR BTL (IV SOLUTION) ×3 IMPLANT
PACK TOTAL KNEE (MISCELLANEOUS) ×3 IMPLANT
PAD WRAPON POLAR KNEE (MISCELLANEOUS) ×1 IMPLANT
PATELLA MEDIAL ATTUN 35MM KNEE (Knees) ×2 IMPLANT
PENCIL SMOKE EVACUATOR COATED (MISCELLANEOUS) ×3 IMPLANT
PENCIL SMOKE ULTRAEVAC 22 CON (MISCELLANEOUS) ×3 IMPLANT
PIN DRILL QUICK PACK ×3 IMPLANT
PIN FIXATION 1/8DIA X 3INL (PIN) ×9 IMPLANT
PULSAVAC PLUS IRRIG FAN TIP (DISPOSABLE) ×3
SOL .9 NS 3000ML IRR  AL (IV SOLUTION) ×2
SOL .9 NS 3000ML IRR UROMATIC (IV SOLUTION) ×1 IMPLANT
SOL PREP PVP 2OZ (MISCELLANEOUS) ×3
SOLUTION PREP PVP 2OZ (MISCELLANEOUS) ×1 IMPLANT
SPONGE DRAIN TRACH 4X4 STRL 2S (GAUZE/BANDAGES/DRESSINGS) ×3 IMPLANT
STAPLER SKIN PROX 35W (STAPLE) ×3 IMPLANT
STOCKINETTE IMPERV 14X48 (MISCELLANEOUS) ×2 IMPLANT
STRAP TIBIA SHORT (MISCELLANEOUS) ×3 IMPLANT
SUCTION FRAZIER HANDLE 10FR (MISCELLANEOUS) ×4
SUCTION TUBE FRAZIER 10FR DISP (MISCELLANEOUS) IMPLANT
SUT VIC AB 0 CT1 36 (SUTURE) ×6 IMPLANT
SUT VIC AB 1 CT1 36 (SUTURE) ×6 IMPLANT
SUT VIC AB 2-0 CT2 27 (SUTURE) ×3 IMPLANT
SYR 20ML LL LF (SYRINGE) ×3 IMPLANT
SYR 30ML LL (SYRINGE) ×6 IMPLANT
TIBIAL BASE ROT PLAT SZ 2 KNEE (Miscellaneous) ×3 IMPLANT
TIP FAN IRRIG PULSAVAC PLUS (DISPOSABLE) ×1 IMPLANT
TOWEL OR 17X26 4PK STRL BLUE (TOWEL DISPOSABLE) ×3 IMPLANT
TOWER CARTRIDGE SMART MIX (DISPOSABLE) ×3 IMPLANT
TRAY FOLEY MTR SLVR 16FR STAT (SET/KITS/TRAYS/PACK) ×3 IMPLANT
WRAPON POLAR PAD KNEE (MISCELLANEOUS) ×3

## 2019-12-20 NOTE — H&P (Signed)
The patient has been re-examined, and the chart reviewed, and there have been no interval changes to the documented history and physical.    The risks, benefits, and alternatives have been discussed at length. The patient expressed understanding of the risks benefits and agreed with plans for surgical intervention.  Jahmiyah Dullea P. Wilfrido Luedke, Jr. M.D.    
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
## 2019-12-20 NOTE — Transfer of Care (Signed)
Immediate Anesthesia Transfer of Care Note  Patient: Teresa Moyer  Procedure(s) Performed: Procedure(s): COMPUTER ASSISTED TOTAL KNEE ARTHROPLASTY (Left)  Patient Location: PACU  Anesthesia Type:General  Level of Consciousness: sedated  Airway & Oxygen Therapy: Patient Spontanous Breathing and Patient connected to face mask oxygen  Post-op Assessment: Report given to RN and Post -op Vital signs reviewed and stable  Post vital signs: Reviewed and stable  Last Vitals:  Vitals:   12/20/19 1039 12/20/19 1635  BP: (!) 187/98 (!) 147/70  Pulse: 81 65  Resp: 18 14  Temp: (!) 36.3 C (P) 36.7 C  SpO2: 32% 44%    Complications: No apparent anesthesia complications

## 2019-12-20 NOTE — Op Note (Signed)
OPERATIVE NOTE  DATE OF SURGERY:  12/20/2019  PATIENT NAME:  KAMALA KOLTON   DOB: 1935-11-08  MRN: [id number]  PRE-OPERATIVE DIAGNOSIS: Degenerative arthrosis of the left knee, primary  POST-OPERATIVE DIAGNOSIS:  Same  PROCEDURE:  Left total knee arthroplasty using computer-assisted navigation  SURGEON:  Marciano Sequin. M.D.  ASSISTANT: Cassell Smiles, PA-C (present and scrubbed throughout the case, critical for assistance with exposure, retraction, instrumentation, and closure)  ANESTHESIA: spinal  ESTIMATED BLOOD LOSS: 50 mL  FLUIDS REPLACED: 1500 mL of crystalloid  TOURNIQUET TIME: 133 minutes  DRAINS: 2 medium Hemovac drains  SOFT TISSUE RELEASES: Anterior cruciate ligament, posterior cruciate ligament, deep medial collateral ligament, patellofemoral ligament, and posterolateral corner  IMPLANTS UTILIZED: DePuy Attune size 3N posterior stabilized femoral component (cemented), size 2 rotating platform tibial component (cemented), 35 mm medialized dome patella (cemented), and a 5 mm stabilized rotating platform polyethylene insert.  INDICATIONS FOR SURGERY: MERCI WALTHERS is a 84 y.o. year old female with a long history of progressive knee pain. X-rays demonstrated severe degenerative changes in tricompartmental fashion. The patient had not seen any significant improvement despite conservative nonsurgical intervention. After discussion of the risks and benefits of surgical intervention, the patient expressed understanding of the risks benefits and agree with plans for total knee arthroplasty.   The risks, benefits, and alternatives were discussed at length including but not limited to the risks of infection, bleeding, nerve injury, stiffness, blood clots, the need for revision surgery, cardiopulmonary complications, among others, and they were willing to proceed.  PROCEDURE IN DETAIL: The patient was brought into the operating room and, after adequate spinal anesthesia was  achieved, a tourniquet was placed on the patient's upper thigh. The patient's knee and leg were cleaned and prepped with alcohol and DuraPrep and draped in the usual sterile fashion. A "timeout" was performed as per usual protocol. The lower extremity was exsanguinated using an Esmarch, and the tourniquet was inflated to 300 mmHg. An anterior longitudinal incision was made followed by a standard mid vastus approach. The deep fibers of the medial collateral ligament were elevated in a subperiosteal fashion off of the medial flare of the tibia so as to maintain a continuous soft tissue sleeve. The patella was subluxed laterally and the patellofemoral ligament was incised. Inspection of the knee demonstrated severe degenerative changes with full-thickness loss of articular cartilage. Osteophytes were debrided using a rongeur. Anterior and posterior cruciate ligaments were excised. Two 4.0 mm Schanz pins were inserted in the femur and into the tibia for attachment of the array of trackers used for computer-assisted navigation. Hip center was identified using a circumduction technique. Distal landmarks were mapped using the computer. The distal femur and proximal tibia were mapped using the computer. The distal femoral cutting guide was positioned using computer-assisted navigation so as to achieve a 5 distal valgus cut. The femur was sized and it was felt that a size 3N femoral component was appropriate. A size 3 femoral cutting guide was positioned and the anterior cut was performed and verified using the computer. This was followed by completion of the posterior and chamfer cuts. Femoral cutting guide for the central box was then positioned in the center box cut was performed.  Attention was then directed to the proximal tibia. Medial and lateral menisci were excised. The extramedullary tibial cutting guide was positioned using computer-assisted navigation so as to achieve a 0 varus-valgus alignment and 3  posterior slope. The cut was performed and verified using the computer. The  proximal tibia was sized and it was felt that a size 2 tibial tray was appropriate. Tibial and femoral trials were inserted followed by insertion of a 5 mm polyethylene insert.  The knee was extremely tight laterally and in extension.   The knee was felt to be tight laterally.  The trial components were removed and the knee was brought into full extension and distracted using the [place]land retractors.  The posterolateral corner was carefully released using a combination of electrocautery and Metzenbaum scissors.  The knee was still tight in extension although good balance was noted in flexion.  It was elected to resect additional bone from the distal femur.  Distal femur was recut and verified using the computer.  This was followed by recutting of the anterior and posterior chamfers and recutting of the central box.  Trial components were reinserted followed by placement of a 5 mm polyethylene trial.  This allowed for excellent mediolateral soft tissue balancing both in flexion and in full extension. Finally, the patella was cut and prepared so as to accommodate a 35 mm medialized dome patella. A patella trial was placed and the knee was placed through a range of motion with excellent patellar tracking appreciated. The femoral trial was removed after debridement of posterior osteophytes. The central post-hole for the tibial component was reamed followed by insertion of a keel punch. Tibial trials were then removed. Cut surfaces of bone were irrigated with copious amounts of normal saline with antibiotic solution using pulsatile lavage and then suctioned dry. Polymethylmethacrylate cement with gentamicin was prepared in the usual fashion using a vacuum mixer. Cement was applied to the cut surface of the proximal tibia as well as along the undersurface of a size 2 rotating platform tibial component. Tibial component was positioned and impacted  into place. Excess cement was removed using [employer] fast streamer. Cement was then applied to the cut surfaces of the femur as well as along the posterior flanges of the size 3N femoral component. The femoral component was positioned and impacted into place. Excess cement was removed using [employer] fast streamer. A 5 mm polyethylene trial was inserted and the knee was brought into full extension with steady axial compression applied. Finally, cement was applied to the backside of a 35 mm medialized dome patella and the patellar component was positioned and patellar clamp applied. Excess cement was removed using [employer] fast streamer. After adequate curing of the cement, the tourniquet was deflated after a total tourniquet time of 133 minutes. Hemostasis was achieved using electrocautery. The knee was irrigated with copious amounts of normal saline with antibiotic solution using pulsatile lavage and then suctioned dry. 20 mL of 1.3% Exparel and 60 mL of 0.25% Marcaine in 40 mL of normal saline was injected along the posterior capsule, medial and lateral gutters, and along the arthrotomy site. A 5 mm stabilized rotating platform polyethylene insert was inserted and the knee was placed through a range of motion with excellent mediolateral soft tissue balancing appreciated and excellent patellar tracking noted. 2 medium drains were placed in the wound bed and brought out through separate stab incisions. The medial parapatellar portion of the incision was reapproximated using interrupted sutures of #1 Vicryl. Subcutaneous tissue was approximated in layers using first #0 Vicryl followed #2-0 Vicryl. The skin was approximated with skin staples. A sterile dressing was applied.  The patient tolerated the procedure well and was transported to the recovery room in stable condition.    Nashonda Limberg P. Holley Bouche., M.D.

## 2019-12-20 NOTE — Anesthesia Procedure Notes (Signed)
Spinal  Patient location during procedure: OR Staffing Performed: anesthesiologist  Anesthesiologist: Piscitello, Precious Haws, MD Preanesthetic Checklist Completed: patient identified, IV checked, site marked, risks and benefits discussed, surgical consent, monitors and equipment checked, pre-op evaluation and timeout performed Spinal Block Patient position: sitting Prep: DuraPrep and site prepped and draped Patient monitoring: heart rate, cardiac monitor, continuous pulse ox and blood pressure Approach: midline Location: L4-5 Injection technique: single-shot Needle Needle type: Pencan  Needle gauge: 24 G Needle length: 10 cm Needle insertion depth: 5 cm Assessment Sensory level: T4

## 2019-12-21 ENCOUNTER — Encounter: Payer: Self-pay | Admitting: Orthopedic Surgery

## 2019-12-21 MED ORDER — IRBESARTAN 150 MG PO TABS
75.0000 mg | ORAL_TABLET | ORAL | Status: DC
  Administered 2019-12-22: 75 mg via ORAL
  Filled 2019-12-21: qty 1

## 2019-12-21 NOTE — TOC Progression Note (Signed)
Transition of Care [facility]) - Progression Note    Patient Details  Name: Teresa Moyer MRN: [id number] Date of Birth: 08-Apr-1936  Transition of Care [facility]) CM/SW [place], RN Phone Number: 12/21/2019, 4:04 PM  Clinical Narrative:    Spoke with the patient about DC plan and needs She lives at home with her husband and has her sister to help as well, She has all the DMe she needs at home, she is set up with [facility] services, She has a medication plan to hekp with meds, I explained the [facility] app if the Lovenox is too expensive and provided the prices from [facility] and the pharmacies that they are for, she stated understanding, she has transportation with her husband and she is up to date with her PCP, no additional needs at this time   Expected Discharge Plan: [place] Barriers to Discharge: Continued Medical Work up  Expected Discharge Plan and Services Expected Discharge Plan: [place] arrangements for the past 2 months: Single Family Home                 DME Arranged: N/A         HH Arranged: PT, OT HH Agency: [facility] (formerly [employer]) Date [place]: 12/21/19 Time [place]: 1603 Representative spoke with at [place]: [place] ([place]) Interventions    Readmission Risk Interventions No flowsheet data found.

## 2019-12-21 NOTE — Progress Notes (Signed)
Physical Therapy Treatment Patient Details Name: Teresa Moyer MRN: [id number] DOB: 1935-09-26 Today's Date: 12/21/2019    History of Present Illness Pt is an 84 y.o. female s/p L TKA secondary to degenerative arthrosis 12/20/19.  PMH includes aortic valve stenosis, htn, mitral insufficiency, OA, skin CA, R TKR 2013, CHF, h/o MI.    PT Comments    Pt was seated in recliner upon arriving. She reports feeling well with HR/sao2 stable. Pt requested to return to bed at conclusion of ambulation/PT session. She stood from recliner to  [place] with CGA. Has slight posterior lean but with vcs was able to correct. She ambulated 100 ft without LOB or unsteadiness. Overall pt is progressing well towards all PT goals. Will perform stair training in the morning session to simulate home environment. Pt did perform there ex/HEP once returned to room and repositioned in bed. Acute PT will continue to follow and progress per POC. She was in bed with bed alarm in place, bone foam under heel, and polar care in place at conclusion of PT session.      Follow Up Recommendations  Home health PT;Supervision for mobility/OOB     Equipment Recommendations  None recommended by PT (pt reports having equipment needed at home already)    Recommendations for Other Services       Precautions / Restrictions Precautions Precautions: Fall Precaution Booklet Issued: Yes (comment) Restrictions Weight Bearing Restrictions: Yes LLE Weight Bearing: Weight bearing as tolerated    Mobility  Bed Mobility Overal bed mobility: Needs Assistance Bed Mobility: Sit to Supine       Sit to supine: Supervision   General bed mobility comments: Increased time to perform with use of opposite LE/UE to assist operative LE into bed. Required vcs/tactile cues for technique and sequencing improvements however no actual physical assistance required.  Transfers Overall transfer level: Needs assistance Equipment used: Rolling walker (2  wheeled) Transfers: Sit to/from Stand Sit to Stand: Min guard         General transfer comment: CGA for safety only. Pt demonstrated good technique and safety.  Ambulation/Gait Ambulation/Gait assistance: Min guard Gait Distance (Feet): 100 Feet Assistive device: Rolling walker (2 wheeled) Gait Pattern/deviations: Step-through pattern;Trunk flexed;Antalgic Gait velocity: decreased   General Gait Details: Pt was able to ambulate 100 ft with RW + gait belt. ambulation started with step to pattern however pt was able to progress to step through. Vcs throughout for posture correction. Overall tolerated well with HR/O2 stable throughout.   Stairs             Wheelchair Mobility    Modified Rankin (Stroke Patients Only)       Balance Overall balance assessment: Needs assistance Sitting-balance support: No upper extremity supported;Feet supported Sitting balance-Leahy Scale: Good Sitting balance - Comments: steady dynamic sitting reaching within BOS   Standing balance support: Bilateral upper extremity supported;During functional activity Standing balance-Leahy Scale: Good Standing balance comment: Initially has slight posterior push in standing but was able to progress to from min assist to CGA. By the end of session required CGA only for safety with pt using BUE support on RW                            Cognition Arousal/Alertness: Awake/alert Behavior During Therapy: [facility] for tasks assessed/performed Overall Cognitive Status: Within Functional Limits for tasks assessed  General Comments: Pt was A and O x 4 and agreeable and pleasant throughout. Very motivated to improve.      Exercises Total Joint Exercises Ankle Circles/Pumps: AROM;Strengthening;Both;10 reps;Supine Quad Sets: AROM;Strengthening;Both;10 reps;Supine Short Arc Quad: AROM;Strengthening;Left;10 reps;Supine Heel Slides: AAROM;Strengthening;Left;10  reps;Supine Hip ABduction/ADduction: AAROM;Strengthening;Left;10 reps;Supine Straight Leg Raises: AAROM;Strengthening;Left;10 reps;Supine    General Comments        Pertinent Vitals/Pain Pain Assessment: 0-10 Pain Score: 3  Pain Location: L knee Pain Descriptors / Indicators: Discomfort Pain Intervention(s): Limited activity within patient's tolerance;Monitored during session;Premedicated before session;Ice applied    Home Living                      Prior Function            PT Goals (current goals can now be found in the care plan section) Acute Rehab PT Goals Patient Stated Goal: " To get better so I can go home." Progress towards PT goals: Progressing toward goals    Frequency    BID      PT Plan Current plan remains appropriate    Co-evaluation              AM-PAC PT "6 Clicks" Mobility   Outcome Measure  Help needed turning from your back to your side while in a flat bed without using bedrails?: None Help needed moving from lying on your back to sitting on the side of a flat bed without using bedrails?: A Little Help needed moving to and from a bed to a chair (including a wheelchair)?: A Little Help needed standing up from a chair using your arms (e.g., wheelchair or bedside chair)?: A Little Help needed to walk in hospital room?: A Little Help needed climbing 3-5 steps with a railing? : A Little 6 Click Score: 19    End of Session Equipment Utilized During Treatment: Gait belt Activity Tolerance: Patient tolerated treatment well Patient left: in bed;with call bell/phone within reach;with bed alarm set;with family/visitor present;with SCD's reapplied Nurse Communication: Mobility status;Precautions;Weight bearing status;Other (comment) PT Visit Diagnosis: Other abnormalities of gait and mobility (R26.89);Muscle weakness (generalized) (M62.81);Difficulty in walking, not elsewhere classified (R26.2);Pain Pain - Right/Left: Left Pain - part of  body: Knee     Time: 1340-1400 PT Time Calculation (min) (ACUTE ONLY): 20 min  Charges:  $Gait Training: 8-22 mins                     Julaine Fusi PTA 12/21/19, 3:43 PM

## 2019-12-21 NOTE — Evaluation (Signed)
Occupational Therapy Evaluation Patient Details Name: Teresa Moyer MRN: [id number] DOB: 10-04-1935 Today's Date: 12/21/2019    History of Present Illness Pt is an 84 y.o. female s/p L TKA secondary to degenerative arthrosis 12/20/19.  PMH includes aortic valve stenosis, htn, mitral insufficiency, OA, skin CA, R TKR 2013, CHF, h/o MI.   Clinical Impression   Pt seen for OT evaluation this date, POD#1 from above surgery. Pt was independent in dressing and toileting prior to surgery, however requiring assist from her husband for bathing and using 2WW for mobility due to L knee pain. Pt no longer drives, as she has macular degeneration (R eye > L eye). SOB reported at baseline; SpO2 93-96% throughout on room air. Pt is eager to return to PLOF with less pain and improved safety and independence. Pt currently requires Mod A for LB dressing/bathing while in seated position due to pain and limited AROM of L knee. She completed toilet transfer using RW with CGA and Mod I pericare. Pt and caregiver instructed in polar care mgt, falls prevention strategies, home/routines modifications, DME/AE for LB bathing and dressing tasks, and compression stocking mgt. Pt would benefit from skilled OT services including additional instruction in dressing techniques with or without assistive devices for dressing and bathing skills to support recall and carryover prior to discharge and ultimately to maximize safety, independence, and minimize falls risk and caregiver burden. Recommend HHOT upon discharge to address pt post-op recovery in the context of baseline SOB and macular degeneration.       Follow Up Recommendations  Supervision/Assistance - 24 hour;Home health OT    Equipment Recommendations  None recommended by OT    Recommendations for Other Services       Precautions / Restrictions Precautions Precautions: Fall Precaution Booklet Issued: No Restrictions Weight Bearing Restrictions: Yes LLE Weight Bearing:  Weight bearing as tolerated Other Position/Activity Restrictions: elevate heels      Mobility Bed Mobility Overal bed mobility: Needs Assistance Bed Mobility: Supine to Sit     Supine to sit: Min guard;HOB elevated     General bed mobility comments: deferred this session, pt up in recliner on arrival/departure  Transfers Overall transfer level: Needs assistance Equipment used: Rolling walker (2 wheeled) Transfers: Sit to/from Stand Sit to Stand: Min guard         General transfer comment: Min Guard from recliner and toilet with use of grab bars and RW; vc;s for UE/LE placement and overall technique required    Balance Overall balance assessment: Needs assistance Sitting-balance support: No upper extremity supported;Feet supported Sitting balance-Leahy Scale: Good Sitting balance - Comments: steady dynamic sitting reaching within BOS   Standing balance support: Single extremity supported;During functional activity Standing balance-Leahy Scale: Poor Standing balance comment: pt requiring at least single UE support for static standing balance                           ADL either performed or assessed with clinical judgement   ADL Overall ADL's : Needs assistance/impaired                         Toilet Transfer: Min guard;RW;Ambulation;Regular Toilet;Grab bars   Toileting- Clothing Manipulation and Hygiene: Modified independent;Sitting/lateral lean;With adaptive equipment       Functional mobility during ADLs: Min guard;Rolling walker;Cueing for safety;Cueing for sequencing General ADL Comments: Pt able to doff/don R sock with Mod I, Max for L sock. Given  LLE strength/ROM deficits, with require Mod-Max A for LB dressing/bathing and compression stocking mgt. Verbal cues for hand placement on RW and posture provided throughout.     Vision Baseline Vision/History: Macular Degeneration;Wears glasses (R eye > L eye) Wears Glasses: At all  times Patient Visual Report: No change from baseline Additional Comments: Pt reports that her central vision is blurry only if she closes her L eye     Perception     Praxis      Pertinent Vitals/Pain Pain Assessment: 0-10 Pain Score: 2  Pain Location: L knee Pain Descriptors / Indicators: Sore;Discomfort Pain Intervention(s): Limited activity within patient's tolerance;Monitored during session;Premedicated before session;Repositioned     Hand Dominance     Extremity/Trunk Assessment Upper Extremity Assessment Upper Extremity Assessment: Generalized weakness;Overall WFL for tasks assessed (grossly 4-/5 BUE)   Lower Extremity Assessment Lower Extremity Assessment: LLE deficits/detail;Overall WFL for tasks assessed RLE Deficits / Details: strength and ROM WFL LLE Deficits / Details: anticipated post-op deficits in strength/ROM   Cervical / Trunk Assessment Cervical / Trunk Assessment: Normal   Communication Communication Communication:  (mildly HOH)   Cognition Arousal/Alertness: Awake/alert Behavior During Therapy: WFL for tasks assessed/performed Overall Cognitive Status: Within Functional Limits for tasks assessed                                     General Comments  Pt reports SOB at baseline, SpO2 93-96% throughout. Still unable to urinate, NT to begin bladder scan at end of session.    Exercises Other Exercises: Pt and caregiver educated re: role of OT in acute care, compression stocking mgt, polar care mgt, safe DME/AE use for ADL participation, falls prevention strategies and adaptive strategies for ADL. Handout provided for carryover. Other Exercises: Provided verbal and visual cues for safe DME/AE use during ADL and functional mobility   Shoulder Instructions      Home Living Family/patient expects to be discharged to:: Private residence Living Arrangements: Spouse/significant other Available Help at Discharge: Family;Available 24 hours/day  (husband) Type of Home: House Home Access: Stairs to enter [employer] of Steps: 7 Entrance Stairs-Rails: Right;Left (uses L rail d/t R rail part of porch and can cause splinters) Home Layout: One level     Bathroom Shower/Tub: Teacher, early years/pre: Handicapped height     Home Equipment: Environmental consultant - 2 wheels;Cane - single point;Walker - 4 wheels;Bedside commode;Tub bench;Grab bars - tub/shower (maybe reacher)   Additional Comments: can use handle on tub bench when performing toilet transfers      Prior Functioning/Environment Level of Independence: Needs assistance  Gait / Transfers Assistance Needed: Pt reports no falls in past 6 months. Has been using 2WW since Jan 2021, [facility] prior. ADL's / Homemaking Assistance Needed: Pt reports she completes dressing/toileting independently. Her husband assists with showering. She no longer drives.   Comments: Pt has macular degeneration R>L eye resulting in some difficulties with color contrast and reading. Mild SOB during exertion at baseline (SpO2 ~91%).        OT Problem List: Decreased strength;Decreased range of motion;Decreased activity tolerance;Impaired balance (sitting and/or standing);Impaired vision/perception;Decreased knowledge of use of DME or AE;Pain;Cardiopulmonary status limiting activity      OT Treatment/Interventions: Self-care/ADL training;Therapeutic exercise;Therapeutic activities;Energy conservation;DME and/or AE instruction;Patient/family education;Visual/perceptual remediation/compensation;Balance training    OT Goals(Current goals can be found in the care plan section) Acute Rehab OT Goals Patient Stated Goal: To get  out of the hospital OT Goal Formulation: With patient Time For Goal Achievement: 01/04/20 Potential to Achieve Goals: Good ADL Goals Pt Will Perform Lower Body Dressing: with modified independence;sit to/from stand;with adaptive equipment (with LRAD) Pt Will Transfer to Toilet:  with modified independence;regular height toilet;grab bars;ambulating (with LRAD) Additional ADL Goal #1: Pt will independently instruct caregiver in compression stocking mgt to maximize safety and independence Additional ADL Goal #2: Pt will independently instruct caregiver in polar care mgt to maximize safety and independence  OT Frequency: Min 2X/week   Barriers to D/C:            Co-evaluation              AM-PAC OT "6 Clicks" Daily Activity     Outcome Measure Help from another person eating meals?: None Help from another person taking care of personal grooming?: None Help from another person toileting, which includes using toliet, bedpan, or urinal?: A Little Help from another person bathing (including washing, rinsing, drying)?: A Lot Help from another person to put on and taking off regular upper body clothing?: None Help from another person to put on and taking off regular lower body clothing?: A Lot 6 Click Score: 19   End of Session Equipment Utilized During Treatment: Gait belt;Rolling walker Nurse Communication: Mobility status;Other (comment) (Inability to urinate)  Activity Tolerance: Patient tolerated treatment well Patient left: in chair;with call bell/phone within reach;with chair alarm set;with family/visitor present;with SCD's reapplied  OT Visit Diagnosis: Other abnormalities of gait and mobility (R26.89);Other (comment);Muscle weakness (generalized) (M62.81);Pain (Macular Degeneration) Pain - Right/Left: Left Pain - part of body: Knee                Time: 0301-3143 OT Time Calculation (min): 47 min Charges:  OT General Charges $OT Visit: 1 Visit OT Evaluation $OT Eval Moderate Complexity: 1 Mod OT Treatments $Self Care/Home Management : 38-52 mins  Jerilynn Birkenhead, OTS 12/21/19, 11:00 AM

## 2019-12-21 NOTE — Anesthesia Postprocedure Evaluation (Signed)
Anesthesia Post Note  Patient: Teresa Moyer  Procedure(s) Performed: COMPUTER ASSISTED TOTAL KNEE ARTHROPLASTY (Left Knee)  Patient location during evaluation: Nursing Unit Anesthesia Type: Spinal Level of consciousness: oriented and awake and alert Pain management: pain level controlled Vital Signs Assessment: post-procedure vital signs reviewed and stable Respiratory status: spontaneous breathing and respiratory function stable Cardiovascular status: blood pressure returned to baseline and stable Postop Assessment: no headache, no backache, no apparent nausea or vomiting and patient able to bend at knees Anesthetic complications: no   No complications documented.   Last Vitals:  Vitals:   12/21/19 0430 12/21/19 0720  BP: 123/60 (!) 128/48  Pulse: (!) 56 63  Resp: 16 17  Temp: (!) 36.3 C (!) 36.4 C  SpO2: 94% 98%    Last Pain:  Vitals:   12/21/19 0720  TempSrc: Oral  PainSc:                  Caryl Asp

## 2019-12-21 NOTE — Progress Notes (Signed)
  Subjective: 1 Day Post-Op Procedure(s) (LRB): COMPUTER ASSISTED TOTAL KNEE ARTHROPLASTY (Left) Patient reports pain as well-controlled.   Patient is well, but has had significant nausea yesterday following her surgery. Plan is to go Home after hospital stay. Negative for chest pain and shortness of breath Fever: no Gastrointestinal: negative for nausea and vomiting this AM .  Patient has not had a bowel movement.  Objective: Vital signs in last 24 hours: Temp:  [97 F (36.1 C)-98 F (36.7 C)] 97.5 F (36.4 C) (07/08 0720) Pulse Rate:  [56-81] 63 (07/08 0720) Resp:  [13-18] 17 (07/08 0720) BP: (123-187)/(48-98) 128/48 (07/08 0720) SpO2:  [91 %-98 %] 98 % (07/08 0720) Weight:  [62.6 kg] 62.6 kg (07/07 1039)  Intake/Output from previous day:  Intake/Output Summary (Last 24 hours) at 12/21/2019 0925 Last data filed at 12/21/2019 0608 Gross per 24 hour  Intake 990 ml  Output 2560 ml  Net -1570 ml    Intake/Output this shift: No intake/output data recorded.  Labs: Recent Labs    12/20/19 1113  HGB 13.9   Recent Labs    12/20/19 1113  HCT 41.0   Recent Labs    12/20/19 1113  NA 135  K 4.1  CL 99  BUN 9  CREATININE 0.50  GLUCOSE 87   No results for input(s): LABPT, INR in the last 72 hours.   EXAM General - Patient is Alert, Appropriate and Oriented Extremity - Neurovascular intact Dorsiflexion/Plantar flexion intact Compartment soft Dressing/Incision -Postoperative dressing remains in place., Polar Care in place and working. , Hemovac in place.  Motor Function - intact, moving foot and toes well on exam.  Cardiovascular- systolic murmur noted, other regular rate and rhythm, no m/r/g Respiratory- Lungs clear to auscultation bilaterally Gastrointestinal- soft, nontender and active bowel sounds   Assessment/Plan: 1 Day Post-Op Procedure(s) (LRB): COMPUTER ASSISTED TOTAL KNEE ARTHROPLASTY (Left) Active Problems:   Total knee replacement  status  Estimated body mass index is 25.24 kg/m as calculated from the following:   Height as of this encounter: 5\' 2"  (1.575 m).   Weight as of this encounter: 62.6 kg. Advance diet Up with therapy Plan for discharge tomorrow    DVT Prophylaxis - Lovenox, Ted hose and foot pumps, Plavix Weight-Bearing as tolerated to left leg  Cassell Smiles, PA-C [facility] 12/21/2019, 9:25 AM

## 2019-12-21 NOTE — Evaluation (Signed)
Physical Therapy Evaluation Patient Details Name: Teresa Moyer MRN: [id number] DOB: 1936/01/26 Today's Date: 12/21/2019   History of Present Illness  Pt is an 84 y.o. female s/p L TKA secondary to degenerative arthrosis 12/20/19.  PMH includes aortic valve stenosis, htn, mitral insufficiency, OA, skin CA, R TKR 2013, CHF, h/o MI.  Clinical Impression  Prior to hospital admission, pt was ambulatory; lives with her husband in 1 level home with 7 STE with railings.  Currently pt is CGA semi-supine to sitting edge of bed; min assist to CGA with transfers; and CGA to ambulate 30 feet with RW.  Pain 2-3/10 L knee beginning of session and 2/10 end of session at rest.  L knee AROM 0-70 degrees.  Pt would benefit from skilled PT to address noted impairments and functional limitations (see below for any additional details).  Upon hospital discharge, pt would benefit from [place].    Follow Up Recommendations Home health PT;Supervision for mobility/OOB    Equipment Recommendations   (pt has RW and BSC at home already)    Recommendations for Other Services OT consult     Precautions / Restrictions Precautions Precautions: Knee;Fall Precaution Booklet Issued: Yes (comment) Restrictions Weight Bearing Restrictions: Yes LLE Weight Bearing: Weight bearing as tolerated      Mobility  Bed Mobility Overal bed mobility: Needs Assistance Bed Mobility: Supine to Sit     Supine to sit: Min guard;HOB elevated     General bed mobility comments: CGA for safety; assist for lines; mild increased effort to perform  Transfers Overall transfer level: Needs assistance Equipment used: Rolling walker (2 wheeled) Transfers: Sit to/from Stand Sit to Stand: Min assist;Min guard         General transfer comment: min assist to stand from bed and CGA to stand from recliner; vc's for UE/LE placement and overall technique required  Ambulation/Gait Ambulation/Gait assistance: Min guard Gait Distance (Feet): 30  Feet Assistive device: Rolling walker (2 wheeled)   Gait velocity: decreased   General Gait Details: decreased stance time L LE; vc's for overall sequencing, walker use, and to offweight L LE with UE's through RW when advancing R LE  Stairs            Wheelchair Mobility    Modified Rankin (Stroke Patients Only)       Balance Overall balance assessment: Needs assistance Sitting-balance support: No upper extremity supported;Feet supported Sitting balance-Leahy Scale: Good Sitting balance - Comments: steady sitting reaching within BOS   Standing balance support: Single extremity supported Standing balance-Leahy Scale: Poor Standing balance comment: pt requiring at least single UE support for static standing balance                             Pertinent Vitals/Pain Pain Assessment: 0-10 Pain Score: 2  Pain Location: L knee Pain Descriptors / Indicators: Sore Pain Intervention(s): Limited activity within patient's tolerance;Monitored during session;Premedicated before session;Repositioned;Other (comment) (OT to apply polar care end of OT session)  O2 sats 90-91% on room air throughout therapy session (nurse notified). HR WFL during sessions activities.    Home Living Family/patient expects to be discharged to:: Private residence Living Arrangements: Spouse/significant other Available Help at Discharge: Family;Available 24 hours/day Type of Home: House Home Access: Stairs to enter Entrance Stairs-Rails: Right;Left (uses L rail d/t R rail part of porch and can cause splinters) Entrance Stairs-Number of Steps: 7 Home Layout: One level Home Equipment: Walker - 2 wheels;Cane -  single point;Walker - 4 wheels;Bedside commode;Tub bench;Grab bars - tub/shower Additional Comments: can use handle on tub bench when performing toilet transfers    Prior Function Level of Independence: Independent         Comments: Pt reports no falls in past 6 months     Hand  Dominance        Extremity/Trunk Assessment   Upper Extremity Assessment Upper Extremity Assessment: Overall WFL for tasks assessed    Lower Extremity Assessment Lower Extremity Assessment: RLE deficits/detail;LLE deficits/detail RLE Deficits / Details: strength and ROM WFL LLE Deficits / Details: minimal assist for L LE SLR; at least 3/5 L hip flexion and ankle DF/PF; good L quad set isometric strength    Cervical / Trunk Assessment Cervical / Trunk Assessment: Normal  Communication   Communication:  (mildly HOH)  Cognition Arousal/Alertness: Awake/alert Behavior During Therapy: WFL for tasks assessed/performed Overall Cognitive Status: Within Functional Limits for tasks assessed                                        General Comments General comments (skin integrity, edema, etc.): pt reports h/o incontinence so per pt request, brief donned prior to OOB mobility and doffed end of session.  Nursing cleared pt for participation in physical therapy.  Pt agreeable to PT session.    Exercises Total Joint Exercises Ankle Circles/Pumps: AROM;Strengthening;Both;10 reps;Supine Quad Sets: AROM;Strengthening;Both;10 reps;Supine Short Arc Quad: AROM;Strengthening;Left;10 reps;Supine Heel Slides: AAROM;Strengthening;Left;10 reps;Supine Hip ABduction/ADduction: AAROM;Strengthening;Left;10 reps;Supine Straight Leg Raises: AAROM;Strengthening;Left;10 reps;Supine Goniometric ROM: L knee AROM 0-70 degrees   Assessment/Plan    PT Assessment Patient needs continued PT services  PT Problem List Decreased strength;Decreased range of motion;Decreased activity tolerance;Decreased balance;Decreased mobility;Decreased knowledge of use of DME;Decreased knowledge of precautions;Pain;Cardiopulmonary status limiting activity;Decreased skin integrity       PT Treatment Interventions DME instruction;Gait training;Stair training;Functional mobility training;Therapeutic  activities;Therapeutic exercise;Balance training;Patient/family education    PT Goals (Current goals can be found in the Care Plan section)  Acute Rehab PT Goals Patient Stated Goal: to improve mobility PT Goal Formulation: With patient Time For Goal Achievement: 01/04/20 Potential to Achieve Goals: Good    Frequency BID   Barriers to discharge        Co-evaluation               AM-PAC PT "6 Clicks" Mobility  Outcome Measure Help needed turning from your back to your side while in a flat bed without using bedrails?: None Help needed moving from lying on your back to sitting on the side of a flat bed without using bedrails?: A Little Help needed moving to and from a bed to a chair (including a wheelchair)?: A Little Help needed standing up from a chair using your arms (e.g., wheelchair or bedside chair)?: A Little Help needed to walk in hospital room?: A Little Help needed climbing 3-5 steps with a railing? : A Lot 6 Click Score: 18    End of Session Equipment Utilized During Treatment: Gait belt Activity Tolerance: Patient tolerated treatment well Patient left: in chair (with OT present for OT evaluation (OT to set pt up end of OT session)) Nurse Communication: Mobility status;Precautions;Weight bearing status;Other (comment) (pt's pain status and O2 sats 90-91% on room air during session) PT Visit Diagnosis: Other abnormalities of gait and mobility (R26.89);Muscle weakness (generalized) (M62.81);Difficulty in walking, not elsewhere classified (R26.2);Pain Pain - Right/Left: Left Pain -  part of body: Knee    Time: 0852-0940 PT Time Calculation (min) (ACUTE ONLY): 48 min   Charges:   PT Evaluation $PT Eval Low Complexity: 1 Low PT Treatments $Therapeutic Exercise: 8-22 mins $Therapeutic Activity: 8-22 mins       Leitha Bleak, PT 12/21/19, 10:04 AM

## 2019-12-21 NOTE — Progress Notes (Signed)
Bladder scanned performed to reveal over 625mls. In and out per order. See flowsheet for post in and out residual.

## 2019-12-22 MED ORDER — ENOXAPARIN SODIUM 40 MG/0.4ML ~~LOC~~ SOLN
40.0000 mg | SUBCUTANEOUS | 0 refills | Status: DC
Start: 2019-12-22 — End: 2020-01-06

## 2019-12-22 MED ORDER — TRAMADOL HCL 50 MG PO TABS: 50.0000 mg | ORAL_TABLET | ORAL | 0 refills | Status: DC | PRN

## 2019-12-22 MED ORDER — OXYCODONE HCL 5 MG PO TABS: 5.0000 mg | ORAL_TABLET | ORAL | 0 refills | Status: DC | PRN

## 2019-12-22 MED ORDER — CELECOXIB 200 MG PO CAPS: 200.0000 mg | ORAL_CAPSULE | Freq: Two times a day (BID) | ORAL | 0 refills | Status: DC

## 2019-12-22 NOTE — Progress Notes (Signed)
Discharge summary reviewed with verbal understanding. Lovenox kit given. Rx called to [facility].

## 2019-12-22 NOTE — Plan of Care (Signed)

## 2019-12-22 NOTE — Progress Notes (Signed)
Physical Therapy Treatment Patient Details Name: Teresa Moyer MRN: [id number] DOB: [birth year] Today's Date: 12/22/2019    History of Present Illness Pt is an 84 y.o. female s/p L TKA secondary to degenerative arthrosis 12/20/19.  PMH includes aortic valve stenosis, htn, mitral insufficiency, OA, skin CA, R TKR 2013, CHF, h/o MI.    PT Comments    Full lap around nursing station completed with reciprocal stepping pattern; fair step height/length L LE. Min cuing for L hip/knee extension in loading, postural extension/upward gaze and overall walker position.  Slow and cautious with gait efforts, but no buckling or LOB.  Two, self-initiated, standing rest breaks required to complete distance.  Patient eager for discharge home; no additional questions/concerns at this time.    Follow Up Recommendations  Home health PT     Equipment Recommendations       Recommendations for Other Services       Precautions / Restrictions Precautions Precautions: Fall Restrictions Weight Bearing Restrictions: Yes LLE Weight Bearing: Weight bearing as tolerated    Mobility  Bed Mobility Overal bed mobility: Modified Independent Bed Mobility: Supine to Sit;Sit to Supine     Supine to sit: Modified independent (Device/Increase time) Sit to supine: Modified independent (Device/Increase time)   General bed mobility comments: seated in recliner beginning/end of treatment session  Transfers Overall transfer level: Needs assistance Equipment used: Rolling walker (2 wheeled) Transfers: Sit to/from Stand Sit to Stand: Supervision         General transfer comment: good carry-over of education from AM session  Ambulation/Gait Ambulation/Gait assistance: Supervision Gait Distance (Feet): 180 Feet Assistive device: Rolling walker (2 wheeled)   Gait velocity: 10' walk time, 14-15 seconds Gait velocity interpretation: <1.31 ft/sec, indicative of household ambulator General Gait Details: Full lap  around nursing station completed with reciprocal stepping pattern; fair step height/length L LE. Min cuing for L hip/knee extension in loading, postural extension/upward gaze and overall walker position.  Slow and cautious with gait efforts, but no buckling or LOB.  Two, self-initiated, standing rest breaks required to complete distance.   Stairs Stairs: Yes Stairs assistance: Min assist;Min guard   Number of Stairs:  (up/down 6 x2) General stair comments: trialed with both bilat UEs on L ascending rail and L rail/R HHA, requiring cga/min assist for each. Good demonstration of technique, fair/good L knee stability in modified SLS.  Patient preferring L rail/R HHA for ascending; bilat UEs on L ascending rail when descending.  Husband present and able to return demonstration of safe guarding technique.  Both patient/husband comfortable with performance/level of assist required.   Wheelchair Mobility    Modified Rankin (Stroke Patients Only)       Balance Overall balance assessment: Needs assistance Sitting-balance support: No upper extremity supported;Feet supported Sitting balance-Leahy Scale: Good     Standing balance support: Bilateral upper extremity supported Standing balance-Leahy Scale: Fair                              Cognition Arousal/Alertness: Awake/alert Behavior During Therapy: WFL for tasks assessed/performed Overall Cognitive Status: Within Functional Limits for tasks assessed                                        Exercises Total Joint Exercises Goniometric ROM: L knee: 0-80 degrees, seated in recliner Other Exercises Other Exercises: Toilet transfer,  ambulatory with RW, close sup; sit/stand from [facility] with RW, close sup Other Exercises: Assisted with clothing change prior to discharge-set up/sup for upper body dressing; min/mod assist for lower body dressing (pants, socks and shoes)    General Comments        Pertinent  Vitals/Pain Pain Assessment: Faces Faces Pain Scale: Hurts little more Pain Location: L knee Pain Descriptors / Indicators: Discomfort;Grimacing Pain Intervention(s): Limited activity within patient's tolerance;Monitored during session;Repositioned;Patient requesting pain meds-RN notified    Home Living                      Prior Function            PT Goals (current goals can now be found in the care plan section) Acute Rehab PT Goals Patient Stated Goal: " To get better so I can go home." PT Goal Formulation: With patient Time For Goal Achievement: 01/04/20 Potential to Achieve Goals: Good Progress towards PT goals: Progressing toward goals    Frequency    BID      PT Plan Current plan remains appropriate    Co-evaluation              AM-PAC PT "6 Clicks" Mobility   Outcome Measure  Help needed turning from your back to your side while in a flat bed without using bedrails?: None Help needed moving from lying on your back to sitting on the side of a flat bed without using bedrails?: None Help needed moving to and from a bed to a chair (including a wheelchair)?: None Help needed standing up from a chair using your arms (e.g., wheelchair or bedside chair)?: None Help needed to walk in hospital room?: None Help needed climbing 3-5 steps with a railing? : A Little 6 Click Score: 23    End of Session Equipment Utilized During Treatment: Gait belt Activity Tolerance: Patient tolerated treatment well Patient left: in chair;with call bell/phone within reach;with chair alarm set;with family/visitor present Nurse Communication: Patient requests pain meds PT Visit Diagnosis: Other abnormalities of gait and mobility (R26.89);Muscle weakness (generalized) (M62.81);Difficulty in walking, not elsewhere classified (R26.2);Pain Pain - Right/Left: Left Pain - part of body: Knee     Time: 1610-9604 PT Time Calculation (min) (ACUTE ONLY): 24 min  Charges:  $Gait  Training: 8-22 mins $Therapeutic Activity: 8-22 mins                     Kenyonna Micek H. Owens Shark, PT, DPT, NCS 12/22/19, 3:58 PM [phone]

## 2019-12-22 NOTE — Discharge Summary (Addendum)
Physician Discharge Summary  Patient ID: Teresa Moyer MRN: [id number] DOB/AGE: 1936/04/29 84 y.o.  Admit date: 12/20/2019 Discharge date: 12/22/2019  Admission Diagnoses:  Total knee replacement status [Z96.659]  Surgeries:Procedure(s): Left total knee arthroplasty using computer-assisted navigation  SURGEON:  Marciano Sequin. M.D.  ASSISTANT: Cassell Smiles, PA-C (present and scrubbed throughout the case, critical for assistance with exposure, retraction, instrumentation, and closure)  ANESTHESIA: spinal  ESTIMATED BLOOD LOSS: 50 mL  FLUIDS REPLACED: 1500 mL of crystalloid  TOURNIQUET TIME: 133 minutes  DRAINS: 2 medium Hemovac drains  SOFT TISSUE RELEASES: Anterior cruciate ligament, posterior cruciate ligament, deep medial collateral ligament, patellofemoral ligament, and posterolateral corner  IMPLANTS UTILIZED: DePuy Attune size 3N posterior stabilized femoral component (cemented), size 2 rotating platform tibial component (cemented), 35 mm medialized dome patella (cemented), and a 5 mm stabilized rotating platform polyethylene insert.  Discharge Diagnoses: Patient Active Problem List   Diagnosis Date Noted  . Total knee replacement status 12/20/2019  . Chronic systolic CHF (congestive heart failure), NYHA class 2 ([place]) 09/06/2019  . Acute respiratory failure with hypoxia ([place]) 01/30/2019  . Coronary artery disease involving native coronary artery of native heart 01/11/2019  . STEMI (ST elevation myocardial infarction) ([place]) 12/27/2018  . STEMI involving left anterior descending coronary artery ([place]) 12/27/2018  . Acute on chronic cholecystitis 02/16/2018  . Gallstones 12/15/2017  . Actinic keratosis 12/18/2014  . Allergic rhinitis 12/18/2014  . Arthritis 12/18/2014  . Basal cell carcinoma of skin 12/18/2014  . Gonalgia 12/18/2014  . Narrowing of intervertebral disc space 12/18/2014  . Essential (primary) hypertension 12/18/2014  . Gastro-esophageal reflux  disease without esophagitis 12/18/2014  . HLD (hyperlipidemia) 12/18/2014  . Cardiac murmur 12/18/2014  . Arthritis, degenerative 12/18/2014  . Hypertonicity of bladder 12/18/2014  . Detrusor muscle hypertonia 12/18/2014  . Heart valve disease 12/18/2014  . Asymptomatic varicose veins 12/18/2014    Past Medical History:  Diagnosis Date  . Cataracts, bilateral   . CHF (congestive heart failure) ([place]) 02/2019  . Coronary artery disease   . Environmental allergies   . Family history of adverse reaction to anesthesia    sister-nauseated  . GERD (gastroesophageal reflux disease)   . Heart murmur 2019   had since a child. dr. Nehemiah Massed follows d/t getting louder  . Hemorrhoids   . History of chickenpox   . Hypertension   . Incontinence in female   . Macular degeneration   . Myocardial infarction ([place]) 01/2019   1 stent   . Osteoarthritis   . Skin cancer 12/2017/11-2019   BCC. nose removed from nose last week.  shoulder squamos cell removed previously  . Type O blood, Rh negative 12/2017   patient requests that this be present on her chart     Transfusion:    Consultants (if any):   Discharged Condition: Improved  Hospital Course: Teresa Moyer is an 84 y.o. female who was admitted 12/20/2019 with a diagnosis of left knee osteoarthritis and went to the operating room on 12/20/2019 and underwent left total knee arthroplasty. The patient received perioperative antibiotics for prophylaxis (see below). The patient tolerated the procedure well and was transported to PACU in stable condition. After meeting PACU criteria, the patient was subsequently transferred to the Orthopaedics/Rehabilitation unit.   The patient received DVT prophylaxis in the form of early mobilization, Lovenox, Foot Pumps, TED hose and Plavix. A sacral pad had been placed and heels were elevated off of the bed with rolled towels in order to protect skin  integrity. Foley catheter was discontinued on postoperative day  #0. Wound drains were discontinued on postoperative day #2. The surgical incision was healing well without signs of infection.  Physical therapy was initiated postoperatively for transfers, gait training, and strengthening. Occupational therapy was initiated for activities of daily living and evaluation for assisted devices. Rehabilitation goals were reviewed in detail with the patient. The patient made steady progress with physical therapy and physical therapy recommended discharge to Home.   The patient achieved the preliminary goals of this hospitalization and was felt to be medically and orthopaedically appropriate for discharge.  She was given perioperative antibiotics:  Anti-infectives (From admission, onward)   Start     Dose/Rate Route Frequency Ordered Stop   12/20/19 2030  ceFAZolin (ANCEF) IVPB 2g/100 mL premix        2 g 200 mL/hr over 30 Minutes Intravenous Every 6 hours 12/20/19 2017 12/21/19 2000   12/20/19 1030  ceFAZolin (ANCEF) IVPB 2g/100 mL premix        2 g 200 mL/hr over 30 Minutes Intravenous On call to O.R. 12/20/19 1019 12/20/19 1203   12/20/19 1025  ceFAZolin (ANCEF) 2-4 GM/100ML-% IVPB       Note to Pharmacy: Leonia Reader   : cabinet override      12/20/19 1025 12/20/19 1237    .  Recent vital signs:  Vitals:   12/22/19 0811 12/22/19 1544  BP: (!) 146/65 (!) 143/53  Pulse: 69 75  Resp: 17 17  Temp: (!) 97.5 F (36.4 C) 98 F (36.7 C)  SpO2: 97% 98%    Recent laboratory studies:  Recent Labs    12/20/19 1113  HGB 13.9  HCT 41.0  K 4.1  CL 99  BUN 9  CREATININE 0.50  GLUCOSE 87    Diagnostic Studies: DG Knee Left Port  Result Date: 12/20/2019 CLINICAL DATA:  Total knee replacement. EXAM: PORTABLE LEFT KNEE - 1-2 VIEW COMPARISON:  None. FINDINGS: Left knee arthroplasty in expected alignment. There is been patellar resurfacing. No periprosthetic lucency or fracture. Recent postsurgical change includes air and edema in the joint space and soft  tissues with suprapatellar drain in place. Anterior skin staples. There are ghost tracks in the femur and tibia. IMPRESSION: Left knee arthroplasty without immediate postoperative complication. Electronically Signed   By: Keith Rake M.D.   On: 12/20/2019 17:29    Discharge Medications:   Allergies as of 12/22/2019      Reactions   Accupril [quinapril Hcl] Hives, Swelling   SWELLING OF NOSE/FACE   Sulfa Antibiotics Shortness Of Breath, Swelling   Clinoril [sulindac] Other (See Comments)   Unsure of reaction type   Hydrochlorothiazide Other (See Comments)   Unsure of reaction type   Spironolactone Diarrhea   Tessalon [benzonatate] Other (See Comments)   Unsure of reaction type   Amoxicillin Rash   Has patient had a PCN reaction causing immediate rash, facial/tongue/throat swelling, SOB or lightheadedness with hypotension: Unknown Has patient had a PCN reaction causing severe rash involving mucus membranes or skin necrosis: Unknown Has patient had a PCN reaction that required hospitalization: No Has patient had a PCN reaction occurring within the last 10 years:Possibly unsure  If all of the above answers are "NO", then may proceed with Cephalosporin use.   Codeine Sulfate Nausea And Vomiting   Penicillins Rash   Has patient had a PCN reaction causing immediate rash, facial/tongue/throat swelling, SOB or lightheadedness with hypotension: Unknown Has patient had a PCN reaction causing severe rash  involving mucus membranes or skin necrosis: Unknown Has patient had a PCN reaction that required hospitalization: No Has patient had a PCN reaction occurring within the last 10 years:Possibly unsure  If all of the above answers are "NO", then may proceed with Cephalosporin use.      Medication List    STOP taking these medications   aspirin 81 MG chewable tablet     TAKE these medications   acetaminophen 650 MG CR tablet Commonly known as: TYLENOL Take 1,300 mg by mouth every 8 (eight)  hours.   Align 4 MG Caps Take 4 mg by mouth daily after lunch. Notes to patient: Not given in hospital   atorvastatin 40 MG tablet Commonly known as: LIPITOR Take 1 tablet (40 mg total) by mouth daily at 6 PM.   azelastine 0.1 % nasal spray Commonly known as: ASTELIN Place 1 spray into both nostrils at bedtime.   carvedilol 12.5 MG tablet Commonly known as: COREG Take 12.5 mg by mouth 2 (two) times daily with a meal.   celecoxib 200 MG capsule Commonly known as: CELEBREX Take 1 capsule (200 mg total) by mouth 2 (two) times daily.   clopidogrel 75 MG tablet Commonly known as: PLAVIX Take 1 tablet by mouth at bedtime.   enoxaparin 40 MG/0.4ML injection Commonly known as: LOVENOX Inject 0.4 mLs (40 mg total) into the skin daily for 14 days.   fluticasone 50 MCG/ACT nasal spray Commonly known as: FLONASE Place 1 spray into both nostrils daily.   furosemide 20 MG tablet Commonly known as: LASIX TAKE 1 TABLET BY MOUTH ONCE DAILY What changed: when to take this   GenTeal 0.25-0.3 % Gel Generic drug: Carboxymethylcell-Hypromellose Apply 1 application to eye at bedtime.   irbesartan 300 MG tablet Commonly known as: AVAPRO Take 0.5 tablets (150 mg total) by mouth daily. What changed: when to take this   JOINT FORMULA PO Take 1 capsule by mouth daily before breakfast. Arthrozene   omeprazole 20 MG capsule Commonly known as: PRILOSEC TAKE 1 CAPSULE BY MOUTH ONCE DAILY What changed: when to take this Notes to patient: Not given in hospital   oxyCODONE 5 MG immediate release tablet Commonly known as: Oxy IR/ROXICODONE Take 1 tablet (5 mg total) by mouth every 4 (four) hours as needed for moderate pain (pain score 4-6).   potassium chloride 10 MEQ tablet Commonly known as: Klor-Con 10 Take 1 tablet (10 mEq total) by mouth daily.   PRESERVISION AREDS PO Take 1 tablet by mouth 2 (two) times daily.   Systane 0.4-0.3 % Soln Generic drug: Polyethyl Glycol-Propyl  Glycol Place 1 drop into both eyes in the morning and at bedtime.   traMADol 50 MG tablet Commonly known as: ULTRAM TAKE 1 TABLET BY MOUTH EVERY 6 HOURS AS NEEDED What changed:   how much to take  when to take this   traMADol 50 MG tablet Commonly known as: ULTRAM Take 1 tablet (50 mg total) by mouth every 4 (four) hours as needed for moderate pain. What changed: You were already taking a medication with the same name, and this prescription was added. Make sure you understand how and when to take each.   ZyrTEC Allergy 10 MG tablet Generic drug: cetirizine Take 10 mg by mouth at bedtime.            Durable Medical Equipment  (From admission, onward)         Start     Ordered   12/20/19 2018  DME  Walker rolling  Once       Question:  Patient needs a walker to treat with the following condition  Answer:  Total knee replacement status   12/20/19 2017   12/20/19 2018  DME Bedside commode  Once       Question:  Patient needs a bedside commode to treat with the following condition  Answer:  Total knee replacement status   12/20/19 2017          Disposition: Discharge disposition: 01-Home or Self Care     home with home health PT      Follow-up Information    [facility] On 01/04/2020.   Specialty: Orthopedic Surgery Why: at 9:45am Contact information: [place] [id number] [place], PA-C 12/22/2019, 5:01 PM

## 2019-12-22 NOTE — Care Management Important Message (Signed)
Important Message  Patient Details  Name: MAZY CULTON MRN: [id number] Date of Birth: 27-Jun-1935   Medicare Important Message Given:  N/A - LOS <3 / Initial given by admissions     Juliann Pulse A Jeweldean Drohan 12/22/2019, 9:26 AM

## 2019-12-22 NOTE — Progress Notes (Signed)
Physical Therapy Treatment Patient Details Name: Teresa Moyer MRN: [id number] DOB: 1935/09/21 Today's Date: 12/22/2019    History of Present Illness Pt is an 84 y.o. female s/p L TKA secondary to degenerative arthrosis 12/20/19.  PMH includes aortic valve stenosis, htn, mitral insufficiency, OA, skin CA, R TKR 2013, CHF, h/o MI.    PT Comments    Patient initially agreeable to participation with session, requesting transfer to toilet prior to out of room mobility.  Able to complete bed mobility, basic transfers and in-room mobility with sup/mod indep.  Does report mild increase in pain to L LE with mobility; requests additional activity be held until pain meds received and effective.  Will return at later time-needs to complete lap around nursing station and stairs prior to potential discharge.   RN informed/aware of patient request for pain medication.     Follow Up Recommendations  Home health PT;Supervision for mobility/OOB     Equipment Recommendations       Recommendations for Other Services       Precautions / Restrictions Precautions Precautions: Fall Restrictions Weight Bearing Restrictions: Yes LLE Weight Bearing: Weight bearing as tolerated    Mobility  Bed Mobility Overal bed mobility: Modified Independent             General bed mobility comments: able to mobilize L LE without assist from therapist; good quad activation and control  Transfers Overall transfer level: Needs assistance Equipment used: Rolling walker (2 wheeled) Transfers: Sit to/from Stand Sit to Stand: Supervision         General transfer comment: cuing for hand placement  Ambulation/Gait Ambulation/Gait assistance: Supervision Gait Distance (Feet):  (15' x1, 20' x1) Assistive device: Rolling walker (2 wheeled)       General Gait Details: step through gait pattern with mod WBing bilat UEs; increased pain reported with WBing (awaiting pain meds). Mild forward flexed trunk posture;  slow and deliberate, but no buckling or LOB   Stairs             Wheelchair Mobility    Modified Rankin (Stroke Patients Only)       Balance Overall balance assessment: Needs assistance Sitting-balance support: No upper extremity supported;Feet supported Sitting balance-Leahy Scale: Good     Standing balance support: Bilateral upper extremity supported Standing balance-Leahy Scale: Good                              Cognition Arousal/Alertness: Awake/alert Behavior During Therapy: WFL for tasks assessed/performed Overall Cognitive Status: Within Functional Limits for tasks assessed                                        Exercises Other Exercises Other Exercises: Toilet transfer, ambulatory with RW, close sup; min cuing for visual awareness of environment due to baseline visual deficits.  Sit/stand from [facility] with RW, close sup; standing balance at sink for hand hygiene, close sup.  Good awraeness or safety needs and limits of stability.  Requests hold on additional gait/mobility efforts until meds received and effective.    General Comments        Pertinent Vitals/Pain Pain Assessment: Faces Faces Pain Scale: Hurts little more Pain Location: L knee Pain Descriptors / Indicators: Discomfort;Grimacing Pain Intervention(s): Limited activity within patient's tolerance;Monitored during session;Repositioned;Patient requesting pain meds-RN notified    Home Living  Prior Function            PT Goals (current goals can now be found in the care plan section) Acute Rehab PT Goals Patient Stated Goal: " To get better so I can go home." PT Goal Formulation: With patient Time For Goal Achievement: 01/04/20 Potential to Achieve Goals: Good Progress towards PT goals: Progressing toward goals    Frequency    BID      PT Plan Current plan remains appropriate    Co-evaluation              AM-PAC PT  "6 Clicks" Mobility   Outcome Measure  Help needed turning from your back to your side while in a flat bed without using bedrails?: None Help needed moving from lying on your back to sitting on the side of a flat bed without using bedrails?: None Help needed moving to and from a bed to a chair (including a wheelchair)?: None Help needed standing up from a chair using your arms (e.g., wheelchair or bedside chair)?: None Help needed to walk in hospital room?: None Help needed climbing 3-5 steps with a railing? : A Little 6 Click Score: 23    End of Session Equipment Utilized During Treatment: Gait belt Activity Tolerance: Patient limited by pain Patient left: in chair;with call bell/phone within reach;with chair alarm set;with family/visitor present Nurse Communication: Patient requests pain meds PT Visit Diagnosis: Other abnormalities of gait and mobility (R26.89);Muscle weakness (generalized) (M62.81);Difficulty in walking, not elsewhere classified (R26.2);Pain Pain - Right/Left: Left Pain - part of body: Knee     Time: 6387-5643 PT Time Calculation (min) (ACUTE ONLY): 13 min  Charges:  $Therapeutic Activity: 8-22 mins                     Ailene Royal H. Owens Shark, PT, DPT, NCS 12/22/19, 10:45 AM [phone]

## 2019-12-22 NOTE — Progress Notes (Signed)
Physical Therapy Treatment Patient Details Name: Teresa Moyer MRN: [id number] DOB: 01/22/1936 Today's Date: 12/22/2019    History of Present Illness Pt is an 84 y.o. female s/p L TKA secondary to degenerative arthrosis 12/20/19.  PMH includes aortic valve stenosis, htn, mitral insufficiency, OA, skin CA, R TKR 2013, CHF, h/o MI.    PT Comments    Pain meds now received and patient agreeable to full session.  Able to progress gait distance, though fatiguse quickly and requires 1-2 self-initiated standing rest breaks to complete distance.  Did also initiate stair training.  Trialed (up/down 6) with both bilat UEs on L ascending rail and L rail/R HHA, requiring cga/min assist for each. Good demonstration of technique, fair/good L knee stability in modified SLS.  Patient preferring L rail/R HHA for ascending; bilat UEs on L ascending rail when descending.  Husband present and able to return demonstration of safe guarding technique.  Both patient/husband comfortable with performance/level of assist required.     Follow Up Recommendations  Home health PT     Equipment Recommendations       Recommendations for Other Services       Precautions / Restrictions Precautions Precautions: Fall Restrictions Weight Bearing Restrictions: Yes LLE Weight Bearing: Weight bearing as tolerated    Mobility  Bed Mobility Overal bed mobility: Modified Independent             General bed mobility comments: seated in recliner beginning/end of treatment session  Transfers Overall transfer level: Needs assistance Equipment used: Rolling walker (2 wheeled) Transfers: Sit to/from Stand Sit to Stand: Supervision         General transfer comment: L LE slightly anterior to BOS with movement transitions  Ambulation/Gait Ambulation/Gait assistance: Min guard;Supervision Gait Distance (Feet): 145 Feet Assistive device: Rolling walker (2 wheeled)       General Gait Details: step through gait  pattern with fair stance itime/weight shift to L LE; mild forward trunk flexion, improved with cuing for postural extension and walker position.  Fatigues quickly; requiring 1-2 standing rest peridos to complete distance   Stairs Stairs: Yes Stairs assistance: Min assist;Min guard   Number of Stairs:  (up/down 6 x2) General stair comments: trialed with both bilat UEs on L ascending rail and L rail/R HHA, requiring cga/min assist for each. Good demonstration of technique, fair/good L knee stability in modified SLS.  Patient preferring L rail/R HHA for ascending; bilat UEs on L ascending rail when descending.  Husband present and able to return demonstration of safe guarding technique.  Both patient/husband comfortable with performance/level of assist required.   Wheelchair Mobility    Modified Rankin (Stroke Patients Only)       Balance Overall balance assessment: Needs assistance Sitting-balance support: No upper extremity supported;Feet supported Sitting balance-Leahy Scale: Good     Standing balance support: Bilateral upper extremity supported Standing balance-Leahy Scale: Good                              Cognition Arousal/Alertness: Awake/alert Behavior During Therapy: WFL for tasks assessed/performed Overall Cognitive Status: Within Functional Limits for tasks assessed                                        Exercises Total Joint Exercises Goniometric ROM: L knee: 0-80 degrees, seated in recliner Other Exercises Other Exercises: Verbally  reviewed car transfer technique; patient/husband voiced understanding and agreement.    General Comments        Pertinent Vitals/Pain Pain Assessment: Faces Faces Pain Scale: Hurts little more Pain Location: L knee Pain Descriptors / Indicators: Discomfort;Grimacing Pain Intervention(s): Monitored during session;Premedicated before session;Repositioned;Limited activity within patient's tolerance     Home Living                      Prior Function            PT Goals (current goals can now be found in the care plan section) Acute Rehab PT Goals Patient Stated Goal: " To get better so I can go home." PT Goal Formulation: With patient Time For Goal Achievement: 01/04/20 Potential to Achieve Goals: Good Progress towards PT goals: Progressing toward goals    Frequency    BID      PT Plan Current plan remains appropriate    Co-evaluation              AM-PAC PT "6 Clicks" Mobility   Outcome Measure  Help needed turning from your back to your side while in a flat bed without using bedrails?: None Help needed moving from lying on your back to sitting on the side of a flat bed without using bedrails?: None Help needed moving to and from a bed to a chair (including a wheelchair)?: None Help needed standing up from a chair using your arms (e.g., wheelchair or bedside chair)?: None Help needed to walk in hospital room?: None Help needed climbing 3-5 steps with a railing? : A Little 6 Click Score: 23    End of Session Equipment Utilized During Treatment: Gait belt Activity Tolerance: Patient limited by pain Patient left: in chair;with call bell/phone within reach;with chair alarm set;with family/visitor present Nurse Communication: Patient requests pain meds PT Visit Diagnosis: Other abnormalities of gait and mobility (R26.89);Muscle weakness (generalized) (M62.81);Difficulty in walking, not elsewhere classified (R26.2);Pain Pain - Right/Left: Left Pain - part of body: Knee     Time: 1130-1202 PT Time Calculation (min) (ACUTE ONLY): 32 min  Charges:  $Gait Training: 23-37 mins $Therapeutic Activity: 8-22 mins                     Sakina Briones H. Owens Shark, PT, DPT, NCS 12/22/19, 12:54 PM [phone]

## 2019-12-22 NOTE — Progress Notes (Addendum)
  Subjective: 2 Days Post-Op Procedure(s) (LRB): COMPUTER ASSISTED TOTAL KNEE ARTHROPLASTY (Left) Patient reports pain as well-controlled.   Patient is well, and has had no acute complaints or problems Plan is to go Home after hospital stay. Negative for chest pain and shortness of breath Fever: no Gastrointestinal: negative for nausea and vomiting.   Patient has had a bowel movement. Patient has been able to void.   Objective: Vital signs in last 24 hours: Temp:  [97.4 F (36.3 C)-98.4 F (36.9 C)] 97.5 F (36.4 C) (07/09 0811) Pulse Rate:  [65-86] 69 (07/09 0811) Resp:  [16-19] 17 (07/09 0811) BP: (114-146)/(50-65) 146/65 (07/09 0811) SpO2:  [95 %-97 %] 97 % (07/09 0811)  Intake/Output from previous day:  Intake/Output Summary (Last 24 hours) at 12/22/2019 0918 Last data filed at 12/22/2019 3704 Gross per 24 hour  Intake 2152.7 ml  Output 249 ml  Net 1903.7 ml    Intake/Output this shift: No intake/output data recorded.  Labs: Recent Labs    12/20/19 1113  HGB 13.9   Recent Labs    12/20/19 1113  HCT 41.0   Recent Labs    12/20/19 1113  NA 135  K 4.1  CL 99  BUN 9  CREATININE 0.50  GLUCOSE 87   No results for input(s): LABPT, INR in the last 72 hours.   EXAM General - Patient is Alert, Appropriate and Oriented Extremity - Neurovascular intact Dorsiflexion/Plantar flexion intact Compartment soft Dressing/Incision -Postoperative dressing remains in place., Polar Care in place and working. , Hemovac in place. , Following post-op dressing removal, honeycomb dressing is saturated with blood. Motor Function - intact, moving foot and toes well on exam.  Cardiovascular- S1 murmur heard, otherwise regular rate and rhythm Respiratory- Lungs clear to auscultation bilaterally Gastrointestinal- soft, nontender and active bowel sounds   Assessment/Plan: 2 Days Post-Op Procedure(s) (LRB): COMPUTER ASSISTED TOTAL KNEE ARTHROPLASTY (Left) Active Problems:    Total knee replacement status  Estimated body mass index is 25.24 kg/m as calculated from the following:   Height as of this encounter: 5\' 2"  (1.575 m).   Weight as of this encounter: 62.6 kg. Advance diet Up with therapy Discharge home with home health  Hemovac removed. Fresh honeycomb applied.  DVT Prophylaxis - Lovenox, Ted hose and foot pumps, Plavix Weight-Bearing as tolerated to left leg  Cassell Smiles, PA-C [facility] 12/22/2019, 9:18 AM

## 2019-12-23 DIAGNOSIS — I110 Hypertensive heart disease with heart failure: Secondary | ICD-10-CM | POA: Diagnosis not present

## 2019-12-23 DIAGNOSIS — I5022 Chronic systolic (congestive) heart failure: Secondary | ICD-10-CM | POA: Diagnosis not present

## 2019-12-23 DIAGNOSIS — Z7901 Long term (current) use of anticoagulants: Secondary | ICD-10-CM | POA: Diagnosis not present

## 2019-12-23 DIAGNOSIS — E785 Hyperlipidemia, unspecified: Secondary | ICD-10-CM | POA: Diagnosis not present

## 2019-12-23 DIAGNOSIS — I2510 Atherosclerotic heart disease of native coronary artery without angina pectoris: Secondary | ICD-10-CM | POA: Diagnosis not present

## 2019-12-23 DIAGNOSIS — Z471 Aftercare following joint replacement surgery: Secondary | ICD-10-CM | POA: Diagnosis not present

## 2019-12-23 DIAGNOSIS — K219 Gastro-esophageal reflux disease without esophagitis: Secondary | ICD-10-CM | POA: Diagnosis not present

## 2019-12-23 DIAGNOSIS — I080 Rheumatic disorders of both mitral and aortic valves: Secondary | ICD-10-CM | POA: Diagnosis not present

## 2019-12-23 DIAGNOSIS — Z86018 Personal history of other benign neoplasm: Secondary | ICD-10-CM | POA: Diagnosis not present

## 2019-12-23 DIAGNOSIS — K8010 Calculus of gallbladder with chronic cholecystitis without obstruction: Secondary | ICD-10-CM | POA: Diagnosis not present

## 2019-12-23 DIAGNOSIS — H3530 Unspecified macular degeneration: Secondary | ICD-10-CM | POA: Diagnosis not present

## 2019-12-23 DIAGNOSIS — J309 Allergic rhinitis, unspecified: Secondary | ICD-10-CM | POA: Diagnosis not present

## 2019-12-23 DIAGNOSIS — Z86010 Personal history of colonic polyps: Secondary | ICD-10-CM | POA: Diagnosis not present

## 2019-12-23 DIAGNOSIS — Z85828 Personal history of other malignant neoplasm of skin: Secondary | ICD-10-CM | POA: Diagnosis not present

## 2019-12-23 DIAGNOSIS — I252 Old myocardial infarction: Secondary | ICD-10-CM | POA: Diagnosis not present

## 2019-12-23 DIAGNOSIS — Z96653 Presence of artificial knee joint, bilateral: Secondary | ICD-10-CM | POA: Diagnosis not present

## 2019-12-24 DIAGNOSIS — R0902 Hypoxemia: Secondary | ICD-10-CM | POA: Diagnosis not present

## 2019-12-24 DIAGNOSIS — R52 Pain, unspecified: Secondary | ICD-10-CM | POA: Diagnosis not present

## 2019-12-24 DIAGNOSIS — R0602 Shortness of breath: Secondary | ICD-10-CM | POA: Diagnosis not present

## 2019-12-24 DIAGNOSIS — I10 Essential (primary) hypertension: Secondary | ICD-10-CM | POA: Diagnosis not present

## 2019-12-25 DIAGNOSIS — I5022 Chronic systolic (congestive) heart failure: Secondary | ICD-10-CM | POA: Diagnosis not present

## 2019-12-25 DIAGNOSIS — Z471 Aftercare following joint replacement surgery: Secondary | ICD-10-CM | POA: Diagnosis not present

## 2019-12-25 DIAGNOSIS — H3530 Unspecified macular degeneration: Secondary | ICD-10-CM | POA: Diagnosis not present

## 2019-12-25 DIAGNOSIS — I252 Old myocardial infarction: Secondary | ICD-10-CM | POA: Diagnosis not present

## 2019-12-25 DIAGNOSIS — I110 Hypertensive heart disease with heart failure: Secondary | ICD-10-CM | POA: Diagnosis not present

## 2019-12-25 DIAGNOSIS — I2510 Atherosclerotic heart disease of native coronary artery without angina pectoris: Secondary | ICD-10-CM | POA: Diagnosis not present

## 2019-12-27 DIAGNOSIS — I5022 Chronic systolic (congestive) heart failure: Secondary | ICD-10-CM | POA: Diagnosis not present

## 2019-12-27 DIAGNOSIS — I252 Old myocardial infarction: Secondary | ICD-10-CM | POA: Diagnosis not present

## 2019-12-27 DIAGNOSIS — H3530 Unspecified macular degeneration: Secondary | ICD-10-CM | POA: Diagnosis not present

## 2019-12-27 DIAGNOSIS — I110 Hypertensive heart disease with heart failure: Secondary | ICD-10-CM | POA: Diagnosis not present

## 2019-12-27 DIAGNOSIS — I2510 Atherosclerotic heart disease of native coronary artery without angina pectoris: Secondary | ICD-10-CM | POA: Diagnosis not present

## 2019-12-27 DIAGNOSIS — Z471 Aftercare following joint replacement surgery: Secondary | ICD-10-CM | POA: Diagnosis not present

## 2019-12-29 DIAGNOSIS — Z471 Aftercare following joint replacement surgery: Secondary | ICD-10-CM | POA: Diagnosis not present

## 2019-12-29 DIAGNOSIS — I5022 Chronic systolic (congestive) heart failure: Secondary | ICD-10-CM | POA: Diagnosis not present

## 2019-12-29 DIAGNOSIS — I2510 Atherosclerotic heart disease of native coronary artery without angina pectoris: Secondary | ICD-10-CM | POA: Diagnosis not present

## 2019-12-29 DIAGNOSIS — H3530 Unspecified macular degeneration: Secondary | ICD-10-CM | POA: Diagnosis not present

## 2019-12-29 DIAGNOSIS — I110 Hypertensive heart disease with heart failure: Secondary | ICD-10-CM | POA: Diagnosis not present

## 2019-12-29 DIAGNOSIS — I252 Old myocardial infarction: Secondary | ICD-10-CM | POA: Diagnosis not present

## 2020-01-01 DIAGNOSIS — Z471 Aftercare following joint replacement surgery: Secondary | ICD-10-CM | POA: Diagnosis not present

## 2020-01-01 DIAGNOSIS — I252 Old myocardial infarction: Secondary | ICD-10-CM | POA: Diagnosis not present

## 2020-01-01 DIAGNOSIS — I110 Hypertensive heart disease with heart failure: Secondary | ICD-10-CM | POA: Diagnosis not present

## 2020-01-01 DIAGNOSIS — H3530 Unspecified macular degeneration: Secondary | ICD-10-CM | POA: Diagnosis not present

## 2020-01-01 DIAGNOSIS — I2510 Atherosclerotic heart disease of native coronary artery without angina pectoris: Secondary | ICD-10-CM | POA: Diagnosis not present

## 2020-01-01 DIAGNOSIS — I5022 Chronic systolic (congestive) heart failure: Secondary | ICD-10-CM | POA: Diagnosis not present

## 2020-01-03 DIAGNOSIS — I2510 Atherosclerotic heart disease of native coronary artery without angina pectoris: Secondary | ICD-10-CM | POA: Diagnosis not present

## 2020-01-03 DIAGNOSIS — H3530 Unspecified macular degeneration: Secondary | ICD-10-CM | POA: Diagnosis not present

## 2020-01-03 DIAGNOSIS — I252 Old myocardial infarction: Secondary | ICD-10-CM | POA: Diagnosis not present

## 2020-01-03 DIAGNOSIS — I110 Hypertensive heart disease with heart failure: Secondary | ICD-10-CM | POA: Diagnosis not present

## 2020-01-03 DIAGNOSIS — Z471 Aftercare following joint replacement surgery: Secondary | ICD-10-CM | POA: Diagnosis not present

## 2020-01-03 DIAGNOSIS — I5022 Chronic systolic (congestive) heart failure: Secondary | ICD-10-CM | POA: Diagnosis not present

## 2020-01-04 ENCOUNTER — Emergency Department: Payer: Medicare Other

## 2020-01-04 ENCOUNTER — Other Ambulatory Visit: Payer: Self-pay

## 2020-01-04 ENCOUNTER — Inpatient Hospital Stay
Admission: EM | Admit: 2020-01-04 | Discharge: 2020-01-06 | DRG: 292 | Disposition: A | Discharge status: Home Health | Payer: Medicare Other | Attending: Internal Medicine | Admitting: Internal Medicine

## 2020-01-04 DIAGNOSIS — K922 Gastrointestinal hemorrhage, unspecified: Secondary | ICD-10-CM

## 2020-01-04 DIAGNOSIS — I10 Essential (primary) hypertension: Secondary | ICD-10-CM | POA: Diagnosis not present

## 2020-01-04 DIAGNOSIS — Z885 Allergy status to narcotic agent status: Secondary | ICD-10-CM

## 2020-01-04 DIAGNOSIS — K2960 Other gastritis without bleeding: Secondary | ICD-10-CM | POA: Diagnosis present

## 2020-01-04 DIAGNOSIS — D649 Anemia, unspecified: Secondary | ICD-10-CM | POA: Diagnosis not present

## 2020-01-04 DIAGNOSIS — I110 Hypertensive heart disease with heart failure: Principal | ICD-10-CM | POA: Diagnosis present

## 2020-01-04 DIAGNOSIS — H3530 Unspecified macular degeneration: Secondary | ICD-10-CM | POA: Diagnosis present

## 2020-01-04 DIAGNOSIS — Z96653 Presence of artificial knee joint, bilateral: Secondary | ICD-10-CM | POA: Diagnosis present

## 2020-01-04 DIAGNOSIS — K2970 Gastritis, unspecified, without bleeding: Secondary | ICD-10-CM | POA: Diagnosis not present

## 2020-01-04 DIAGNOSIS — Z880 Allergy status to penicillin: Secondary | ICD-10-CM | POA: Diagnosis not present

## 2020-01-04 DIAGNOSIS — I2510 Atherosclerotic heart disease of native coronary artery without angina pectoris: Secondary | ICD-10-CM | POA: Diagnosis present

## 2020-01-04 DIAGNOSIS — Z8249 Family history of ischemic heart disease and other diseases of the circulatory system: Secondary | ICD-10-CM | POA: Diagnosis not present

## 2020-01-04 DIAGNOSIS — R0600 Dyspnea, unspecified: Secondary | ICD-10-CM | POA: Diagnosis not present

## 2020-01-04 DIAGNOSIS — I517 Cardiomegaly: Secondary | ICD-10-CM | POA: Diagnosis not present

## 2020-01-04 DIAGNOSIS — Z79899 Other long term (current) drug therapy: Secondary | ICD-10-CM

## 2020-01-04 DIAGNOSIS — Z96652 Presence of left artificial knee joint: Secondary | ICD-10-CM | POA: Diagnosis present

## 2020-01-04 DIAGNOSIS — K219 Gastro-esophageal reflux disease without esophagitis: Secondary | ICD-10-CM | POA: Diagnosis present

## 2020-01-04 DIAGNOSIS — I5043 Acute on chronic combined systolic (congestive) and diastolic (congestive) heart failure: Secondary | ICD-10-CM | POA: Diagnosis present

## 2020-01-04 DIAGNOSIS — I509 Heart failure, unspecified: Secondary | ICD-10-CM | POA: Insufficient documentation

## 2020-01-04 DIAGNOSIS — Z881 Allergy status to other antibiotic agents status: Secondary | ICD-10-CM | POA: Diagnosis not present

## 2020-01-04 DIAGNOSIS — K259 Gastric ulcer, unspecified as acute or chronic, without hemorrhage or perforation: Secondary | ICD-10-CM | POA: Diagnosis not present

## 2020-01-04 DIAGNOSIS — Z85828 Personal history of other malignant neoplasm of skin: Secondary | ICD-10-CM

## 2020-01-04 DIAGNOSIS — E785 Hyperlipidemia, unspecified: Secondary | ICD-10-CM | POA: Diagnosis not present

## 2020-01-04 DIAGNOSIS — Z803 Family history of malignant neoplasm of breast: Secondary | ICD-10-CM

## 2020-01-04 DIAGNOSIS — R140 Abdominal distension (gaseous): Secondary | ICD-10-CM | POA: Diagnosis not present

## 2020-01-04 DIAGNOSIS — Z7902 Long term (current) use of antithrombotics/antiplatelets: Secondary | ICD-10-CM

## 2020-01-04 DIAGNOSIS — R069 Unspecified abnormalities of breathing: Secondary | ICD-10-CM | POA: Diagnosis not present

## 2020-01-04 DIAGNOSIS — E538 Deficiency of other specified B group vitamins: Secondary | ICD-10-CM | POA: Diagnosis present

## 2020-01-04 DIAGNOSIS — D62 Acute posthemorrhagic anemia: Secondary | ICD-10-CM | POA: Diagnosis present

## 2020-01-04 DIAGNOSIS — Z825 Family history of asthma and other chronic lower respiratory diseases: Secondary | ICD-10-CM | POA: Diagnosis not present

## 2020-01-04 DIAGNOSIS — Z20822 Contact with and (suspected) exposure to covid-19: Secondary | ICD-10-CM | POA: Diagnosis present

## 2020-01-04 DIAGNOSIS — K7689 Other specified diseases of liver: Secondary | ICD-10-CM | POA: Diagnosis not present

## 2020-01-04 DIAGNOSIS — M1990 Unspecified osteoarthritis, unspecified site: Secondary | ICD-10-CM | POA: Diagnosis present

## 2020-01-04 DIAGNOSIS — R0602 Shortness of breath: Secondary | ICD-10-CM | POA: Diagnosis not present

## 2020-01-04 DIAGNOSIS — J90 Pleural effusion, not elsewhere classified: Secondary | ICD-10-CM

## 2020-01-04 DIAGNOSIS — K317 Polyp of stomach and duodenum: Secondary | ICD-10-CM | POA: Diagnosis present

## 2020-01-04 DIAGNOSIS — Z882 Allergy status to sulfonamides status: Secondary | ICD-10-CM | POA: Diagnosis not present

## 2020-01-04 DIAGNOSIS — E871 Hypo-osmolality and hyponatremia: Secondary | ICD-10-CM | POA: Diagnosis present

## 2020-01-04 DIAGNOSIS — J9811 Atelectasis: Secondary | ICD-10-CM | POA: Diagnosis not present

## 2020-01-04 DIAGNOSIS — D500 Iron deficiency anemia secondary to blood loss (chronic): Secondary | ICD-10-CM | POA: Diagnosis not present

## 2020-01-04 DIAGNOSIS — I252 Old myocardial infarction: Secondary | ICD-10-CM

## 2020-01-04 DIAGNOSIS — R0902 Hypoxemia: Secondary | ICD-10-CM | POA: Diagnosis not present

## 2020-01-04 LAB — CBC WITH DIFFERENTIAL/PLATELET
Abs Immature Granulocytes: 0.07 10*3/uL (ref 0.00–0.07)
Basophils Absolute: 0.1 10*3/uL (ref 0.0–0.1)
Basophils Relative: 1 %
Eosinophils Absolute: 0.1 10*3/uL (ref 0.0–0.5)
Eosinophils Relative: 1 %
HCT: 24.6 % — ABNORMAL LOW (ref 36.0–46.0)
Hemoglobin: 8.1 g/dL — ABNORMAL LOW (ref 12.0–15.0)
Immature Granulocytes: 1 %
Lymphocytes Relative: 36 %
Lymphs Abs: 5 10*3/uL — ABNORMAL HIGH (ref 0.7–4.0)
MCH: 32 pg (ref 26.0–34.0)
MCHC: 32.9 g/dL (ref 30.0–36.0)
MCV: 97.2 fL (ref 80.0–100.0)
Monocytes Absolute: 0.9 10*3/uL (ref 0.1–1.0)
Monocytes Relative: 6 %
Neutro Abs: 7.9 10*3/uL — ABNORMAL HIGH (ref 1.7–7.7)
Neutrophils Relative %: 55 %
Platelets: 368 10*3/uL (ref 150–400)
RBC: 2.53 MIL/uL — ABNORMAL LOW (ref 3.87–5.11)
RDW: 15.5 % (ref 11.5–15.5)
WBC: 14 10*3/uL — ABNORMAL HIGH (ref 4.0–10.5)
nRBC: 0.1 % (ref 0.0–0.2)

## 2020-01-04 LAB — COMPREHENSIVE METABOLIC PANEL
ALT: 11 U/L (ref 0–44)
AST: 18 U/L (ref 15–41)
Albumin: 3.4 g/dL — ABNORMAL LOW (ref 3.5–5.0)
Alkaline Phosphatase: 83 U/L (ref 38–126)
Anion gap: 6 (ref 5–15)
BUN: 12 mg/dL (ref 8–23)
CO2: 25 mmol/L (ref 22–32)
Calcium: 8.2 mg/dL — ABNORMAL LOW (ref 8.9–10.3)
Chloride: 87 mmol/L — ABNORMAL LOW (ref 98–111)
Creatinine, Ser: 0.61 mg/dL (ref 0.44–1.00)
GFR calc Af Amer: >60 mL/min (ref >60)
GFR calc non Af Amer: >60 mL/min (ref >60)
Glucose, Bld: 120 mg/dL — ABNORMAL HIGH (ref 70–99)
Potassium: 4.5 mmol/L (ref 3.5–5.1)
Sodium: 118 mmol/L — CL (ref 135–145)
Total Bilirubin: 1.3 mg/dL — ABNORMAL HIGH (ref 0.3–1.2)
Total Protein: 6.2 g/dL — ABNORMAL LOW (ref 6.5–8.1)

## 2020-01-04 LAB — TROPONIN I (HIGH SENSITIVITY)
Troponin I (High Sensitivity): 13 ng/L (ref <18)
Troponin I (High Sensitivity): 14 ng/L (ref <18)

## 2020-01-04 LAB — IRON AND TIBC
Iron: 72 ug/dL (ref 28–170)
Saturation Ratios: 16 % (ref 10.4–31.8)
TIBC: 456 ug/dL — ABNORMAL HIGH (ref 250–450)
UIBC: 384 ug/dL

## 2020-01-04 LAB — HEMOGLOBIN AND HEMATOCRIT, BLOOD
HCT: 31.3 % — ABNORMAL LOW (ref 36.0–46.0)
Hemoglobin: 10.8 g/dL — ABNORMAL LOW (ref 12.0–15.0)

## 2020-01-04 LAB — FERRITIN: Ferritin: 95 ng/mL (ref 11–307)

## 2020-01-04 LAB — FOLATE: Folate: 20.8 ng/mL (ref >5.9)

## 2020-01-04 LAB — BRAIN NATRIURETIC PEPTIDE: B Natriuretic Peptide: 2472.3 pg/mL — ABNORMAL HIGH (ref 0.0–100.0)

## 2020-01-04 LAB — VITAMIN B12: Vitamin B-12: 131 pg/mL — ABNORMAL LOW (ref 180–914)

## 2020-01-04 LAB — PREPARE RBC (CROSSMATCH)

## 2020-01-04 LAB — SARS CORONAVIRUS 2 BY RT PCR (HOSPITAL ORDER, PERFORMED IN ~~LOC~~ HOSPITAL LAB): SARS Coronavirus 2: NEGATIVE

## 2020-01-04 MED ORDER — FUROSEMIDE 10 MG/ML IJ SOLN
40.0000 mg | Freq: Once | INTRAMUSCULAR | Status: AC
  Administered 2020-01-04: 40 mg via INTRAVENOUS
  Filled 2020-01-04: qty 4

## 2020-01-04 MED ORDER — FLUTICASONE PROPIONATE 50 MCG/ACT NA SUSP
1.0000 | Freq: Every day | NASAL | Status: DC
  Administered 2020-01-05 – 2020-01-06 (×2): 1 via NASAL
  Filled 2020-01-04: qty 16

## 2020-01-04 MED ORDER — OCUVITE-LUTEIN PO CAPS
ORAL_CAPSULE | Freq: Two times a day (BID) | ORAL | Status: DC
  Administered 2020-01-05 – 2020-01-06 (×3): 1 via ORAL
  Filled 2020-01-04 (×5): qty 1

## 2020-01-04 MED ORDER — CARVEDILOL 12.5 MG PO TABS
12.5000 mg | ORAL_TABLET | Freq: Two times a day (BID) | ORAL | Status: DC
  Administered 2020-01-04 – 2020-01-06 (×5): 12.5 mg via ORAL
  Filled 2020-01-04: qty 2
  Filled 2020-01-04: qty 1
  Filled 2020-01-04: qty 2
  Filled 2020-01-04 (×2): qty 1

## 2020-01-04 MED ORDER — ATORVASTATIN CALCIUM 20 MG PO TABS
40.0000 mg | ORAL_TABLET | Freq: Every day | ORAL | Status: DC
  Administered 2020-01-04 – 2020-01-05 (×2): 40 mg via ORAL
  Filled 2020-01-04 (×2): qty 2

## 2020-01-04 MED ORDER — IRBESARTAN 150 MG PO TABS
150.0000 mg | ORAL_TABLET | ORAL | Status: DC
  Administered 2020-01-05 – 2020-01-06 (×2): 150 mg via ORAL
  Filled 2020-01-04 (×2): qty 1

## 2020-01-04 MED ORDER — FUROSEMIDE 10 MG/ML IJ SOLN
40.0000 mg | Freq: Two times a day (BID) | INTRAMUSCULAR | Status: DC
  Administered 2020-01-04 – 2020-01-06 (×5): 40 mg via INTRAVENOUS
  Filled 2020-01-04 (×5): qty 4

## 2020-01-04 MED ORDER — TRAMADOL HCL 50 MG PO TABS
25.0000 mg | ORAL_TABLET | Freq: Two times a day (BID) | ORAL | Status: DC
  Administered 2020-01-04 – 2020-01-06 (×4): 25 mg via ORAL
  Filled 2020-01-04 (×5): qty 1

## 2020-01-04 MED ORDER — SODIUM CHLORIDE 0.9 % IV SOLN: 250.0000 mL | INTRAVENOUS | Status: DC | PRN

## 2020-01-04 MED ORDER — OXYCODONE HCL 5 MG PO TABS: 5.0000 mg | ORAL_TABLET | ORAL | Status: DC | PRN

## 2020-01-04 MED ORDER — METOCLOPRAMIDE HCL 5 MG/ML IJ SOLN
10.0000 mg | Freq: Once | INTRAMUSCULAR | Status: AC
  Administered 2020-01-04: 10 mg via INTRAVENOUS
  Filled 2020-01-04: qty 2

## 2020-01-04 MED ORDER — ARTIFICIAL TEARS OPHTHALMIC OINT
1.0000 "application " | TOPICAL_OINTMENT | Freq: Every day | OPHTHALMIC | Status: DC
  Administered 2020-01-05 (×2): 1 via OPHTHALMIC
  Filled 2020-01-04: qty 3.5

## 2020-01-04 MED ORDER — SODIUM CHLORIDE 0.9 % IV SOLN
10.0000 mL/h | Freq: Once | INTRAVENOUS | Status: AC
  Administered 2020-01-04: 10 mL/h via INTRAVENOUS

## 2020-01-04 MED ORDER — PANTOPRAZOLE SODIUM 40 MG IV SOLR
40.0000 mg | INTRAVENOUS | Status: DC
  Administered 2020-01-04: 40 mg via INTRAVENOUS
  Filled 2020-01-04: qty 40

## 2020-01-04 MED ORDER — POTASSIUM CHLORIDE CRYS ER 10 MEQ PO TBCR
10.0000 meq | EXTENDED_RELEASE_TABLET | Freq: Every day | ORAL | Status: DC
  Administered 2020-01-06: 10 meq via ORAL
  Filled 2020-01-04 (×2): qty 1

## 2020-01-04 MED ORDER — SODIUM CHLORIDE 0.9% FLUSH
3.0000 mL | Freq: Two times a day (BID) | INTRAVENOUS | Status: DC
  Administered 2020-01-04 – 2020-01-06 (×5): 3 mL via INTRAVENOUS

## 2020-01-04 MED ORDER — ONDANSETRON HCL 4 MG/2ML IJ SOLN: 4.0000 mg | Freq: Four times a day (QID) | INTRAMUSCULAR | Status: DC | PRN

## 2020-01-04 MED ORDER — IOHEXOL 300 MG/ML  SOLN
100.0000 mL | Freq: Once | INTRAMUSCULAR | Status: AC | PRN
  Administered 2020-01-04: 100 mL via INTRAVENOUS

## 2020-01-04 MED ORDER — LORATADINE 10 MG PO TABS
10.0000 mg | ORAL_TABLET | Freq: Every day | ORAL | Status: DC
  Administered 2020-01-04 – 2020-01-06 (×3): 10 mg via ORAL
  Filled 2020-01-04 (×3): qty 1

## 2020-01-04 MED ORDER — SODIUM CHLORIDE 0.9% FLUSH: 3.0000 mL | INTRAVENOUS | Status: DC | PRN

## 2020-01-04 MED ORDER — PANTOPRAZOLE SODIUM 40 MG IV SOLR
40.0000 mg | Freq: Two times a day (BID) | INTRAVENOUS | Status: DC
  Administered 2020-01-04 – 2020-01-06 (×4): 40 mg via INTRAVENOUS
  Filled 2020-01-04 (×4): qty 40

## 2020-01-04 MED ORDER — ACETAMINOPHEN 325 MG PO TABS
650.0000 mg | ORAL_TABLET | ORAL | Status: DC | PRN
  Administered 2020-01-04 – 2020-01-06 (×4): 650 mg via ORAL
  Filled 2020-01-04 (×4): qty 2

## 2020-01-04 MED ORDER — AZELASTINE HCL 0.1 % NA SOLN
1.0000 | Freq: Every day | NASAL | Status: DC
  Administered 2020-01-05 (×2): 1 via NASAL
  Filled 2020-01-04: qty 30

## 2020-01-04 MED ORDER — ZOLPIDEM TARTRATE 5 MG PO TABS: 5.0000 mg | ORAL_TABLET | Freq: Every evening | ORAL | Status: DC | PRN

## 2020-01-04 MED ORDER — ALPRAZOLAM 0.25 MG PO TABS: 0.2500 mg | ORAL_TABLET | Freq: Two times a day (BID) | ORAL | Status: DC | PRN
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
## 2020-01-04 NOTE — ED Notes (Signed)
Report called to nurse Flo, RN receiving patient to room 255. Ticket for transport sent. Awaiting transport.

## 2020-01-04 NOTE — ED Triage Notes (Signed)
Pt to the er via ems for nausea, distension and sob. Pt is 2 weeks post op LK replacement. Pt taking antiinflammatory and zofran. Pt doing PT with a walker and doing well. Pt denies chest pain. No abd pain with palpation. Pt sats 90 to 92 on room air. Pt placed on 2L and sats increase to 98%. NSR on 12 lead.

## 2020-01-04 NOTE — ED Notes (Signed)
BLOOD CONSENT OBTAINED AT THIS TIME, SIGNATURE PAD IN ROOM NOT WORKING
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
## 2020-01-04 NOTE — ED Notes (Signed)
Date and time results received: 01/04/20 0359 (use smartphrase ".now" to insert current time)  Test: sodium Critical Value: 118  Name of Provider Notified: brown  Orders Received? Or Actions Taken?: orders

## 2020-01-04 NOTE — ED Notes (Signed)
Attempted to call report to nurse receiving pt to room 255. Awaiting call back to give report.

## 2020-01-05 ENCOUNTER — Encounter
Admission: EM | Disposition: A | Discharge status: Home Health | Payer: Self-pay | Source: Home / Self Care | Attending: Internal Medicine

## 2020-01-05 ENCOUNTER — Inpatient Hospital Stay: Payer: Medicare Other | Admitting: Anesthesiology

## 2020-01-05 DIAGNOSIS — D62 Acute posthemorrhagic anemia: Secondary | ICD-10-CM

## 2020-01-05 HISTORY — PX: ESOPHAGOGASTRODUODENOSCOPY (EGD) WITH PROPOFOL: SHX5813

## 2020-01-05 LAB — BPAM RBC
Blood Product Expiration Date: 202108142359
Blood Product Expiration Date: 202108142359
Blood Product Expiration Date: 202108162359
ISSUE DATE / TIME: 202107220542
ISSUE DATE / TIME: 202107221039
Unit Type and Rh: 5100
Unit Type and Rh: 9500
Unit Type and Rh: 9500

## 2020-01-05 LAB — TYPE AND SCREEN
ABO/RH(D): O NEG
Antibody Screen: NEGATIVE
Unit division: 0
Unit division: 0
Unit division: 0

## 2020-01-05 LAB — HEMOGLOBIN AND HEMATOCRIT, BLOOD
HCT: 33.3 % — ABNORMAL LOW (ref 36.0–46.0)
Hemoglobin: 11.1 g/dL — ABNORMAL LOW (ref 12.0–15.0)

## 2020-01-05 LAB — BASIC METABOLIC PANEL
Anion gap: 11 (ref 5–15)
BUN: 17 mg/dL (ref 8–23)
CO2: 25 mmol/L (ref 22–32)
Calcium: 8.7 mg/dL — ABNORMAL LOW (ref 8.9–10.3)
Chloride: 89 mmol/L — ABNORMAL LOW (ref 98–111)
Creatinine, Ser: 0.9 mg/dL (ref 0.44–1.00)
GFR calc Af Amer: >60 mL/min (ref >60)
GFR calc non Af Amer: 60 mL/min — ABNORMAL LOW (ref >60)
Glucose, Bld: 111 mg/dL — ABNORMAL HIGH (ref 70–99)
Potassium: 3.8 mmol/L (ref 3.5–5.1)
Sodium: 125 mmol/L — ABNORMAL LOW (ref 135–145)

## 2020-01-05 SURGERY — ESOPHAGOGASTRODUODENOSCOPY (EGD) WITH PROPOFOL
Anesthesia: General

## 2020-01-05 MED ORDER — SODIUM CHLORIDE 0.9 % IV SOLN: INTRAVENOUS | Status: DC

## 2020-01-05 MED ORDER — PROPOFOL 10 MG/ML IV BOLUS
INTRAVENOUS | Status: DC | PRN
  Administered 2020-01-05: 50 mg via INTRAVENOUS

## 2020-01-05 MED ORDER — LIDOCAINE HCL (CARDIAC) PF 100 MG/5ML IV SOSY
PREFILLED_SYRINGE | INTRAVENOUS | Status: DC | PRN
  Administered 2020-01-05: 20 mg via INTRAVENOUS

## 2020-01-05 MED ORDER — LIDOCAINE HCL (PF) 2 % IJ SOLN
INTRAMUSCULAR | Status: AC
  Filled 2020-01-05: qty 5

## 2020-01-05 MED ORDER — PROPOFOL 500 MG/50ML IV EMUL
INTRAVENOUS | Status: AC
  Filled 2020-01-05: qty 50

## 2020-01-05 MED ORDER — PROPOFOL 500 MG/50ML IV EMUL
INTRAVENOUS | Status: DC | PRN
  Administered 2020-01-05: 100 ug/kg/min via INTRAVENOUS

## 2020-01-05 MED ORDER — VITAMIN B-12 1000 MCG PO TABS
1000.0000 ug | ORAL_TABLET | Freq: Every day | ORAL | Status: DC
  Administered 2020-01-05 – 2020-01-06 (×2): 1000 ug via ORAL
  Filled 2020-01-05 (×3): qty 1

## 2020-01-05 NOTE — Op Note (Signed)
[facility] Gastroenterology Patient Name: Teresa Moyer Procedure Date: 01/05/2020 2:29 PM MRN: [id number] Account #: [id number] Date of Birth: 1936/05/17 Admit Type: Inpatient Age: 84 Room: [facility] ENDO ROOM 2 Gender: Female Note Status: Finalized Procedure:             Upper GI endoscopy Indications:           Unexplained iron deficiency anemia Providers:             Lin Landsman MD, MD Medicines:             Monitored Anesthesia Care Complications:         No immediate complications. Estimated blood loss: None. Procedure:             Pre-Anesthesia Assessment:                        - Prior to the procedure, a History and Physical was                         performed, and patient medications and allergies were                         reviewed. The patient is competent. The risks and                         benefits of the procedure and the sedation options and                         risks were discussed with the patient. All questions                         were answered and informed consent was obtained.                         Patient identification and proposed procedure were                         verified by the physician, the nurse, the                         anesthesiologist, the anesthetist and the technician                         in the pre-procedure area in the procedure room in the                         endoscopy suite. Mental Status Examination: alert and                         oriented. Airway Examination: normal oropharyngeal                         airway and neck mobility. Respiratory Examination:                         clear to auscultation. CV Examination: normal.                         Prophylactic Antibiotics: The patient does  not require                         prophylactic antibiotics. Prior Anticoagulants: The                         patient has taken no previous anticoagulant or                         antiplatelet agents.  ASA Grade Assessment: III - A                         patient with severe systemic disease. After reviewing                         the risks and benefits, the patient was deemed in                         satisfactory condition to undergo the procedure. The                         anesthesia plan was to use monitored anesthesia care                         (MAC). Immediately prior to administration of                         medications, the patient was re-assessed for adequacy                         to receive sedatives. The heart rate, respiratory                         rate, oxygen saturations, blood pressure, adequacy of                         pulmonary ventilation, and response to care were                         monitored throughout the procedure. The physical                         status of the patient was re-assessed after the                         procedure.                        After obtaining informed consent, the endoscope was                         passed under direct vision. Throughout the procedure,                         the patient's blood pressure, pulse, and oxygen                         saturations were monitored continuously. The Endoscope  was introduced through the mouth, and advanced to the                         second part of duodenum. The upper GI endoscopy was                         accomplished without difficulty. The patient tolerated                         the procedure well. Findings:      The duodenal bulb and second portion of the duodenum were normal.      Multiple dispersed small erosions with no bleeding and no stigmata of       recent bleeding were found on the greater curvature of the gastric body       and in the prepyloric region of the stomach. Biopsies were taken with a       cold forceps for Helicobacter pylori testing.      Multiple small sessile polyps with no bleeding and no stigmata of recent        bleeding were found in the gastric body.      The cardia and gastric fundus were normal on retroflexion.      Esophagogastric landmarks were identified: the gastroesophageal junction       was found at 39 cm from the incisors.      The gastroesophageal junction and examined esophagus were normal. Impression:            - Normal duodenal bulb and second portion of the                         duodenum.                        - Erosive gastropathy with no bleeding and no stigmata                         of recent bleeding. Biopsied.                        - Multiple gastric polyps.                        - Esophagogastric landmarks identified.                        - Normal gastroesophageal junction and esophagus. Recommendation:        - Return patient to hospital ward for ongoing care.                        - Cardiac diet today.                        - Continue present medications.                        - Use Prilosec (omeprazole) 40 mg PO daily.                        - Return to GI office in 6 weeks. Procedure Code(s):     --- Professional ---  75643, Esophagogastroduodenoscopy, flexible,                         transoral; with biopsy, single or multiple Diagnosis Code(s):     --- Professional ---                        K31.89, Other diseases of stomach and duodenum                        K31.7, Polyp of stomach and duodenum                        D50.9, Iron deficiency anemia, unspecified CPT copyright 2019 American Medical Association. All rights reserved. The codes documented in this report are preliminary and upon coder review may  be revised to meet current compliance requirements. Dr. Ulyess Mort Lin Landsman MD, MD 01/05/2020 2:48:25 PM This report has been signed electronically. Number of Addenda: 0 Note Initiated On: 01/05/2020 2:29 PM Estimated Blood Loss:  Estimated blood loss: none.      [facility]

## 2020-01-05 NOTE — Progress Notes (Signed)
PROGRESS NOTE    Teresa Moyer  NAT:[id number] DOB: 11-11-1935 DOA: 01/04/2020 PCP: Jerrol Banana., MD    Brief Narrative:  84 y.o. female with history of congestive heart failure, ejection fraction by echo read on July 14 of 2020 showed ejection fraction of 45 to 50%, coronary disease status post PCI of the LAD and July 2020 with an Onyx 3.0 x 18 mm stent with noncritical disease elsewhere who presented to emergency room with dyspnea, nausea and vomiting.  She ruled out for myocardial infarction with high-sensitivity troponins of 13 and 14.  BNP was 2472 with a hemoglobin of 8.1 down from 13.92 weeks previously.  She is being scheduled for upper endoscopy.  Her sodium was 118.  Chest x-ray showed fluid overload interstitial prominence. Admitted with GI consultation and cardiology consultation.   Assessment & Plan:   Principal Problem:   Acute on chronic combined systolic and diastolic CHF (congestive heart failure) (HCC) Active Problems:   Hypertension   Gastro-esophageal reflux disease without esophagitis   Coronary artery disease involving native coronary artery of native heart   Acute blood loss anemia   Hyponatremia with excess extracellular fluid volume  Acute on chronic combined systolic and diastolic congestive heart failure: Hypervolemic hyponatremia. Treated with IV Lasix 40 mg twice a day, clinically improving.  -2 L balance. Already on irbesartan and carvedilol. Continue IV Lasix today.  Intake and output monitoring.  Appreciate cardiology input. Sodium level gradually improving as anticipated from diuresis.  We will continue to monitor.  Recheck tomorrow morning.  Acute blood loss anemia: Suspect upper GI bleeding with intake of antiplatelet therapy including Plavix. Hemoglobin 8.1-2 units of PRBC-appropriately responded to 11.  We will recheck tomorrow morning. Remains on IV Protonix twice daily.  Medically stabilized. Followed by GI.  Going for upper GI  endoscopy today.  Coronary artery disease with history of STEMI 12/27/2018: No evidence of acute coronary syndrome.  On Plavix.  Holding today.  Fairly stable on current regimen including carvedilol, irbesartan, atorvastatin.  GERD: On PPI.  Continue.  Currently on IV.  Patient care discussed with cardiology and gastroenterology.  DVT prophylaxis: SCDs Start: 01/04/20 4270   Code Status: Full code Family Communication: Husband at bedside Disposition Plan: Status is: Inpatient  Remains inpatient appropriate because:IV treatments appropriate due to intensity of illness or inability to take PO and Inpatient level of care appropriate due to severity of illness   Dispo: The patient is from: Home              Anticipated d/c is to: Home              Anticipated d/c date is: 2 days              Patient currently is not medically stable to d/c.         Consultants:   Cardiology  Gastroenterology  Procedures:   Upper GI endoscopy, going today  Antimicrobials:   None   Subjective: Patient seen and examined.  She denied any complaints.  Denies any chest pain or shortness of breath.  Her dyspnea improved overnight with diuresis.  Husband was at the bedside.  Received total 2 units of PRBC overnight.  Looking forward to go to endoscopy.  Objective: Vitals:   01/04/20 2242 01/05/20 0253 01/05/20 0730 01/05/20 1123  BP: (!) 163/72 (!) 176/87 (!) 155/70 (!) 130/63  Pulse: 88 84 76 67  Resp: 16 16 18 18   Temp: 98 F (36.7 C)  98.9 F (37.2 C) 98.4 F (36.9 C) 98.2 F (36.8 C)  TempSrc:  Oral Oral Oral  SpO2: 95% 94% 92% 93%  Weight:  68.8 kg    Height:        Intake/Output Summary (Last 24 hours) at 01/05/2020 1412 Last data filed at 01/05/2020 1230 Gross per 24 hour  Intake 0 ml  Output 3200 ml  Net -3200 ml   Filed Weights   01/04/20 0157 01/05/20 0253  Weight: 62.6 kg 68.8 kg    Examination:  General exam: Appears calm and comfortable  Not in any distress.   On room air. Respiratory system: Occasional basal crackles mostly in the posterior base.  No other added sounds. Cardiovascular system: S1 & S2 heard, RRR. No pedal edema. Gastrointestinal system: Abdomen is nondistended, soft and nontender. No organomegaly or masses felt. Normal bowel sounds heard. Central nervous system: Alert and oriented. No focal neurological deficits. Extremities: Symmetric 5 x 5 power. Skin: No rashes, lesions or ulcers Psychiatry: Judgement and insight appear normal. Mood & affect appropriate.     Data Reviewed: I have personally reviewed following labs and imaging studies  CBC: Recent Labs  Lab 01/04/20 0148 01/04/20 1608 01/05/20 0040  WBC 14.0*  --   --   NEUTROABS 7.9*  --   --   HGB 8.1* 10.8* 11.1*  HCT 24.6* 31.3* 33.3*  MCV 97.2  --   --   PLT 368  --   --    Basic Metabolic Panel: Recent Labs  Lab 01/04/20 0148 01/05/20 0040  NA 118* 125*  K 4.5 3.8  CL 87* 89*  CO2 25 25  GLUCOSE 120* 111*  BUN 12 17  CREATININE 0.61 0.90  CALCIUM 8.2* 8.7*   GFR: Estimated Creatinine Clearance: 44.4 mL/min (by C-G formula based on SCr of 0.9 mg/dL). Liver Function Tests: Recent Labs  Lab 01/04/20 0148  AST 18  ALT 11  ALKPHOS 83  BILITOT 1.3*  PROT 6.2*  ALBUMIN 3.4*   No results for input(s): LIPASE, AMYLASE in the last 168 hours. No results for input(s): AMMONIA in the last 168 hours. Coagulation Profile: No results for input(s): INR, PROTIME in the last 168 hours. Cardiac Enzymes: No results for input(s): CKTOTAL, CKMB, CKMBINDEX, TROPONINI in the last 168 hours. BNP (last 3 results) No results for input(s): PROBNP in the last 8760 hours. HbA1C: No results for input(s): HGBA1C in the last 72 hours. CBG: No results for input(s): GLUCAP in the last 168 hours. Lipid Profile: No results for input(s): CHOL, HDL, LDLCALC, TRIG, CHOLHDL, LDLDIRECT in the last 72 hours. Thyroid Function Tests: No results for input(s): TSH, T4TOTAL,  FREET4, T3FREE, THYROIDAB in the last 72 hours. Anemia Panel: Recent Labs    01/04/20 1530 01/04/20 1608  VITAMINB12 131*  --   FOLATE  --  20.8  FERRITIN  --  95  TIBC  --  456*  IRON  --  72   Sepsis Labs: No results for input(s): PROCALCITON, LATICACIDVEN in the last 168 hours.  Recent Results (from the past 240 hour(s))  SARS Coronavirus 2 by RT PCR (hospital order, performed in [facility] hospital lab) Nasopharyngeal Nasopharyngeal Swab     Status: None   Collection Time: 01/04/20  2:50 AM   Specimen: Nasopharyngeal Swab  Result Value Ref Range Status   SARS Coronavirus 2 NEGATIVE NEGATIVE Final    Comment: (NOTE) SARS-CoV-2 target nucleic acids are NOT DETECTED.  The SARS-CoV-2 RNA is generally detectable in  upper and lower respiratory specimens during the acute phase of infection. The lowest concentration of SARS-CoV-2 viral copies this assay can detect is 250 copies / mL. A negative result does not preclude SARS-CoV-2 infection and should not be used as the sole basis for treatment or other patient management decisions.  A negative result may occur with improper specimen collection / handling, submission of specimen other than nasopharyngeal swab, presence of viral mutation(s) within the areas targeted by this assay, and inadequate number of viral copies (<250 copies / mL). A negative result must be combined with clinical observations, patient history, and epidemiological information.  Fact Sheet for Patients:   StrictlyIdeas.no  Fact Sheet for Healthcare Providers: BankingDealers.co.za  This test is not yet approved or  cleared by the [place] FDA and has been authorized for detection and/or diagnosis of SARS-CoV-2 by FDA under an Emergency Use Authorization (EUA).  This EUA will remain in effect (meaning this test can be used) for the duration of the COVID-19 declaration under Section 564(b)(1) of the Act, 21  U.S.C. section 360bbb-3(b)(1), unless the authorization is terminated or revoked sooner.  Performed at [facility], [address] [place], [place] [address]          Radiology Studies: CT ABDOMEN PELVIS W CONTRAST  Result Date: 01/04/2020 CLINICAL DATA:  Nausea and vomiting. Two weeks status post total left hip arthroplasty. EXAM: CT ABDOMEN AND PELVIS WITH CONTRAST TECHNIQUE: Multidetector CT imaging of the abdomen and pelvis was performed using the standard protocol following bolus administration of intravenous contrast. CONTRAST:  163mL OMNIPAQUE IOHEXOL 300 MG/ML  SOLN COMPARISON:  CT scan 12/09/2017 FINDINGS: Lower chest: Moderate-sized bilateral pleural effusions are noted with bibasilar atelectasis. The heart is mildly enlarged. No pericardial effusion. Hepatobiliary: Stable benign-appearing right hepatic lobe cyst. No worrisome hepatic lesions. Intra and extrahepatic biliary dilatation is progressive since 2019 and some of this may be due to cholecystectomy. No obvious common bile duct stones. Recommend correlation with liver function studies. If these are abnormal MRCP may be helpful for further evaluation. The portal and hepatic veins are patent. Pancreas: No mass, inflammation or ductal dilatation. Spleen: Normal size. Small scattered low-attenuation lesions are stable and likely benign cysts. Adrenals/Urinary Tract: Adrenal glands and kidneys are unremarkable. No worrisome renal lesions or renal calculi. Mild bilateral hydroureteronephrosis probably due to a moderately distended bladder. Stomach/Bowel: The stomach, duodenum, small bowel and colon are grossly normal without oral contrast. No acute inflammatory changes, mass lesions or obstructive findings. Vascular/Lymphatic: Scattered atherosclerotic calcifications but no aneurysm or dissection. The branch vessels are patent. The major venous structures are patent. Small scattered mesenteric and retroperitoneal lymph nodes  appears stable. Reproductive: Surgically absent. Other: Very small amount of free pelvic fluid. No pelvic mass or adenopathy. No inguinal mass or adenopathy. Diffuse body wall edema is noted. Musculoskeletal: Asymmetric subcutaneous edema or interstitial changes involving the left upper thigh. Suspect mild myositis also. This could be related to the patient's recent left knee surgery. No acute or significant bony findings. Age related osteopenia. IMPRESSION: 1. Moderate-sized bilateral pleural effusions with bibasilar atelectasis. 2. Intra and extrahepatic biliary dilatation is progressive since 2019 and some of this may be due to prior cholecystectomy. Recommend correlation with liver function studies. If these are abnormal MRCP may be helpful for further evaluation. 3. No acute abdominal/pelvic findings, mass lesions or adenopathy. 4. Asymmetric subcutaneous edema or interstitial changes involving the left upper thigh could be related to the patient's recent left knee surgery. Suspect mild  myositis also. 5. Mild bilateral hydroureteronephrosis probably due to a moderately distended bladder. Electronically Signed   By: Marijo Sanes M.D.   On: 01/04/2020 05:47   DG Chest Port 1 View  Result Date: 01/04/2020 CLINICAL DATA:  Dyspnea EXAM: PORTABLE CHEST 1 VIEW COMPARISON:  January 30, 2019 FINDINGS: There is cardiomegaly with moderate-sized bilateral pleural effusions and findings of congestive heart failure. There are areas of atelectasis bilaterally. There is no pneumothorax. No acute osseous abnormality. IMPRESSION: Congestive heart failure with moderate-sized bilateral pleural effusions. Electronically Signed   By: Constance Holster M.D.   On: 01/04/2020 02:27        Scheduled Meds:  [MAR Hold] artificial tears  1 application Both Eyes QHS   [MAR Hold] atorvastatin  40 mg Oral q1800   [MAR Hold] azelastine  1 spray Each Nare QHS   [MAR Hold] carvedilol  12.5 mg Oral BID WC   [MAR Hold]  fluticasone  1 spray Each Nare Daily   [MAR Hold] furosemide  40 mg Intravenous Q12H   [MAR Hold] irbesartan  150 mg Oral BH-q7a   [MAR Hold] loratadine  10 mg Oral Daily   [MAR Hold] multivitamin-lutein   Oral BID   [MAR Hold] pantoprazole (PROTONIX) IV  40 mg Intravenous Q12H   [MAR Hold] potassium chloride  10 mEq Oral Daily   [MAR Hold] sodium chloride flush  3 mL Intravenous Q12H   [MAR Hold] traMADol  25 mg Oral BID   Continuous Infusions:  [MAR Hold] sodium chloride     sodium chloride 20 mL/hr at 01/05/20 1255     LOS: 1 day    Time spent: 30 minutes    Barb Merino, MD [facility] Pager [phone]
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
## 2020-01-05 NOTE — Transfer of Care (Signed)
Immediate Anesthesia Transfer of Care Note  Patient: Teresa Moyer  Procedure(s) Performed: ESOPHAGOGASTRODUODENOSCOPY (EGD) WITH PROPOFOL (N/A )  Patient Location: PACU and Endoscopy Unit  Anesthesia Type:General  Level of Consciousness: sedated  Airway & Oxygen Therapy: Patient connected to nasal cannula oxygen  Post-op Assessment: Report given to RN  Post vital signs: stable  Last Vitals:  Vitals Value Taken Time  BP 101/47 01/05/20 1451  Temp 36.6 C 01/05/20 1451  Pulse 61 01/05/20 1451  Resp 14 01/05/20 1451  SpO2 92 % 01/05/20 1451  Vitals shown include unvalidated device data.  Last Pain:  Vitals:   01/05/20 1451  TempSrc: Temporal  PainSc:          Complications: No complications documented.
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
## 2020-01-06 LAB — BASIC METABOLIC PANEL
Anion gap: 10 (ref 5–15)
BUN: 19 mg/dL (ref 8–23)
CO2: 26 mmol/L (ref 22–32)
Calcium: 8.4 mg/dL — ABNORMAL LOW (ref 8.9–10.3)
Chloride: 95 mmol/L — ABNORMAL LOW (ref 98–111)
Creatinine, Ser: 0.84 mg/dL (ref 0.44–1.00)
GFR calc Af Amer: >60 mL/min (ref >60)
GFR calc non Af Amer: >60 mL/min (ref >60)
Glucose, Bld: 97 mg/dL (ref 70–99)
Potassium: 3.5 mmol/L (ref 3.5–5.1)
Sodium: 131 mmol/L — ABNORMAL LOW (ref 135–145)

## 2020-01-06 LAB — CBC WITH DIFFERENTIAL/PLATELET
Abs Immature Granulocytes: 0.05 10*3/uL (ref 0.00–0.07)
Basophils Absolute: 0.1 10*3/uL (ref 0.0–0.1)
Basophils Relative: 1 %
Eosinophils Absolute: 0.2 10*3/uL (ref 0.0–0.5)
Eosinophils Relative: 1 %
HCT: 32.8 % — ABNORMAL LOW (ref 36.0–46.0)
Hemoglobin: 11.1 g/dL — ABNORMAL LOW (ref 12.0–15.0)
Immature Granulocytes: 0 %
Lymphocytes Relative: 36 %
Lymphs Abs: 4.2 10*3/uL — ABNORMAL HIGH (ref 0.7–4.0)
MCH: 30.9 pg (ref 26.0–34.0)
MCHC: 33.8 g/dL (ref 30.0–36.0)
MCV: 91.4 fL (ref 80.0–100.0)
Monocytes Absolute: 1 10*3/uL (ref 0.1–1.0)
Monocytes Relative: 8 %
Neutro Abs: 6.3 10*3/uL (ref 1.7–7.7)
Neutrophils Relative %: 54 %
Platelets: 288 10*3/uL (ref 150–400)
RBC: 3.59 MIL/uL — ABNORMAL LOW (ref 3.87–5.11)
RDW: 17.4 % — ABNORMAL HIGH (ref 11.5–15.5)
WBC: 11.7 10*3/uL — ABNORMAL HIGH (ref 4.0–10.5)
nRBC: 0 % (ref 0.0–0.2)

## 2020-01-06 MED ORDER — CYANOCOBALAMIN 1000 MCG PO TABS: 1000.0000 ug | ORAL_TABLET | Freq: Every day | ORAL | 0 refills | Status: DC

## 2020-01-06 MED ORDER — CLOPIDOGREL BISULFATE 75 MG PO TABS: 75.0000 mg | ORAL_TABLET | Freq: Every day | ORAL | Status: DC

## 2020-01-06 NOTE — Evaluation (Signed)
Physical Therapy Evaluation Patient Details Name: VERNON ARIEL MRN: [id number] DOB: [birth year] Today's Date: 01/06/2020   History of Present Illness  Carolyne Whitsel is an 84 y/o female admitted for acute on chronic combine systolic and diastolic CHF. Pt with recent admission to hospital for L TKA perfomred on 7/7. PMH includes chronic systolic heart failure, HTN, CAD, cataracts, hemorrhoids, incontinence, MI x 1 stent 01/2019, and OA.  Clinical Impression  Ms. Stehle was received in bed with HOB elevated and husband at bedside. Pt agreeable to PT evaluation. Pt performed bed mobility with modified independence utilizing bed rail to transition to sitting EOB. Pt performed sit <> stand transfer with supervision and required verbal cues for hand placement. Pt ambulated 150 feet using RW with SBA-CGA for steadying. Pt performed therex seated in recliner chair. Pt educated on importance of continued therex and mobility for joint maintenance. Pt presents with overall decreased strength, endurance, and functional mobility/ROM. Recommend skilled PT to address current deficits and HHPT at discharge to maximize functional mobility/independence and reduce falls risk.     Follow Up Recommendations Home health PT    Equipment Recommendations  None recommended by PT    Recommendations for Other Services       Precautions / Restrictions Precautions Precautions: Fall Restrictions Weight Bearing Restrictions: No      Mobility  Bed Mobility Overal bed mobility: Modified Independent Bed Mobility: Supine to Sit     Supine to sit: Modified independent (Device/Increase time)     General bed mobility comments: pt utilized bedrail when exiting bed to R  Transfers Overall transfer level: Needs assistance Equipment used: Rolling walker (2 wheeled) Transfers: Sit to/from Stand Sit to Stand: Supervision         General transfer comment: supervision for sit to stand transfer from bed to RW; stand  to sit to recliner chair with supervision with verbal cues for hand placement  Ambulation/Gait Ambulation/Gait assistance: Min guard Gait Distance (Feet): 150 Feet Assistive device: Rolling walker (2 wheeled) Gait Pattern/deviations: Step-through pattern;Trunk flexed Gait velocity: 10' in 15"   General Gait Details: pt ambulated 150 feet using RW with intermittent CGA and supervision; pt flexed forward at trunk and required verbal and tactile cues for improved standing posture  Stairs            Wheelchair Mobility    Modified Rankin (Stroke Patients Only)       Balance Overall balance assessment: Needs assistance Sitting-balance support: Feet supported Sitting balance-Leahy Scale: Good Sitting balance - Comments: noted no LOB while sitting with back unsupported EOB   Standing balance support: Bilateral upper extremity supported Standing balance-Leahy Scale: Good Standing balance comment: no over LOB while standing with BUE support on RW                             Pertinent Vitals/Pain Pain Assessment: Faces Faces Pain Scale: Hurts little more Pain Location: L knee Pain Descriptors / Indicators: Discomfort;Grimacing Pain Intervention(s): Monitored during session;Repositioned;Ice applied    Home Living Family/patient expects to be discharged to:: Private residence Living Arrangements: Spouse/significant other Available Help at Discharge: Family;Available 24 hours/day Type of Home: House Home Access: Stairs to enter Entrance Stairs-Rails: Psychiatric nurse of Steps: 7 Home Layout: One level Home Equipment: Walker - 2 wheels;Cane - single point;Walker - 4 wheels;Bedside commode;Tub bench;Grab bars - tub/shower      Prior Function Level of Independence: Needs assistance   Gait / Transfers  Assistance Needed: RW for transfers and ambulation since L TKA on 12/20/2019  ADL's / Homemaking Assistance Needed: Denies needing assistance for  ADLs reporting independence  Comments: Pt reports having macular degeneration and has difficulty seeing certain things and discerning color variances.     Hand Dominance        Extremity/Trunk Assessment   Upper Extremity Assessment Upper Extremity Assessment: Overall WFL for tasks assessed;Generalized weakness (grossly 3+ to 4-/5 bilaterally)    Lower Extremity Assessment Lower Extremity Assessment: Overall WFL for tasks assessed;Generalized weakness (grossly 4-/5 RLE; grossly 3+ to 4-/5 LLE)    Cervical / Trunk Assessment Cervical / Trunk Assessment: Normal  Communication   Communication: No difficulties  Cognition Arousal/Alertness: Awake/alert Behavior During Therapy: WFL for tasks assessed/performed Overall Cognitive Status: Within Functional Limits for tasks assessed                                 General Comments: Pt A&O x 4      General Comments      Exercises Total Joint Exercises Goniometric ROM: LLE AROM: 2-85 degrees Other Exercises Other Exercises: pt performed AP, QS, and hip ab/add x 10 while seated in recliner   Assessment/Plan    PT Assessment Patient needs continued PT services  PT Problem List Decreased strength;Decreased range of motion;Decreased activity tolerance;Decreased balance;Decreased mobility;Pain       PT Treatment Interventions DME instruction;Gait training;Stair training;Functional mobility training;Therapeutic exercise;Therapeutic activities;Balance training;Patient/family education    PT Goals (Current goals can be found in the Care Plan section)  Acute Rehab PT Goals Patient Stated Goal: to get stronger and go home PT Goal Formulation: With patient Time For Goal Achievement: 01/20/20 Potential to Achieve Goals: Good    Frequency Min 2X/week   Barriers to discharge        Co-evaluation               AM-PAC PT "6 Clicks" Mobility  Outcome Measure Help needed turning from your back to your side  while in a flat bed without using bedrails?: None Help needed moving from lying on your back to sitting on the side of a flat bed without using bedrails?: None Help needed moving to and from a bed to a chair (including a wheelchair)?: A Little Help needed standing up from a chair using your arms (e.g., wheelchair or bedside chair)?: A Little Help needed to walk in hospital room?: A Little Help needed climbing 3-5 steps with a railing? : A Little 6 Click Score: 20    End of Session Equipment Utilized During Treatment: Gait belt Activity Tolerance: Patient tolerated treatment well Patient left: in chair;with call bell/phone within reach;with chair alarm set;with family/visitor present Nurse Communication: Mobility status;Other (comment) (purewik needs replaced) PT Visit Diagnosis: Unsteadiness on feet (R26.81);Muscle weakness (generalized) (M62.81);Pain Pain - Right/Left: Left Pain - part of body: Knee    Time: 6144-3154 PT Time Calculation (min) (ACUTE ONLY): 41 min   Charges:             Vale Haven, SPT  Vale Haven 01/06/2020, 3:10 PM

## 2020-01-06 NOTE — Discharge Summary (Signed)
Physician Discharge Summary  Teresa Moyer QIH:[id number] DOB: 28-Jul-1935 DOA: 01/04/2020  PCP: Jerrol Banana., MD  Admit date: 01/04/2020 Discharge date: 01/06/2020  Admitted From: Home Discharge disposition: Home with home health PT   Code Status: Full Code  Diet Recommendation: Cardiac diet   Recommendations for Outpatient Follow-Up:   1. Follow-up with PCP as an outpatient 2. Follow-up with orthopedics as an outpatient 3. Follow-up with cardiology as an outpatient  Discharge Diagnosis:   Principal Problem:   Acute on chronic combined systolic and diastolic CHF (congestive heart failure) (HCC) Active Problems:   Hypertension   Gastro-esophageal reflux disease without esophagitis   Coronary artery disease involving native coronary artery of native heart   Acute blood loss anemia   Hyponatremia with excess extracellular fluid volume   History of Present Illness / Brief narrative:  Teresa Moyer is a 84 y.o. female with PMH HTN, CAD s/p stent July 2020, CHF with EF 45 to 50%, recent left total knee arthroplasty on 7/7. Patient presented to the ED on 7/22 for evaluation of nausea and shortness of breath.   Patient has a history of chronic systolic heart failure and states that she is supposed to weigh herself daily but she has been unable to do that since her surgery 2 weeks ago.  She has been participating with physical therapy and has been ambulating with a rolling walker.   On the day of admission, she developed sudden onset shortness of breath.  She had been taking Lovenox subcu daily as advised for 2 weeks, last dose on 7/21.  Labs showed a serum sodium of 118, BNP of 2472, white count of 14 and hemoglobin of 8.1, which is new compared to hemoglobin of 13.9g/dl at the time of discharge 2 weeks prior. Chest x-ray showed CHF changes with moderate-sized bilateral pleural effusions.  She was admitted to hospital service.  GI and cardiology consultation were  obtained.  Hospital Course:  Acute on chronic combined systolic and diastolic congestive heart failure -Presented with shortness of breath, anemia.   -Chest x-ray on admission showed mild pulmonary edema and moderate bilateral pleural effusion.   -She has started IV Lasix, net diuresis more than 2 L.   -Cardiac consultation obtained. -Home meds include Coreg, irbesartan, Lasix. -Currently IV Lasix.  Discussed with cardiologist Dr. Ubaldo Glassing. Switch to home regimen at discharge.  Acute blood loss anemia -Hemoglobin at discharge on 7/9 was 13.9.   -She has been on 2 weeks course of Lovenox subcu prophylaxis after knee surgery.  Last dose 7/21. -Presented on 7/22 with hemoglobin low at 8.1.  -GI consultation was obtained.  7/23, patient underwent EGD.  Mild erosive gastritis noted without any bleeding or stigmata of recent bleeding.   -Patient has been started on Protonix 40 mg daily. -Plavix resumed. -Hemoglobin this morning stable at 11.1. -Recommend repeating CBC with PCP in a week.  Vitamin B12 deficiency -Noted that her vitamin B12 is low at 131 only. -Partly responsible for anemia. -She has been started on vitamin B12 daily supplements.  Coronary artery disease with history of STEMI 12/27/2018:  -No evidence of acute coronary syndrome.   -Prior to previous surgery patient was on aspirin and Plavix.  While on Lovenox, patient was taking Plavix and had dropped aspirin.   -Plavix has been resumed post normal EGD. -Continue carvedilol, irbesartan, atorvastatin.  GERD -Continue PPI  Recent left total knee arthroplasty on 7/7 -Patient was receiving PT at home. She was also on 2  weeks of subcu Lovenox, last dose 7/21. -She currently has swollen left knee with diffuse hematoma and ecchymosis of the entire left leg extending above and below the knee.  -PT evaluation obtained.  Home with PT recommended. -Patient has follow-up with orthopedics on Monday 7/26.  Wound care:     Subjective:  Seen and examined this morning.  Pleasant elderly Caucasian female. Not in distress. Later in the afternoon, she worked with physical therapy when is able to walk in the hallway.  Discharge Exam:   Vitals:   01/05/20 2031 01/06/20 0409 01/06/20 0847 01/06/20 1211  BP: (!) 121/49 (!) 142/74 (!) 164/75 (!) 124/46  Pulse: 64 63 65 63  Resp:  17 18 17   Temp:  98.3 F (36.8 C) 98.6 F (37 C) 98.4 F (36.9 C)  TempSrc:   Oral Oral  SpO2:  94% 97% 97%  Weight:  68.1 kg    Height:        Body mass index is 27.03 kg/m.  General exam: Appears calm and comfortable.  Not in physical distress Skin: No rashes, lesions or ulcers. HEENT: Atraumatic, normocephalic, supple neck, no obvious bleeding Lungs: Clear to auscultation bilaterally CVS: Regular rate and rhythm, no murmur GI/Abd soft, nontender, nondistended, bowel sound present CNS: Alert, awake, oriented x3 Psychiatry: Mood appropriate Extremities: Left knee with recent surgery, mild swelling, ecchymosis extending proximally and distally, right lower extremity intact.  Discharge Instructions:   Discharge Instructions    Increase activity slowly   Complete by: As directed    Leave dressing on - Keep it clean, dry, and intact until clinic visit   Complete by: As directed       [facility] Follow up on 02/02/2020.   Specialty: Cardiology Why: at 1:00pm. Enter through the [facility] Contact information: [address] [place]       Jerrol Banana., MD Follow up.   Specialty: Family Medicine Contact information: [address] [place] [address] [phone]              Allergies as of 01/06/2020      Reactions   Accupril [quinapril Hcl] Hives, Swelling   SWELLING OF NOSE/FACE   Sulfa Antibiotics Shortness Of Breath, Swelling   Clinoril  [sulindac] Other (See Comments)   Unsure of reaction type   Hydrochlorothiazide Other (See Comments)   Unsure of reaction type   Spironolactone Diarrhea   Tessalon [benzonatate] Other (See Comments)   Unsure of reaction type   Amoxicillin Rash   Has patient had a PCN reaction causing immediate rash, facial/tongue/throat swelling, SOB or lightheadedness with hypotension: Unknown Has patient had a PCN reaction causing severe rash involving mucus membranes or skin necrosis: Unknown Has patient had a PCN reaction that required hospitalization: No Has patient had a PCN reaction occurring within the last 10 years:Possibly unsure  If all of the above answers are "NO", then may proceed with Cephalosporin use.   Codeine Sulfate Nausea And Vomiting   Penicillins Rash   Has patient had a PCN reaction causing immediate rash, facial/tongue/throat swelling, SOB or lightheadedness with hypotension: Unknown Has patient had a PCN reaction causing severe rash involving mucus membranes or skin necrosis: Unknown Has patient had a PCN reaction that required hospitalization: No Has patient had a PCN reaction occurring within the last 10 years:Possibly unsure  If all of the above answers are "  NO", then may proceed with Cephalosporin use.      Medication List    STOP taking these medications   enoxaparin 40 MG/0.4ML injection Commonly known as: LOVENOX     TAKE these medications   acetaminophen 650 MG CR tablet Commonly known as: TYLENOL Take 1,300 mg by mouth every 8 (eight) hours.   Align 4 MG Caps Take 4 mg by mouth daily after lunch.   atorvastatin 40 MG tablet Commonly known as: LIPITOR Take 1 tablet (40 mg total) by mouth daily at 6 PM.   carvedilol 12.5 MG tablet Commonly known as: COREG Take 12.5 mg by mouth 2 (two) times daily with a meal.   celecoxib 200 MG capsule Commonly known as: CELEBREX Take 1 capsule (200 mg total) by mouth 2 (two) times daily.   clopidogrel 75 MG  tablet Commonly known as: PLAVIX Take 1 tablet by mouth at bedtime.   cyanocobalamin 1000 MCG tablet Take 1 tablet (1,000 mcg total) by mouth daily. Start taking on: January 07, 2020   fluticasone 50 MCG/ACT nasal spray Commonly known as: FLONASE Place 1 spray into both nostrils daily.   furosemide 20 MG tablet Commonly known as: LASIX TAKE 1 TABLET BY MOUTH ONCE DAILY What changed: when to take this   GenTeal 0.25-0.3 % Gel Generic drug: Carboxymethylcell-Hypromellose Apply 1 application to eye at bedtime.   irbesartan 300 MG tablet Commonly known as: AVAPRO Take 0.5 tablets (150 mg total) by mouth daily. What changed: when to take this   JOINT FORMULA PO Take 1 capsule by mouth daily before breakfast. Arthrozene   omeprazole 20 MG capsule Commonly known as: PRILOSEC TAKE 1 CAPSULE BY MOUTH ONCE DAILY What changed: when to take this   ondansetron 4 MG disintegrating tablet Commonly known as: ZOFRAN-ODT Take 4 mg by mouth every 8 (eight) hours as needed for nausea or vomiting.   oxyCODONE 5 MG immediate release tablet Commonly known as: Oxy IR/ROXICODONE Take 1 tablet (5 mg total) by mouth every 4 (four) hours as needed for moderate pain (pain score 4-6).   potassium chloride 10 MEQ tablet Commonly known as: Klor-Con 10 Take 1 tablet (10 mEq total) by mouth daily.   PRESERVISION AREDS PO Take 1 tablet by mouth 2 (two) times daily.   Systane 0.4-0.3 % Soln Generic drug: Polyethyl Glycol-Propyl Glycol Place 1 drop into both eyes in the morning and at bedtime.   traMADol 50 MG tablet Commonly known as: ULTRAM Take 1 tablet (50 mg total) by mouth every 4 (four) hours as needed for moderate pain. What changed:   how much to take  how to take this  additional instructions   ZyrTEC Allergy 10 MG tablet Generic drug: cetirizine Take 10 mg by mouth at bedtime.            Discharge Care Instructions  (From admission, onward)         Start     Ordered    01/06/20 0000  Leave dressing on - Keep it clean, dry, and intact until clinic visit        01/06/20 1546          Time coordinating discharge: 35 minutes  The results of significant diagnostics from this hospitalization (including imaging, microbiology, ancillary and laboratory) are listed below for reference.    Procedures and Diagnostic Studies:   CT ABDOMEN PELVIS W CONTRAST  Result Date: 01/04/2020 CLINICAL DATA:  Nausea and vomiting. Two weeks status post total left hip arthroplasty. EXAM:  CT ABDOMEN AND PELVIS WITH CONTRAST TECHNIQUE: Multidetector CT imaging of the abdomen and pelvis was performed using the standard protocol following bolus administration of intravenous contrast. CONTRAST:  114mL OMNIPAQUE IOHEXOL 300 MG/ML  SOLN COMPARISON:  CT scan 12/09/2017 FINDINGS: Lower chest: Moderate-sized bilateral pleural effusions are noted with bibasilar atelectasis. The heart is mildly enlarged. No pericardial effusion. Hepatobiliary: Stable benign-appearing right hepatic lobe cyst. No worrisome hepatic lesions. Intra and extrahepatic biliary dilatation is progressive since 2019 and some of this may be due to cholecystectomy. No obvious common bile duct stones. Recommend correlation with liver function studies. If these are abnormal MRCP may be helpful for further evaluation. The portal and hepatic veins are patent. Pancreas: No mass, inflammation or ductal dilatation. Spleen: Normal size. Small scattered low-attenuation lesions are stable and likely benign cysts. Adrenals/Urinary Tract: Adrenal glands and kidneys are unremarkable. No worrisome renal lesions or renal calculi. Mild bilateral hydroureteronephrosis probably due to a moderately distended bladder. Stomach/Bowel: The stomach, duodenum, small bowel and colon are grossly normal without oral contrast. No acute inflammatory changes, mass lesions or obstructive findings. Vascular/Lymphatic: Scattered atherosclerotic calcifications but no  aneurysm or dissection. The branch vessels are patent. The major venous structures are patent. Small scattered mesenteric and retroperitoneal lymph nodes appears stable. Reproductive: Surgically absent. Other: Very small amount of free pelvic fluid. No pelvic mass or adenopathy. No inguinal mass or adenopathy. Diffuse body wall edema is noted. Musculoskeletal: Asymmetric subcutaneous edema or interstitial changes involving the left upper thigh. Suspect mild myositis also. This could be related to the patient's recent left knee surgery. No acute or significant bony findings. Age related osteopenia. IMPRESSION: 1. Moderate-sized bilateral pleural effusions with bibasilar atelectasis. 2. Intra and extrahepatic biliary dilatation is progressive since 2019 and some of this may be due to prior cholecystectomy. Recommend correlation with liver function studies. If these are abnormal MRCP may be helpful for further evaluation. 3. No acute abdominal/pelvic findings, mass lesions or adenopathy. 4. Asymmetric subcutaneous edema or interstitial changes involving the left upper thigh could be related to the patient's recent left knee surgery. Suspect mild myositis also. 5. Mild bilateral hydroureteronephrosis probably due to a moderately distended bladder. Electronically Signed   By: Marijo Sanes M.D.   On: 01/04/2020 05:47   DG Chest Port 1 View  Result Date: 01/04/2020 CLINICAL DATA:  Dyspnea EXAM: PORTABLE CHEST 1 VIEW COMPARISON:  January 30, 2019 FINDINGS: There is cardiomegaly with moderate-sized bilateral pleural effusions and findings of congestive heart failure. There are areas of atelectasis bilaterally. There is no pneumothorax. No acute osseous abnormality. IMPRESSION: Congestive heart failure with moderate-sized bilateral pleural effusions. Electronically Signed   By: Constance Holster M.D.   On: 01/04/2020 02:27     Labs:   Basic Metabolic Panel: Recent Labs  Lab 01/04/20 0148 01/04/20 0148  01/05/20 0040 01/06/20 0435  NA 118*  --  125* 131*  K 4.5   < > 3.8 3.5  CL 87*  --  89* 95*  CO2 25  --  25 26  GLUCOSE 120*  --  111* 97  BUN 12  --  17 19  CREATININE 0.61  --  0.90 0.84  CALCIUM 8.2*  --  8.7* 8.4*   < > = values in this interval not displayed.   GFR Estimated Creatinine Clearance: 47.3 mL/min (by C-G formula based on SCr of 0.84 mg/dL). Liver Function Tests: Recent Labs  Lab 01/04/20 0148  AST 18  ALT 11  ALKPHOS 83  BILITOT  1.3*  PROT 6.2*  ALBUMIN 3.4*   No results for input(s): LIPASE, AMYLASE in the last 168 hours. No results for input(s): AMMONIA in the last 168 hours. Coagulation profile No results for input(s): INR, PROTIME in the last 168 hours.  CBC: Recent Labs  Lab 01/04/20 0148 01/04/20 1608 01/05/20 0040 01/06/20 0435  WBC 14.0*  --   --  11.7*  NEUTROABS 7.9*  --   --  6.3  HGB 8.1* 10.8* 11.1* 11.1*  HCT 24.6* 31.3* 33.3* 32.8*  MCV 97.2  --   --  91.4  PLT 368  --   --  288   Cardiac Enzymes: No results for input(s): CKTOTAL, CKMB, CKMBINDEX, TROPONINI in the last 168 hours. BNP: Invalid input(s): POCBNP CBG: No results for input(s): GLUCAP in the last 168 hours. D-Dimer No results for input(s): DDIMER in the last 72 hours. Hgb A1c No results for input(s): HGBA1C in the last 72 hours. Lipid Profile No results for input(s): CHOL, HDL, LDLCALC, TRIG, CHOLHDL, LDLDIRECT in the last 72 hours. Thyroid function studies No results for input(s): TSH, T4TOTAL, T3FREE, THYROIDAB in the last 72 hours.  Invalid input(s): FREET3 Anemia work up Recent Labs    01/04/20 1530 01/04/20 1608  VITAMINB12 131*  --   FOLATE  --  20.8  FERRITIN  --  95  TIBC  --  456*  IRON  --  72   Microbiology Recent Results (from the past 240 hour(s))  SARS Coronavirus 2 by RT PCR (hospital order, performed in [facility] hospital lab) Nasopharyngeal Nasopharyngeal Swab     Status: None   Collection Time: 01/04/20  2:50 AM   Specimen:  Nasopharyngeal Swab  Result Value Ref Range Status   SARS Coronavirus 2 NEGATIVE NEGATIVE Final    Comment: (NOTE) SARS-CoV-2 target nucleic acids are NOT DETECTED.  The SARS-CoV-2 RNA is generally detectable in upper and lower respiratory specimens during the acute phase of infection. The lowest concentration of SARS-CoV-2 viral copies this assay can detect is 250 copies / mL. A negative result does not preclude SARS-CoV-2 infection and should not be used as the sole basis for treatment or other patient management decisions.  A negative result may occur with improper specimen collection / handling, submission of specimen other than nasopharyngeal swab, presence of viral mutation(s) within the areas targeted by this assay, and inadequate number of viral copies (<250 copies / mL). A negative result must be combined with clinical observations, patient history, and epidemiological information.  Fact Sheet for Patients:   StrictlyIdeas.no  Fact Sheet for Healthcare Providers: BankingDealers.co.za  This test is not yet approved or  cleared by the [place] FDA and has been authorized for detection and/or diagnosis of SARS-CoV-2 by FDA under an Emergency Use Authorization (EUA).  This EUA will remain in effect (meaning this test can be used) for the duration of the COVID-19 declaration under Section 564(b)(1) of the Act, 21 U.S.C. section 360bbb-3(b)(1), unless the authorization is terminated or revoked sooner.  Performed at [facility], [address], [place], [place] [address]     Please note: You were cared for by a hospitalist during your hospital stay. Once you are discharged, your primary care physician will handle any further medical issues. Please note that NO REFILLS for any discharge medications will be authorized once you are discharged, as it is imperative that you return to your primary care physician (or  establish a relationship with a primary care physician if you  do not have one) for your post hospital discharge needs so that they can reassess your need for medications and monitor your lab values.  Signed: Terrilee Croak  [facility] 01/06/2020, 3:47 PM

## 2020-01-06 NOTE — Plan of Care (Signed)
  Problem: Health Behavior/Discharge Planning: Goal: Ability to manage health-related needs will improve Outcome: Progressing   Problem: Clinical Measurements: Goal: Diagnostic test results will improve Outcome: Progressing Goal: Respiratory complications will improve Outcome: Progressing   Problem: Pain Managment: Goal: General experience of comfort will improve Outcome: Progressing   Problem: Safety: Goal: Ability to remain free from injury will improve Outcome: Progressing

## 2020-01-06 NOTE — Discharge Instructions (Signed)
Anemia  Anemia is a condition in which you do not have enough red blood cells or hemoglobin. Hemoglobin is a substance in red blood cells that carries oxygen. When you do not have enough red blood cells or hemoglobin (are anemic), your body cannot get enough oxygen and your organs may not work properly. As a result, you may feel very tired or have other problems. What are the causes? Common causes of anemia include:  Excessive bleeding. Anemia can be caused by excessive bleeding inside or outside the body, including bleeding from the intestine or from periods in women.  Poor nutrition.  Long-lasting (chronic) kidney, thyroid, and liver disease.  Bone marrow disorders.  Cancer and treatments for cancer.  HIV (human immunodeficiency virus) and AIDS (acquired immunodeficiency syndrome).  Treatments for HIV and AIDS.  Spleen problems.  Blood disorders.  Infections, medicines, and autoimmune disorders that destroy red blood cells. What are the signs or symptoms? Symptoms of this condition include:  Minor weakness.  Dizziness.  Headache.  Feeling heartbeats that are irregular or faster than normal (palpitations).  Shortness of breath, especially with exercise.  Paleness.  Cold sensitivity.  Indigestion.  Nausea.  Difficulty sleeping.  Difficulty concentrating. Symptoms may occur suddenly or develop slowly. If your anemia is mild, you may not have symptoms. How is this diagnosed? This condition is diagnosed based on:  Blood tests.  Your medical history.  A physical exam.  Bone marrow biopsy. Your health care provider may also check your stool (feces) for blood and may do additional testing to look for the cause of your bleeding. You may also have other tests, including:  Imaging tests, such as a CT scan or MRI.  Endoscopy.  Colonoscopy. How is this treated? Treatment for this condition depends on the cause. If you continue to lose a lot of blood, you may  need to be treated at a hospital. Treatment may include:  Taking supplements of iron, vitamin S31, or folic acid.  Taking a hormone medicine (erythropoietin) that can help to stimulate red blood cell growth.  Having a blood transfusion. This may be needed if you lose a lot of blood.  Making changes to your diet.  Having surgery to remove your spleen. Follow these instructions at home:  Take over-the-counter and prescription medicines only as told by your health care provider.  Take supplements only as told by your health care provider.  Follow any diet instructions that you were given.  Keep all follow-up visits as told by your health care provider. This is important. Contact a health care provider if:  You develop new bleeding anywhere in the body. Get help right away if:  You are very weak.  You are short of breath.  You have pain in your abdomen or chest.  You are dizzy or feel faint.  You have trouble concentrating.  You have bloody or black, tarry stools.  You vomit repeatedly or you vomit up blood. Summary  Anemia is a condition in which you do not have enough red blood cells or enough of a substance in your red blood cells that carries oxygen (hemoglobin).  Symptoms may occur suddenly or develop slowly.  If your anemia is mild, you may not have symptoms.  This condition is diagnosed with blood tests as well as a medical history and physical exam. Other tests may be needed.  Treatment for this condition depends on the cause of the anemia. This information is not intended to replace advice given to you by  your health care provider. Make sure you discuss any questions you have with your health care provider. Document Revised: 05/14/2017 Document Reviewed: 07/03/2016 Elsevier Patient Education  2020 Elsevier Inc.   Gastrointestinal Bleeding Gastrointestinal (GI) bleeding is bleeding somewhere along the path that food travels through the body (digestive  tract). This path is anywhere between the mouth and the opening of the butt (anus). You may have blood in your poop (stool) or have black poop. If you throw up (vomit), there may be blood in it. This condition can be mild, serious, or even life-threatening. If you have a lot of bleeding, you may need to stay in the hospital. What are the causes? This condition may be caused by:  Irritation and swelling of the esophagus (esophagitis). The esophagus is part of the body that moves food from your mouth to your stomach.  Swollen veins in the butt (hemorrhoids).  Areas of painful tearing in the opening of the butt (anal fissures). These are often caused by passing hard poop.  Pouches that form on the colon over time (diverticulosis).  Irritation and swelling (diverticulitis) in areas where pouches have formed on the colon.  Growths (polyps) or cancer. Colon cancer often starts out as growths that are not cancer.  Irritation of the stomach lining (gastritis).  Sores (ulcers) in the stomach. What increases the risk? You are more likely to develop this condition if you:  Have a certain type of infection in your stomach (Helicobacter pylori infection).  Take certain medicines.  Smoke.  Drink alcohol. What are the signs or symptoms? Common symptoms of this condition include:  Throwing up (vomiting) material that has bright red blood in it. It may look like coffee grounds.  Changes in your poop. The poop may: ? Have red blood in it. ? Be black, look like tar, and smell stronger than normal. ? Be red.  Pain or cramping in the belly (abdomen). How is this treated? Treatment for this condition depends on the cause of the bleeding. For example:  Sometimes, the bleeding can be stopped during a procedure that is done to find the problem (endoscopy or colonoscopy).  Medicines can be used to: ? Help control irritation, swelling, or infection. ? Reduce acid in your stomach.  Certain  problems can be treated with: ? Creams. ? Medicines that are put in the butt (suppositories). ? Warm baths.  Surgery is sometimes needed.  If you lose a lot of blood, you may need a blood transfusion. If bleeding is mild, you may be allowed to go home. If there is a lot of bleeding, you will need to stay in the hospital. Follow these instructions at home:   Take over-the-counter and prescription medicines only as told by your doctor.  Eat foods that have a lot of fiber in them. These foods include beans, whole grains, and fresh fruits and vegetables. You can also try eating 1-3 prunes each day.  Drink enough fluid to keep your pee (urine) pale yellow.  Keep all follow-up visits as told by your doctor. This is important. Contact a doctor if:  Your symptoms do not get better. Get help right away if:  Your bleeding does not stop.  You feel dizzy or you pass out (faint).  You feel weak.  You have very bad cramps in your back or belly.  You pass large clumps of blood (clots) in your poop.  Your symptoms are getting worse.  You have chest pain or fast heartbeats. Summary  GI   bleeding is bleeding somewhere along the path that food travels through the body (digestive tract).  This bleeding can be caused by many things. Treatment depends on the cause of the bleeding.  Take medicines only as told by your doctor.  Keep all follow-up visits as told by your doctor. This is important. This information is not intended to replace advice given to you by your health care provider. Make sure you discuss any questions you have with your health care provider. Document Revised: 01/12/2018 Document Reviewed: 01/12/2018 Elsevier Patient Education  [place].   Heart Failure, Diagnosis  Heart failure means that your heart is not able to pump blood in the right way. This makes it hard for your body to work well. Heart failure is usually a long-term (chronic) condition. You must  take good care of yourself and follow your treatment plan from your doctor. What are the causes? This condition may be caused by:  High blood pressure.  Build up of cholesterol and fat in the arteries.  Heart attack. This injures the heart muscle.  Heart valves that do not open and close properly.  Damage of the heart muscle. This is also called cardiomyopathy.  Lung disease.  Abnormal heart rhythms. What increases the risk? The risk of heart failure goes up as a person ages. This condition is also more likely to develop in people who:  Are overweight.  Are female.  Smoke or chew tobacco.  Abuse alcohol or illegal drugs.  Have taken medicines that can damage the heart.  Have diabetes.  Have abnormal heart rhythms.  Have thyroid problems.  Have low blood counts (anemia). What are the signs or symptoms? Symptoms of this condition include:  Shortness of breath.  Coughing.  Swelling of the feet, ankles, legs, or belly.  Losing weight for no reason.  Trouble breathing.  Waking from sleep because of the need to sit up and get more air.  Rapid heartbeat.  Being very tired.  Feeling dizzy, or feeling like you may pass out (faint).  Having no desire to eat.  Feeling like you may vomit (nauseous).  Peeing (urinating) more at night.  Feeling confused. How is this treated?     This condition may be treated with:  Medicines. These can be given to treat blood pressure and to make the heart muscles stronger.  Changes in your daily life. These may include eating a healthy diet, staying at a healthy body weight, quitting tobacco and illegal drug use, or doing exercises.  Surgery. Surgery can be done to open blocked valves, or to put devices in the heart, such as pacemakers.  A donor heart (heart transplant). You will receive a healthy heart from a donor. Follow these instructions at home:  Treat other conditions as told by your doctor. These may include  high blood pressure, diabetes, thyroid disease, or abnormal heart rhythms.  Learn as much as you can about heart failure.  Get support as you need it.  Keep all follow-up visits as told by your doctor. This is important. Summary  Heart failure means that your heart is not able to pump blood in the right way.  This condition is caused by high blood pressure, heart attack, or damage of the heart muscle.  Symptoms of this condition include shortness of breath and swelling of the feet, ankles, legs, or belly. You may also feel very tired or feel like you may vomit.  You may be treated with medicines, surgery, or changes in  your daily life.  Treat other health conditions as told by your doctor. This information is not intended to replace advice given to you by your health care provider. Make sure you discuss any questions you have with your health care provider. Document Revised: 08/19/2018 Document Reviewed: 08/19/2018 Elsevier Patient Education  [place].

## 2020-01-06 NOTE — Progress Notes (Incomplete)
Patient Name: Teresa Moyer Date of Encounter: 01/06/2020  Hospital Problem List     Principal Problem:   Acute on chronic combined systolic and diastolic CHF (congestive heart failure) (HCC) Active Problems:   Hypertension   Gastro-esophageal reflux disease without esophagitis   Coronary artery disease involving native coronary artery of native heart   Acute blood loss anemia   Hyponatremia with excess extracellular fluid volume    Patient Profile     ***  Subjective   ***  Inpatient Medications    . artificial tears  1 application Both Eyes QHS  . atorvastatin  40 mg Oral q1800  . azelastine  1 spray Each Nare QHS  . carvedilol  12.5 mg Oral BID WC  . fluticasone  1 spray Each Nare Daily  . furosemide  40 mg Intravenous Q12H  . irbesartan  150 mg Oral BH-q7a  . loratadine  10 mg Oral Daily  . multivitamin-lutein   Oral BID  . pantoprazole (PROTONIX) IV  40 mg Intravenous Q12H  . potassium chloride  10 mEq Oral Daily  . sodium chloride flush  3 mL Intravenous Q12H  . traMADol  25 mg Oral BID  . vitamin B-12  1,000 mcg Oral Daily    Vital Signs    Vitals:   01/05/20 1934 01/05/20 2031 01/06/20 0409 01/06/20 0847  BP: (!) 108/48 (!) 121/49 (!) 142/74 (!) 164/75  Pulse: 63 64 63 65  Resp: 16  17 18   Temp: 98.2 F (36.8 C)  98.3 F (36.8 C) 98.6 F (37 C)  TempSrc:    Oral  SpO2: 94%  94% 97%  Weight:   68.1 kg   Height:        Intake/Output Summary (Last 24 hours) at 01/06/2020 1057 Last data filed at 01/06/2020 1038 Gross per 24 hour  Intake 50 ml  Output 3950 ml  Net -3900 ml   Filed Weights   01/04/20 0157 01/05/20 0253 01/06/20 0409  Weight: 62.6 kg 68.8 kg 68.1 kg    Physical Exam    GEN: Well nourished, well developed, in no acute distress.  HEENT: normal.  Neck: Supple, no JVD, carotid bruits, or masses. Cardiac: RRR, no murmurs, rubs, or gallops. No clubbing, cyanosis, edema.  Radials/DP/PT 2+ and equal bilaterally.  Respiratory:   Respirations regular and unlabored, clear to auscultation bilaterally. GI: Soft, nontender, nondistended, BS + x 4. MS: no deformity or atrophy. Skin: warm and dry, no rash. Neuro:  Strength and sensation are intact. Psych: Normal affect.  Labs    CBC Recent Labs    01/04/20 0148 01/04/20 1608 01/05/20 0040 01/06/20 0435  WBC 14.0*  --   --  11.7*  NEUTROABS 7.9*  --   --  6.3  HGB 8.1*   < > 11.1* 11.1*  HCT 24.6*   < > 33.3* 32.8*  MCV 97.2  --   --  91.4  PLT 368  --   --  288   < > = values in this interval not displayed.   Basic Metabolic Panel Recent Labs    01/05/20 0040 01/06/20 0435  NA 125* 131*  K 3.8 3.5  CL 89* 95*  CO2 25 26  GLUCOSE 111* 97  BUN 17 19  CREATININE 0.90 0.84  CALCIUM 8.7* 8.4*   Liver Function Tests Recent Labs    01/04/20 0148  AST 18  ALT 11  ALKPHOS 83  BILITOT 1.3*  PROT 6.2*  ALBUMIN 3.4*  No results for input(s): LIPASE, AMYLASE in the last 72 hours. Cardiac Enzymes No results for input(s): CKTOTAL, CKMB, CKMBINDEX, TROPONINI in the last 72 hours. BNP Recent Labs    01/04/20 0148  BNP 2,472.3*   D-Dimer No results for input(s): DDIMER in the last 72 hours. Hemoglobin A1C No results for input(s): HGBA1C in the last 72 hours. Fasting Lipid Panel No results for input(s): CHOL, HDL, LDLCALC, TRIG, CHOLHDL, LDLDIRECT in the last 72 hours. Thyroid Function Tests No results for input(s): TSH, T4TOTAL, T3FREE, THYROIDAB in the last 72 hours.  Invalid input(s): FREET3  Telemetry    ***  ECG    ***  Radiology    CT ABDOMEN PELVIS W CONTRAST  Result Date: 01/04/2020 CLINICAL DATA:  Nausea and vomiting. Two weeks status post total left hip arthroplasty. EXAM: CT ABDOMEN AND PELVIS WITH CONTRAST TECHNIQUE: Multidetector CT imaging of the abdomen and pelvis was performed using the standard protocol following bolus administration of intravenous contrast. CONTRAST:  125mL OMNIPAQUE IOHEXOL 300 MG/ML  SOLN  COMPARISON:  CT scan 12/09/2017 FINDINGS: Lower chest: Moderate-sized bilateral pleural effusions are noted with bibasilar atelectasis. The heart is mildly enlarged. No pericardial effusion. Hepatobiliary: Stable benign-appearing right hepatic lobe cyst. No worrisome hepatic lesions. Intra and extrahepatic biliary dilatation is progressive since 2019 and some of this may be due to cholecystectomy. No obvious common bile duct stones. Recommend correlation with liver function studies. If these are abnormal MRCP may be helpful for further evaluation. The portal and hepatic veins are patent. Pancreas: No mass, inflammation or ductal dilatation. Spleen: Normal size. Small scattered low-attenuation lesions are stable and likely benign cysts. Adrenals/Urinary Tract: Adrenal glands and kidneys are unremarkable. No worrisome renal lesions or renal calculi. Mild bilateral hydroureteronephrosis probably due to a moderately distended bladder. Stomach/Bowel: The stomach, duodenum, small bowel and colon are grossly normal without oral contrast. No acute inflammatory changes, mass lesions or obstructive findings. Vascular/Lymphatic: Scattered atherosclerotic calcifications but no aneurysm or dissection. The branch vessels are patent. The major venous structures are patent. Small scattered mesenteric and retroperitoneal lymph nodes appears stable. Reproductive: Surgically absent. Other: Very small amount of free pelvic fluid. No pelvic mass or adenopathy. No inguinal mass or adenopathy. Diffuse body wall edema is noted. Musculoskeletal: Asymmetric subcutaneous edema or interstitial changes involving the left upper thigh. Suspect mild myositis also. This could be related to the patient's recent left knee surgery. No acute or significant bony findings. Age related osteopenia. IMPRESSION: 1. Moderate-sized bilateral pleural effusions with bibasilar atelectasis. 2. Intra and extrahepatic biliary dilatation is progressive since 2019 and  some of this may be due to prior cholecystectomy. Recommend correlation with liver function studies. If these are abnormal MRCP may be helpful for further evaluation. 3. No acute abdominal/pelvic findings, mass lesions or adenopathy. 4. Asymmetric subcutaneous edema or interstitial changes involving the left upper thigh could be related to the patient's recent left knee surgery. Suspect mild myositis also. 5. Mild bilateral hydroureteronephrosis probably due to a moderately distended bladder. Electronically Signed   By: Marijo Sanes M.D.   On: 01/04/2020 05:47   DG Chest Port 1 View  Result Date: 01/04/2020 CLINICAL DATA:  Dyspnea EXAM: PORTABLE CHEST 1 VIEW COMPARISON:  January 30, 2019 FINDINGS: There is cardiomegaly with moderate-sized bilateral pleural effusions and findings of congestive heart failure. There are areas of atelectasis bilaterally. There is no pneumothorax. No acute osseous abnormality. IMPRESSION: Congestive heart failure with moderate-sized bilateral pleural effusions. Electronically Signed   By: Constance Holster  M.D.   On: 01/04/2020 02:27   DG Knee Left Port  Result Date: 12/20/2019 CLINICAL DATA:  Total knee replacement. EXAM: PORTABLE LEFT KNEE - 1-2 VIEW COMPARISON:  None. FINDINGS: Left knee arthroplasty in expected alignment. There is been patellar resurfacing. No periprosthetic lucency or fracture. Recent postsurgical change includes air and edema in the joint space and soft tissues with suprapatellar drain in place. Anterior skin staples. There are ghost tracks in the femur and tibia. IMPRESSION: Left knee arthroplasty without immediate postoperative complication. Electronically Signed   By: Keith Rake M.D.   On: 12/20/2019 17:29    Assessment & Plan    ***  Signed, Javier Docker. Ilanna Deihl MD 01/06/2020, 10:57 AM  Pager: [phone]

## 2020-01-06 NOTE — Plan of Care (Signed)

## 2020-01-08 ENCOUNTER — Telehealth: Payer: Self-pay

## 2020-01-08 ENCOUNTER — Encounter: Payer: Self-pay | Admitting: Gastroenterology

## 2020-01-08 ENCOUNTER — Telehealth: Payer: Self-pay | Admitting: *Deleted

## 2020-01-08 NOTE — Telephone Encounter (Signed)
No HFU scheduled at this time. 

## 2020-01-08 NOTE — Chronic Care Management (AMB) (Signed)
  Chronic Care Management   Note  01/08/2020 Name: [name] MRN: [id number] DOB: Jun 16, 1935  Teresa Moyer is a 84 y.o. year old female who is a primary care patient of Jerrol Banana., MD and is actively engaged with the care management team. I reached out to [name] by phone today to assist with scheduling an initial visit with the RN Case Manager.  Follow up plan: Unsuccessful telephone outreach attempt made. A HIPPA compliant phone message was left for the patient providing contact information and requesting a return call.  The care management team will reach out to the patient again over the next 7 days.  If patient returns call to provider office, please advise to call [facility] at [phone].  [place], [place] [address] Direct Dial: [phone] [email or web address]: [email or web address]

## 2020-01-08 NOTE — Telephone Encounter (Signed)
Transition Care Management Follow-up Telephone Call  Date of discharge and from where: [facility] on 01/06/20  How have you been since you were released from the hospital? Doing ok, patient is still nausea but thinks that is related to not having a BM in 1 week. Pt did have a BM this morning but had to strain so now patient feels "worn out." Nausea is better since BM.  Appetite is good and pt is sleeping good. Declines pain, fever, SOB or v/d.  Any questions or concerns? No   Items Reviewed:  Did the pt receive and understand the discharge instructions provided? Yes   Medications obtained and verified? Yes   Any new allergies since your discharge? No   Dietary orders reviewed? Yes  Do you have support at home? Yes   Other (ie: DME, Home Health, etc): HH was ordered from [facility] and PT was ordered.  Functional Questionnaire: (I = Independent and D = Dependent)  Bathing/Dressing- Needs assistance from husband with bathing.   Meal Prep- D, husband cooks all meals.  Eating- I  Maintaining continence- I  Transferring/Ambulation- D, uses a walker.  Managing Meds- I   Follow up appointments reviewed:    PCP Hospital f/u appt confirmed? Yes  scheduled to see Dr Rosanna Randy on 01/16/20 @ 3:20 PM.  [facility] f/u appt confirmed? Yes    Are transportation arrangements needed? No   If their condition worsens, is the pt aware to call  their PCP or go to the ED? Yes  Was the patient provided with contact information for the PCP's office or ED? Yes  Was the pt encouraged to call back with questions or concerns? Yes

## 2020-01-08 NOTE — Telephone Encounter (Signed)
Copied from [place] [phone]. Topic: General - Other >> Jan 08, 2020  9:58 AM Rainey Pines A wrote: Cecille Rubin rom Buchanan called to get verbal orders for Nursing 1w4 and 3 PRN . Please advise best contact [phone] and this is a secured line .

## 2020-01-09 LAB — SURGICAL PATHOLOGY

## 2020-01-09 NOTE — Anesthesia Postprocedure Evaluation (Signed)
Anesthesia Post Note  Patient: Teresa Moyer  Procedure(s) Performed: ESOPHAGOGASTRODUODENOSCOPY (EGD) WITH PROPOFOL (N/A )  Patient location during evaluation: Endoscopy Anesthesia Type: General Level of consciousness: awake and alert Pain management: pain level controlled Vital Signs Assessment: post-procedure vital signs reviewed and stable Respiratory status: spontaneous breathing, nonlabored ventilation, respiratory function stable and patient connected to nasal cannula oxygen Cardiovascular status: blood pressure returned to baseline and stable Postop Assessment: no apparent nausea or vomiting Anesthetic complications: no   No complications documented.   Last Vitals:  Vitals:   01/06/20 0847 01/06/20 1211  BP: (!) 164/75 (!) 124/46  Pulse: 65 63  Resp: 18 17  Temp: 37 C 36.9 C  SpO2: 97% 97%    Last Pain:  Vitals:   01/06/20 1211  TempSrc: Oral  PainSc:                  Martha Clan

## 2020-01-11 DIAGNOSIS — H353231 Exudative age-related macular degeneration, bilateral, with active choroidal neovascularization: Secondary | ICD-10-CM | POA: Diagnosis not present

## 2020-01-12 ENCOUNTER — Telehealth: Payer: Self-pay | Admitting: Family

## 2020-01-12 NOTE — Telephone Encounter (Signed)
Spoke to patient who is doing ok since her hospital discharge. She is checking her weight daily, taking medicaitons as prescribed, following a low sodium diet, and staying active as much as she can. She confirmed her follow up [facility] appointment with [place] for 8/20.   Ara Mano, NT

## 2020-01-12 NOTE — Telephone Encounter (Signed)
ok 

## 2020-01-12 NOTE — Telephone Encounter (Signed)
Tried calling back with verbal order. Line was busy.

## 2020-01-14 DIAGNOSIS — Z471 Aftercare following joint replacement surgery: Secondary | ICD-10-CM | POA: Diagnosis not present

## 2020-01-16 ENCOUNTER — Encounter: Payer: Self-pay | Admitting: Family Medicine

## 2020-01-16 ENCOUNTER — Other Ambulatory Visit: Payer: Self-pay

## 2020-01-16 ENCOUNTER — Ambulatory Visit (INDEPENDENT_AMBULATORY_CARE_PROVIDER_SITE_OTHER): Payer: Medicare Other | Admitting: Family Medicine

## 2020-01-16 VITALS — BP 165/89 | HR 84 | Temp 98.3°F | Ht 62.0 in | Wt 146.4 lb

## 2020-01-16 DIAGNOSIS — R197 Diarrhea, unspecified: Secondary | ICD-10-CM

## 2020-01-16 DIAGNOSIS — I2510 Atherosclerotic heart disease of native coronary artery without angina pectoris: Secondary | ICD-10-CM | POA: Diagnosis not present

## 2020-01-16 DIAGNOSIS — E871 Hypo-osmolality and hyponatremia: Secondary | ICD-10-CM

## 2020-01-16 DIAGNOSIS — D62 Acute posthemorrhagic anemia: Secondary | ICD-10-CM

## 2020-01-16 DIAGNOSIS — I509 Heart failure, unspecified: Secondary | ICD-10-CM

## 2020-01-16 NOTE — Patient Instructions (Signed)
INCREASE ALIGN TO 2 DAILY!!!

## 2020-01-16 NOTE — Progress Notes (Signed)
Trena Platt Cummings,acting as a scribe for [name], MD.,have documented all relevant documentation on the behalf of [name], MD,as directed by  [name], MD while in the presence of [name], MD.  Established patient visit   Patient: Teresa Moyer   DOB: Jan 12, 1936   84 y.o. Female  MRN: [id number] Visit Date: 01/16/2020  Today's healthcare provider: [name], MD   Chief Complaint  Patient presents with  . Hospitalization Follow-up   Subjective    HPI   The patient is an 84 year old female who presents for hospital follow up.  She feels much better.  Met up with heart failure, hyponatremia, anemia, CAD. She continues to have some mild diarrhea. Patient says that she has been having some trouble with bowel movements and is supposed to have some blood work done.   Hospital Course:  Acute on chronic combined systolic and diastolic congestive heart failure -Presented with shortness of breath, anemia.   -Chest x-ray on admission showed mild pulmonary edema and moderate bilateral pleural effusion.   -She has started IV Lasix, net diuresis more than 2 L.   -Cardiac consultation obtained. -Home meds include Coreg, irbesartan, Lasix. -Currently IV Lasix.  Discussed with cardiologist Dr. Ubaldo Glassing. Switch to home regimen at discharge.  Acute blood loss anemia -Hemoglobin at discharge on 7/9 was 13.9.   -She has been on 2 weeks course of Lovenox subcu prophylaxis after knee surgery.  Last dose 7/21. -Presented on 7/22 with hemoglobin low at 8.1.  -GI consultation was obtained.  7/23, patient underwent EGD.  Mild erosive gastritis noted without any bleeding or stigmata of recent bleeding.   -Patient has been started on Protonix 40 mg daily. -Plavix resumed. -Hemoglobin this morning stable at 11.1. -Recommend repeating CBC with PCP in a week.  Vitamin B12 deficiency -Noted that her vitamin B12 is low at 131 only. -Partly responsible  for anemia. -She has been started on vitamin B12 daily supplements.  Coronary artery disease with history of STEMI 12/27/2018:  -No evidence of acute coronary syndrome.  -Prior to previous surgery patient was on aspirin and Plavix.  While on Lovenox, patient was taking Plavix and had dropped aspirin.   -Plavix has been resumed post normal EGD. -Continue carvedilol, irbesartan, atorvastatin.  GERD -Continue PPI  Recent left total knee arthroplastyon 7/7 -Patient was receiving PT at home. She was also on 2 weeks of subcu Lovenox, last dose 7/21. -She currently has swollen left knee with diffuse hematoma and ecchymosis of the entire left leg extending above and below the knee.  -PT evaluation obtained.  Home with PT recommended. -Patient has follow-up with orthopedics on Monday 7/26.  Discharge instructions had patient to stop Lovenox.  She was to start B12 on 01/07/20.  No other changes in medications noted.    Medications: Outpatient Medications Prior to Visit  Medication Sig  . acetaminophen (TYLENOL) 650 MG CR tablet Take 1,300 mg by mouth every 8 (eight) hours.   . Carboxymethylcell-Hypromellose (GENTEAL) 0.25-0.3 % GEL Apply 1 application to eye at bedtime.   . carvedilol (COREG) 12.5 MG tablet Take 12.5 mg by mouth 2 (two) times daily with a meal.   . cetirizine (ZYRTEC ALLERGY) 10 MG tablet Take 10 mg by mouth at bedtime.   . clopidogrel (PLAVIX) 75 MG tablet Take 1 tablet by mouth at bedtime.   . fluticasone (FLONASE) 50 MCG/ACT nasal spray Place 1 spray into both nostrils daily.   Marland Kitchen  furosemide (LASIX) 20 MG tablet TAKE 1 TABLET BY MOUTH ONCE DAILY (Patient taking differently: Take 20 mg by mouth every morning. )  . irbesartan (AVAPRO) 300 MG tablet Take 0.5 tablets (150 mg total) by mouth daily. (Patient taking differently: Take 150 mg by mouth every morning. )  . Multiple Vitamins-Minerals (PRESERVISION AREDS PO) Take 1 tablet by mouth 2 (two) times daily.   . Nutritional  Supplements (JOINT FORMULA PO) Take 1 capsule by mouth daily before breakfast. Arthrozene  . omeprazole (PRILOSEC) 20 MG capsule TAKE 1 CAPSULE BY MOUTH ONCE DAILY (Patient taking differently: Take 20 mg by mouth every morning. )  . ondansetron (ZOFRAN-ODT) 4 MG disintegrating tablet Take 4 mg by mouth every 8 (eight) hours as needed for nausea or vomiting.  Vladimir Faster Glycol-Propyl Glycol (SYSTANE) 0.4-0.3 % SOLN Place 1 drop into both eyes in the morning and at bedtime.   . potassium chloride (KLOR-CON 10) 10 MEQ tablet Take 1 tablet (10 mEq total) by mouth daily.  . Probiotic Product (ALIGN) 4 MG CAPS Take 4 mg by mouth daily after lunch.   . traMADol (ULTRAM) 50 MG tablet Take 1 tablet (50 mg total) by mouth every 4 (four) hours as needed for moderate pain. (Patient taking differently: 25 mg every 4 (four) hours as needed for moderate pain. Take 1/2 tablet by mouth every 4 hours PRN, moderate pain)  . vitamin B-12 1000 MCG tablet Take 1 tablet (1,000 mcg total) by mouth daily.  Marland Kitchen atorvastatin (LIPITOR) 40 MG tablet Take 1 tablet (40 mg total) by mouth daily at 6 PM.  . celecoxib (CELEBREX) 200 MG capsule Take 1 capsule (200 mg total) by mouth 2 (two) times daily. (Patient not taking: Reported on 01/08/2020)  . oxyCODONE (OXY IR/ROXICODONE) 5 MG immediate release tablet Take 1 tablet (5 mg total) by mouth every 4 (four) hours as needed for moderate pain (pain score 4-6). (Patient not taking: Reported on 01/04/2020)   No facility-administered medications prior to visit.    Review of Systems     Objective    BP (!) 165/89 (BP Location: Left Arm, Patient Position: Sitting, Cuff Size: Large)   Pulse 84   Temp 98.3 F (36.8 C) (Temporal)   Ht '5\' 2"'  (1.575 m)   Wt 146 lb 6.4 oz (66.4 kg)   BMI 26.78 kg/m  BP Readings from Last 3 Encounters:  01/16/20 (!) 165/89  01/06/20 (!) 124/46  12/22/19 (!) 143/53   Wt Readings from Last 3 Encounters:  01/16/20 146 lb 6.4 oz (66.4 kg)  01/06/20  150 lb 3.2 oz (68.1 kg)  12/20/19 138 lb (62.6 kg)      Physical Exam Vitals reviewed.  Constitutional:      Appearance: Normal appearance.  HENT:     Head: Normocephalic and atraumatic.     Right Ear: External ear normal.     Left Ear: External ear normal.  Eyes:     General: No scleral icterus.    Conjunctiva/sclera: Conjunctivae normal.  Cardiovascular:     Rate and Rhythm: Normal rate and regular rhythm.     Pulses: Normal pulses.     Heart sounds: Normal heart sounds.  Pulmonary:     Breath sounds: Normal breath sounds.  Abdominal:     Palpations: Abdomen is soft.  Skin:    General: Skin is warm and dry.  Neurological:     Mental Status: She is alert and oriented to person, place, and time. Mental status is at baseline.  Psychiatric:        Mood and Affect: Mood normal.        Behavior: Behavior normal.        Thought Content: Thought content normal.        Judgment: Judgment normal.       No results found for any visits on 01/16/20.  Assessment & Plan     1. Hyponatremia with excess extracellular fluid volume Clinically improving. - CBC with Differential/Platelet - Comprehensive metabolic panel - Pro b natriuretic peptide (BNP)[facility])  2. Congestive heart failure, unspecified HF chronicity, unspecified heart failure type ([place]) Followed at CHF clinic - CBC with Differential/Platelet - Comprehensive metabolic panel - Pro b natriuretic peptide (BNP)[facility] [facility])  3. Acute blood loss anemia  - CBC with Differential/Platelet - Comprehensive metabolic panel - Pro b natriuretic peptide (BNP)[facility] [facility])  4. Coronary artery disease involving native coronary artery of native heart without angina pectoris  - CBC with Differential/Platelet - Comprehensive metabolic panel - Pro b natriuretic peptide (BNP)[facility] [facility])  5. Diarrhea, unspecified type Probiotic twice  daily. - CBC with Differential/Platelet - Comprehensive metabolic panel - Pro b natriuretic peptide (BNP)[facility])    Return in about 2 months (around 03/17/2020).         Chalice Philbert Cranford Mon, MD  [facility] [phone] (phone) [phone] (fax)  [place]

## 2020-01-17 ENCOUNTER — Ambulatory Visit
Admission: RE | Admit: 2020-01-17 | Discharge: 2020-01-17 | Disposition: A | Discharge status: Home / Self Care | Payer: Medicare Other | Source: Ambulatory Visit | Attending: Family Medicine | Admitting: Family Medicine

## 2020-01-17 ENCOUNTER — Ambulatory Visit
Admission: RE | Admit: 2020-01-17 | Discharge: 2020-01-17 | Disposition: A | Discharge status: Home / Self Care | Payer: Medicare Other | Attending: Family Medicine | Admitting: Family Medicine

## 2020-01-17 ENCOUNTER — Other Ambulatory Visit: Payer: Self-pay

## 2020-01-17 DIAGNOSIS — J90 Pleural effusion, not elsewhere classified: Secondary | ICD-10-CM | POA: Diagnosis not present

## 2020-01-17 DIAGNOSIS — I509 Heart failure, unspecified: Secondary | ICD-10-CM | POA: Insufficient documentation

## 2020-01-17 DIAGNOSIS — M25662 Stiffness of left knee, not elsewhere classified: Secondary | ICD-10-CM | POA: Diagnosis not present

## 2020-01-17 LAB — CBC WITH DIFFERENTIAL/PLATELET
Basophils Absolute: 0.1 10*3/uL (ref 0.0–0.2)
Basos: 1 %
EOS (ABSOLUTE): 0.1 10*3/uL (ref 0.0–0.4)
Eos: 2 %
Hematocrit: 37.4 % (ref 34.0–46.6)
Hemoglobin: 12.2 g/dL (ref 11.1–15.9)
Immature Grans (Abs): 0 10*3/uL (ref 0.0–0.1)
Immature Granulocytes: 0 %
Lymphocytes Absolute: 2.4 10*3/uL (ref 0.7–3.1)
Lymphs: 32 %
MCH: 29.8 pg (ref 26.6–33.0)
MCHC: 32.6 g/dL (ref 31.5–35.7)
MCV: 91 fL (ref 79–97)
Monocytes Absolute: 0.7 10*3/uL (ref 0.1–0.9)
Monocytes: 9 %
Neutrophils Absolute: 4 10*3/uL (ref 1.4–7.0)
Neutrophils: 56 %
Platelets: 297 10*3/uL (ref 150–450)
RBC: 4.09 x10E6/uL (ref 3.77–5.28)
RDW: 14 % (ref 11.7–15.4)
WBC: 7.3 10*3/uL (ref 3.4–10.8)

## 2020-01-17 LAB — COMPREHENSIVE METABOLIC PANEL
ALT: 11 IU/L (ref 0–32)
AST: 17 IU/L (ref 0–40)
Albumin/Globulin Ratio: 2 (ref 1.2–2.2)
Albumin: 4.1 g/dL (ref 3.6–4.6)
Alkaline Phosphatase: 128 IU/L — ABNORMAL HIGH (ref 48–121)
BUN/Creatinine Ratio: 16 (ref 12–28)
BUN: 13 mg/dL (ref 8–27)
Bilirubin Total: 0.7 mg/dL (ref 0.0–1.2)
CO2: 22 mmol/L (ref 20–29)
Calcium: 9.1 mg/dL (ref 8.7–10.3)
Chloride: 97 mmol/L (ref 96–106)
Creatinine, Ser: 0.83 mg/dL (ref 0.57–1.00)
GFR calc Af Amer: 76 mL/min/{1.73_m2} (ref >59)
GFR calc non Af Amer: 66 mL/min/{1.73_m2} (ref >59)
Globulin, Total: 2.1 g/dL (ref 1.5–4.5)
Glucose: 101 mg/dL — ABNORMAL HIGH (ref 65–99)
Potassium: 4.5 mmol/L (ref 3.5–5.2)
Sodium: 133 mmol/L — ABNORMAL LOW (ref 134–144)
Total Protein: 6.2 g/dL (ref 6.0–8.5)

## 2020-01-17 LAB — PRO B NATRIURETIC PEPTIDE: NT-Pro BNP: 13371 pg/mL — ABNORMAL HIGH (ref 0–738)

## 2020-01-17 NOTE — Telephone Encounter (Signed)
Verbal order was not necessary. Per Cecille Rubin when they went out to evaluate the patient at her home. Patient had changed to a different home health agency.

## 2020-01-19 DIAGNOSIS — M25662 Stiffness of left knee, not elsewhere classified: Secondary | ICD-10-CM | POA: Diagnosis not present

## 2020-01-23 DIAGNOSIS — M25662 Stiffness of left knee, not elsewhere classified: Secondary | ICD-10-CM | POA: Diagnosis not present

## 2020-01-24 ENCOUNTER — Other Ambulatory Visit: Payer: Self-pay | Admitting: Family Medicine

## 2020-01-24 NOTE — Telephone Encounter (Signed)
Requested medication (s) are due for refill today:yes  Requested medication (s) are on the active medication list: {yes  Last refill: 12/22/19/ #30 0 refills  Future visit scheduled  Yes 01/31/20  Notes to clinic: Not delegated, historical provider  Requested Prescriptions  Pending Prescriptions Disp Refills   traMADol (ULTRAM) 50 MG tablet [Pharmacy Med Name: TRAMADOL HCL 50 MG TAB] 100 tablet     Sig: TAKE 1 TABLET BY MOUTH EVERY 6 HOURS AS NEEDED      Not Delegated - Analgesics:  Opioid Agonists Failed - 01/24/2020  1:02 PM      Failed - This refill cannot be delegated      Failed - Urine Drug Screen completed in last 360 days.      Passed - Valid encounter within last 6 months    Recent Outpatient Visits           1 week ago Hyponatremia with excess extracellular fluid volume   [facility] Jerrol Banana., MD   3 months ago Essential (primary) hypertension   [facility] Jerrol Banana., MD   6 months ago Sprain of left knee, unspecified ligament, initial encounter   [facility] Jerrol Banana., MD   7 months ago Sprain of left knee, unspecified ligament, initial encounter   [facility] Jerrol Banana., MD   10 months ago Need for influenza vaccination   [facility] Jerrol Banana., MD       Future Appointments             In 1 week Jerrol Banana., MD [facility], [place]

## 2020-01-26 DIAGNOSIS — M25662 Stiffness of left knee, not elsewhere classified: Secondary | ICD-10-CM | POA: Diagnosis not present

## 2020-01-26 NOTE — Chronic Care Management (AMB) (Signed)
  Chronic Care Management   Note  01/26/2020 Name: Teresa Moyer MRN: [id number] DOB: 12/24/1935  Teresa Moyer is a 84 y.o. year old female who is a primary care patient of Jerrol Banana., MD. I reached out to [name] by phone today in response to a referral sent by [name]'s PCP, Jerrol Banana., MD.     [name] was given information about Chronic Care Management services today including:  1. CCM service includes personalized support from designated clinical staff supervised by her physician, including individualized plan of care and coordination with other care providers 2. 24/7 contact phone numbers for assistance for urgent and routine care needs. 3. Service will only be billed when office clinical staff spend 20 minutes or more in a month to coordinate care. 4. Only one practitioner may furnish and bill the service in a calendar month. 5. The patient may stop CCM services at any time (effective at the end of the month) by phone call to the office staff. 6. The patient will be responsible for cost sharing (co-pay) of up to 20% of the service fee (after annual deductible is met).  Patient agreed to services and verbal consent obtained.   Follow up plan: Telephone appointment with care management team member scheduled for: 02/13/2020  [facility]  [place], [place] [address] Direct Dial: [email or web address]: [email or web address]

## 2020-01-30 DIAGNOSIS — M25662 Stiffness of left knee, not elsewhere classified: Secondary | ICD-10-CM | POA: Diagnosis not present

## 2020-01-30 NOTE — Progress Notes (Signed)
Virtual telephone visit Virtual Visit via Telephone Note  I connected with Teresa Moyer on 02/03/20 at  2:20 PM EDT by telephone and verified that I am speaking with the correct person using two identifiers.  Location: Patient: Writer: Car   I discussed the limitations, risks, security and privacy concerns of performing an evaluation and management service by telephone and the availability of in person appointments. I also discussed with the patient that there may be a patient responsible charge related to this service. The patient expressed understanding and agreed to proceed.   History of Present Illness:    Observations/Objective:   Assessment and Plan:   Follow Up Instructions:    I discussed the assessment and treatment plan with the patient. The patient was provided an opportunity to ask questions and all were answered. The patient agreed with the plan and demonstrated an understanding of the instructions.   The patient was advised to call back or seek an in-person evaluation if the symptoms worsen or if the condition fails to improve as anticipated.  I provided 10 minutes of non-face-to-face time during this encounter.   Son Barkan Cranford Mon, MD    Virtual Visit via Telephone Note   This visit type was conducted due to national recommendations for restrictions regarding the COVID-19 Pandemic (e.g. social distancing) in an effort to limit this patient's exposure and mitigate transmission in our community. Due to her co-morbid illnesses, this patient is at least at moderate risk for complications without adequate follow up. This format is felt to be most appropriate for this patient at this time. The patient did not have access to video technology or had technical difficulties with video requiring transitioning to audio format only (telephone). Physical exam was limited to content and character of the telephone converstion.    Patient location: Car Provider  location: Car   Visit Date: 01/31/2020  Today's healthcare provider: [name], MD   No chief complaint on file.  Subjective    HPI  Patient is feeling better but continues to have some mild dyspnea but recently has developed some congestion. No known Covid exposure. Hyponatremia with excess extracellular fluid volume From 01/16/2020-Clinically improving. Chest x-ray obtained showing-improvement.  Acute blood loss anemia From 01/16/2020-labs checked showing-stable.   Diarrhea, unspecified type From 01/16/2020-Probiotic twice daily.       Medications: Outpatient Medications Prior to Visit  Medication Sig  . traMADol (ULTRAM) 50 MG tablet Take 0.5 tablets (25 mg total) by mouth every 6 (six) hours as needed for moderate pain. Take 1/2 tablet by mouth every 4 hours PRN, moderate pain  . acetaminophen (TYLENOL) 650 MG CR tablet Take 1,300 mg by mouth every 8 (eight) hours.   Marland Kitchen atorvastatin (LIPITOR) 40 MG tablet Take 1 tablet (40 mg total) by mouth daily at 6 PM.  . Carboxymethylcell-Hypromellose (GENTEAL) 0.25-0.3 % GEL Apply 1 application to eye at bedtime.   . carvedilol (COREG) 12.5 MG tablet Take 12.5 mg by mouth 2 (two) times daily with a meal.   . celecoxib (CELEBREX) 200 MG capsule Take 1 capsule (200 mg total) by mouth 2 (two) times daily. (Patient not taking: Reported on 01/08/2020)  . cetirizine (ZYRTEC ALLERGY) 10 MG tablet Take 10 mg by mouth at bedtime.   . fluticasone (FLONASE) 50 MCG/ACT nasal spray Place 1 spray into both nostrils daily.   . furosemide (LASIX) 20 MG tablet TAKE 1 TABLET BY MOUTH ONCE DAILY (Patient taking differently: Take 20 mg by mouth  every morning. )  . irbesartan (AVAPRO) 300 MG tablet Take 0.5 tablets (150 mg total) by mouth daily. (Patient taking differently: Take 150 mg by mouth every morning. )  . Multiple Vitamins-Minerals (PRESERVISION AREDS PO) Take 1 tablet by mouth 2 (two) times daily.   . Nutritional Supplements (JOINT  FORMULA PO) Take 1 capsule by mouth daily before breakfast. Arthrozene  . omeprazole (PRILOSEC) 20 MG capsule TAKE 1 CAPSULE BY MOUTH ONCE DAILY (Patient taking differently: Take 20 mg by mouth every morning. )  . ondansetron (ZOFRAN-ODT) 4 MG disintegrating tablet Take 4 mg by mouth every 8 (eight) hours as needed for nausea or vomiting.  Marland Kitchen oxyCODONE (OXY IR/ROXICODONE) 5 MG immediate release tablet Take 1 tablet (5 mg total) by mouth every 4 (four) hours as needed for moderate pain (pain score 4-6). (Patient not taking: Reported on 01/04/2020)  . Polyethyl Glycol-Propyl Glycol (SYSTANE) 0.4-0.3 % SOLN Place 1 drop into both eyes in the morning and at bedtime.   . potassium chloride (KLOR-CON 10) 10 MEQ tablet Take 1 tablet (10 mEq total) by mouth daily.  . Probiotic Product (ALIGN) 4 MG CAPS Take 4 mg by mouth daily after lunch.   . vitamin B-12 1000 MCG tablet Take 1 tablet (1,000 mcg total) by mouth daily.   No facility-administered medications prior to visit.    Review of Systems  Constitutional: Negative.   Respiratory: Negative.   Cardiovascular: Negative.   Musculoskeletal: Negative.        Objective    There were no vitals taken for this visit. Wt Readings from Last 3 Encounters:  01/16/20 146 lb 6.4 oz (66.4 kg)  01/06/20 150 lb 3.2 oz (68.1 kg)  12/20/19 138 lb (62.6 kg)        Assessment & Plan     1. Hyponatremia with excess extracellular fluid volume Follow-up lab work.  This should be resolving.  2. Diarrhea, unspecified type Plan resolved.  3. Congestive heart failure, unspecified HF chronicity, unspecified heart failure type ([place]) Likely improving. Normal lung exam. 4. Nausea  - ondansetron (ZOFRAN-ODT) 4 MG disintegrating tablet; Take 1 tablet (4 mg total) by mouth every 8 (eight) hours as needed for nausea or vomiting.  Dispense: 20 tablet; Refill: 2  5. Vitamin B12 deficiency  - cyanocobalamin 1000 MCG tablet; Take 1 tablet (1,000 mcg total) by  mouth daily.  Dispense: 90 tablet; Refill: 3 6.  URI Do not think this is Covid.  Will have her isolate at home until symptoms improve or resolve.  No follow-ups on file.    I discussed the assessment and treatment plan with the patient. The patient was provided an opportunity to ask questions and all were answered. The patient agreed with the plan and demonstrated an understanding of the instructions.   The patient was advised to call back or seek an in-person evaluation if the symptoms worsen or if the condition fails to improve as anticipated.  I provided 10 minutes of non-face-to-face time during this encounter.    Teresa Moyer Cranford Mon, MD [facility] [phone] (phone) [phone] (fax)  [place]

## 2020-01-31 ENCOUNTER — Encounter: Payer: Self-pay | Admitting: Family Medicine

## 2020-01-31 ENCOUNTER — Ambulatory Visit (INDEPENDENT_AMBULATORY_CARE_PROVIDER_SITE_OTHER): Payer: Medicare Other | Admitting: Family Medicine

## 2020-01-31 ENCOUNTER — Other Ambulatory Visit: Payer: Self-pay

## 2020-01-31 DIAGNOSIS — R197 Diarrhea, unspecified: Secondary | ICD-10-CM | POA: Diagnosis not present

## 2020-01-31 DIAGNOSIS — I509 Heart failure, unspecified: Secondary | ICD-10-CM

## 2020-01-31 DIAGNOSIS — R110 Nausea: Secondary | ICD-10-CM | POA: Diagnosis not present

## 2020-01-31 DIAGNOSIS — E538 Deficiency of other specified B group vitamins: Secondary | ICD-10-CM | POA: Diagnosis not present

## 2020-01-31 DIAGNOSIS — I2510 Atherosclerotic heart disease of native coronary artery without angina pectoris: Secondary | ICD-10-CM

## 2020-01-31 DIAGNOSIS — E871 Hypo-osmolality and hyponatremia: Secondary | ICD-10-CM

## 2020-01-31 MED ORDER — CYANOCOBALAMIN 1000 MCG PO TABS: 1000.0000 ug | ORAL_TABLET | Freq: Every day | ORAL | 3 refills | Status: AC

## 2020-01-31 MED ORDER — ONDANSETRON 4 MG PO TBDP: 4.0000 mg | ORAL_TABLET | Freq: Three times a day (TID) | ORAL | 2 refills | Status: DC | PRN

## 2020-02-02 ENCOUNTER — Ambulatory Visit: Payer: Medicare Other | Admitting: Family

## 2020-02-05 ENCOUNTER — Telehealth: Payer: Self-pay | Admitting: Family Medicine

## 2020-02-05 NOTE — Telephone Encounter (Signed)
Pt was to call her dr today with update She states she is feeling better, breathing better.  Will not double up on the lasix again unless he tells her to.  Pt states you can call her if you want, but no real need at this time.

## 2020-02-06 ENCOUNTER — Telehealth: Payer: Self-pay | Admitting: Family Medicine

## 2020-02-06 NOTE — Telephone Encounter (Signed)
Pt reports SOB with exertion. States she had to go to [employer] today and walking  became SOB. Denies swelling, no SOB at rest, no wheezing.  Pt questioning if  she should double up on lasix again as directed earlier.   Please advise: [phone]

## 2020-02-06 NOTE — Telephone Encounter (Signed)
In error

## 2020-02-07 NOTE — Telephone Encounter (Signed)
Please advise 

## 2020-02-07 NOTE — Telephone Encounter (Signed)
Patient advised and scheduled for follow up on Sept. 1st.

## 2020-02-07 NOTE — Telephone Encounter (Signed)
Yes double up on Lasix and see [place] next week.

## 2020-02-08 ENCOUNTER — Ambulatory Visit (INDEPENDENT_AMBULATORY_CARE_PROVIDER_SITE_OTHER): Payer: Medicare Other

## 2020-02-08 DIAGNOSIS — I10 Essential (primary) hypertension: Secondary | ICD-10-CM

## 2020-02-08 DIAGNOSIS — I509 Heart failure, unspecified: Secondary | ICD-10-CM

## 2020-02-08 NOTE — Chronic Care Management (AMB) (Signed)
Chronic Care Management   Initial Visit Note  02/08/2020 Name: Teresa Moyer MRN: [id number] DOB: July 22, 1935  Primary Care Provider: Jerrol Banana., MD Reason for referral : Chronic Care Management   Teresa Moyer is a 84 y.o. year old female who is a primary care patient of Jerrol Banana., MD. The CCM team was consulted for assistance with chronic disease management and care coordination. A telephonic outreach was conducted today.  Review of Teresa Moyer's status, including review of consultants reports, relevant labs and test results was conducted today. Collaboration with appropriate care team members was performed as part of the comprehensive evaluation and provision of chronic care management services.    SDOH (Social Determinants of Health) assessments performed: Yes See Care Plan activities for detailed interventions related to SDOH  SDOH Interventions     Most Recent Value  SDOH Interventions  Food Insecurity Interventions Intervention Not Indicated  Transportation Interventions Intervention Not Indicated       Medications: Outpatient Encounter Medications as of 02/08/2020  Medication Sig Note  . acetaminophen (TYLENOL) 650 MG CR tablet Take 1,300 mg by mouth every 8 (eight) hours.    Marland Kitchen atorvastatin (LIPITOR) 40 MG tablet Take 1 tablet (40 mg total) by mouth daily at 6 PM.   . Carboxymethylcell-Hypromellose (GENTEAL) 0.25-0.3 % GEL Apply 1 application to eye at bedtime.    . carvedilol (COREG) 12.5 MG tablet Take 12.5 mg by mouth 2 (two) times daily with a meal.    . cetirizine (ZYRTEC ALLERGY) 10 MG tablet Take 10 mg by mouth at bedtime.    . cyanocobalamin 1000 MCG tablet Take 1 tablet (1,000 mcg total) by mouth daily.   . fluticasone (FLONASE) 50 MCG/ACT nasal spray Place 1 spray into both nostrils daily.    . furosemide (LASIX) 20 MG tablet TAKE 1 TABLET BY MOUTH ONCE DAILY (Patient taking differently: Take 20 mg by mouth every morning. ) 02/08/2020:  Reports currently taking 34m  . Multiple Vitamins-Minerals (PRESERVISION AREDS PO) Take 1 tablet by mouth 2 (two) times daily.    . Nutritional Supplements (JOINT FORMULA PO) Take 1 capsule by mouth daily before breakfast. Arthrozene   . omeprazole (PRILOSEC) 20 MG capsule TAKE 1 CAPSULE BY MOUTH ONCE DAILY (Patient taking differently: Take 20 mg by mouth every morning. )   . ondansetron (ZOFRAN-ODT) 4 MG disintegrating tablet Take 1 tablet (4 mg total) by mouth every 8 (eight) hours as needed for nausea or vomiting.   .Vladimir FasterGlycol-Propyl Glycol (SYSTANE) 0.4-0.3 % SOLN Place 1 drop into both eyes in the morning and at bedtime.    . potassium chloride (KLOR-CON 10) 10 MEQ tablet Take 1 tablet (10 mEq total) by mouth daily.   . Probiotic Product (ALIGN) 4 MG CAPS Take 4 mg by mouth daily after lunch.  02/08/2020: Reports currently taking 2 a day  . traMADol (ULTRAM) 50 MG tablet Take 0.5 tablets (25 mg total) by mouth every 6 (six) hours as needed for moderate pain. Take 1/2 tablet by mouth every 4 hours PRN, moderate pain   . celecoxib (CELEBREX) 200 MG capsule Take 1 capsule (200 mg total) by mouth 2 (two) times daily. (Patient not taking: Reported on 01/08/2020)   . irbesartan (AVAPRO) 300 MG tablet Take 0.5 tablets (150 mg total) by mouth daily. (Patient taking differently: Take 150 mg by mouth every morning. )   . oxyCODONE (OXY IR/ROXICODONE) 5 MG immediate release tablet Take 1 tablet (5 mg total) by  mouth every 4 (four) hours as needed for moderate pain (pain score 4-6). (Patient not taking: Reported on 01/04/2020) 01/04/2020: Pt. Does not like the way this medication makes her feel (loopy)   No facility-administered encounter medications on file as of 02/08/2020.     Goals Addressed            This Visit's Progress   . Chronic Disease Management       CARE PLAN ENTRY (see longitudinal plan of care for additional care plan information)  Current Barriers:  . Chronic Disease  Management support and education needs related to HTN, CAP, CHF, GERD, and HLD. (Hx of Skin Ca)   Case Manager Clinical Goal(s):  Over the next 90 days, patient will: . Not require hospitalization d/t complications r/t chronic illnesses. Over the next 120 days, patient will: . Continue to take all medications as prescribed. . Continue attending appointments as scheduled. . Monitor blood pressure and record readings. . Monitor weight and record readings. . Adhere to recommended cardiac prudent/heart healthy diet. . Follow recommended safety precautions to prevent falls and injuries.   Interventions:  . Inter-disciplinary care team collaboration (see longitudinal plan of care) . Reviewed medications. Advised to continue taking medications as prescribed and notify provider if unable to tolerate prescribed regimen. Encouraged to notify the care management team with concerns regarding prescription cost. . Provided information regarding established BP parameters and indications for notifying a provider. Encouraged to monitor a few times a week if unable to monitor daily and record readings.  . Reviewed s/sx of complications r/t CHF and indications for seeking medical attention. Advised to weigh daily and report weight gain greater than 3 lbs overnight or greater than 5 lbs in one week. Reports current weight of 148 lbs. Denies episodes of chest discomfort or shortness of breath. Denies abdominal or lower extremity edema.  . Discussed safety and fall prevention measures. Encouraged to avoid over strenuous activities and keep assistive devices available to use when needed. Encouraged to keep pathways clear and well lit. Reports having a walker. Currently attending physical therapy sessions with [facility] twice a week. . Reviewed pending/scheduled appointments. Encouraged to attend medical appointments as scheduled to prevent delays in care. Encouraged to inform care management team with concerns  regarding transportation assistance.  . Discussed plans for ongoing care management and follow ups. Provided direct contact information.     Patient Self Care Activities:  . Self administers medications as prescribed . Attends all scheduled provider appointments . Calls pharmacy for medication refills . Performs ADL's independently . Performs IADL's independently . Calls provider office for new concerns or questions   Initial goal documentation         Ms. Schools was given information about Chronic Care Management services including:  1. CCM service includes personalized support from designated clinical staff supervised by her physician, including individualized plan of care and coordination with other care providers 2. 24/7 contact phone numbers for assistance for urgent and routine care needs. 3. Service will only be billed when office clinical staff spend 20 minutes or more in a month to coordinate care. 4. Only one practitioner may furnish and bill the service in a calendar month. 5. The patient may stop CCM services at any time (effective at the end of the month) by phone call to the office staff. 6. The patient will be responsible for cost sharing (co-pay) of up to 20% of the service fee (after annual deductible is met).  Patient agreed to  services and verbal consent obtained.     PLAN The care management team will follow-up with Mrs. Facey within the next two months.    [facility] [phone]

## 2020-02-09 DIAGNOSIS — Z96652 Presence of left artificial knee joint: Secondary | ICD-10-CM | POA: Diagnosis not present

## 2020-02-09 DIAGNOSIS — M25662 Stiffness of left knee, not elsewhere classified: Secondary | ICD-10-CM | POA: Diagnosis not present

## 2020-02-09 NOTE — Progress Notes (Signed)
Trena Platt Cummings,acting as a scribe for [name], MD.,have documented all relevant documentation on the behalf of [name], MD,as directed by  [name], MD while in the presence of [name], MD.   Established patient visit   Patient: Teresa Moyer   DOB: Jul 20, 1935   84 y.o. Female  MRN: [id number] Visit Date: 02/14/2020  Today's healthcare provider: [name], MD   Chief Complaint  Patient presents with  . Follow-up   Subjective    HPI   Patient presents today in office to follow up on leg swelling and shortness of breath.   Patient is still having shortness of breath and has been taking a double dose of lasix as advised by pcp.  She is not having any chest pain but her dyspnea on exertion is no better and she is having a little bit of orthopnea.  No PND.  She seems to have some swelling in her legs that is a little bit worse. Past Medical History:  Diagnosis Date  . Cataracts, bilateral   . CHF (congestive heart failure) ([place]) 02/2019  . Coronary artery disease   . Environmental allergies   . Family history of adverse reaction to anesthesia    sister-nauseated  . GERD (gastroesophageal reflux disease)   . Heart murmur 2019   had since a child. dr. Nehemiah Massed follows d/t getting louder  . Hemorrhoids   . History of chickenpox   . Hypertension   . Incontinence in female   . Macular degeneration   . Myocardial infarction ([place]) 01/2019   1 stent   . Osteoarthritis   . Skin cancer 12/2017/11-2019   BCC. nose removed from nose last week.  shoulder squamos cell removed previously  . Type O blood, Rh negative 12/2017   patient requests that this be present on her chart       Medications: Outpatient Medications Prior to Visit  Medication Sig  . acetaminophen (TYLENOL) 650 MG CR tablet Take 1,300 mg by mouth every 8 (eight) hours.   Marland Kitchen atorvastatin (LIPITOR) 40 MG tablet Take 1 tablet (40 mg total) by mouth daily at 6  PM.  . Carboxymethylcell-Hypromellose (GENTEAL) 0.25-0.3 % GEL Apply 1 application to eye at bedtime.   . carvedilol (COREG) 12.5 MG tablet Take 12.5 mg by mouth 2 (two) times daily with a meal.   . cetirizine (ZYRTEC ALLERGY) 10 MG tablet Take 10 mg by mouth at bedtime.   . cyanocobalamin 1000 MCG tablet Take 1 tablet (1,000 mcg total) by mouth daily.  . fluticasone (FLONASE) 50 MCG/ACT nasal spray Place 1 spray into both nostrils daily.   . furosemide (LASIX) 20 MG tablet TAKE 1 TABLET BY MOUTH ONCE DAILY (Patient taking differently: Take 20 mg by mouth every morning. )  . irbesartan (AVAPRO) 300 MG tablet Take 0.5 tablets (150 mg total) by mouth daily. (Patient taking differently: Take 150 mg by mouth every morning. )  . Multiple Vitamins-Minerals (PRESERVISION AREDS PO) Take 1 tablet by mouth 2 (two) times daily.   . Nutritional Supplements (JOINT FORMULA PO) Take 1 capsule by mouth daily before breakfast. Arthrozene  . omeprazole (PRILOSEC) 20 MG capsule TAKE 1 CAPSULE BY MOUTH ONCE DAILY (Patient taking differently: Take 20 mg by mouth every morning. )  . ondansetron (ZOFRAN-ODT) 4 MG disintegrating tablet Take 1 tablet (4 mg total) by mouth every 8 (eight) hours as needed for nausea or vomiting.  Vladimir Faster Glycol-Propyl Glycol (SYSTANE) 0.4-0.3 %  SOLN Place 1 drop into both eyes in the morning and at bedtime.   . potassium chloride (KLOR-CON 10) 10 MEQ tablet Take 1 tablet (10 mEq total) by mouth daily.  . Probiotic Product (ALIGN) 4 MG CAPS Take 4 mg by mouth daily after lunch.   . traMADol (ULTRAM) 50 MG tablet Take 0.5 tablets (25 mg total) by mouth every 6 (six) hours as needed for moderate pain. Take 1/2 tablet by mouth every 4 hours PRN, moderate pain  . celecoxib (CELEBREX) 200 MG capsule Take 1 capsule (200 mg total) by mouth 2 (two) times daily. (Patient not taking: Reported on 01/08/2020)  . oxyCODONE (OXY IR/ROXICODONE) 5 MG immediate release tablet Take 1 tablet (5 mg total) by  mouth every 4 (four) hours as needed for moderate pain (pain score 4-6). (Patient not taking: Reported on 01/04/2020)   No facility-administered medications prior to visit.    Review of Systems  Constitutional: Negative for appetite change, chills, fatigue and fever.  Respiratory: Negative for chest tightness and shortness of breath.   Cardiovascular: Negative for chest pain and palpitations.  Gastrointestinal: Negative for abdominal pain, nausea and vomiting.  Neurological: Negative for dizziness and weakness.       Objective    BP (!) 152/96 (BP Location: Left Arm, Patient Position: Sitting, Cuff Size: Normal)   Pulse 83   Temp 98.3 F (36.8 C) (Oral)   Ht 5\' 2"  (1.575 m)   Wt 151 lb 9.6 oz (68.8 kg)   BMI 27.73 kg/m  BP Readings from Last 3 Encounters:  02/22/20 120/75  02/14/20 (!) 152/96  01/16/20 (!) 165/89   Wt Readings from Last 3 Encounters:  02/22/20 138 lb 9.6 oz (62.9 kg)  02/14/20 151 lb 9.6 oz (68.8 kg)  01/16/20 146 lb 6.4 oz (66.4 kg)      Physical Exam Vitals reviewed.  Constitutional:      Appearance: Normal appearance.  HENT:     Head: Normocephalic and atraumatic.     Right Ear: External ear normal.     Left Ear: External ear normal.  Eyes:     General: No scleral icterus.    Conjunctiva/sclera: Conjunctivae normal.  Cardiovascular:     Rate and Rhythm: Normal rate and regular rhythm.     Pulses: Normal pulses.     Heart sounds: Normal heart sounds.  Pulmonary:     Breath sounds: Normal breath sounds.  Abdominal:     Palpations: Abdomen is soft.  Musculoskeletal:     Comments: Trace pedal edema  Skin:    General: Skin is warm and dry.  Neurological:     Mental Status: She is alert and oriented to person, place, and time. Mental status is at baseline.  Psychiatric:        Mood and Affect: Mood normal.        Behavior: Behavior normal.        Thought Content: Thought content normal.        Judgment: Judgment normal.       No  results found for any visits on 02/14/20.  Assessment & Plan     1. Essential (primary) hypertension Fair control - metolazone (ZAROXOLYN) 5 MG tablet; Take 1 tablet (5 mg total) by mouth daily.  Dispense: 30 tablet; Refill: 0 - CBC with Differential/Platelet - Pro b natriuretic peptide (BNP)[facility] [facility]) - Comprehensive metabolic panel - TSH - DG Chest 2 View  2. Dyspnea, unspecified type I think this is from CHF.  We will add metolazone for this. - metolazone (ZAROXOLYN) 5 MG tablet; Take 1 tablet (5 mg total) by mouth daily.  Dispense: 30 tablet; Refill: 0 - CBC with Differential/Platelet - Pro b natriuretic peptide (BNP)[facility] [facility]) - Comprehensive metabolic panel - TSH - DG Chest 2 View - furosemide (LASIX) 20 MG tablet; Take 1 tablet (20 mg total) by mouth 2 (two) times daily.  Dispense: 60 tablet; Refill: 5  3. Acute blood loss anemia Follow-up CBC.  I do not think this is the issue at this time. - metolazone (ZAROXOLYN) 5 MG tablet; Take 1 tablet (5 mg total) by mouth daily.  Dispense: 30 tablet; Refill: 0 - CBC with Differential/Platelet - Pro b natriuretic peptide (BNP)[facility] [facility]) - Comprehensive metabolic panel - TSH - DG Chest 2 View  4. Congestive heart failure, unspecified HF chronicity, unspecified heart failure type ([place]) Add metolazone to follow-up 1 week.  We will try to keep her out of the hospital at this time with the Covid pandemic going on.  Obtain lab work as below.  She agrees with plan. - metolazone (ZAROXOLYN) 5 MG tablet; Take 1 tablet (5 mg total) by mouth daily.  Dispense: 30 tablet; Refill: 0 - CBC with Differential/Platelet - Pro b natriuretic peptide (BNP)[facility] [facility]) - Comprehensive metabolic panel - TSH - DG Chest 2 View   No follow-ups on file.      I, [name], MD, have reviewed all documentation for this visit. The documentation on  02/26/20 for the exam, diagnosis, procedures, and orders are all accurate and complete.    Ritik Stavola Cranford Mon, MD  [facility] [phone] (phone) [phone] (fax)  [place]

## 2020-02-12 DIAGNOSIS — M25662 Stiffness of left knee, not elsewhere classified: Secondary | ICD-10-CM | POA: Diagnosis not present

## 2020-02-14 ENCOUNTER — Ambulatory Visit
Admission: RE | Admit: 2020-02-14 | Discharge: 2020-02-14 | Disposition: A | Discharge status: Home / Self Care | Payer: Medicare Other | Attending: Family Medicine | Admitting: Family Medicine

## 2020-02-14 ENCOUNTER — Other Ambulatory Visit: Payer: Self-pay

## 2020-02-14 ENCOUNTER — Encounter: Payer: Self-pay | Admitting: Family Medicine

## 2020-02-14 ENCOUNTER — Ambulatory Visit (INDEPENDENT_AMBULATORY_CARE_PROVIDER_SITE_OTHER): Payer: Medicare Other | Admitting: Family Medicine

## 2020-02-14 ENCOUNTER — Ambulatory Visit
Admission: RE | Admit: 2020-02-14 | Discharge: 2020-02-14 | Disposition: A | Discharge status: Home / Self Care | Payer: Medicare Other | Source: Ambulatory Visit | Attending: Family Medicine | Admitting: Family Medicine

## 2020-02-14 VITALS — BP 152/96 | HR 83 | Temp 98.3°F | Ht 62.0 in | Wt 151.6 lb

## 2020-02-14 DIAGNOSIS — I509 Heart failure, unspecified: Secondary | ICD-10-CM | POA: Diagnosis not present

## 2020-02-14 DIAGNOSIS — I517 Cardiomegaly: Secondary | ICD-10-CM | POA: Diagnosis not present

## 2020-02-14 DIAGNOSIS — I10 Essential (primary) hypertension: Secondary | ICD-10-CM | POA: Insufficient documentation

## 2020-02-14 DIAGNOSIS — I2510 Atherosclerotic heart disease of native coronary artery without angina pectoris: Secondary | ICD-10-CM

## 2020-02-14 DIAGNOSIS — D62 Acute posthemorrhagic anemia: Secondary | ICD-10-CM | POA: Insufficient documentation

## 2020-02-14 DIAGNOSIS — R0602 Shortness of breath: Secondary | ICD-10-CM | POA: Diagnosis not present

## 2020-02-14 DIAGNOSIS — J90 Pleural effusion, not elsewhere classified: Secondary | ICD-10-CM | POA: Diagnosis not present

## 2020-02-14 DIAGNOSIS — R0600 Dyspnea, unspecified: Secondary | ICD-10-CM | POA: Diagnosis not present

## 2020-02-14 MED ORDER — FUROSEMIDE 20 MG PO TABS: 20.0000 mg | ORAL_TABLET | Freq: Two times a day (BID) | ORAL | 5 refills | Status: DC

## 2020-02-14 MED ORDER — METOLAZONE 5 MG PO TABS: 5.0000 mg | ORAL_TABLET | Freq: Every day | ORAL | 0 refills | Status: DC

## 2020-02-15 DIAGNOSIS — H353231 Exudative age-related macular degeneration, bilateral, with active choroidal neovascularization: Secondary | ICD-10-CM | POA: Diagnosis not present

## 2020-02-15 LAB — COMPREHENSIVE METABOLIC PANEL
ALT: 10 IU/L (ref 0–32)
AST: 15 IU/L (ref 0–40)
Albumin/Globulin Ratio: 1.7 (ref 1.2–2.2)
Albumin: 3.9 g/dL (ref 3.6–4.6)
Alkaline Phosphatase: 114 IU/L (ref 48–121)
BUN/Creatinine Ratio: 15 (ref 12–28)
BUN: 11 mg/dL (ref 8–27)
Bilirubin Total: 1.1 mg/dL (ref 0.0–1.2)
CO2: 21 mmol/L (ref 20–29)
Calcium: 8.9 mg/dL (ref 8.7–10.3)
Chloride: 95 mmol/L — ABNORMAL LOW (ref 96–106)
Creatinine, Ser: 0.75 mg/dL (ref 0.57–1.00)
GFR calc Af Amer: 86 mL/min/{1.73_m2} (ref >59)
GFR calc non Af Amer: 74 mL/min/{1.73_m2} (ref >59)
Globulin, Total: 2.3 g/dL (ref 1.5–4.5)
Glucose: 118 mg/dL — ABNORMAL HIGH (ref 65–99)
Potassium: 3.9 mmol/L (ref 3.5–5.2)
Sodium: 130 mmol/L — ABNORMAL LOW (ref 134–144)
Total Protein: 6.2 g/dL (ref 6.0–8.5)

## 2020-02-15 LAB — CBC WITH DIFFERENTIAL/PLATELET
Basophils Absolute: 0 10*3/uL (ref 0.0–0.2)
Basos: 1 %
EOS (ABSOLUTE): 0.1 10*3/uL (ref 0.0–0.4)
Eos: 1 %
Hematocrit: 38.1 % (ref 34.0–46.6)
Hemoglobin: 12.5 g/dL (ref 11.1–15.9)
Immature Grans (Abs): 0 10*3/uL (ref 0.0–0.1)
Immature Granulocytes: 0 %
Lymphocytes Absolute: 2.4 10*3/uL (ref 0.7–3.1)
Lymphs: 29 %
MCH: 28.3 pg (ref 26.6–33.0)
MCHC: 32.8 g/dL (ref 31.5–35.7)
MCV: 86 fL (ref 79–97)
Monocytes Absolute: 0.7 10*3/uL (ref 0.1–0.9)
Monocytes: 9 %
Neutrophils Absolute: 5 10*3/uL (ref 1.4–7.0)
Neutrophils: 60 %
Platelets: 227 10*3/uL (ref 150–450)
RBC: 4.41 x10E6/uL (ref 3.77–5.28)
RDW: 14 % (ref 11.7–15.4)
WBC: 8.2 10*3/uL (ref 3.4–10.8)

## 2020-02-15 LAB — PRO B NATRIURETIC PEPTIDE: NT-Pro BNP: 23245 pg/mL — ABNORMAL HIGH (ref 0–738)

## 2020-02-15 LAB — TSH: TSH: 5.08 u[IU]/mL — ABNORMAL HIGH (ref 0.450–4.500)

## 2020-02-20 DIAGNOSIS — M25662 Stiffness of left knee, not elsewhere classified: Secondary | ICD-10-CM | POA: Diagnosis not present

## 2020-02-21 NOTE — Progress Notes (Signed)
Trena Platt Cummings,acting as a scribe for [name], MD.,have documented all relevant documentation on the behalf of [name], MD,as directed by  [name], MD while in the presence of [name], MD.   Established patient visit   Patient: Teresa Moyer   DOB: 07-10-1935   84 y.o. Female  MRN: [id number] Visit Date: 02/22/2020  Today's healthcare provider: [name], MD   Chief Complaint  Patient presents with  . Follow-up   Subjective    HPI   Patient presents in office today for a 1 week follow up on Dyspnea. Patient says that she is feeling better, and is no longer having SOB.   She is starting to get her strength back and is able to sleep at night now. Dyspnea From 02/14/2020-      Medications: Outpatient Medications Prior to Visit  Medication Sig  . acetaminophen (TYLENOL) 650 MG CR tablet Take 1,300 mg by mouth every 8 (eight) hours.   Marland Kitchen atorvastatin (LIPITOR) 40 MG tablet Take 1 tablet (40 mg total) by mouth daily at 6 PM.  . Carboxymethylcell-Hypromellose (GENTEAL) 0.25-0.3 % GEL Apply 1 application to eye at bedtime.   . carvedilol (COREG) 12.5 MG tablet Take 12.5 mg by mouth 2 (two) times daily with a meal.   . cetirizine (ZYRTEC ALLERGY) 10 MG tablet Take 10 mg by mouth at bedtime.   . cyanocobalamin 1000 MCG tablet Take 1 tablet (1,000 mcg total) by mouth daily.  . fluticasone (FLONASE) 50 MCG/ACT nasal spray Place 1 spray into both nostrils daily.   . furosemide (LASIX) 20 MG tablet Take 1 tablet (20 mg total) by mouth 2 (two) times daily.  . irbesartan (AVAPRO) 300 MG tablet Take 0.5 tablets (150 mg total) by mouth daily. (Patient taking differently: Take 150 mg by mouth every morning. )  . metolazone (ZAROXOLYN) 5 MG tablet Take 1 tablet (5 mg total) by mouth daily.  . Multiple Vitamins-Minerals (PRESERVISION AREDS PO) Take 1 tablet by mouth 2 (two) times daily.   . Nutritional Supplements (JOINT FORMULA PO) Take  1 capsule by mouth daily before breakfast. Arthrozene  . omeprazole (PRILOSEC) 20 MG capsule TAKE 1 CAPSULE BY MOUTH ONCE DAILY (Patient taking differently: Take 20 mg by mouth every morning. )  . ondansetron (ZOFRAN-ODT) 4 MG disintegrating tablet Take 1 tablet (4 mg total) by mouth every 8 (eight) hours as needed for nausea or vomiting.  Vladimir Faster Glycol-Propyl Glycol (SYSTANE) 0.4-0.3 % SOLN Place 1 drop into both eyes in the morning and at bedtime.   . potassium chloride (KLOR-CON 10) 10 MEQ tablet Take 1 tablet (10 mEq total) by mouth daily.  . Probiotic Product (ALIGN) 4 MG CAPS Take 4 mg by mouth daily after lunch.   . traMADol (ULTRAM) 50 MG tablet Take 0.5 tablets (25 mg total) by mouth every 6 (six) hours as needed for moderate pain. Take 1/2 tablet by mouth every 4 hours PRN, moderate pain  . celecoxib (CELEBREX) 200 MG capsule Take 1 capsule (200 mg total) by mouth 2 (two) times daily. (Patient not taking: Reported on 01/08/2020)  . oxyCODONE (OXY IR/ROXICODONE) 5 MG immediate release tablet Take 1 tablet (5 mg total) by mouth every 4 (four) hours as needed for moderate pain (pain score 4-6). (Patient not taking: Reported on 01/04/2020)   No facility-administered medications prior to visit.    Review of Systems  Constitutional: Negative for appetite change, chills, fatigue and fever.  Respiratory: Negative for chest tightness and shortness of breath.   Cardiovascular: Negative for chest pain and palpitations.  Gastrointestinal: Negative for abdominal pain, nausea and vomiting.  Neurological: Negative for dizziness and weakness.       Objective    BP 120/75 (BP Location: Right Arm, Patient Position: Sitting, Cuff Size: Large)   Pulse 78   Temp 97.9 F (36.6 C) (Oral)   Ht 5\' 2"  (1.575 m)   Wt 138 lb 9.6 oz (62.9 kg)   BMI 25.35 kg/m  BP Readings from Last 3 Encounters:  02/27/20 (!) 115/53  02/22/20 120/75  02/14/20 (!) 152/96   Wt Readings from Last 3 Encounters:    02/27/20 138 lb (62.6 kg)  02/22/20 138 lb 9.6 oz (62.9 kg)  02/14/20 151 lb 9.6 oz (68.8 kg)      Physical Exam Vitals reviewed.  Constitutional:      Appearance: Normal appearance.  HENT:     Head: Normocephalic and atraumatic.     Right Ear: External ear normal.     Left Ear: External ear normal.  Eyes:     General: No scleral icterus.    Conjunctiva/sclera: Conjunctivae normal.  Cardiovascular:     Rate and Rhythm: Normal rate and regular rhythm.     Pulses: Normal pulses.     Heart sounds: Normal heart sounds.  Pulmonary:     Breath sounds: Normal breath sounds.  Abdominal:     Palpations: Abdomen is soft.  Skin:    General: Skin is warm and dry.  Neurological:     Mental Status: She is alert and oriented to person, place, and time. Mental status is at baseline.  Psychiatric:        Mood and Affect: Mood normal.        Behavior: Behavior normal.        Thought Content: Thought content normal.        Judgment: Judgment normal.       No results found for any visits on 02/22/20.  Assessment & Plan     1. Congestive heart failure, unspecified HF chronicity, unspecified heart failure type ([place]) Starting Entresto in a couple days.  We will stop metolazone now.  She has instructions to weigh daily. - Pro b natriuretic peptide (BNP)[facility] [facility]) - Renal Function Panel  2. Dyspnea, unspecified type Improved with improvement in CHF  3. Left leg swelling Leg slightly swollen compared to right.  Negative cords and negative Homans' sign.  Obtain Doppler to make sure no DVT - Pro b natriuretic peptide (BNP)[facility] [facility]) - Renal Function Panel - VAS [place] LOWER EXTREMITY VENOUS (DVT)  4. Coronary artery disease involving native coronary artery of native heart without angina pectoris All risk factors treated - Pro b natriuretic peptide (BNP)[facility]) - Renal Function Panel  5. Mixed  hyperlipidemia    No follow-ups on file.      I, [name], MD, have reviewed all documentation for this visit. The documentation on 03/02/20 for the exam, diagnosis, procedures, and orders are all accurate and complete.    Beadie Matsunaga Cranford Mon, MD  [facility] [phone] (phone) [phone] (fax)  [place]

## 2020-02-21 NOTE — Patient Instructions (Addendum)
Thank you for allowing the Chronic Care Management team to participate in your care.   Goals Addressed            This Visit's Progress   . Chronic Disease Management       CARE PLAN ENTRY (see longitudinal plan of care for additional care plan information)  Current Barriers:  . Chronic Disease Management support and education needs related to HTN, CAP, CHF, GERD, and HLD. (Hx of Skin Ca)   Case Manager Clinical Goal(s):  Over the next 90 days, patient will: . Not require hospitalization d/t complications r/t chronic illnesses. Over the next 120 days, patient will: . Continue to take all medications as prescribed. . Continue attending appointments as scheduled. . Monitor blood pressure and record readings. . Monitor weight and record readings. . Adhere to recommended cardiac prudent/heart healthy diet. . Follow recommended safety precautions to prevent falls and injuries.   Interventions:  . Inter-disciplinary care team collaboration (see longitudinal plan of care) . Reviewed medications. Advised to continue taking medications as prescribed and notify provider if unable to tolerate prescribed regimen. Encouraged to notify the care management team with concerns regarding prescription cost. . Provided information regarding established BP parameters and indications for notifying a provider. Encouraged to monitor a few times a week if unable to monitor daily and record readings.  . Reviewed s/sx of complications r/t CHF and indications for seeking medical attention. Advised to weigh daily and report weight gain greater than 3 lbs overnight or greater than 5 lbs in one week. Reports current weight of 148 lbs. Denies episodes of chest discomfort or shortness of breath. Denies abdominal or lower extremity edema.  . Discussed safety and fall prevention measures. Encouraged to avoid over strenuous activities and keep assistive devices available to use when needed. Encouraged to keep pathways  clear and well lit. Reports having a walker. Currently attending physical therapy sessions with [facility] twice a week. . Reviewed pending/scheduled appointments. Encouraged to attend medical appointments as scheduled to prevent delays in care. Encouraged to inform care management team with concerns regarding transportation assistance.  . Discussed plans for ongoing care management and follow ups. Provided direct contact information.     Patient Self Care Activities:  . Self administers medications as prescribed . Attends all scheduled provider appointments . Calls pharmacy for medication refills . Performs ADL's independently . Performs IADL's independently . Calls provider office for new concerns or questions   Initial goal documentation        Ms. Bisch was given information about Chronic Care Management services including:  1. CCM service includes personalized support from designated clinical staff supervised by her physician, including individualized plan of care and coordination with other care providers 2. 24/7 contact phone numbers for assistance for urgent and routine care needs. 3. Service will only be billed when office clinical staff spend 20 minutes or more in a month to coordinate care. 4. Only one practitioner may furnish and bill the service in a calendar month. 5. The patient may stop CCM services at any time (effective at the end of the month) by phone call to the office staff. 6. The patient will be responsible for cost sharing (co-pay) of up to 20% of the service fee (after annual deductible is met).  Patient agreed to services and verbal consent obtained.      Mrs. Kadel verbalized understanding of the information discussed during the telephonic outreach today. Declined need for a mailed/printed copy of the instructions.  The care management team will follow-up with Mrs. Batley within the next two months.    Cristy [facility]  Management [facility] [phone]

## 2020-02-22 ENCOUNTER — Other Ambulatory Visit: Payer: Self-pay

## 2020-02-22 ENCOUNTER — Encounter: Payer: Self-pay | Admitting: Family Medicine

## 2020-02-22 ENCOUNTER — Ambulatory Visit (INDEPENDENT_AMBULATORY_CARE_PROVIDER_SITE_OTHER): Payer: Medicare Other | Admitting: Family Medicine

## 2020-02-22 VITALS — BP 120/75 | HR 78 | Temp 97.9°F | Ht 62.0 in | Wt 138.6 lb

## 2020-02-22 DIAGNOSIS — R0600 Dyspnea, unspecified: Secondary | ICD-10-CM

## 2020-02-22 DIAGNOSIS — I2510 Atherosclerotic heart disease of native coronary artery without angina pectoris: Secondary | ICD-10-CM

## 2020-02-22 DIAGNOSIS — Z23 Encounter for immunization: Secondary | ICD-10-CM | POA: Diagnosis not present

## 2020-02-22 DIAGNOSIS — I2102 ST elevation (STEMI) myocardial infarction involving left anterior descending coronary artery: Secondary | ICD-10-CM | POA: Diagnosis not present

## 2020-02-22 DIAGNOSIS — I10 Essential (primary) hypertension: Secondary | ICD-10-CM | POA: Diagnosis not present

## 2020-02-22 DIAGNOSIS — E782 Mixed hyperlipidemia: Secondary | ICD-10-CM

## 2020-02-22 DIAGNOSIS — I340 Nonrheumatic mitral (valve) insufficiency: Secondary | ICD-10-CM | POA: Diagnosis not present

## 2020-02-22 DIAGNOSIS — M7989 Other specified soft tissue disorders: Secondary | ICD-10-CM | POA: Diagnosis not present

## 2020-02-22 DIAGNOSIS — I38 Endocarditis, valve unspecified: Secondary | ICD-10-CM | POA: Diagnosis not present

## 2020-02-22 DIAGNOSIS — I350 Nonrheumatic aortic (valve) stenosis: Secondary | ICD-10-CM | POA: Diagnosis not present

## 2020-02-22 DIAGNOSIS — I5022 Chronic systolic (congestive) heart failure: Secondary | ICD-10-CM | POA: Diagnosis not present

## 2020-02-22 DIAGNOSIS — R600 Localized edema: Secondary | ICD-10-CM | POA: Diagnosis not present

## 2020-02-22 DIAGNOSIS — I509 Heart failure, unspecified: Secondary | ICD-10-CM

## 2020-02-22 NOTE — Patient Instructions (Signed)
TAKE LAST DOSE OF METOLAZONE ON 9/11.

## 2020-02-23 LAB — RENAL FUNCTION PANEL
Albumin: 4.2 g/dL (ref 3.6–4.6)
BUN/Creatinine Ratio: 24 (ref 12–28)
BUN: 18 mg/dL (ref 8–27)
CO2: 28 mmol/L (ref 20–29)
Calcium: 9.5 mg/dL (ref 8.7–10.3)
Chloride: 87 mmol/L — ABNORMAL LOW (ref 96–106)
Creatinine, Ser: 0.76 mg/dL (ref 0.57–1.00)
GFR calc Af Amer: 84 mL/min/{1.73_m2} (ref >59)
GFR calc non Af Amer: 73 mL/min/{1.73_m2} (ref >59)
Glucose: 132 mg/dL — ABNORMAL HIGH (ref 65–99)
Phosphorus: 3.4 mg/dL (ref 3.0–4.3)
Potassium: 3.4 mmol/L — ABNORMAL LOW (ref 3.5–5.2)
Sodium: 129 mmol/L — ABNORMAL LOW (ref 134–144)

## 2020-02-23 LAB — PRO B NATRIURETIC PEPTIDE: NT-Pro BNP: 6314 pg/mL — ABNORMAL HIGH (ref 0–738)

## 2020-02-26 ENCOUNTER — Telehealth: Payer: Self-pay

## 2020-02-26 ENCOUNTER — Ambulatory Visit (INDEPENDENT_AMBULATORY_CARE_PROVIDER_SITE_OTHER): Payer: Medicare Other

## 2020-02-26 ENCOUNTER — Other Ambulatory Visit: Payer: Self-pay

## 2020-02-26 DIAGNOSIS — R600 Localized edema: Secondary | ICD-10-CM | POA: Diagnosis not present

## 2020-02-26 NOTE — Telephone Encounter (Signed)
Called to advise patient as below.

## 2020-02-26 NOTE — Telephone Encounter (Signed)
Yes, for now

## 2020-02-26 NOTE — Telephone Encounter (Signed)
-----   Message from Jerrol Banana., MD sent at 02/23/2020 11:29 AM EDT ----- Labs improved.K should improve off of Metolazone.
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
## 2020-02-26 NOTE — Telephone Encounter (Signed)
Patient advised of lab results and would like to know if she should still be taking potassium supplement. Please advise?

## 2020-02-27 ENCOUNTER — Encounter: Payer: Self-pay | Admitting: Family

## 2020-02-27 ENCOUNTER — Ambulatory Visit: Payer: Medicare Other | Attending: Family | Admitting: Family

## 2020-02-27 VITALS — BP 115/53 | HR 80 | Resp 16 | Ht 62.0 in | Wt 138.0 lb

## 2020-02-27 DIAGNOSIS — I2510 Atherosclerotic heart disease of native coronary artery without angina pectoris: Secondary | ICD-10-CM | POA: Diagnosis not present

## 2020-02-27 DIAGNOSIS — Z885 Allergy status to narcotic agent status: Secondary | ICD-10-CM | POA: Insufficient documentation

## 2020-02-27 DIAGNOSIS — Z79899 Other long term (current) drug therapy: Secondary | ICD-10-CM | POA: Insufficient documentation

## 2020-02-27 DIAGNOSIS — Z8249 Family history of ischemic heart disease and other diseases of the circulatory system: Secondary | ICD-10-CM | POA: Insufficient documentation

## 2020-02-27 DIAGNOSIS — I5022 Chronic systolic (congestive) heart failure: Secondary | ICD-10-CM | POA: Diagnosis not present

## 2020-02-27 DIAGNOSIS — I252 Old myocardial infarction: Secondary | ICD-10-CM | POA: Diagnosis not present

## 2020-02-27 DIAGNOSIS — I10 Essential (primary) hypertension: Secondary | ICD-10-CM

## 2020-02-27 DIAGNOSIS — M25569 Pain in unspecified knee: Secondary | ICD-10-CM | POA: Insufficient documentation

## 2020-02-27 DIAGNOSIS — K219 Gastro-esophageal reflux disease without esophagitis: Secondary | ICD-10-CM | POA: Insufficient documentation

## 2020-02-27 DIAGNOSIS — I110 Hypertensive heart disease with heart failure: Secondary | ICD-10-CM | POA: Insufficient documentation

## 2020-02-27 DIAGNOSIS — M1990 Unspecified osteoarthritis, unspecified site: Secondary | ICD-10-CM | POA: Insufficient documentation

## 2020-02-27 DIAGNOSIS — Z882 Allergy status to sulfonamides status: Secondary | ICD-10-CM | POA: Insufficient documentation

## 2020-02-27 DIAGNOSIS — Z880 Allergy status to penicillin: Secondary | ICD-10-CM | POA: Insufficient documentation

## 2020-02-27 DIAGNOSIS — Z85828 Personal history of other malignant neoplasm of skin: Secondary | ICD-10-CM | POA: Insufficient documentation

## 2020-02-27 DIAGNOSIS — Z9049 Acquired absence of other specified parts of digestive tract: Secondary | ICD-10-CM | POA: Insufficient documentation

## 2020-02-27 NOTE — Patient Instructions (Addendum)
Continue weighing daily and call for an overnight weight gain of > 2 pounds or a weekly weight gain of >5 pounds.   Call us in the future if you need us 

## 2020-02-28 ENCOUNTER — Telehealth: Payer: Self-pay

## 2020-02-28 DIAGNOSIS — M25662 Stiffness of left knee, not elsewhere classified: Secondary | ICD-10-CM | POA: Diagnosis not present

## 2020-02-28 NOTE — Telephone Encounter (Signed)
Pt returned call, result note read to pt;verbalizes understanding.

## 2020-02-28 NOTE — Telephone Encounter (Signed)
Called to advise patient of [place] results, LVMTCB. If patient calls back it is okay for PEC or someone else to advise patient of results.

## 2020-02-28 NOTE — Telephone Encounter (Signed)
-----   Message from Jerrol Banana., MD sent at 02/27/2020  9:48 AM EDT ----- No DVT.

## 2020-03-01 DIAGNOSIS — M25662 Stiffness of left knee, not elsewhere classified: Secondary | ICD-10-CM | POA: Diagnosis not present

## 2020-03-05 DIAGNOSIS — M25662 Stiffness of left knee, not elsewhere classified: Secondary | ICD-10-CM | POA: Diagnosis not present

## 2020-03-08 DIAGNOSIS — M25662 Stiffness of left knee, not elsewhere classified: Secondary | ICD-10-CM | POA: Diagnosis not present

## 2020-03-11 DIAGNOSIS — I38 Endocarditis, valve unspecified: Secondary | ICD-10-CM | POA: Diagnosis not present

## 2020-03-11 DIAGNOSIS — I10 Essential (primary) hypertension: Secondary | ICD-10-CM | POA: Diagnosis not present

## 2020-03-11 DIAGNOSIS — I350 Nonrheumatic aortic (valve) stenosis: Secondary | ICD-10-CM | POA: Diagnosis not present

## 2020-03-11 DIAGNOSIS — I2510 Atherosclerotic heart disease of native coronary artery without angina pectoris: Secondary | ICD-10-CM | POA: Diagnosis not present

## 2020-03-11 DIAGNOSIS — I5022 Chronic systolic (congestive) heart failure: Secondary | ICD-10-CM | POA: Diagnosis not present

## 2020-03-11 DIAGNOSIS — E782 Mixed hyperlipidemia: Secondary | ICD-10-CM | POA: Diagnosis not present

## 2020-03-11 DIAGNOSIS — I340 Nonrheumatic mitral (valve) insufficiency: Secondary | ICD-10-CM | POA: Diagnosis not present

## 2020-03-11 DIAGNOSIS — R011 Cardiac murmur, unspecified: Secondary | ICD-10-CM | POA: Diagnosis not present

## 2020-03-12 DIAGNOSIS — M25662 Stiffness of left knee, not elsewhere classified: Secondary | ICD-10-CM | POA: Diagnosis not present

## 2020-03-14 DIAGNOSIS — M25662 Stiffness of left knee, not elsewhere classified: Secondary | ICD-10-CM | POA: Diagnosis not present

## 2020-03-19 ENCOUNTER — Ambulatory Visit (INDEPENDENT_AMBULATORY_CARE_PROVIDER_SITE_OTHER): Payer: Medicare Other | Admitting: Family Medicine

## 2020-03-19 ENCOUNTER — Encounter: Payer: Self-pay | Admitting: Family Medicine

## 2020-03-19 ENCOUNTER — Other Ambulatory Visit: Payer: Self-pay

## 2020-03-19 VITALS — BP 152/96 | HR 95 | Temp 97.6°F | Resp 18 | Wt 143.0 lb

## 2020-03-19 DIAGNOSIS — D62 Acute posthemorrhagic anemia: Secondary | ICD-10-CM

## 2020-03-19 DIAGNOSIS — R0600 Dyspnea, unspecified: Secondary | ICD-10-CM

## 2020-03-19 DIAGNOSIS — I5043 Acute on chronic combined systolic (congestive) and diastolic (congestive) heart failure: Secondary | ICD-10-CM

## 2020-03-19 DIAGNOSIS — I10 Essential (primary) hypertension: Secondary | ICD-10-CM

## 2020-03-19 DIAGNOSIS — I509 Heart failure, unspecified: Secondary | ICD-10-CM

## 2020-03-19 DIAGNOSIS — I2510 Atherosclerotic heart disease of native coronary artery without angina pectoris: Secondary | ICD-10-CM

## 2020-03-19 MED ORDER — METOLAZONE 2.5 MG PO TABS: 2.5000 mg | ORAL_TABLET | Freq: Every day | ORAL | 1 refills | Status: DC

## 2020-03-19 MED ORDER — POTASSIUM CHLORIDE ER 10 MEQ PO TBCR: 10.0000 meq | EXTENDED_RELEASE_TABLET | Freq: Two times a day (BID) | ORAL | 1 refills | Status: DC

## 2020-03-19 NOTE — Progress Notes (Signed)
I,[name],acting as a scribe for [name], MD.,have documented all relevant documentation on the behalf of [name], MD,as directed by  [name], MD while in the presence of [name], MD.   Established patient visit   Patient: Teresa Moyer   DOB: 20-Dec-1935   84 y.o. Female  MRN: [id number] Visit Date: 03/19/2020  Today's healthcare provider: [name], MD   Chief Complaint  Patient presents with  . Follow-up   Subjective    HPI  Patient comes in today for follow-up of chronic CHF.  She has now on full dose Entresto.  Her legs are the same but she has dyspnea on exertion.  She has chronic fatigue.  No chest pain.  Her weight has crept up over the past few days.  Her weight a month ago was 138 which is 5 pounds less than today. Left leg swelling From 02/22/2020-Leg slightly swollen compared to right.  Negative cords and negative Homans' sign.  Obtained Doppler to make sure no DVT.   Swelling in left leg has improved.   Patient states she is having worsening shortness of breath upon exertion.      Medications: Outpatient Medications Prior to Visit  Medication Sig  . acetaminophen (TYLENOL) 650 MG CR tablet Take 1,300 mg by mouth every 8 (eight) hours.   Marland Kitchen atorvastatin (LIPITOR) 40 MG tablet Take 1 tablet (40 mg total) by mouth daily at 6 PM.  . Carboxymethylcell-Hypromellose (GENTEAL) 0.25-0.3 % GEL Apply 1 application to eye at bedtime.   . carvedilol (COREG) 12.5 MG tablet Take 12.5 mg by mouth 2 (two) times daily with a meal.   . cetirizine (ZYRTEC ALLERGY) 10 MG tablet Take 10 mg by mouth at bedtime.   . fluticasone (FLONASE) 50 MCG/ACT nasal spray Place 1 spray into both nostrils daily.   . furosemide (LASIX) 20 MG tablet Take 1 tablet (20 mg total) by mouth 2 (two) times daily.  . Multiple Vitamins-Minerals (PRESERVISION AREDS PO) Take 1 tablet by mouth 2 (two) times daily.   Marland Kitchen omeprazole (PRILOSEC) 20 MG capsule  TAKE 1 CAPSULE BY MOUTH ONCE DAILY (Patient taking differently: Take 20 mg by mouth every morning. )  . ondansetron (ZOFRAN-ODT) 4 MG disintegrating tablet Take 1 tablet (4 mg total) by mouth every 8 (eight) hours as needed for nausea or vomiting.  . potassium chloride (KLOR-CON 10) 10 MEQ tablet Take 1 tablet (10 mEq total) by mouth daily.  . Probiotic Product (ALIGN) 4 MG CAPS Take 4 mg by mouth daily after lunch.   . sacubitril-valsartan (ENTRESTO) 49-51 MG Take by mouth.  . traMADol (ULTRAM) 50 MG tablet Take 0.5 tablets (25 mg total) by mouth every 6 (six) hours as needed for moderate pain. Take 1/2 tablet by mouth every 4 hours PRN, moderate pain  . [DISCONTINUED] sacubitril-valsartan (ENTRESTO) 24-26 MG Take 1 tablet by mouth 2 (two) times daily.   No facility-administered medications prior to visit.    Review of Systems  Constitutional: Negative for appetite change, chills, fatigue and fever.  Respiratory: Negative for chest tightness and shortness of breath.   Cardiovascular: Negative for chest pain and palpitations.  Gastrointestinal: Negative for abdominal pain, nausea and vomiting.  Neurological: Negative for dizziness and weakness.       Objective    BP (!) 152/96 (BP Location: Left Arm, Patient Position: Sitting, Cuff Size: Normal)   Pulse 95   Temp 97.6 F (36.4 C) (Oral)  Resp 18   Wt 143 lb (64.9 kg)   SpO2 95%   BMI 26.16 kg/m    Physical Exam Vitals reviewed.  Constitutional:      Appearance: Normal appearance.  HENT:     Head: Normocephalic and atraumatic.     Right Ear: External ear normal.     Left Ear: External ear normal.  Eyes:     General: No scleral icterus.    Conjunctiva/sclera: Conjunctivae normal.  Cardiovascular:     Rate and Rhythm: Normal rate and regular rhythm.     Pulses: Normal pulses.     Heart sounds: Normal heart sounds.  Pulmonary:     Breath sounds: Normal breath sounds.  Abdominal:     Palpations: Abdomen is soft.    Musculoskeletal:     Comments: 1+ edema  Skin:    General: Skin is warm and dry.  Neurological:     Mental Status: She is alert and oriented to person, place, and time. Mental status is at baseline.  Psychiatric:        Mood and Affect: Mood normal.        Behavior: Behavior normal.        Thought Content: Thought content normal.        Judgment: Judgment normal.       No results found for any visits on 03/19/20.  Assessment & Plan     1. Acute on chronic combined systolic and diastolic CHF (congestive heart failure) (HCC) Had metolazone 2.5 mg every morning and increase potassium from 10 mEq to 20 daily.  Follow-up in a week and a half.  2. Congestive heart failure, unspecified HF chronicity, unspecified heart failure type ([place]) EF less than 40%. - metolazone (ZAROXOLYN) 2.5 MG tablet; Take 1 tablet (2.5 mg total) by mouth daily.  Dispense: 30 tablet; Refill: 1  3. Essential (primary) hypertension Follow-up 1 week.  4. Dyspnea, unspecified type I think this is all due to CHF.  5. Acute blood loss anemia Follow-up blood work on next visit.  Include at least a CBC and renal.   No follow-ups on file.      I, [name], MD, have reviewed all documentation for this visit. The documentation on 03/20/20 for the exam, diagnosis, procedures, and orders are all accurate and complete.    Kiernan Atkerson Cranford Mon, MD  [facility] [phone] (phone) [phone] (fax)  [place]

## 2020-03-21 ENCOUNTER — Ambulatory Visit: Payer: Medicare Other | Admitting: Family Medicine

## 2020-03-27 ENCOUNTER — Ambulatory Visit (INDEPENDENT_AMBULATORY_CARE_PROVIDER_SITE_OTHER): Payer: Medicare Other | Admitting: Family Medicine

## 2020-03-27 ENCOUNTER — Encounter: Payer: Self-pay | Admitting: Family Medicine

## 2020-03-27 ENCOUNTER — Other Ambulatory Visit: Payer: Self-pay

## 2020-03-27 VITALS — BP 141/67 | HR 89 | Temp 98.2°F | Ht 62.0 in | Wt 140.8 lb

## 2020-03-27 DIAGNOSIS — I5043 Acute on chronic combined systolic (congestive) and diastolic (congestive) heart failure: Secondary | ICD-10-CM | POA: Diagnosis not present

## 2020-03-27 DIAGNOSIS — R0600 Dyspnea, unspecified: Secondary | ICD-10-CM | POA: Diagnosis not present

## 2020-03-27 DIAGNOSIS — I2510 Atherosclerotic heart disease of native coronary artery without angina pectoris: Secondary | ICD-10-CM

## 2020-03-27 DIAGNOSIS — D62 Acute posthemorrhagic anemia: Secondary | ICD-10-CM | POA: Diagnosis not present

## 2020-03-27 NOTE — Progress Notes (Signed)
Established patient visit   Patient: Teresa Moyer   DOB: 1936/03/20   84 y.o. Female  MRN: [id number] Visit Date: 03/27/2020  Today's healthcare provider: [name], MD   Chief Complaint  Patient presents with  . Anemia  . Congestive Heart Failure  . Shortness of Breath   Subjective    HPI  Starting to feel better over the past couple of weeks.  She is tolerating Zaroxolyn very well.  Weight is down 3 pounds from her last visit. Acute on chronic combined systolic and diastolic CHF (congestive heart failure) ([place]) From 03/19/2020-added metolazone 2.5 mg every morning and increase potassium from 10 mEq to 20 daily.  Follow-up in a week and a half.  Congestive heart failure, unspecified HF chronicity, unspecified heart failure type ([place]) From 03/19/2020-EF less than 40%.  Dyspnea, unspecified type From 03/19/2020-I think this is all due to CHF.  Acute blood loss anemia From 03/19/2020-Follow-up blood work on next visit.  Include at least a CBC and renal.       Medications: Outpatient Medications Prior to Visit  Medication Sig  . acetaminophen (TYLENOL) 650 MG CR tablet Take 1,300 mg by mouth every 8 (eight) hours.   Marland Kitchen atorvastatin (LIPITOR) 40 MG tablet Take 1 tablet (40 mg total) by mouth daily at 6 PM.  . Carboxymethylcell-Hypromellose (GENTEAL) 0.25-0.3 % GEL Apply 1 application to eye at bedtime.   . carvedilol (COREG) 12.5 MG tablet Take 12.5 mg by mouth 2 (two) times daily with a meal.   . cetirizine (ZYRTEC ALLERGY) 10 MG tablet Take 10 mg by mouth at bedtime.   . fluticasone (FLONASE) 50 MCG/ACT nasal spray Place 1 spray into both nostrils daily.   . furosemide (LASIX) 20 MG tablet Take 1 tablet (20 mg total) by mouth 2 (two) times daily.  . metolazone (ZAROXOLYN) 2.5 MG tablet Take 1 tablet (2.5 mg total) by mouth daily.  . Multiple Vitamins-Minerals (PRESERVISION AREDS PO) Take 1 tablet by mouth 2 (two) times daily.   Marland Kitchen omeprazole (PRILOSEC)  20 MG capsule TAKE 1 CAPSULE BY MOUTH ONCE DAILY (Patient taking differently: Take 20 mg by mouth every morning. )  . ondansetron (ZOFRAN-ODT) 4 MG disintegrating tablet Take 1 tablet (4 mg total) by mouth every 8 (eight) hours as needed for nausea or vomiting.  . potassium chloride (KLOR-CON 10) 10 MEQ tablet Take 1 tablet (10 mEq total) by mouth 2 (two) times daily.  . Probiotic Product (ALIGN) 4 MG CAPS Take 4 mg by mouth daily after lunch.   . sacubitril-valsartan (ENTRESTO) 49-51 MG Take by mouth.  . traMADol (ULTRAM) 50 MG tablet Take 0.5 tablets (25 mg total) by mouth every 6 (six) hours as needed for moderate pain. Take 1/2 tablet by mouth every 4 hours PRN, moderate pain   No facility-administered medications prior to visit.    Review of Systems  Constitutional: Negative for appetite change, chills, fatigue and fever.  Respiratory: Negative for chest tightness and shortness of breath.   Cardiovascular: Negative for chest pain and palpitations.  Gastrointestinal: Negative for abdominal pain, nausea and vomiting.  Neurological: Negative for dizziness and weakness.       Objective    BP (!) 141/67 (BP Location: Right Arm, Patient Position: Sitting, Cuff Size: Normal)   Pulse 89   Temp 98.2 F (36.8 C) (Oral)   Ht 5\' 2"  (1.575 m)   Wt 140 lb 12.8 oz (63.9 kg)   SpO2 97%   BMI 25.75  kg/m   Physical Exam Vitals reviewed.  Constitutional:      Appearance: Normal appearance.  HENT:     Head: Normocephalic and atraumatic.     Right Ear: External ear normal.     Left Ear: External ear normal.  Eyes:     General: No scleral icterus.    Conjunctiva/sclera: Conjunctivae normal.  Cardiovascular:     Rate and Rhythm: Normal rate and regular rhythm.     Pulses: Normal pulses.     Heart sounds: Normal heart sounds.  Pulmonary:     Breath sounds: Normal breath sounds.  Abdominal:     Palpations: Abdomen is soft.  Musculoskeletal:     Comments: 1+ edema  Skin:    General:  Skin is warm and dry.  Neurological:     Mental Status: She is alert and oriented to person, place, and time. Mental status is at baseline.  Psychiatric:        Mood and Affect: Mood normal.        Behavior: Behavior normal.        Thought Content: Thought content normal.        Judgment: Judgment normal.        No results found for any visits on 03/27/20.  Assessment & Plan     1. Acute on chronic combined systolic and diastolic CHF (congestive heart failure) (HCC) Change metolazone from daily to Monday, Wednesday, Friday.  Follow-up 1 month.  Make sure potassium is okay.  All risk factors treated.  Patient tolerating Entresto.More than 50% 25 minute visit spent in counseling or coordination of care  - CBC with Differential - Renal Function Panel  2. Dyspnea, unspecified type  - CBC with Differential - Renal Function Panel  3. Acute blood loss anemia Follow-up CBC C. - CBC with Differential - Renal Function Panel   Return in about 1 month (around 04/27/2020).         Nickcole Bralley Cranford Mon, MD  [facility] [phone] (phone) [phone] (fax)  [place]

## 2020-03-27 NOTE — Patient Instructions (Addendum)
Change metolazone from daily to Monday, Wednesday, Friday. Follow up in one month.

## 2020-03-28 DIAGNOSIS — H353231 Exudative age-related macular degeneration, bilateral, with active choroidal neovascularization: Secondary | ICD-10-CM | POA: Diagnosis not present

## 2020-03-28 LAB — CBC WITH DIFFERENTIAL/PLATELET
Basophils Absolute: 0.1 10*3/uL (ref 0.0–0.2)
Basos: 1 %
EOS (ABSOLUTE): 0.2 10*3/uL (ref 0.0–0.4)
Eos: 3 %
Hematocrit: 38.6 % (ref 34.0–46.6)
Hemoglobin: 12.5 g/dL (ref 11.1–15.9)
Immature Grans (Abs): 0 10*3/uL (ref 0.0–0.1)
Immature Granulocytes: 0 %
Lymphocytes Absolute: 2.9 10*3/uL (ref 0.7–3.1)
Lymphs: 35 %
MCH: 27.4 pg (ref 26.6–33.0)
MCHC: 32.4 g/dL (ref 31.5–35.7)
MCV: 85 fL (ref 79–97)
Monocytes Absolute: 0.9 10*3/uL (ref 0.1–0.9)
Monocytes: 11 %
Neutrophils Absolute: 4.1 10*3/uL (ref 1.4–7.0)
Neutrophils: 50 %
Platelets: 264 10*3/uL (ref 150–450)
RBC: 4.57 x10E6/uL (ref 3.77–5.28)
RDW: 16.5 % — ABNORMAL HIGH (ref 11.7–15.4)
WBC: 8.1 10*3/uL (ref 3.4–10.8)

## 2020-03-28 LAB — RENAL FUNCTION PANEL
Albumin: 3.9 g/dL (ref 3.6–4.6)
BUN/Creatinine Ratio: 17 (ref 12–28)
BUN: 12 mg/dL (ref 8–27)
CO2: 24 mmol/L (ref 20–29)
Calcium: 9.4 mg/dL (ref 8.7–10.3)
Chloride: 90 mmol/L — ABNORMAL LOW (ref 96–106)
Creatinine, Ser: 0.7 mg/dL (ref 0.57–1.00)
GFR calc Af Amer: 93 mL/min/{1.73_m2} (ref >59)
GFR calc non Af Amer: 81 mL/min/{1.73_m2} (ref >59)
Glucose: 109 mg/dL — ABNORMAL HIGH (ref 65–99)
Phosphorus: 3.8 mg/dL (ref 3.0–4.3)
Potassium: 3.5 mmol/L (ref 3.5–5.2)
Sodium: 132 mmol/L — ABNORMAL LOW (ref 134–144)

## 2020-04-02 ENCOUNTER — Encounter: Payer: Medicare Other | Admitting: Family Medicine

## 2020-04-04 ENCOUNTER — Ambulatory Visit (INDEPENDENT_AMBULATORY_CARE_PROVIDER_SITE_OTHER): Payer: Medicare Other

## 2020-04-04 DIAGNOSIS — I509 Heart failure, unspecified: Secondary | ICD-10-CM

## 2020-04-04 DIAGNOSIS — I10 Essential (primary) hypertension: Secondary | ICD-10-CM

## 2020-04-04 NOTE — Chronic Care Management (AMB) (Signed)
Chronic Care Management   Follow Up Note   04/04/2020 Name: Teresa Moyer MRN: [id number] DOB: 1935/08/12  Primary Care Provider: Jerrol Banana., MD Reason for referral : Chronic Care Management  Teresa Moyer is a 85 y.o. year old female who is a primary care patient of Jerrol Banana., MD. She is currently engaged with the chronic care management team. A routine telephonic outreach was conducted today.  Review of Teresa Moyer's status, including review of consultants reports, relevant labs and test results was conducted today. Collaboration with appropriate care team members was performed as part of the comprehensive evaluation and provision of chronic care management services.    SDOH (Social Determinants of Health) assessments performed: No    Outpatient Encounter Medications as of 04/04/2020  Medication Sig Note  . acetaminophen (TYLENOL) 650 MG CR tablet Take 1,300 mg by mouth every 8 (eight) hours.    Marland Kitchen atorvastatin (LIPITOR) 40 MG tablet Take 1 tablet (40 mg total) by mouth daily at 6 PM.   . Carboxymethylcell-Hypromellose (GENTEAL) 0.25-0.3 % GEL Apply 1 application to eye at bedtime.    . carvedilol (COREG) 12.5 MG tablet Take 12.5 mg by mouth 2 (two) times daily with a meal.    . cetirizine (ZYRTEC ALLERGY) 10 MG tablet Take 10 mg by mouth at bedtime.    . fluticasone (FLONASE) 50 MCG/ACT nasal spray Place 1 spray into both nostrils daily.    . furosemide (LASIX) 20 MG tablet Take 1 tablet (20 mg total) by mouth 2 (two) times daily.   . metolazone (ZAROXOLYN) 2.5 MG tablet Take 1 tablet (2.5 mg total) by mouth daily.   . Multiple Vitamins-Minerals (PRESERVISION AREDS PO) Take 1 tablet by mouth 2 (two) times daily.    Marland Kitchen omeprazole (PRILOSEC) 20 MG capsule TAKE 1 CAPSULE BY MOUTH ONCE DAILY (Patient taking differently: Take 20 mg by mouth every morning. )   . ondansetron (ZOFRAN-ODT) 4 MG disintegrating tablet Take 1 tablet (4 mg total) by mouth every 8 (eight)  hours as needed for nausea or vomiting.   . potassium chloride (KLOR-CON 10) 10 MEQ tablet Take 1 tablet (10 mEq total) by mouth 2 (two) times daily.   . Probiotic Product (ALIGN) 4 MG CAPS Take 4 mg by mouth daily after lunch.  02/08/2020: Reports currently taking 2 a day  . sacubitril-valsartan (ENTRESTO) 49-51 MG Take by mouth.   . traMADol (ULTRAM) 50 MG tablet Take 0.5 tablets (25 mg total) by mouth every 6 (six) hours as needed for moderate pain. Take 1/2 tablet by mouth every 4 hours PRN, moderate pain    No facility-administered encounter medications on file as of 04/04/2020.       Goals Addressed            This Visit's Progress   . Chronic Disease Management       CARE PLAN ENTRY (see longitudinal plan of care for additional care plan information)  Current Barriers:  . Chronic Disease Management support and education needs related to HTN, CAP, CHF, GERD, and HLD. (Hx of Skin Ca)   Case Manager Clinical Goal(s):  Over the next 90 days, patient will: . Not require hospitalization d/t complications r/t chronic illnesses. Over the next 120 days, patient will: . Continue to take all medications as prescribed. . Continue attending appointments as scheduled. . Monitor blood pressure and record readings. . Monitor weight and record readings. . Adhere to recommended cardiac prudent/heart healthy diet. . Follow recommended  safety precautions to prevent falls and injuries.   Interventions:  . Inter-disciplinary care team collaboration (see longitudinal plan of care) . Reviewed medications and compliance with treatment recommendations. Reports taking all medications as prescribed. Reports not consistently monitoring BP. Reviewed established parameters and current goal to maintain below 140/90. Encouraged to monitor and record readings.   . Discussed recent weights and symptoms r/t CHF. Reports daily weights have been within range. Morning weight = 132 lbs. Reports mild edema to  her left leg. Denies chest discomfort, palpitations or shortness of breath. No changes or decline in activity tolerance.  . Reviewed safety and fall prevention measures. Reports ambulating well. No recent fall. Pending follow up with the Ortho team next week.   . Discussed plans for care management follow-up.  Teresa Moyer reports doing well today. Discussed recommendation regarding the Influenza vaccine. Reports completing the [place] Covid-19 vaccination series in February. Considering options for obtaining the booster. Will follow-up to assist if needed. No urgent concerns or changes in care management needs.     Patient Self Care Activities:  . Self administers medications as prescribed . Attends all scheduled provider appointments . Calls pharmacy for medication refills . Performs ADL's independently . Performs IADL's independently . Calls provider office for new concerns or questions   Please see past updates related to this goal by clicking on the "Past Updates" button in the selected goal         PLAN:  A member of the care management team will follow-up with Teresa Moyer next month.    [facility] Care Management [facility] [phone]

## 2020-04-18 DIAGNOSIS — I350 Nonrheumatic aortic (valve) stenosis: Secondary | ICD-10-CM | POA: Diagnosis not present

## 2020-04-18 DIAGNOSIS — I2510 Atherosclerotic heart disease of native coronary artery without angina pectoris: Secondary | ICD-10-CM | POA: Diagnosis not present

## 2020-04-18 DIAGNOSIS — I38 Endocarditis, valve unspecified: Secondary | ICD-10-CM | POA: Diagnosis not present

## 2020-04-18 DIAGNOSIS — I10 Essential (primary) hypertension: Secondary | ICD-10-CM | POA: Diagnosis not present

## 2020-04-18 DIAGNOSIS — I340 Nonrheumatic mitral (valve) insufficiency: Secondary | ICD-10-CM | POA: Diagnosis not present

## 2020-04-18 DIAGNOSIS — E782 Mixed hyperlipidemia: Secondary | ICD-10-CM | POA: Diagnosis not present

## 2020-04-18 DIAGNOSIS — I5022 Chronic systolic (congestive) heart failure: Secondary | ICD-10-CM | POA: Diagnosis not present

## 2020-04-18 DIAGNOSIS — R0600 Dyspnea, unspecified: Secondary | ICD-10-CM | POA: Diagnosis not present

## 2020-04-25 NOTE — Patient Instructions (Addendum)
Thank you for allowing the Chronic Care Management team to participate in your care.   Goals Addressed            This Visit's Progress   . Chronic Disease Management       CARE PLAN ENTRY (see longitudinal plan of care for additional care plan information)  Current Barriers:  . Chronic Disease Management support and education needs related to HTN, CAP, CHF, GERD, and HLD. (Hx of Skin Ca)   Case Manager Clinical Goal(s):  Over the next 90 days, patient will: . Not require hospitalization d/t complications r/t chronic illnesses. Over the next 120 days, patient will: . Continue to take all medications as prescribed. . Continue attending appointments as scheduled. . Monitor blood pressure and record readings. . Monitor weight and record readings. . Adhere to recommended cardiac prudent/heart healthy diet. . Follow recommended safety precautions to prevent falls and injuries.   Interventions:  . Inter-disciplinary care team collaboration (see longitudinal plan of care) . Reviewed medications and compliance with treatment recommendations. Reports taking all medications as prescribed. Reports not consistently monitoring BP. Reviewed established parameters and current goal to maintain below 140/90. Encouraged to monitor and record readings.   . Discussed recent weights and symptoms r/t CHF. Reports daily weights have been within range. Morning weight = 132 lbs. Reports mild edema to her left leg. Denies chest discomfort, palpitations or shortness of breath. No changes or decline in activity tolerance.  . Reviewed safety and fall prevention measures. Reports ambulating well. No recent fall. Pending follow up with the Ortho team next week.   . Discussed plans for care management follow-up.  Ms. Gadway reports doing well today. Discussed recommendation regarding the Influenza vaccine. Reports completing the [place] Covid-19 vaccination series in February. Considering options for obtaining the  booster. Will follow-up to assist if needed. No urgent concerns or changes in care management needs.     Patient Self Care Activities:  . Self administers medications as prescribed . Attends all scheduled provider appointments . Calls pharmacy for medication refills . Performs ADL's independently . Performs IADL's independently . Calls provider office for new concerns or questions   Please see past updates related to this goal by clicking on the "Past Updates" button in the selected goal         Ms. Mordecai verbalized understanding of the information discussed during the telephonic outreach today. Declined need for a mailed/printed copy of the instructions.    A member of the care management team will follow-up with Ms. Danford next month.    [facility] Care Management [facility] [phone]

## 2020-04-29 ENCOUNTER — Other Ambulatory Visit: Payer: Self-pay

## 2020-04-29 ENCOUNTER — Ambulatory Visit (INDEPENDENT_AMBULATORY_CARE_PROVIDER_SITE_OTHER): Payer: Medicare Other | Admitting: Family Medicine

## 2020-04-29 ENCOUNTER — Encounter: Payer: Self-pay | Admitting: Family Medicine

## 2020-04-29 VITALS — BP 136/78 | HR 76 | Temp 98.4°F | Resp 16 | Ht 62.0 in | Wt 139.0 lb

## 2020-04-29 DIAGNOSIS — D62 Acute posthemorrhagic anemia: Secondary | ICD-10-CM | POA: Diagnosis not present

## 2020-04-29 DIAGNOSIS — E782 Mixed hyperlipidemia: Secondary | ICD-10-CM

## 2020-04-29 DIAGNOSIS — I2510 Atherosclerotic heart disease of native coronary artery without angina pectoris: Secondary | ICD-10-CM

## 2020-04-29 DIAGNOSIS — I10 Essential (primary) hypertension: Secondary | ICD-10-CM

## 2020-04-29 DIAGNOSIS — Z23 Encounter for immunization: Secondary | ICD-10-CM | POA: Diagnosis not present

## 2020-04-29 DIAGNOSIS — I5043 Acute on chronic combined systolic (congestive) and diastolic (congestive) heart failure: Secondary | ICD-10-CM

## 2020-04-29 DIAGNOSIS — I25118 Atherosclerotic heart disease of native coronary artery with other forms of angina pectoris: Secondary | ICD-10-CM | POA: Diagnosis not present

## 2020-04-29 NOTE — Progress Notes (Signed)
I,Teresa Moyer,acting as a scribe for Teresa Durie, MD.,have documented all relevant documentation on the behalf of Teresa Durie, MD,as directed by  Teresa Durie, MD while in the presence of Teresa Durie, MD.   Established patient visit   Patient: Teresa Moyer   DOB: [birth year]   84 y.o. Female  MRN: [id number] Visit Date: 04/29/2020  Today's healthcare provider: [name], MD   Chief Complaint  Patient presents with  . Follow-up   Subjective    HPI  Acute on chronic combined systolic and diastolic CHF (congestive heart failure) ([place]) From 03/27/2020-Change metolazone from daily to Monday, Wednesday, Friday.  Follow-up 1 month.  Make sure potassium is okay.  All risk factors treated.  Patient tolerating Entresto. Patient completely off metolazone.  She is feeling much better since starting Entresto.  Has follow-up echocardiogram in the near future. Dyspnea, unspecified type From 03/27/2020-labs checked showing-stable.  Acute blood loss anemia From 03/27/2020-labs stable.  Past Medical History:  Diagnosis Date  . Cataracts, bilateral   . CHF (congestive heart failure) ([place]) 02/2019  . Coronary artery disease   . Environmental allergies   . Family history of adverse reaction to anesthesia    sister-nauseated  . GERD (gastroesophageal reflux disease)   . Heart murmur 2019   had since a child. dr. Nehemiah Massed follows d/t getting louder  . Hemorrhoids   . History of chickenpox   . Hypertension   . Incontinence in female   . Macular degeneration   . Myocardial infarction ([place]) 01/2019   1 stent   . Osteoarthritis   . Skin cancer 12/2017/11-2019   BCC. nose removed from nose last week.  shoulder squamos cell removed previously  . Type O blood, Rh negative 12/2017   patient requests that this be present on her chart       Medications: Outpatient Medications Prior to Visit  Medication Sig  . acetaminophen (TYLENOL) 650 MG CR tablet  Take 1,300 mg by mouth every 8 (eight) hours.   Marland Kitchen atorvastatin (LIPITOR) 40 MG tablet Take 1 tablet (40 mg total) by mouth daily at 6 PM.  . Carboxymethylcell-Hypromellose (GENTEAL) 0.25-0.3 % GEL Apply 1 application to eye at bedtime.   . carvedilol (COREG) 12.5 MG tablet Take 12.5 mg by mouth 2 (two) times daily with a meal.   . cetirizine (ZYRTEC ALLERGY) 10 MG tablet Take 10 mg by mouth at bedtime.   . fluticasone (FLONASE) 50 MCG/ACT nasal spray Place 1 spray into both nostrils daily.   . furosemide (LASIX) 20 MG tablet Take 1 tablet (20 mg total) by mouth 2 (two) times daily.  . metolazone (ZAROXOLYN) 2.5 MG tablet Take 1 tablet (2.5 mg total) by mouth daily.  . Multiple Vitamins-Minerals (PRESERVISION AREDS PO) Take 1 tablet by mouth 2 (two) times daily.   Marland Kitchen omeprazole (PRILOSEC) 20 MG capsule TAKE 1 CAPSULE BY MOUTH ONCE DAILY (Patient taking differently: Take 20 mg by mouth every morning. )  . ondansetron (ZOFRAN-ODT) 4 MG disintegrating tablet Take 1 tablet (4 mg total) by mouth every 8 (eight) hours as needed for nausea or vomiting.  . potassium chloride (KLOR-CON 10) 10 MEQ tablet Take 1 tablet (10 mEq total) by mouth 2 (two) times daily.  . Probiotic Product (ALIGN) 4 MG CAPS Take 4 mg by mouth daily after lunch.   . sacubitril-valsartan (ENTRESTO) 49-51 MG Take by mouth.  . traMADol (ULTRAM) 50 MG tablet Take 0.5 tablets (25 mg total) by  mouth every 6 (six) hours as needed for moderate pain. Take 1/2 tablet by mouth every 4 hours PRN, moderate pain   No facility-administered medications prior to visit.    Review of Systems  Constitutional: Negative for appetite change, chills, fatigue and fever.  Respiratory: Negative for chest tightness and shortness of breath.   Cardiovascular: Negative for chest pain and palpitations.  Gastrointestinal: Negative for abdominal pain, nausea and vomiting.  Neurological: Negative for dizziness and weakness.    Last metabolic panel Lab Results   Component Value Date   GLUCOSE 109 (H) 03/27/2020   NA 132 (L) 03/27/2020   K 3.5 03/27/2020   CL 90 (L) 03/27/2020   CO2 24 03/27/2020   BUN 12 03/27/2020   CREATININE 0.70 03/27/2020   GFRNONAA 81 03/27/2020   GFRAA 93 03/27/2020   CALCIUM 9.4 03/27/2020   PHOS 3.8 03/27/2020   PROT 6.2 02/14/2020   ALBUMIN 3.9 03/27/2020   LABGLOB 2.3 02/14/2020   AGRATIO 1.7 02/14/2020   BILITOT 1.1 02/14/2020   ALKPHOS 114 02/14/2020   AST 15 02/14/2020   ALT 10 02/14/2020   ANIONGAP 10 01/06/2020      Objective    BP 136/78 (BP Location: Right Arm, Patient Position: Sitting, Cuff Size: Normal)   Pulse 76   Temp 98.4 F (36.9 C) (Oral)   Resp 16   Ht 5\' 2"  (1.575 m)   Wt 139 lb (63 kg)   SpO2 97%   BMI 25.42 kg/m  BP Readings from Last 3 Encounters:  04/29/20 136/78  03/27/20 (!) 141/67  03/19/20 (!) 152/96   Wt Readings from Last 3 Encounters:  04/29/20 139 lb (63 kg)  03/27/20 140 lb 12.8 oz (63.9 kg)  03/19/20 143 lb (64.9 kg)      Physical Exam Vitals reviewed.  Constitutional:      Appearance: Normal appearance.  HENT:     Head: Normocephalic and atraumatic.     Right Ear: External ear normal.     Left Ear: External ear normal.  Eyes:     General: No scleral icterus.    Conjunctiva/sclera: Conjunctivae normal.  Cardiovascular:     Rate and Rhythm: Normal rate and regular rhythm.     Pulses: Normal pulses.     Heart sounds: Normal heart sounds.  Pulmonary:     Breath sounds: Normal breath sounds.  Abdominal:     Palpations: Abdomen is soft.  Musculoskeletal:     Comments: 1+ edema  Skin:    General: Skin is warm and dry.  Neurological:     Mental Status: She is alert and oriented to person, place, and time. Mental status is at baseline.  Psychiatric:        Mood and Affect: Mood normal.        Behavior: Behavior normal.        Thought Content: Thought content normal.        Judgment: Judgment normal.       No results found for any visits on  04/29/20.  Assessment & Plan     1. Encounter for immunization Patient gets COVID booster today. - Pfizer SARS-COV-2 Vaccine  2. Acute on chronic combined systolic and diastolic CHF (congestive heart failure) ([place]) Doing well on Entresto.  Follow-up with cardiology with echo in the near future.  Follow weights off of metolazone  3. Coronary artery disease of native artery of native heart with stable angina pectoris ([place]) All risk factors treated  4. Hypertension, unspecified type  Blood pressure good.  On Coreg plus Entresto for CHF  5. Acute blood loss anemia CBC.  This has resolved doubly.  6. Mixed hyperlipidemia On atorvastatin   No follow-ups on file.         Alegria Dominique Cranford Mon, MD  [facility] [phone] (phone) [phone] (fax)  [place]

## 2020-04-30 ENCOUNTER — Other Ambulatory Visit: Payer: Self-pay | Admitting: Family Medicine

## 2020-04-30 DIAGNOSIS — Z1231 Encounter for screening mammogram for malignant neoplasm of breast: Secondary | ICD-10-CM

## 2020-05-02 DIAGNOSIS — H353231 Exudative age-related macular degeneration, bilateral, with active choroidal neovascularization: Secondary | ICD-10-CM | POA: Diagnosis not present

## 2020-05-03 DIAGNOSIS — I5022 Chronic systolic (congestive) heart failure: Secondary | ICD-10-CM | POA: Diagnosis not present

## 2020-05-03 DIAGNOSIS — I2510 Atherosclerotic heart disease of native coronary artery without angina pectoris: Secondary | ICD-10-CM | POA: Diagnosis not present

## 2020-05-03 DIAGNOSIS — I350 Nonrheumatic aortic (valve) stenosis: Secondary | ICD-10-CM | POA: Diagnosis not present

## 2020-05-03 DIAGNOSIS — Z23 Encounter for immunization: Secondary | ICD-10-CM | POA: Diagnosis not present

## 2020-05-03 DIAGNOSIS — R011 Cardiac murmur, unspecified: Secondary | ICD-10-CM | POA: Diagnosis not present

## 2020-05-03 DIAGNOSIS — I10 Essential (primary) hypertension: Secondary | ICD-10-CM | POA: Diagnosis not present

## 2020-05-03 DIAGNOSIS — I340 Nonrheumatic mitral (valve) insufficiency: Secondary | ICD-10-CM | POA: Diagnosis not present

## 2020-05-03 DIAGNOSIS — I38 Endocarditis, valve unspecified: Secondary | ICD-10-CM | POA: Diagnosis not present

## 2020-05-06 DIAGNOSIS — H93293 Other abnormal auditory perceptions, bilateral: Secondary | ICD-10-CM | POA: Diagnosis not present

## 2020-05-06 DIAGNOSIS — H6123 Impacted cerumen, bilateral: Secondary | ICD-10-CM | POA: Diagnosis not present

## 2020-05-06 DIAGNOSIS — J301 Allergic rhinitis due to pollen: Secondary | ICD-10-CM | POA: Diagnosis not present

## 2020-05-07 DIAGNOSIS — D2262 Melanocytic nevi of left upper limb, including shoulder: Secondary | ICD-10-CM | POA: Diagnosis not present

## 2020-05-07 DIAGNOSIS — D2272 Melanocytic nevi of left lower limb, including hip: Secondary | ICD-10-CM | POA: Diagnosis not present

## 2020-05-07 DIAGNOSIS — Z85828 Personal history of other malignant neoplasm of skin: Secondary | ICD-10-CM | POA: Diagnosis not present

## 2020-05-07 DIAGNOSIS — D2261 Melanocytic nevi of right upper limb, including shoulder: Secondary | ICD-10-CM | POA: Diagnosis not present

## 2020-05-07 DIAGNOSIS — L821 Other seborrheic keratosis: Secondary | ICD-10-CM | POA: Diagnosis not present

## 2020-05-07 DIAGNOSIS — D225 Melanocytic nevi of trunk: Secondary | ICD-10-CM | POA: Diagnosis not present

## 2020-05-07 DIAGNOSIS — R17 Unspecified jaundice: Secondary | ICD-10-CM | POA: Diagnosis not present

## 2020-05-08 ENCOUNTER — Other Ambulatory Visit: Payer: Self-pay

## 2020-05-08 ENCOUNTER — Encounter: Payer: Self-pay | Admitting: Physician Assistant

## 2020-05-08 ENCOUNTER — Ambulatory Visit: Payer: Medicare Other | Admitting: Family

## 2020-05-08 ENCOUNTER — Ambulatory Visit (INDEPENDENT_AMBULATORY_CARE_PROVIDER_SITE_OTHER): Payer: Medicare Other | Admitting: Physician Assistant

## 2020-05-08 ENCOUNTER — Ambulatory Visit: Payer: Self-pay

## 2020-05-08 VITALS — BP 148/88 | HR 88 | Temp 98.0°F | Resp 16

## 2020-05-08 DIAGNOSIS — R0602 Shortness of breath: Secondary | ICD-10-CM | POA: Diagnosis not present

## 2020-05-08 DIAGNOSIS — I2510 Atherosclerotic heart disease of native coronary artery without angina pectoris: Secondary | ICD-10-CM

## 2020-05-08 DIAGNOSIS — I5042 Chronic combined systolic (congestive) and diastolic (congestive) heart failure: Secondary | ICD-10-CM | POA: Diagnosis not present

## 2020-05-08 DIAGNOSIS — R17 Unspecified jaundice: Secondary | ICD-10-CM | POA: Diagnosis not present

## 2020-05-08 NOTE — Patient Instructions (Signed)
Add one extra lasix (furosemide) daily  Stop Tylenol

## 2020-05-08 NOTE — Progress Notes (Signed)
Established patient visit   Patient: Teresa Moyer   DOB: 1935-12-09   84 y.o. Female  MRN: [id number] Visit Date: 05/08/2020  Today's healthcare provider: Mar Daring, PA-C   Chief Complaint  Patient presents with  . Shortness of Breath   Subjective    Shortness of Breath Chronicity: reports that she has chronic sob ever since her knee surgery 12/22/19, but over the last 2 days it has worsened. The problem occurs constantly (with activity only). The problem has been gradually worsening. Pertinent negatives include no abdominal pain, chest pain, fever, headaches, leg pain, leg swelling, orthopnea, PND, rhinorrhea, sore throat, sputum production, vomiting or wheezing. The symptoms are aggravated by any activity. Associated symptoms comments: Eyes and skin are yellowing. She has tried rest (Reports that she has been taking extra strenght tylenol 6 a day for knee pain since her surgery) for the symptoms. The treatment provided moderate relief. Her past medical history is significant for a heart failure and a recent surgery (July). There is no history of allergies, aspirin allergies, asthma or DVT.    She is followed by Cardiology. Reports that her Cardiologist advised to follow up with her PCP. Told her that she is jaundice and that she needs labs. Feels it may be why she is sob.  Saw her Dermatologist and he is who told her she was jaundiced and had yellowing of her eyes.    Patient Active Problem List   Diagnosis Date Noted  . Acute CHF (congestive heart failure) ([place]) 01/04/2020  . Acute blood loss anemia   . Hyponatremia with excess extracellular fluid volume   . Total knee replacement status 12/20/2019  . Acute on chronic combined systolic and diastolic CHF (congestive heart failure) ([place]) 09/06/2019  . Acute respiratory failure with hypoxia ([place]) 01/30/2019  . Coronary artery disease involving native coronary artery of native heart 01/11/2019  . STEMI (ST elevation  myocardial infarction) ([place]) 12/27/2018  . STEMI involving left anterior descending coronary artery ([place]) 12/27/2018  . Acute on chronic cholecystitis 02/16/2018  . Gallstones 12/15/2017  . Actinic keratosis 12/18/2014  . Allergic rhinitis 12/18/2014  . Arthritis 12/18/2014  . Basal cell carcinoma of skin 12/18/2014  . Gonalgia 12/18/2014  . Narrowing of intervertebral disc space 12/18/2014  . Hypertension 12/18/2014  . Gastro-esophageal reflux disease without esophagitis 12/18/2014  . HLD (hyperlipidemia) 12/18/2014  . Cardiac murmur 12/18/2014  . Arthritis, degenerative 12/18/2014  . Hypertonicity of bladder 12/18/2014  . Detrusor muscle hypertonia 12/18/2014  . Heart valve disease 12/18/2014  . Asymptomatic varicose veins 12/18/2014   Past Medical History:  Diagnosis Date  . Cataracts, bilateral   . CHF (congestive heart failure) ([place]) 02/2019  . Coronary artery disease   . Environmental allergies   . Family history of adverse reaction to anesthesia    sister-nauseated  . GERD (gastroesophageal reflux disease)   . Heart murmur 2019   had since a child. dr. Nehemiah Massed follows d/t getting louder  . Hemorrhoids   . History of chickenpox   . Hypertension   . Incontinence in female   . Macular degeneration   . Myocardial infarction ([place]) 01/2019   1 stent   . Osteoarthritis   . Skin cancer 12/2017/11-2019   BCC. nose removed from nose last week.  shoulder squamos cell removed previously  . Type O blood, Rh negative 12/2017   patient requests that this be present on her chart       Medications: Outpatient Medications Prior  to Visit  Medication Sig  . acetaminophen (TYLENOL) 650 MG CR tablet Take 1,300 mg by mouth every 8 (eight) hours.   Marland Kitchen atorvastatin (LIPITOR) 40 MG tablet Take 1 tablet (40 mg total) by mouth daily at 6 PM.  . Carboxymethylcell-Hypromellose (GENTEAL) 0.25-0.3 % GEL Apply 1 application to eye at bedtime.   . carvedilol (COREG) 12.5 MG tablet Take 12.5  mg by mouth 2 (two) times daily with a meal.   . cetirizine (ZYRTEC ALLERGY) 10 MG tablet Take 10 mg by mouth at bedtime.   . fluticasone (FLONASE) 50 MCG/ACT nasal spray Place 1 spray into both nostrils daily.   . furosemide (LASIX) 20 MG tablet Take 1 tablet (20 mg total) by mouth 2 (two) times daily.  . metolazone (ZAROXOLYN) 2.5 MG tablet Take 1 tablet (2.5 mg total) by mouth daily.  . Multiple Vitamins-Minerals (PRESERVISION AREDS PO) Take 1 tablet by mouth 2 (two) times daily.   Marland Kitchen omeprazole (PRILOSEC) 20 MG capsule TAKE 1 CAPSULE BY MOUTH ONCE DAILY (Patient taking differently: Take 20 mg by mouth every morning. )  . ondansetron (ZOFRAN-ODT) 4 MG disintegrating tablet Take 1 tablet (4 mg total) by mouth every 8 (eight) hours as needed for nausea or vomiting.  . potassium chloride (KLOR-CON 10) 10 MEQ tablet Take 1 tablet (10 mEq total) by mouth 2 (two) times daily.  . Probiotic Product (ALIGN) 4 MG CAPS Take 4 mg by mouth daily after lunch.   . sacubitril-valsartan (ENTRESTO) 49-51 MG Take by mouth.  . traMADol (ULTRAM) 50 MG tablet Take 0.5 tablets (25 mg total) by mouth every 6 (six) hours as needed for moderate pain. Take 1/2 tablet by mouth every 4 hours PRN, moderate pain   No facility-administered medications prior to visit.    Review of Systems  Constitutional: Positive for fatigue. Negative for activity change, appetite change, chills, diaphoresis, fever and unexpected weight change.  HENT: Negative for congestion, postnasal drip, rhinorrhea, sinus pressure, sinus pain and sore throat.   Respiratory: Positive for shortness of breath. Negative for sputum production, chest tightness and wheezing.   Cardiovascular: Negative for chest pain, palpitations, orthopnea, leg swelling and PND.  Gastrointestinal: Negative for abdominal distention, abdominal pain, blood in stool, constipation, diarrhea, nausea and vomiting.  Neurological: Negative for dizziness, light-headedness and  headaches.    Last CBC Lab Results  Component Value Date   WBC 8.1 03/27/2020   HGB 12.5 03/27/2020   HCT 38.6 03/27/2020   MCV 85 03/27/2020   MCH 27.4 03/27/2020   RDW 16.5 (H) 03/27/2020   PLT 264 56/21/3086   Last metabolic panel Lab Results  Component Value Date   GLUCOSE 109 (H) 03/27/2020   NA 132 (L) 03/27/2020   K 3.5 03/27/2020   CL 90 (L) 03/27/2020   CO2 24 03/27/2020   BUN 12 03/27/2020   CREATININE 0.70 03/27/2020   GFRNONAA 81 03/27/2020   GFRAA 93 03/27/2020   CALCIUM 9.4 03/27/2020   PHOS 3.8 03/27/2020   PROT 6.2 02/14/2020   ALBUMIN 3.9 03/27/2020   LABGLOB 2.3 02/14/2020   AGRATIO 1.7 02/14/2020   BILITOT 1.1 02/14/2020   ALKPHOS 114 02/14/2020   AST 15 02/14/2020   ALT 10 02/14/2020   ANIONGAP 10 01/06/2020      Objective    BP (!) 148/88 (BP Location: Right Arm, Patient Position: Sitting, Cuff Size: Large)   Pulse 88   Temp 98 F (36.7 C) (Oral)   Resp 16   SpO2 95% Comment:  94-95% BP Readings from Last 3 Encounters:  05/08/20 (!) 148/88  04/29/20 136/78  03/27/20 (!) 141/67   Wt Readings from Last 3 Encounters:  04/29/20 139 lb (63 kg)  03/27/20 140 lb 12.8 oz (63.9 kg)  03/19/20 143 lb (64.9 kg)      Physical Exam Vitals reviewed.  Constitutional:      General: She is not in acute distress.    Appearance: Normal appearance. She is well-developed and normal weight. She is not ill-appearing or diaphoretic.  HENT:     Head: Normocephalic and atraumatic.  Eyes:     General: Lids are normal. Vision grossly intact. Scleral icterus (mild) present.        Right eye: No discharge.        Left eye: No discharge.     Extraocular Movements: Extraocular movements intact.     Conjunctiva/sclera: Conjunctivae normal.     Pupils: Pupils are equal, round, and reactive to light.     Comments: Does have macular degeneration and recently had her shots put in  Cardiovascular:     Rate and Rhythm: Normal rate and regular rhythm.      Pulses: Normal pulses.     Heart sounds: Murmur heard.  No friction rub. No gallop.   Pulmonary:     Effort: Pulmonary effort is normal. No respiratory distress.     Breath sounds: Normal breath sounds. No decreased breath sounds, wheezing, rhonchi or rales.  Abdominal:     General: Abdomen is flat. Bowel sounds are normal. There is no distension.     Palpations: Abdomen is soft. There is no hepatomegaly, splenomegaly or mass.     Tenderness: There is no abdominal tenderness. There is no guarding or rebound.     Comments: Suprapubic tenderness  Musculoskeletal:     Right lower leg: No edema.     Left lower leg: No edema.  Skin:    General: Skin is warm and dry.  Neurological:     Mental Status: She is alert and oriented to person, place, and time.      No results found for any visits on 05/08/20.  Assessment & Plan     1. SOB (shortness of breath) on exertion Worsening over last 2 days. Labs ordered as below. EKG today shows sinus rhythm with LAE rate of 81, old infarct (known patient with h/o STEMI). Stable from July 2021. Discussed with Dr. Rosanna Randy over the phone. Patient advised to take an extra furosemide 20mg  daily until she sees [place] back next week. Advised if SOB worsens or develops other symptoms she will need to go to the ER. She voices understanding.  - CBC w/Diff/Platelet - Basic Metabolic Panel (BMET) - Hepatic function panel - Pro b natriuretic peptide (BNP)[facility]) - EKG 12-Lead  2. Chronic combined systolic and diastolic heart failure ([place]) Appears euvolemic on exam. Patient reports weight stable at home. Unfortunately our scale malfunctioned and a weight was not obtainable today in the office. Patient had no respiratory abnormalities or lower extremity swelling today. Abdomen soft as well, no appreciable ascites. Will check labs and f/u next week.  - CBC w/Diff/Platelet - Basic Metabolic Panel (BMET) - Hepatic function panel - Pro b  natriuretic peptide (BNP)[facility])  3. Jaundice New. Noted in eyes slightly. Reports dermatologist mentioned her skin appearing more yellow as well. This was my first time seeing patient so I could not appreciate skin changes, would be very mild, if any. I could  appreciate some mild yellowing in the sclera, mostly noted in the right eye. Advised patient to stop all tylenol products. Labs checked. Have ordered a RUQ [place] to evaluate liver. Reassuring exam. Labs had been normal in October. Will f/u next week.  - CBC w/Diff/Platelet - Basic Metabolic Panel (BMET) - Hepatic function panel - US Abdomen Limited RUQ (LIVER/GB); Future   No follow-ups on file.      Reynolds Bowl, PA-C, have reviewed all documentation for this visit. The documentation on 05/08/20 for the exam, diagnosis, procedures, and orders are all accurate and complete.   Rubye Beach  [facility] [phone] (phone) [phone] (fax)  [place]

## 2020-05-08 NOTE — Telephone Encounter (Signed)
FYI

## 2020-05-08 NOTE — Telephone Encounter (Signed)
Patient called and says she's been dealing with SOB for over a month. She says she called the cardiologist today and because she hasn't had weight gain, he said not to take more diuretic. He told her she is jaundice and will need to follow up with Dr. Rosanna Randy for lab work and that may be the cause of the SOB. She says her dermatologist told her yesterday that she was jaundice. She says her eyes and skin are yellow. She says she's SOB when she walks, but sitting she's fine. She denies any other symptoms. Appointment scheduled for today at 1500 with Fenton Malling, PA-C, care advice given, patient verbalized understanding.  Reason for Disposition . [1] MILD difficulty breathing (e.g., minimal/no SOB at rest, SOB with walking, pulse <100) AND [2] NEW-onset or WORSE than normal  Answer Assessment - Initial Assessment Questions 1. RESPIRATORY STATUS: "Describe your breathing?" (e.g., wheezing, shortness of breath, unable to speak, severe coughing)      SOB 2. ONSET: "When did this breathing problem begin?"      2 days 3. PATTERN "Does the difficult breathing come and go, or has it been constant since it started?"      Come and go 4. SEVERITY: "How bad is your breathing?" (e.g., mild, moderate, severe)    - MILD: No SOB at rest, mild SOB with walking, speaks normally in sentences, can lay down, no retractions, pulse < 100.    - MODERATE: SOB at rest, SOB with minimal exertion and prefers to sit, cannot lie down flat, speaks in phrases, mild retractions, audible wheezing, pulse 100-120.    - SEVERE: Very SOB at rest, speaks in single words, struggling to breathe, sitting hunched forward, retractions, pulse > 120      Mild 5. RECURRENT SYMPTOM: "Have you had difficulty breathing before?" If Yes, ask: "When was the last time?" and "What happened that time?"       Yes 6. CARDIAC HISTORY: "Do you have any history of heart disease?" (e.g., heart attack, angina, bypass surgery, angioplasty)      Yes 7.  LUNG HISTORY: "Do you have any history of lung disease?"  (e.g., pulmonary embolus, asthma, emphysema)      No 8. CAUSE: "What do you think is causing the breathing problem?"       I don't know 9. OTHER SYMPTOMS: "Do you have any other symptoms? (e.g., dizziness, runny nose, cough, chest pain, fever)      Jaundice 10. PREGNANCY: "Is there any chance you are pregnant?" "When was your last menstrual period?"       No 11. TRAVEL: "Have you traveled out of the country in the last month?" (e.g., travel history, exposures)       No  Protocols used: BREATHING DIFFICULTY-A-AH

## 2020-05-09 LAB — CBC WITH DIFFERENTIAL/PLATELET
Basophils Absolute: 0.1 10*3/uL (ref 0.0–0.2)
Basos: 1 %
EOS (ABSOLUTE): 0.1 10*3/uL (ref 0.0–0.4)
Eos: 2 %
Hematocrit: 32.7 % — ABNORMAL LOW (ref 34.0–46.6)
Hemoglobin: 11.4 g/dL (ref 11.1–15.9)
Immature Grans (Abs): 0 10*3/uL (ref 0.0–0.1)
Immature Granulocytes: 0 %
Lymphocytes Absolute: 3 10*3/uL (ref 0.7–3.1)
Lymphs: 42 %
MCH: 29.9 pg (ref 26.6–33.0)
MCHC: 34.9 g/dL (ref 31.5–35.7)
MCV: 86 fL (ref 79–97)
Monocytes Absolute: 0.6 10*3/uL (ref 0.1–0.9)
Monocytes: 9 %
Neutrophils Absolute: 3.3 10*3/uL (ref 1.4–7.0)
Neutrophils: 46 %
Platelets: 189 10*3/uL (ref 150–450)
RBC: 3.81 x10E6/uL (ref 3.77–5.28)
RDW: 18.5 % — ABNORMAL HIGH (ref 11.7–15.4)
WBC: 7.2 10*3/uL (ref 3.4–10.8)

## 2020-05-09 LAB — BASIC METABOLIC PANEL
BUN/Creatinine Ratio: 14 (ref 12–28)
BUN: 11 mg/dL (ref 8–27)
CO2: 20 mmol/L (ref 20–29)
Calcium: 9 mg/dL (ref 8.7–10.3)
Chloride: 100 mmol/L (ref 96–106)
Creatinine, Ser: 0.77 mg/dL (ref 0.57–1.00)
GFR calc Af Amer: 83 mL/min/{1.73_m2} (ref >59)
GFR calc non Af Amer: 72 mL/min/{1.73_m2} (ref >59)
Glucose: 130 mg/dL — ABNORMAL HIGH (ref 65–99)
Potassium: 3.5 mmol/L (ref 3.5–5.2)
Sodium: 132 mmol/L — ABNORMAL LOW (ref 134–144)

## 2020-05-09 LAB — HEPATIC FUNCTION PANEL
ALT: 14 IU/L (ref 0–32)
AST: 15 IU/L (ref 0–40)
Albumin: 3.9 g/dL (ref 3.6–4.6)
Alkaline Phosphatase: 113 IU/L (ref 44–121)
Bilirubin Total: 1.1 mg/dL (ref 0.0–1.2)
Bilirubin, Direct: 0.4 mg/dL (ref 0.00–0.40)
Total Protein: 6.5 g/dL (ref 6.0–8.5)

## 2020-05-09 LAB — PRO B NATRIURETIC PEPTIDE: NT-Pro BNP: 25432 pg/mL — ABNORMAL HIGH (ref 0–738)

## 2020-05-13 ENCOUNTER — Telehealth: Payer: Self-pay

## 2020-05-13 NOTE — Telephone Encounter (Signed)
-----   Message from Mar Daring, [place] sent at 05/13/2020  1:54 PM EST ----- Labs actually show you are most likely having a heart failure exacerbation. How have symptoms been doing with increasing the fluid pill?

## 2020-05-13 NOTE — Telephone Encounter (Signed)
OK I will talk with Dr. Rosanna Randy and see her Wednesday.   Call if symptoms worsen.

## 2020-05-13 NOTE — Telephone Encounter (Signed)
Pt advised.  She reports still having trouble with shortness of breath but it has improved since increasing her fluid pill.   Thanks,   -Mickel Baas

## 2020-05-14 ENCOUNTER — Ambulatory Visit: Payer: Medicare Other

## 2020-05-14 NOTE — Progress Notes (Signed)
Established patient visit   Patient: Teresa Moyer   DOB: [birth year]   84 y.o. Female  MRN: [id number] Visit Date: 05/15/2020  Today's healthcare provider: Mar Daring, PA-C   Chief Complaint  Patient presents with  . Follow-up   Subjective    HPI  Follow up for SOB  The patient was last seen for this 1 weeks ago. Changes made at last visit include Patient advised to take an extra furosemide 20mg  daily until she sees [place] back next week. Advised if SOB worsens or develops other symptoms she will need to go to the ER.  Fluid pill was increased.  Labs had revealed she was having a heart failure exacerbation. The mild jaundice noted is most likely secondary to hepatic congestion as labs and [place] of liver have been unremarkable.   -----------------------------------------------------------------------------------------   Patient Active Problem List   Diagnosis Date Noted  . Acute CHF (congestive heart failure) ([place]) 01/04/2020  . Acute blood loss anemia   . Hyponatremia with excess extracellular fluid volume   . Total knee replacement status 12/20/2019  . Acute on chronic combined systolic and diastolic CHF (congestive heart failure) ([place]) 09/06/2019  . Acute respiratory failure with hypoxia ([place]) 01/30/2019  . Coronary artery disease involving native coronary artery of native heart 01/11/2019  . STEMI (ST elevation myocardial infarction) ([place]) 12/27/2018  . STEMI involving left anterior descending coronary artery ([place]) 12/27/2018  . Acute on chronic cholecystitis 02/16/2018  . Gallstones 12/15/2017  . Actinic keratosis 12/18/2014  . Allergic rhinitis 12/18/2014  . Arthritis 12/18/2014  . Basal cell carcinoma of skin 12/18/2014  . Gonalgia 12/18/2014  . Narrowing of intervertebral disc space 12/18/2014  . Hypertension 12/18/2014  . Gastro-esophageal reflux disease without esophagitis 12/18/2014  . HLD (hyperlipidemia) 12/18/2014  . Cardiac murmur 12/18/2014  .  Arthritis, degenerative 12/18/2014  . Hypertonicity of bladder 12/18/2014  . Detrusor muscle hypertonia 12/18/2014  . Heart valve disease 12/18/2014  . Asymptomatic varicose veins 12/18/2014   Past Medical History:  Diagnosis Date  . Cataracts, bilateral   . CHF (congestive heart failure) ([place]) 02/2019  . Coronary artery disease   . Environmental allergies   . Family history of adverse reaction to anesthesia    sister-nauseated  . GERD (gastroesophageal reflux disease)   . Heart murmur 2019   had since a child. dr. Nehemiah Massed follows d/t getting louder  . Hemorrhoids   . History of chickenpox   . Hypertension   . Incontinence in female   . Macular degeneration   . Myocardial infarction ([place]) 01/2019   1 stent   . Osteoarthritis   . Skin cancer 12/2017/11-2019   BCC. nose removed from nose last week.  shoulder squamos cell removed previously  . Type O blood, Rh negative 12/2017   patient requests that this be present on her chart       Medications: Outpatient Medications Prior to Visit  Medication Sig  . atorvastatin (LIPITOR) 40 MG tablet Take 1 tablet (40 mg total) by mouth daily at 6 PM.  . Carboxymethylcell-Hypromellose (GENTEAL) 0.25-0.3 % GEL Apply 1 application to eye at bedtime.   . carvedilol (COREG) 12.5 MG tablet Take 12.5 mg by mouth 2 (two) times daily with a meal.   . cetirizine (ZYRTEC ALLERGY) 10 MG tablet Take 10 mg by mouth at bedtime.   . fluticasone (FLONASE) 50 MCG/ACT nasal spray Place 1 spray into both nostrils daily.   . furosemide (LASIX) 20 MG tablet Take  1 tablet (20 mg total) by mouth 2 (two) times daily. (Patient taking differently: Take 20 mg by mouth 2 (two) times daily. )  . metolazone (ZAROXOLYN) 2.5 MG tablet Take 1 tablet (2.5 mg total) by mouth daily.  . Multiple Vitamins-Minerals (PRESERVISION AREDS PO) Take 1 tablet by mouth 2 (two) times daily.   Marland Kitchen omeprazole (PRILOSEC) 20 MG capsule TAKE 1 CAPSULE BY MOUTH ONCE DAILY (Patient taking  differently: Take 20 mg by mouth every morning. )  . potassium chloride (KLOR-CON 10) 10 MEQ tablet Take 1 tablet (10 mEq total) by mouth 2 (two) times daily.  . Probiotic Product (ALIGN) 4 MG CAPS Take 4 mg by mouth daily after lunch.   . sacubitril-valsartan (ENTRESTO) 49-51 MG Take by mouth 2 (two) times daily.   . traMADol (ULTRAM) 50 MG tablet Take 0.5 tablets (25 mg total) by mouth every 6 (six) hours as needed for moderate pain. Take 1/2 tablet by mouth every 4 hours PRN, moderate pain  . acetaminophen (TYLENOL) 650 MG CR tablet Take 1,300 mg by mouth every 8 (eight) hours.   . ondansetron (ZOFRAN-ODT) 4 MG disintegrating tablet Take 1 tablet (4 mg total) by mouth every 8 (eight) hours as needed for nausea or vomiting. (Patient not taking: Reported on 05/15/2020)   No facility-administered medications prior to visit.    Review of Systems  Constitutional: Positive for fatigue.  Respiratory: Positive for shortness of breath (improving).   Cardiovascular: Negative for chest pain, palpitations and leg swelling.  Neurological: Positive for weakness.    Last CBC Lab Results  Component Value Date   WBC 7.2 05/08/2020   HGB 11.4 05/08/2020   HCT 32.7 (L) 05/08/2020   MCV 86 05/08/2020   MCH 29.9 05/08/2020   RDW 18.5 (H) 05/08/2020   PLT 189 06/23/3233   Last metabolic panel Lab Results  Component Value Date   GLUCOSE 108 (H) 05/15/2020   NA 136 05/15/2020   K 3.6 05/15/2020   CL 100 05/15/2020   CO2 22 05/15/2020   BUN 15 05/15/2020   CREATININE 0.72 05/15/2020   GFRNONAA 78 05/15/2020   GFRAA 90 05/15/2020   CALCIUM 8.8 05/15/2020   PHOS 3.8 03/27/2020   PROT 6.5 05/08/2020   ALBUMIN 3.9 05/08/2020   LABGLOB 2.3 02/14/2020   AGRATIO 1.7 02/14/2020   BILITOT 1.1 05/08/2020   ALKPHOS 113 05/08/2020   AST 15 05/08/2020   ALT 14 05/08/2020   ANIONGAP 10 01/06/2020      Objective    BP (!) 142/64 (BP Location: Left Arm, Patient Position: Sitting, Cuff Size: Large)    Pulse 68   Temp 98.7 F (37.1 C) (Oral)   Resp 16   Wt 141 lb 11.2 oz (64.3 kg)   SpO2 98%   BMI 25.92 kg/m  BP Readings from Last 3 Encounters:  05/15/20 (!) 142/64  05/08/20 (!) 148/88  04/29/20 136/78   Wt Readings from Last 3 Encounters:  05/15/20 141 lb 11.2 oz (64.3 kg)  04/29/20 139 lb (63 kg)  03/27/20 140 lb 12.8 oz (63.9 kg)      Physical Exam Vitals reviewed.  Constitutional:      General: She is not in acute distress.    Appearance: Normal appearance. She is well-developed. She is not ill-appearing or diaphoretic.  Cardiovascular:     Rate and Rhythm: Normal rate and regular rhythm.     Heart sounds: Normal heart sounds. No murmur heard.  No friction rub. No gallop.  Pulmonary:     Effort: Pulmonary effort is normal. No respiratory distress.     Breath sounds: Normal breath sounds. No wheezing or rales.  Musculoskeletal:     Cervical back: Normal range of motion and neck supple.  Neurological:     Mental Status: She is alert.      No results found for any visits on 05/15/20.  Assessment & Plan     1. Acute on chronic combined systolic and diastolic heart failure (HCC) Symptoms improving. Increase furosemide to 40mg  BID. Continue all other medications. Will recheck labs to make sure improving. Discussed with Dr. Rosanna Randy and he agrees with plan. F/U in 1-2 weeks.  - Basic Metabolic Panel (BMET) - B Nat Peptide - furosemide (LASIX) 40 MG tablet; Take 1 tablet (40 mg total) by mouth 2 (two) times daily.  Dispense: 60 tablet; Refill: 3   No follow-ups on file.      Reynolds Bowl, PA-C, have reviewed all documentation for this visit. The documentation on 05/21/20 for the exam, diagnosis, procedures, and orders are all accurate and complete.   Rubye Beach  [facility] [phone] (phone) [phone] (fax)  [place]

## 2020-05-15 ENCOUNTER — Other Ambulatory Visit: Payer: Self-pay

## 2020-05-15 ENCOUNTER — Ambulatory Visit (INDEPENDENT_AMBULATORY_CARE_PROVIDER_SITE_OTHER): Payer: Medicare Other | Admitting: Physician Assistant

## 2020-05-15 ENCOUNTER — Encounter: Payer: Self-pay | Admitting: Physician Assistant

## 2020-05-15 VITALS — BP 142/64 | HR 68 | Temp 98.7°F | Resp 16 | Wt 141.7 lb

## 2020-05-15 DIAGNOSIS — I2510 Atherosclerotic heart disease of native coronary artery without angina pectoris: Secondary | ICD-10-CM

## 2020-05-15 DIAGNOSIS — I5043 Acute on chronic combined systolic (congestive) and diastolic (congestive) heart failure: Secondary | ICD-10-CM

## 2020-05-15 MED ORDER — FUROSEMIDE 40 MG PO TABS: 40.0000 mg | ORAL_TABLET | Freq: Two times a day (BID) | ORAL | 3 refills | Status: DC

## 2020-05-15 NOTE — Patient Instructions (Signed)
Heart Failure Exacerbation  Heart failure is a condition in which the heart does not fill up with enough blood, and therefore does not pump enough blood and oxygen to the body. When this happens, parts of the body do not get the blood and oxygen they need to function properly. This can cause symptoms such as breathing problems, fatigue, swelling, and confusion. Heart failure exacerbation refers to heart failure symptoms that get worse. The symptoms may get worse suddenly or develop slowly over time. Heart failure exacerbation is a serious medical problem that should be treated right away. What are the causes? A heart failure exacerbation can be triggered by:  Not taking your heart failure medicines correctly.  Infections.  Eating an unhealthy diet or a diet that is high in salt (sodium).  Drinking too much fluid.  Drinking alcohol.  Taking illegal drugs, such as cocaine or methamphetamine.  Not exercising. Other causes include:  Other heart conditions such as an irregular heartbeat (arrhythmia).  Anemia.  Other medical problems, such as kidney failure. Sometimes the cause of the exacerbation is not known. What are the signs or symptoms? When heart failure symptoms suddenly or slowly get worse, this may be a sign of heart failure exacerbation. Symptoms of heart failure include:  Breathing problems or shortness of breath.  Chronic coughing or wheezing.  Fatigue.  Nausea or lack of appetite.  Feeling light-headed.  Confusion or memory loss.  Increased heart rate or irregular heartbeat.  Buildup of fluid in the legs, ankles, feet, or abdomen.  Difficulty breathing when lying down. How is this diagnosed? This condition is diagnosed based on:  Your symptoms and medical history.  A physical exam. You may also have tests, including:  Electrocardiogram (ECG). This test measures the electrical activity of your heart.  Echocardiogram. This test uses sound waves to take a  picture of your heart to see how well it works.  Blood tests.  Imaging tests, such as: ? Chest X-ray. ? MRI. ? Ultrasound.  Stress test. This test examines how well your heart functions when you exercise. Your heart is monitored while you exercise on a treadmill or exercise bike. If you cannot exercise, medicines may be used to increase your heartbeat in place of exercise.  Cardiac catheterization. During this test, a thin, flexible tube (catheter) is inserted into a blood vessel and threaded up to your heart. This test allows your health care provider to check the arteries that lead to your heart (coronary arteries).  Right heart catheterization. During this test, the pressure in your heart is measured. How is this treated? This condition may be treated by:  Adjusting your heart medicines.  Maintaining a healthy lifestyle. This includes: ? Eating a heart-healthy diet that is low in sodium. ? Not using any products that contain nicotine or tobacco, such as cigarettes and e-cigarettes. ? Regular exercise. ? Monitoring your fluid intake. ? Monitoring your weight and reporting changes to your health care provider.  Treating sleep apnea, if you have this condition.  Surgery. This may include: ? Implanting a device that helps both sides of your heart contract at the same time (cardiac resynchronization therapy device). This can help with heart function and relieve heart failure symptoms. ? Implanting a device that can correct heart rhythm problems (implantable cardioverter defibrillator). ? Connecting a device to your heart to help it pump blood (ventricular assist device). ? Heart transplant. Follow these instructions at home: Medicines  Take over-the-counter and prescription medicines only as told by your  health care provider.  Do not stop taking your medicines or change the amount you take. If you are having problems or side effects from your medicines, talk to your health care  provider.  If you are having difficulty paying for your medicines, contact a social worker or your clinic. There are many programs to assist with medicine costs.  Talk to your health care provider before starting any new medicines or supplements.  Make sure your health care provider and pharmacist have a list of all the medicines you are taking. Eating and drinking   Avoid drinking alcohol.  Eat a heart-healthy diet as told by your health care provider. This includes: ? Plenty of fruits and vegetables. ? Lean proteins. ? Low-fat dairy. ? Whole grains. ? Foods that are low in sodium. Activity   Exercise regularly as told by your health care provider. Balance exercise with rest.  Ask your health care provider what activities are safe for you. This includes sexual activity, exercise, and daily tasks at home or work. Lifestyle  Do not use any products that contain nicotine or tobacco, such as cigarettes and e-cigarettes. If you need help quitting, ask your health care provider.  Maintain a healthy weight. Ask your health care provider what weight is healthy for you.  Consider joining a patient support group. This can help with emotional problems you may have, such as stress and anxiety. General instructions  Talk to your health care provider about flu and pneumonia vaccines.  Keep a list of medicines that you are taking. This may help in emergency situations.  Keep all follow-up visits as told by your health care provider. This is important. Contact a health care provider if:  You have questions about your medicines or you miss a dose.  You feel anxious, depressed, or stressed.  You have swelling in your feet, ankles, legs, or abdomen.  You have shortness of breath during activity or exercise.  You have a cough.  You have a fever.  You have trouble sleeping.  You gain 2-3 lb (1-1.4 kg) in 24 hours or 5 lb (2.3 kg) in a week. Get help right away if:  You have chest  pain.  You have shortness of breath while resting.  You have severe fatigue.  You are confused.  You have severe dizziness.  You have a rapid or irregular heartbeat.  You have nausea or you vomit.  You have a cough that is worse at night or you cannot lie flat.  You have a cough that will not go away.  You have severe depression or sadness. Summary  When heart failure symptoms get worse, it is called heart failure exacerbation.  Common causes of this condition include taking medicines incorrectly, infections, and drinking alcohol.  This condition may be treated by adjusting medicines, maintaining a healthy lifestyle, or surgery.  Do not stop taking your medicines or change the amount you take. If you are having problems or side effects from your medicines, talk to your health care provider. This information is not intended to replace advice given to you by your health care provider. Make sure you discuss any questions you have with your health care provider. Document Revised: 05/14/2017 Document Reviewed: 10/13/2016 Elsevier Patient Education  [place].

## 2020-05-16 ENCOUNTER — Ambulatory Visit
Admission: RE | Admit: 2020-05-16 | Discharge: 2020-05-16 | Disposition: A | Discharge status: Home / Self Care | Payer: Medicare Other | Source: Ambulatory Visit | Attending: Physician Assistant | Admitting: Physician Assistant

## 2020-05-16 DIAGNOSIS — R17 Unspecified jaundice: Secondary | ICD-10-CM

## 2020-05-16 DIAGNOSIS — K7689 Other specified diseases of liver: Secondary | ICD-10-CM | POA: Diagnosis not present

## 2020-05-16 LAB — BASIC METABOLIC PANEL
BUN/Creatinine Ratio: 21 (ref 12–28)
BUN: 15 mg/dL (ref 8–27)
CO2: 22 mmol/L (ref 20–29)
Calcium: 8.8 mg/dL (ref 8.7–10.3)
Chloride: 100 mmol/L (ref 96–106)
Creatinine, Ser: 0.72 mg/dL (ref 0.57–1.00)
GFR calc Af Amer: 90 mL/min/{1.73_m2} (ref >59)
GFR calc non Af Amer: 78 mL/min/{1.73_m2} (ref >59)
Glucose: 108 mg/dL — ABNORMAL HIGH (ref 65–99)
Potassium: 3.6 mmol/L (ref 3.5–5.2)
Sodium: 136 mmol/L (ref 134–144)

## 2020-05-16 LAB — BRAIN NATRIURETIC PEPTIDE: BNP: 4865 pg/mL — ABNORMAL HIGH (ref 0.0–100.0)

## 2020-05-21 ENCOUNTER — Ambulatory Visit: Payer: Medicare Other

## 2020-05-21 DIAGNOSIS — I5042 Chronic combined systolic (congestive) and diastolic (congestive) heart failure: Secondary | ICD-10-CM

## 2020-05-21 NOTE — Chronic Care Management (AMB) (Signed)
Appointment Rescheduled: Please disregard this encounter.

## 2020-05-21 NOTE — Chronic Care Management (AMB) (Signed)
Chronic Care Management   Follow Up Note   05/21/2020 Name: Teresa Moyer MRN: [id number] DOB: May 19, 1936  Primary Care Provider: Jerrol Banana., MD Reason for referral : Chronic Care Management   Teresa Moyer is a 84 y.o. year old female who is a primary care patient of Jerrol Banana., MD. She is currently engaged with the chronic care management team. A routine telephonic outreach was conducted today.  Review of Teresa Moyer's status, including review of consultants reports, relevant labs and test results was conducted today. Collaboration with appropriate care team members was performed as part of the comprehensive evaluation and provision of chronic care management services.    SDOH (Social Determinants of Health) assessments performed: No   Outpatient Encounter Medications as of 05/21/2020  Medication Sig Note  . atorvastatin (LIPITOR) 40 MG tablet Take 1 tablet (40 mg total) by mouth daily at 6 PM.   . Carboxymethylcell-Hypromellose (GENTEAL) 0.25-0.3 % GEL Apply 1 application to eye at bedtime.    . carvedilol (COREG) 12.5 MG tablet Take 12.5 mg by mouth 2 (two) times daily with a meal.    . cetirizine (ZYRTEC ALLERGY) 10 MG tablet Take 10 mg by mouth at bedtime.    . fluticasone (FLONASE) 50 MCG/ACT nasal spray Place 1 spray into both nostrils daily.    . furosemide (LASIX) 40 MG tablet Take 1 tablet (40 mg total) by mouth 2 (two) times daily.   . Multiple Vitamins-Minerals (PRESERVISION AREDS PO) Take 1 tablet by mouth 2 (two) times daily.    Marland Kitchen omeprazole (PRILOSEC) 20 MG capsule TAKE 1 CAPSULE BY MOUTH ONCE DAILY (Patient taking differently: Take 20 mg by mouth every morning. )   . ondansetron (ZOFRAN-ODT) 4 MG disintegrating tablet Take 1 tablet (4 mg total) by mouth every 8 (eight) hours as needed for nausea or vomiting. (Patient not taking: Reported on 05/15/2020)   . potassium chloride (KLOR-CON 10) 10 MEQ tablet Take 1 tablet (10 mEq total) by mouth 2  (two) times daily.   . Probiotic Product (ALIGN) 4 MG CAPS Take 4 mg by mouth daily after lunch.  02/08/2020: Reports currently taking 2 a day  . sacubitril-valsartan (ENTRESTO) 49-51 MG Take by mouth 2 (two) times daily.    . traMADol (ULTRAM) 50 MG tablet Take 0.5 tablets (25 mg total) by mouth every 6 (six) hours as needed for moderate pain. Take 1/2 tablet by mouth every 4 hours PRN, moderate pain    No facility-administered encounter medications on file as of 05/21/2020.      Goals Addressed            This Visit's Progress   . Chronic Disease Management       CARE PLAN ENTRY (see longitudinal plan of care for additional care plan information)  Current Barriers:  . Chronic Disease Management support and education needs related to HTN, CAP, CHF, GERD, and HLD. (Hx of Skin Ca)   Case Manager Clinical Goal(s):  Over the next 90 days, patient will: . Not require hospitalization d/t complications r/t chronic illnesses. Over the next 120 days, patient will: . Continue to take all medications as prescribed. . Continue attending appointments as scheduled. . Monitor blood pressure and record readings. . Monitor weight and record readings. . Adhere to recommended cardiac prudent/heart healthy diet. . Follow recommended safety precautions to prevent falls and injuries.   Interventions:  . Inter-disciplinary care team collaboration (see longitudinal plan of care) . Reviewed medications and recent  provider recommendations. Recently evaluated in the clinic and instructed to take 40mg  of furosemide twice a day. She reports taking as prescribed. Denies weight gain greater than 3 lbs overnight or 5 lbs within the week. Current weight is 136.4 lbs. She has a nonproductive cough and notes decreased activity tolerance over the past few weeks. Denies falls. Denies episodes of chest pain or palpitations. Denies recent episodes of shortness of breath. Reports oxygen saturations in the low 90's. We  discussed worsening s/sx of complications r/t CHF and indications for seeking medical attention.   . Discussed plans for care management follow-up. Reports feeling well today. She will return to the clinic for a follow-up this week. Denies urgent concerns or changes in care management needs. Agreeable to care management follow-up later this month to review plan r/t CHF.    Patient Self Care Activities:  . Self administers medications. . Attends scheduled provider appointments . Calls pharmacy for medication refills . Performs ADL's independently . Performs IADL's independently . Calls provider office for new concerns or questions   Please see past updates related to this goal by clicking on the "Past Updates" button in the selected goal          PLAN A member of the care management team will follow-up with Teresa Moyer later this month.    [facility] Care Management [facility] [phone]

## 2020-05-22 ENCOUNTER — Other Ambulatory Visit: Payer: Self-pay

## 2020-05-22 ENCOUNTER — Ambulatory Visit (INDEPENDENT_AMBULATORY_CARE_PROVIDER_SITE_OTHER): Payer: Medicare Other | Admitting: Physician Assistant

## 2020-05-22 ENCOUNTER — Encounter: Payer: Self-pay | Admitting: Physician Assistant

## 2020-05-22 VITALS — BP 129/76 | HR 71 | Temp 98.3°F | Resp 16 | Wt 139.5 lb

## 2020-05-22 DIAGNOSIS — I5043 Acute on chronic combined systolic (congestive) and diastolic (congestive) heart failure: Secondary | ICD-10-CM

## 2020-05-22 DIAGNOSIS — I2510 Atherosclerotic heart disease of native coronary artery without angina pectoris: Secondary | ICD-10-CM | POA: Diagnosis not present

## 2020-05-22 DIAGNOSIS — R531 Weakness: Secondary | ICD-10-CM

## 2020-05-22 DIAGNOSIS — Z23 Encounter for immunization: Secondary | ICD-10-CM

## 2020-05-22 NOTE — Progress Notes (Signed)
Established patient visit   Patient: Teresa Moyer   DOB: [birth year]   84 y.o. Female  MRN: [id number] Visit Date: 05/22/2020  Today's healthcare provider: Mar Daring, PA-C   Chief Complaint  Patient presents with  . Follow-up   Subjective    HPI  Patient here for a week follow up on acute on chronic combined systolic and diastolic heart failure. Furosemide increased to 40 mg BID. She reports she is feeling much better. Still having weakness and fatigues easily, but has been walking in her house with a cane and sometimes nothing. Most often she is still ambulating with a walker for safety.  She did recently have TKR in the left knee, but has been cleared by Orthopedics.  Wt Readings from Last 3 Encounters:  05/22/20 139 lb 8 oz (63.3 kg)  05/15/20 141 lb 11.2 oz (64.3 kg)  04/29/20 139 lb (63 kg)    Patient Active Problem List   Diagnosis Date Noted  . Acute CHF (congestive heart failure) ([place]) 01/04/2020  . Acute blood loss anemia   . Hyponatremia with excess extracellular fluid volume   . Total knee replacement status 12/20/2019  . Acute on chronic combined systolic and diastolic CHF (congestive heart failure) ([place]) 09/06/2019  . Acute respiratory failure with hypoxia ([place]) 01/30/2019  . Coronary artery disease involving native coronary artery of native heart 01/11/2019  . STEMI (ST elevation myocardial infarction) ([place]) 12/27/2018  . STEMI involving left anterior descending coronary artery ([place]) 12/27/2018  . Acute on chronic cholecystitis 02/16/2018  . Gallstones 12/15/2017  . Actinic keratosis 12/18/2014  . Allergic rhinitis 12/18/2014  . Arthritis 12/18/2014  . Basal cell carcinoma of skin 12/18/2014  . Gonalgia 12/18/2014  . Narrowing of intervertebral disc space 12/18/2014  . Hypertension 12/18/2014  . Gastro-esophageal reflux disease without esophagitis 12/18/2014  . HLD (hyperlipidemia) 12/18/2014  . Cardiac murmur 12/18/2014  . Arthritis,  degenerative 12/18/2014  . Hypertonicity of bladder 12/18/2014  . Detrusor muscle hypertonia 12/18/2014  . Heart valve disease 12/18/2014  . Asymptomatic varicose veins 12/18/2014   Past Medical History:  Diagnosis Date  . Cataracts, bilateral   . CHF (congestive heart failure) ([place]) 02/2019  . Coronary artery disease   . Environmental allergies   . Family history of adverse reaction to anesthesia    sister-nauseated  . GERD (gastroesophageal reflux disease)   . Heart murmur 2019   had since a child. dr. Nehemiah Massed follows d/t getting louder  . Hemorrhoids   . History of chickenpox   . Hypertension   . Incontinence in female   . Macular degeneration   . Myocardial infarction ([employer]) 01/2019   1 stent   . Osteoarthritis   . Skin cancer 12/2017/11-2019   BCC. nose removed from nose last week.  shoulder squamos cell removed previously  . Type O blood, Rh negative 12/2017   patient requests that this be present on her chart       Medications: Outpatient Medications Prior to Visit  Medication Sig  . atorvastatin (LIPITOR) 40 MG tablet Take 1 tablet (40 mg total) by mouth daily at 6 PM.  . Carboxymethylcell-Hypromellose (GENTEAL) 0.25-0.3 % GEL Apply 1 application to eye at bedtime.   . carvedilol (COREG) 12.5 MG tablet Take 12.5 mg by mouth 2 (two) times daily with a meal.   . cetirizine (ZYRTEC ALLERGY) 10 MG tablet Take 10 mg by mouth at bedtime.   . fluticasone (FLONASE) 50 MCG/ACT nasal spray Place 1  spray into both nostrils daily.   . furosemide (LASIX) 40 MG tablet Take 1 tablet (40 mg total) by mouth 2 (two) times daily.  . Multiple Vitamins-Minerals (PRESERVISION AREDS PO) Take 1 tablet by mouth 2 (two) times daily.   Marland Kitchen omeprazole (PRILOSEC) 20 MG capsule TAKE 1 CAPSULE BY MOUTH ONCE DAILY (Patient taking differently: Take 20 mg by mouth every morning. )  . potassium chloride (KLOR-CON 10) 10 MEQ tablet Take 1 tablet (10 mEq total) by mouth 2 (two) times daily.  . Probiotic  Product (ALIGN) 4 MG CAPS Take 4 mg by mouth daily after lunch.   . sacubitril-valsartan (ENTRESTO) 49-51 MG Take by mouth 2 (two) times daily.   . traMADol (ULTRAM) 50 MG tablet Take 0.5 tablets (25 mg total) by mouth every 6 (six) hours as needed for moderate pain. Take 1/2 tablet by mouth every 4 hours PRN, moderate pain  . ondansetron (ZOFRAN-ODT) 4 MG disintegrating tablet Take 1 tablet (4 mg total) by mouth every 8 (eight) hours as needed for nausea or vomiting. (Patient not taking: Reported on 05/15/2020)   No facility-administered medications prior to visit.    Review of Systems  Constitutional: Negative.   Respiratory: Positive for cough and shortness of breath.   Cardiovascular: Negative.  Negative for leg swelling.  Neurological: Positive for weakness.    Last CBC Lab Results  Component Value Date   WBC 7.2 05/08/2020   HGB 11.4 05/08/2020   HCT 32.7 (L) 05/08/2020   MCV 86 05/08/2020   MCH 29.9 05/08/2020   RDW 18.5 (H) 05/08/2020   PLT 189 19/75/8832   Last metabolic panel Lab Results  Component Value Date   GLUCOSE 108 (H) 05/15/2020   NA 136 05/15/2020   K 3.6 05/15/2020   CL 100 05/15/2020   CO2 22 05/15/2020   BUN 15 05/15/2020   CREATININE 0.72 05/15/2020   GFRNONAA 78 05/15/2020   GFRAA 90 05/15/2020   CALCIUM 8.8 05/15/2020   PHOS 3.8 03/27/2020   PROT 6.5 05/08/2020   ALBUMIN 3.9 05/08/2020   LABGLOB 2.3 02/14/2020   AGRATIO 1.7 02/14/2020   BILITOT 1.1 05/08/2020   ALKPHOS 113 05/08/2020   AST 15 05/08/2020   ALT 14 05/08/2020   ANIONGAP 10 01/06/2020      Objective    BP 129/76 (BP Location: Left Arm, Patient Position: Sitting, Cuff Size: Normal)   Pulse 71   Temp 98.3 F (36.8 C) (Oral)   Resp 16   Wt 139 lb 8 oz (63.3 kg)   BMI 25.51 kg/m  BP Readings from Last 3 Encounters:  05/22/20 129/76  05/15/20 (!) 142/64  05/08/20 (!) 148/88   Wt Readings from Last 3 Encounters:  05/22/20 139 lb 8 oz (63.3 kg)  05/15/20 141 lb 11.2 oz  (64.3 kg)  04/29/20 139 lb (63 kg)      Physical Exam Vitals reviewed.  Constitutional:      General: She is not in acute distress.    Appearance: Normal appearance. She is well-developed. She is not ill-appearing or diaphoretic.  Neck:     Vascular: No JVD.     Trachea: No tracheal deviation.  Cardiovascular:     Rate and Rhythm: Normal rate and regular rhythm.     Heart sounds: Murmur heard.  No friction rub. No gallop.   Pulmonary:     Effort: Pulmonary effort is normal. No respiratory distress.     Breath sounds: Normal breath sounds. No wheezing or rales.  Musculoskeletal:     Cervical back: Normal range of motion and neck supple.     Right lower leg: No edema.     Left lower leg: No edema.  Neurological:     Mental Status: She is alert.       No results found for any visits on 05/22/20.  Assessment & Plan     1. Acute on chronic combined systolic and diastolic CHF (congestive heart failure) (HCC) Improved and euvolemic today. Continue Furosemide 40mg  BID. Continue Entresto as prescribed by Cardiology. F/U in 4 weeks.  - Ambulatory referral to Home Health  2. Need for influenza vaccination Flu vaccine given today without complication. Patient sat upright for 15 minutes to check for adverse reaction before being released. - Flu Vaccine QUAD High Dose(Fluad)  3. Weakness HH PT ordered as below.  - Ambulatory referral to [place]   Return in about 6 weeks (around 07/01/2020) for f/u heart failure and get labs.      Reynolds Bowl, PA-C, have reviewed all documentation for this visit. The documentation on 05/22/20 for the exam, diagnosis, procedures, and orders are all accurate and complete.   Rubye Beach  [facility] [phone] (phone) [phone] (fax)  [place]

## 2020-05-25 DIAGNOSIS — N3281 Overactive bladder: Secondary | ICD-10-CM | POA: Diagnosis not present

## 2020-05-25 DIAGNOSIS — Z7951 Long term (current) use of inhaled steroids: Secondary | ICD-10-CM | POA: Diagnosis not present

## 2020-05-25 DIAGNOSIS — K219 Gastro-esophageal reflux disease without esophagitis: Secondary | ICD-10-CM | POA: Diagnosis not present

## 2020-05-25 DIAGNOSIS — Z7902 Long term (current) use of antithrombotics/antiplatelets: Secondary | ICD-10-CM | POA: Diagnosis not present

## 2020-05-25 DIAGNOSIS — Z96652 Presence of left artificial knee joint: Secondary | ICD-10-CM | POA: Diagnosis not present

## 2020-05-25 DIAGNOSIS — I8390 Asymptomatic varicose veins of unspecified lower extremity: Secondary | ICD-10-CM | POA: Diagnosis not present

## 2020-05-25 DIAGNOSIS — I5042 Chronic combined systolic (congestive) and diastolic (congestive) heart failure: Secondary | ICD-10-CM | POA: Diagnosis not present

## 2020-05-25 DIAGNOSIS — I110 Hypertensive heart disease with heart failure: Secondary | ICD-10-CM | POA: Diagnosis not present

## 2020-05-25 DIAGNOSIS — I252 Old myocardial infarction: Secondary | ICD-10-CM | POA: Diagnosis not present

## 2020-05-25 DIAGNOSIS — H3530 Unspecified macular degeneration: Secondary | ICD-10-CM | POA: Diagnosis not present

## 2020-05-25 DIAGNOSIS — I2510 Atherosclerotic heart disease of native coronary artery without angina pectoris: Secondary | ICD-10-CM | POA: Diagnosis not present

## 2020-05-25 DIAGNOSIS — R32 Unspecified urinary incontinence: Secondary | ICD-10-CM | POA: Diagnosis not present

## 2020-05-25 DIAGNOSIS — Z9181 History of falling: Secondary | ICD-10-CM | POA: Diagnosis not present

## 2020-05-25 DIAGNOSIS — Z85828 Personal history of other malignant neoplasm of skin: Secondary | ICD-10-CM | POA: Diagnosis not present

## 2020-05-25 DIAGNOSIS — E785 Hyperlipidemia, unspecified: Secondary | ICD-10-CM | POA: Diagnosis not present

## 2020-05-25 DIAGNOSIS — K8020 Calculus of gallbladder without cholecystitis without obstruction: Secondary | ICD-10-CM | POA: Diagnosis not present

## 2020-05-25 DIAGNOSIS — Z79891 Long term (current) use of opiate analgesic: Secondary | ICD-10-CM | POA: Diagnosis not present

## 2020-05-25 DIAGNOSIS — L570 Actinic keratosis: Secondary | ICD-10-CM | POA: Diagnosis not present

## 2020-05-25 DIAGNOSIS — M1990 Unspecified osteoarthritis, unspecified site: Secondary | ICD-10-CM | POA: Diagnosis not present

## 2020-05-25 DIAGNOSIS — J309 Allergic rhinitis, unspecified: Secondary | ICD-10-CM | POA: Diagnosis not present

## 2020-05-27 NOTE — Patient Instructions (Addendum)
Thank you for allowing the Chronic Care Management team to participate in your care.  Goals Addressed            This Visit's Progress    Chronic Disease Management       CARE PLAN ENTRY (see longitudinal plan of care for additional care plan information)  Current Barriers:   Chronic Disease Management support and education needs related to HTN, CAP, CHF, GERD, and HLD. (Hx of Skin Ca)   Case Manager Clinical Goal(s):  Over the next 90 days, patient will:  Not require hospitalization d/t complications r/t chronic illnesses. Over the next 120 days, patient will:  Continue to take all medications as prescribed.  Continue attending appointments as scheduled.  Monitor blood pressure and record readings.  Monitor weight and record readings.  Adhere to recommended cardiac prudent/heart healthy diet.  Follow recommended safety precautions to prevent falls and injuries.   Interventions:   Inter-disciplinary care team collaboration (see longitudinal plan of care)  Reviewed medications and recent provider recommendations. Recently evaluated in the clinic and instructed to take 40mg  of furosemide twice a day. She reports taking as prescribed. Denies weight gain greater than 3 lbs overnight or 5 lbs within the week. Current weight is 136.4 lbs. She has a nonproductive cough and notes decreased activity tolerance over the past few weeks. Denies falls. Denies episodes of chest pain or palpitations. Denies recent episodes of shortness of breath. Reports oxygen saturations in the low 90's. We discussed worsening s/sx of complications r/t CHF and indications for seeking medical attention.    Discussed plans for care management follow-up. Reports feeling well today. She will return to the clinic for a follow-up this week. Denies urgent concerns or changes in care management needs. Agreeable to care management follow-up later this month to review plan r/t CHF.    Patient Self Care  Activities:   Self administers medications.  Attends scheduled provider appointments  Calls pharmacy for medication refills  Performs ADL's independently  Performs IADL's independently  Calls provider office for new concerns or questions   Please see past updates related to this goal by clicking on the "Past Updates" button in the selected goal         Mrs. Carrillo verbalized understanding of the information discussed during the telephonic outreach today. Declined need for a mailed/printed copy of the information.    A member of the care management team will follow-up with Ms. Lightsey later this month.    [facility] Care Management [facility] [phone]

## 2020-05-30 DIAGNOSIS — H353231 Exudative age-related macular degeneration, bilateral, with active choroidal neovascularization: Secondary | ICD-10-CM | POA: Diagnosis not present

## 2020-05-31 ENCOUNTER — Other Ambulatory Visit: Payer: Self-pay

## 2020-05-31 ENCOUNTER — Ambulatory Visit
Admission: RE | Admit: 2020-05-31 | Discharge: 2020-05-31 | Disposition: A | Discharge status: Home / Self Care | Payer: Medicare Other | Source: Ambulatory Visit | Attending: Family Medicine | Admitting: Family Medicine

## 2020-05-31 DIAGNOSIS — I110 Hypertensive heart disease with heart failure: Secondary | ICD-10-CM | POA: Diagnosis not present

## 2020-05-31 DIAGNOSIS — K219 Gastro-esophageal reflux disease without esophagitis: Secondary | ICD-10-CM | POA: Diagnosis not present

## 2020-05-31 DIAGNOSIS — Z1231 Encounter for screening mammogram for malignant neoplasm of breast: Secondary | ICD-10-CM | POA: Diagnosis not present

## 2020-05-31 DIAGNOSIS — I2510 Atherosclerotic heart disease of native coronary artery without angina pectoris: Secondary | ICD-10-CM | POA: Diagnosis not present

## 2020-05-31 DIAGNOSIS — M1990 Unspecified osteoarthritis, unspecified site: Secondary | ICD-10-CM | POA: Diagnosis not present

## 2020-05-31 DIAGNOSIS — I8390 Asymptomatic varicose veins of unspecified lower extremity: Secondary | ICD-10-CM | POA: Diagnosis not present

## 2020-05-31 DIAGNOSIS — I5042 Chronic combined systolic (congestive) and diastolic (congestive) heart failure: Secondary | ICD-10-CM | POA: Diagnosis not present

## 2020-06-01 DIAGNOSIS — I8390 Asymptomatic varicose veins of unspecified lower extremity: Secondary | ICD-10-CM | POA: Diagnosis not present

## 2020-06-01 DIAGNOSIS — I110 Hypertensive heart disease with heart failure: Secondary | ICD-10-CM | POA: Diagnosis not present

## 2020-06-01 DIAGNOSIS — K219 Gastro-esophageal reflux disease without esophagitis: Secondary | ICD-10-CM | POA: Diagnosis not present

## 2020-06-01 DIAGNOSIS — M1990 Unspecified osteoarthritis, unspecified site: Secondary | ICD-10-CM | POA: Diagnosis not present

## 2020-06-01 DIAGNOSIS — I2510 Atherosclerotic heart disease of native coronary artery without angina pectoris: Secondary | ICD-10-CM | POA: Diagnosis not present

## 2020-06-01 DIAGNOSIS — I5042 Chronic combined systolic (congestive) and diastolic (congestive) heart failure: Secondary | ICD-10-CM | POA: Diagnosis not present

## 2020-06-04 DIAGNOSIS — I8390 Asymptomatic varicose veins of unspecified lower extremity: Secondary | ICD-10-CM | POA: Diagnosis not present

## 2020-06-04 DIAGNOSIS — I110 Hypertensive heart disease with heart failure: Secondary | ICD-10-CM | POA: Diagnosis not present

## 2020-06-04 DIAGNOSIS — M1990 Unspecified osteoarthritis, unspecified site: Secondary | ICD-10-CM | POA: Diagnosis not present

## 2020-06-04 DIAGNOSIS — I2510 Atherosclerotic heart disease of native coronary artery without angina pectoris: Secondary | ICD-10-CM | POA: Diagnosis not present

## 2020-06-04 DIAGNOSIS — I5042 Chronic combined systolic (congestive) and diastolic (congestive) heart failure: Secondary | ICD-10-CM | POA: Diagnosis not present

## 2020-06-04 DIAGNOSIS — K219 Gastro-esophageal reflux disease without esophagitis: Secondary | ICD-10-CM | POA: Diagnosis not present

## 2020-06-06 ENCOUNTER — Ambulatory Visit: Payer: Medicare Other

## 2020-06-06 DIAGNOSIS — I509 Heart failure, unspecified: Secondary | ICD-10-CM

## 2020-06-11 DIAGNOSIS — I8390 Asymptomatic varicose veins of unspecified lower extremity: Secondary | ICD-10-CM | POA: Diagnosis not present

## 2020-06-11 DIAGNOSIS — I110 Hypertensive heart disease with heart failure: Secondary | ICD-10-CM | POA: Diagnosis not present

## 2020-06-11 DIAGNOSIS — K219 Gastro-esophageal reflux disease without esophagitis: Secondary | ICD-10-CM | POA: Diagnosis not present

## 2020-06-11 DIAGNOSIS — I2510 Atherosclerotic heart disease of native coronary artery without angina pectoris: Secondary | ICD-10-CM | POA: Diagnosis not present

## 2020-06-11 DIAGNOSIS — M1990 Unspecified osteoarthritis, unspecified site: Secondary | ICD-10-CM | POA: Diagnosis not present

## 2020-06-11 DIAGNOSIS — I5042 Chronic combined systolic (congestive) and diastolic (congestive) heart failure: Secondary | ICD-10-CM | POA: Diagnosis not present

## 2020-06-12 DIAGNOSIS — I2510 Atherosclerotic heart disease of native coronary artery without angina pectoris: Secondary | ICD-10-CM | POA: Diagnosis not present

## 2020-06-12 DIAGNOSIS — I5042 Chronic combined systolic (congestive) and diastolic (congestive) heart failure: Secondary | ICD-10-CM | POA: Diagnosis not present

## 2020-06-12 DIAGNOSIS — I110 Hypertensive heart disease with heart failure: Secondary | ICD-10-CM | POA: Diagnosis not present

## 2020-06-12 DIAGNOSIS — K219 Gastro-esophageal reflux disease without esophagitis: Secondary | ICD-10-CM | POA: Diagnosis not present

## 2020-06-12 DIAGNOSIS — I8390 Asymptomatic varicose veins of unspecified lower extremity: Secondary | ICD-10-CM | POA: Diagnosis not present

## 2020-06-12 DIAGNOSIS — M1990 Unspecified osteoarthritis, unspecified site: Secondary | ICD-10-CM | POA: Diagnosis not present

## 2020-06-13 DIAGNOSIS — K219 Gastro-esophageal reflux disease without esophagitis: Secondary | ICD-10-CM | POA: Diagnosis not present

## 2020-06-13 DIAGNOSIS — I8390 Asymptomatic varicose veins of unspecified lower extremity: Secondary | ICD-10-CM | POA: Diagnosis not present

## 2020-06-13 DIAGNOSIS — M1990 Unspecified osteoarthritis, unspecified site: Secondary | ICD-10-CM | POA: Diagnosis not present

## 2020-06-13 DIAGNOSIS — I5042 Chronic combined systolic (congestive) and diastolic (congestive) heart failure: Secondary | ICD-10-CM | POA: Diagnosis not present

## 2020-06-13 DIAGNOSIS — I110 Hypertensive heart disease with heart failure: Secondary | ICD-10-CM | POA: Diagnosis not present

## 2020-06-13 DIAGNOSIS — I2510 Atherosclerotic heart disease of native coronary artery without angina pectoris: Secondary | ICD-10-CM | POA: Diagnosis not present

## 2020-06-18 DIAGNOSIS — R0600 Dyspnea, unspecified: Secondary | ICD-10-CM | POA: Diagnosis not present

## 2020-06-18 DIAGNOSIS — I5022 Chronic systolic (congestive) heart failure: Secondary | ICD-10-CM | POA: Diagnosis not present

## 2020-06-18 NOTE — Patient Instructions (Signed)
  Thank you for allowing the Chronic Care Management team to participate in your care.   Goals Addressed            This Visit's Progress   . Chronic Disease Management       CARE PLAN ENTRY (see longitudinal plan of care for additional care plan information)  Current Barriers:  . Chronic Disease Management support and education needs related to HTN, CAP, CHF, GERD, and HLD. (Hx of Skin Ca)   Case Manager Clinical Goal(s):  Over the next 90 days, patient will: . Not require hospitalization d/t complications r/t chronic illnesses. Over the next 120 days, patient will: . Continue to take all medications as prescribed. . Continue attending appointments as scheduled. . Monitor blood pressure and record readings. . Monitor weight and record readings. . Adhere to recommended cardiac prudent/heart healthy diet. . Follow recommended safety precautions to prevent falls and injuries.   Interventions:  . Inter-disciplinary care team collaboration (see longitudinal plan of care)  . Discussed plans for care management follow-up. Reports feeling well today. Reports some improvement with activity tolerance. Still has an occasional non productive cough. Denies shortness of breath at rest. Denies recent episodes of chest pain or palpitations. Denies increased edema to her lower extremities. Reports weights have remained stable. Weight today was 132.6 lbs. She confirmed start of physical therapy services with [facility]. Using her rollator walker as advised to prevent accidental falls. Reports restricting activities and avoiding exertion as advised.    Patient Self Care Activities:  . Self administers medications. . Attends scheduled provider appointments . Calls pharmacy for medication refills . Performs ADL's independently . Performs IADL's independently . Calls provider office for new concerns or questions   Please see past updates related to this goal by clicking on the "Past  Updates" button in the selected goal      .           .           .             Mrs. Rathbun verbalized understanding of the information discussed during the telephonic outreach today. Declined need for a  mailed/printed copy of the information.  A member of the care management team will follow up with Mrs. Rhee next month.    [facility] Care Management [facility] [phone]

## 2020-06-18 NOTE — Chronic Care Management (AMB) (Signed)
Chronic Care Management   Follow Up Note    Name: NEREA BETTINGER MRN: [id number] DOB: 06/06/1936  Primary Care Provider: Jerrol Banana., MD Reason for referral : Chronic Care Management  TYLASIA COHAN is a 85 y.o. year old female who is a primary care patient of Jerrol Banana., MD. She is currently engaged with the chronic care management team. A routine telephonic outreach was conducted today.  Review of Mrs. Janis status, including review of consultants reports, relevant labs and test results was conducted today. Collaboration with appropriate care team members was performed as part of the comprehensive evaluation and provision of chronic care management services.    SDOH (Social Determinants of Health) assessments performed: No     Outpatient Encounter Medications as of 06/06/2020  Medication Sig Note  . atorvastatin (LIPITOR) 40 MG tablet Take 1 tablet (40 mg total) by mouth daily at 6 PM.   . Carboxymethylcell-Hypromellose (GENTEAL) 0.25-0.3 % GEL Apply 1 application to eye at bedtime.    . carvedilol (COREG) 12.5 MG tablet Take 12.5 mg by mouth 2 (two) times daily with a meal.    . cetirizine (ZYRTEC ALLERGY) 10 MG tablet Take 10 mg by mouth at bedtime.    . fluticasone (FLONASE) 50 MCG/ACT nasal spray Place 1 spray into both nostrils daily.    . furosemide (LASIX) 40 MG tablet Take 1 tablet (40 mg total) by mouth 2 (two) times daily.   . Multiple Vitamins-Minerals (PRESERVISION AREDS PO) Take 1 tablet by mouth 2 (two) times daily.    Marland Kitchen omeprazole (PRILOSEC) 20 MG capsule TAKE 1 CAPSULE BY MOUTH ONCE DAILY (Patient taking differently: Take 20 mg by mouth every morning. )   . ondansetron (ZOFRAN-ODT) 4 MG disintegrating tablet Take 1 tablet (4 mg total) by mouth every 8 (eight) hours as needed for nausea or vomiting. (Patient not taking: Reported on 05/15/2020)   . potassium chloride (KLOR-CON 10) 10 MEQ tablet Take 1 tablet (10 mEq total) by mouth 2 (two) times  daily.   . Probiotic Product (ALIGN) 4 MG CAPS Take 4 mg by mouth daily after lunch.  02/08/2020: Reports currently taking 2 a day  . sacubitril-valsartan (ENTRESTO) 49-51 MG Take by mouth 2 (two) times daily.    . traMADol (ULTRAM) 50 MG tablet Take 0.5 tablets (25 mg total) by mouth every 6 (six) hours as needed for moderate pain. Take 1/2 tablet by mouth every 4 hours PRN, moderate pain    No facility-administered encounter medications on file as of 06/06/2020.      Goals Addressed            This Visit's Progress   . Chronic Disease Management       CARE PLAN ENTRY (see longitudinal plan of care for additional care plan information)  Current Barriers:  . Chronic Disease Management support and education needs related to HTN, CAP, CHF, GERD, and HLD. (Hx of Skin Ca)   Case Manager Clinical Goal(s):  Over the next 90 days, patient will: . Not require hospitalization d/t complications r/t chronic illnesses. Over the next 120 days, patient will: . Continue to take all medications as prescribed. . Continue attending appointments as scheduled. . Monitor blood pressure and record readings. . Monitor weight and record readings. . Adhere to recommended cardiac prudent/heart healthy diet. . Follow recommended safety precautions to prevent falls and injuries.   Interventions:  . Inter-disciplinary care team collaboration (see longitudinal plan of care)  . Discussed plans  for care management follow-up. Reports feeling well today. Reports some improvement with activity tolerance. Still has an occasional non productive cough. Denies shortness of breath at rest. Denies recent episodes of chest pain or palpitations. Denies increased edema to her lower extremities. Reports weights have remained stable. Weight today was 132.6 lbs. She confirmed start of physical therapy services with [facility]. Using her rollator walker as advised to prevent accidental falls. Reports restricting  activities and avoiding exertion as advised.    Patient Self Care Activities:  . Self administers medications. . Attends scheduled provider appointments . Calls pharmacy for medication refills . Performs ADL's independently . Performs IADL's independently . Calls provider office for new concerns or questions   Please see past updates related to this goal by clicking on the "Past Updates" button in the selected goal      .           .           .              PLAN A member of the care management team will follow up with Mrs. Satterwhite next month.    [facility] Care Management [facility] [phone]

## 2020-06-19 ENCOUNTER — Other Ambulatory Visit: Payer: Self-pay | Admitting: Family Medicine

## 2020-06-19 DIAGNOSIS — K219 Gastro-esophageal reflux disease without esophagitis: Secondary | ICD-10-CM | POA: Diagnosis not present

## 2020-06-19 DIAGNOSIS — I8390 Asymptomatic varicose veins of unspecified lower extremity: Secondary | ICD-10-CM | POA: Diagnosis not present

## 2020-06-19 DIAGNOSIS — I110 Hypertensive heart disease with heart failure: Secondary | ICD-10-CM | POA: Diagnosis not present

## 2020-06-19 DIAGNOSIS — M1990 Unspecified osteoarthritis, unspecified site: Secondary | ICD-10-CM | POA: Diagnosis not present

## 2020-06-19 DIAGNOSIS — I5042 Chronic combined systolic (congestive) and diastolic (congestive) heart failure: Secondary | ICD-10-CM | POA: Diagnosis not present

## 2020-06-19 DIAGNOSIS — I2510 Atherosclerotic heart disease of native coronary artery without angina pectoris: Secondary | ICD-10-CM | POA: Diagnosis not present

## 2020-06-21 DIAGNOSIS — K219 Gastro-esophageal reflux disease without esophagitis: Secondary | ICD-10-CM | POA: Diagnosis not present

## 2020-06-21 DIAGNOSIS — M1990 Unspecified osteoarthritis, unspecified site: Secondary | ICD-10-CM | POA: Diagnosis not present

## 2020-06-21 DIAGNOSIS — I5042 Chronic combined systolic (congestive) and diastolic (congestive) heart failure: Secondary | ICD-10-CM | POA: Diagnosis not present

## 2020-06-21 DIAGNOSIS — I8390 Asymptomatic varicose veins of unspecified lower extremity: Secondary | ICD-10-CM | POA: Diagnosis not present

## 2020-06-21 DIAGNOSIS — I110 Hypertensive heart disease with heart failure: Secondary | ICD-10-CM | POA: Diagnosis not present

## 2020-06-21 DIAGNOSIS — I2510 Atherosclerotic heart disease of native coronary artery without angina pectoris: Secondary | ICD-10-CM | POA: Diagnosis not present

## 2020-06-24 DIAGNOSIS — H3530 Unspecified macular degeneration: Secondary | ICD-10-CM | POA: Diagnosis not present

## 2020-06-24 DIAGNOSIS — Z7951 Long term (current) use of inhaled steroids: Secondary | ICD-10-CM | POA: Diagnosis not present

## 2020-06-24 DIAGNOSIS — Z9181 History of falling: Secondary | ICD-10-CM | POA: Diagnosis not present

## 2020-06-24 DIAGNOSIS — Z79891 Long term (current) use of opiate analgesic: Secondary | ICD-10-CM | POA: Diagnosis not present

## 2020-06-24 DIAGNOSIS — I252 Old myocardial infarction: Secondary | ICD-10-CM | POA: Diagnosis not present

## 2020-06-24 DIAGNOSIS — R32 Unspecified urinary incontinence: Secondary | ICD-10-CM | POA: Diagnosis not present

## 2020-06-24 DIAGNOSIS — E785 Hyperlipidemia, unspecified: Secondary | ICD-10-CM | POA: Diagnosis not present

## 2020-06-24 DIAGNOSIS — I8390 Asymptomatic varicose veins of unspecified lower extremity: Secondary | ICD-10-CM | POA: Diagnosis not present

## 2020-06-24 DIAGNOSIS — Z7902 Long term (current) use of antithrombotics/antiplatelets: Secondary | ICD-10-CM | POA: Diagnosis not present

## 2020-06-24 DIAGNOSIS — I2510 Atherosclerotic heart disease of native coronary artery without angina pectoris: Secondary | ICD-10-CM | POA: Diagnosis not present

## 2020-06-24 DIAGNOSIS — J309 Allergic rhinitis, unspecified: Secondary | ICD-10-CM | POA: Diagnosis not present

## 2020-06-24 DIAGNOSIS — Z96652 Presence of left artificial knee joint: Secondary | ICD-10-CM | POA: Diagnosis not present

## 2020-06-24 DIAGNOSIS — I5042 Chronic combined systolic (congestive) and diastolic (congestive) heart failure: Secondary | ICD-10-CM | POA: Diagnosis not present

## 2020-06-24 DIAGNOSIS — M1990 Unspecified osteoarthritis, unspecified site: Secondary | ICD-10-CM | POA: Diagnosis not present

## 2020-06-24 DIAGNOSIS — K219 Gastro-esophageal reflux disease without esophagitis: Secondary | ICD-10-CM | POA: Diagnosis not present

## 2020-06-24 DIAGNOSIS — I110 Hypertensive heart disease with heart failure: Secondary | ICD-10-CM | POA: Diagnosis not present

## 2020-06-24 DIAGNOSIS — N3281 Overactive bladder: Secondary | ICD-10-CM | POA: Diagnosis not present

## 2020-06-24 DIAGNOSIS — Z85828 Personal history of other malignant neoplasm of skin: Secondary | ICD-10-CM | POA: Diagnosis not present

## 2020-06-24 DIAGNOSIS — L570 Actinic keratosis: Secondary | ICD-10-CM | POA: Diagnosis not present

## 2020-06-24 DIAGNOSIS — K8020 Calculus of gallbladder without cholecystitis without obstruction: Secondary | ICD-10-CM | POA: Diagnosis not present

## 2020-06-26 DIAGNOSIS — I5042 Chronic combined systolic (congestive) and diastolic (congestive) heart failure: Secondary | ICD-10-CM | POA: Diagnosis not present

## 2020-06-26 DIAGNOSIS — I2510 Atherosclerotic heart disease of native coronary artery without angina pectoris: Secondary | ICD-10-CM | POA: Diagnosis not present

## 2020-06-26 DIAGNOSIS — I8390 Asymptomatic varicose veins of unspecified lower extremity: Secondary | ICD-10-CM | POA: Diagnosis not present

## 2020-06-26 DIAGNOSIS — K219 Gastro-esophageal reflux disease without esophagitis: Secondary | ICD-10-CM | POA: Diagnosis not present

## 2020-06-26 DIAGNOSIS — I110 Hypertensive heart disease with heart failure: Secondary | ICD-10-CM | POA: Diagnosis not present

## 2020-06-26 DIAGNOSIS — M1990 Unspecified osteoarthritis, unspecified site: Secondary | ICD-10-CM | POA: Diagnosis not present

## 2020-06-27 DIAGNOSIS — R011 Cardiac murmur, unspecified: Secondary | ICD-10-CM | POA: Diagnosis not present

## 2020-06-27 DIAGNOSIS — E782 Mixed hyperlipidemia: Secondary | ICD-10-CM | POA: Diagnosis not present

## 2020-06-27 DIAGNOSIS — I10 Essential (primary) hypertension: Secondary | ICD-10-CM | POA: Diagnosis not present

## 2020-06-27 DIAGNOSIS — I2102 ST elevation (STEMI) myocardial infarction involving left anterior descending coronary artery: Secondary | ICD-10-CM | POA: Diagnosis not present

## 2020-06-27 DIAGNOSIS — I350 Nonrheumatic aortic (valve) stenosis: Secondary | ICD-10-CM | POA: Diagnosis not present

## 2020-06-27 DIAGNOSIS — I38 Endocarditis, valve unspecified: Secondary | ICD-10-CM | POA: Diagnosis not present

## 2020-06-27 DIAGNOSIS — I2510 Atherosclerotic heart disease of native coronary artery without angina pectoris: Secondary | ICD-10-CM | POA: Diagnosis not present

## 2020-06-27 DIAGNOSIS — I5022 Chronic systolic (congestive) heart failure: Secondary | ICD-10-CM | POA: Diagnosis not present

## 2020-06-27 DIAGNOSIS — I340 Nonrheumatic mitral (valve) insufficiency: Secondary | ICD-10-CM | POA: Diagnosis not present

## 2020-07-01 ENCOUNTER — Ambulatory Visit: Payer: Medicare Other | Admitting: Family Medicine

## 2020-07-04 DIAGNOSIS — H353231 Exudative age-related macular degeneration, bilateral, with active choroidal neovascularization: Secondary | ICD-10-CM | POA: Diagnosis not present

## 2020-07-05 NOTE — Progress Notes (Signed)
I,Fidelis Loth,acting as a scribe for [name], MD.,have documented all relevant documentation on the behalf of [name], MD,as directed by  [name], MD while in the presence of [name], MD.   Established patient visit   Patient: Teresa Moyer   DOB: 1936/05/23   85 y.o. Female  MRN: [id number] Visit Date: 07/08/2020  Today's healthcare provider: [name], MD   Chief Complaint  Patient presents with  . Follow-up  . Congestive Heart Failure   Subjective    HPI  Patient is slowly feeling better.  She has no complaints today.  Her husband is in difficult health issues now. Acute on chronic combined systolic and diastolic CHF (congestive heart failure) ([place]) From 05/22/2020-seen by Grace Bushy. Improved and euvolemic today. Continue Furosemide 40mg  BID. Continue Entresto as prescribed by Cardiology. F/U in 4 weeks.   Weakness From 05/22/2020-HH PT ordered as below. Referred to Home Health      Medications: Outpatient Medications Prior to Visit  Medication Sig  . atorvastatin (LIPITOR) 40 MG tablet Take 1 tablet (40 mg total) by mouth daily at 6 PM.  . Carboxymethylcell-Hypromellose (GENTEAL) 0.25-0.3 % GEL Apply 1 application to eye at bedtime.   . carvedilol (COREG) 12.5 MG tablet Take 12.5 mg by mouth 2 (two) times daily with a meal.   . cetirizine (ZYRTEC) 10 MG tablet Take 10 mg by mouth at bedtime.   . fluticasone (FLONASE) 50 MCG/ACT nasal spray Place 1 spray into both nostrils daily.   . furosemide (LASIX) 40 MG tablet Take 1 tablet (40 mg total) by mouth 2 (two) times daily.  . Multiple Vitamins-Minerals (PRESERVISION AREDS PO) Take 1 tablet by mouth 2 (two) times daily.   Marland Kitchen omeprazole (PRILOSEC) 20 MG capsule TAKE 1 CAPSULE BY MOUTH ONCE DAILY (Patient taking differently: Take 20 mg by mouth every morning.)  . potassium chloride (KLOR-CON) 10 MEQ tablet TAKE 1 TABLET BY MOUTH TWICE DAILY  . Probiotic Product  (ALIGN) 4 MG CAPS Take 4 mg by mouth daily after lunch.   . sacubitril-valsartan (ENTRESTO) 49-51 MG Take by mouth 2 (two) times daily.   . traMADol (ULTRAM) 50 MG tablet Take 0.5 tablets (25 mg total) by mouth every 6 (six) hours as needed for moderate pain. Take 1/2 tablet by mouth every 4 hours PRN, moderate pain  . ondansetron (ZOFRAN-ODT) 4 MG disintegrating tablet Take 1 tablet (4 mg total) by mouth every 8 (eight) hours as needed for nausea or vomiting. (Patient not taking: No sig reported)   No facility-administered medications prior to visit.    Review of Systems  Constitutional: Negative for appetite change, chills, fatigue and fever.  Respiratory: Negative for chest tightness and shortness of breath.   Cardiovascular: Negative for chest pain and palpitations.  Gastrointestinal: Negative for abdominal pain, nausea and vomiting.  Neurological: Negative for dizziness and weakness.    Last metabolic panel Lab Results  Component Value Date   GLUCOSE 108 (H) 05/15/2020   NA 136 05/15/2020   K 3.6 05/15/2020   CL 100 05/15/2020   CO2 22 05/15/2020   BUN 15 05/15/2020   CREATININE 0.72 05/15/2020   GFRNONAA 78 05/15/2020   GFRAA 90 05/15/2020   CALCIUM 8.8 05/15/2020   PHOS 3.8 03/27/2020   PROT 6.5 05/08/2020   ALBUMIN 3.9 05/08/2020   LABGLOB 2.3 02/14/2020   AGRATIO 1.7 02/14/2020   BILITOT 1.1 05/08/2020   ALKPHOS 113 05/08/2020   AST  15 05/08/2020   ALT 14 05/08/2020   ANIONGAP 10 01/06/2020       Objective    There were no vitals taken for this visit. BP Readings from Last 3 Encounters:  07/08/20 123/75  05/22/20 129/76  05/15/20 (!) 142/64   Wt Readings from Last 3 Encounters:  07/08/20 136 lb (61.7 kg)  05/22/20 139 lb 8 oz (63.3 kg)  05/15/20 141 lb 11.2 oz (64.3 kg)       Physical Exam Vitals reviewed.  Constitutional:      Appearance: Normal appearance.  HENT:     Head: Normocephalic and atraumatic.     Right Ear: External ear normal.      Left Ear: External ear normal.  Eyes:     General: No scleral icterus.    Conjunctiva/sclera: Conjunctivae normal.  Cardiovascular:     Rate and Rhythm: Normal rate and regular rhythm.     Pulses: Normal pulses.     Heart sounds: Normal heart sounds.  Pulmonary:     Breath sounds: Normal breath sounds.  Abdominal:     Palpations: Abdomen is soft.  Musculoskeletal:     Comments: 1+ edema  Skin:    General: Skin is warm and dry.  Neurological:     Mental Status: She is alert and oriented to person, place, and time. Mental status is at baseline.  Psychiatric:        Mood and Affect: Mood normal.        Behavior: Behavior normal.        Thought Content: Thought content normal.        Judgment: Judgment normal.       No results found for any visits on 07/08/20.  Assessment & Plan     1. Acute on chronic combined systolic and diastolic CHF (congestive heart failure) ([facility]) Continue Entresto and Lasix for now.  Continue to weigh daily.  Follow-up lab work.  2. Essential (primary) hypertension Coreg and Entresto - CBC w/Diff/Platelet - Comprehensive Metabolic Panel (CMET) - TSH  3. Mixed hyperlipidemia Continue atorvastatin - CBC w/Diff/Platelet - Comprehensive Metabolic Panel (CMET) - TSH  4. Gastro-esophageal reflux disease without esophagitis  - CBC w/Diff/Platelet - Comprehensive Metabolic Panel (CMET) - TSH  5. Acute congestive heart failure, unspecified heart failure type ([place]) Acute heart failure team seems to be improved.  NU to treat chronic. - CBC w/Diff/Platelet - Comprehensive Metabolic Panel (CMET) - TSH  6. Coronary artery disease of native artery of native heart with stable angina pectoris ([place]) All risk factors treated.   No follow-ups on file.      I, [name], MD, have reviewed all documentation for this visit. The documentation on 07/13/20 for the exam, diagnosis, procedures, and orders are all accurate and complete.    Richard  Cranford Mon, MD  [facility] [phone] (phone) [phone] (fax)  [place]

## 2020-07-08 ENCOUNTER — Ambulatory Visit: Payer: Medicare Other | Admitting: Family Medicine

## 2020-07-08 ENCOUNTER — Other Ambulatory Visit: Payer: Self-pay

## 2020-07-08 ENCOUNTER — Encounter: Payer: Self-pay | Admitting: Family Medicine

## 2020-07-08 ENCOUNTER — Ambulatory Visit (INDEPENDENT_AMBULATORY_CARE_PROVIDER_SITE_OTHER): Payer: Medicare Other | Admitting: Family Medicine

## 2020-07-08 VITALS — BP 123/75 | HR 79 | Temp 97.9°F | Resp 79 | Ht 62.0 in | Wt 136.0 lb

## 2020-07-08 DIAGNOSIS — E782 Mixed hyperlipidemia: Secondary | ICD-10-CM

## 2020-07-08 DIAGNOSIS — I5043 Acute on chronic combined systolic (congestive) and diastolic (congestive) heart failure: Secondary | ICD-10-CM

## 2020-07-08 DIAGNOSIS — I25118 Atherosclerotic heart disease of native coronary artery with other forms of angina pectoris: Secondary | ICD-10-CM

## 2020-07-08 DIAGNOSIS — K219 Gastro-esophageal reflux disease without esophagitis: Secondary | ICD-10-CM

## 2020-07-08 DIAGNOSIS — I10 Essential (primary) hypertension: Secondary | ICD-10-CM | POA: Diagnosis not present

## 2020-07-08 DIAGNOSIS — I509 Heart failure, unspecified: Secondary | ICD-10-CM | POA: Diagnosis not present

## 2020-07-09 LAB — CBC WITH DIFFERENTIAL/PLATELET
Basophils Absolute: 0.1 10*3/uL (ref 0.0–0.2)
Basos: 1 %
EOS (ABSOLUTE): 0.2 10*3/uL (ref 0.0–0.4)
Eos: 2 %
Hematocrit: 41.4 % (ref 34.0–46.6)
Hemoglobin: 14.1 g/dL (ref 11.1–15.9)
Immature Grans (Abs): 0 10*3/uL (ref 0.0–0.1)
Immature Granulocytes: 0 %
Lymphocytes Absolute: 2.8 10*3/uL (ref 0.7–3.1)
Lymphs: 31 %
MCH: 30.2 pg (ref 26.6–33.0)
MCHC: 34.1 g/dL (ref 31.5–35.7)
MCV: 89 fL (ref 79–97)
Monocytes Absolute: 1 10*3/uL — ABNORMAL HIGH (ref 0.1–0.9)
Monocytes: 11 %
Neutrophils Absolute: 5 10*3/uL (ref 1.4–7.0)
Neutrophils: 55 %
Platelets: 209 10*3/uL (ref 150–450)
RBC: 4.67 x10E6/uL (ref 3.77–5.28)
RDW: 13.7 % (ref 11.7–15.4)
WBC: 9 10*3/uL (ref 3.4–10.8)

## 2020-07-09 LAB — COMPREHENSIVE METABOLIC PANEL
ALT: 24 IU/L (ref 0–32)
AST: 25 IU/L (ref 0–40)
Albumin/Globulin Ratio: 1.3 (ref 1.2–2.2)
Albumin: 4.3 g/dL (ref 3.6–4.6)
Alkaline Phosphatase: 137 IU/L — ABNORMAL HIGH (ref 44–121)
BUN/Creatinine Ratio: 21 (ref 12–28)
BUN: 18 mg/dL (ref 8–27)
Bilirubin Total: 0.7 mg/dL (ref 0.0–1.2)
CO2: 27 mmol/L (ref 20–29)
Calcium: 9.8 mg/dL (ref 8.7–10.3)
Chloride: 102 mmol/L (ref 96–106)
Creatinine, Ser: 0.86 mg/dL (ref 0.57–1.00)
GFR calc Af Amer: 73 mL/min/{1.73_m2} (ref >59)
GFR calc non Af Amer: 63 mL/min/{1.73_m2} (ref >59)
Globulin, Total: 3.2 g/dL (ref 1.5–4.5)
Glucose: 103 mg/dL — ABNORMAL HIGH (ref 65–99)
Potassium: 3.8 mmol/L (ref 3.5–5.2)
Sodium: 143 mmol/L (ref 134–144)
Total Protein: 7.5 g/dL (ref 6.0–8.5)

## 2020-07-09 LAB — TSH: TSH: 2.35 u[IU]/mL (ref 0.450–4.500)

## 2020-07-10 DIAGNOSIS — M1990 Unspecified osteoarthritis, unspecified site: Secondary | ICD-10-CM | POA: Diagnosis not present

## 2020-07-10 DIAGNOSIS — I2510 Atherosclerotic heart disease of native coronary artery without angina pectoris: Secondary | ICD-10-CM | POA: Diagnosis not present

## 2020-07-10 DIAGNOSIS — K219 Gastro-esophageal reflux disease without esophagitis: Secondary | ICD-10-CM | POA: Diagnosis not present

## 2020-07-10 DIAGNOSIS — I110 Hypertensive heart disease with heart failure: Secondary | ICD-10-CM | POA: Diagnosis not present

## 2020-07-10 DIAGNOSIS — I8390 Asymptomatic varicose veins of unspecified lower extremity: Secondary | ICD-10-CM | POA: Diagnosis not present

## 2020-07-10 DIAGNOSIS — I5042 Chronic combined systolic (congestive) and diastolic (congestive) heart failure: Secondary | ICD-10-CM | POA: Diagnosis not present

## 2020-07-11 ENCOUNTER — Ambulatory Visit: Payer: Self-pay | Admitting: *Deleted

## 2020-07-11 NOTE — Telephone Encounter (Signed)
Pt given lab results per notes of Dr. Rosanna Randy on 07/11/20. Pt verbalized understanding of labs in normal/stable range.

## 2020-07-23 DIAGNOSIS — M1990 Unspecified osteoarthritis, unspecified site: Secondary | ICD-10-CM | POA: Diagnosis not present

## 2020-07-23 DIAGNOSIS — I110 Hypertensive heart disease with heart failure: Secondary | ICD-10-CM | POA: Diagnosis not present

## 2020-07-23 DIAGNOSIS — I5042 Chronic combined systolic (congestive) and diastolic (congestive) heart failure: Secondary | ICD-10-CM | POA: Diagnosis not present

## 2020-07-23 DIAGNOSIS — I8390 Asymptomatic varicose veins of unspecified lower extremity: Secondary | ICD-10-CM | POA: Diagnosis not present

## 2020-07-23 DIAGNOSIS — K219 Gastro-esophageal reflux disease without esophagitis: Secondary | ICD-10-CM | POA: Diagnosis not present

## 2020-07-23 DIAGNOSIS — I2510 Atherosclerotic heart disease of native coronary artery without angina pectoris: Secondary | ICD-10-CM | POA: Diagnosis not present

## 2020-07-29 ENCOUNTER — Ambulatory Visit: Payer: Medicare Other | Admitting: Family Medicine

## 2020-07-29 DIAGNOSIS — M65341 Trigger finger, right ring finger: Secondary | ICD-10-CM | POA: Diagnosis not present

## 2020-07-31 ENCOUNTER — Telehealth: Payer: Self-pay

## 2020-07-31 NOTE — Telephone Encounter (Signed)
Copied from [place] [id number]. Topic: General - Other >> Jul 31, 2020  1:21 PM Alanda Slim E wrote: Reason for CRM: Pt walks with a cane and is not in good health and the same with her husband/ their mailbox is across the street from their home and the speed limit on the rd is 67mph / pt was advised by mail carrier to get a letter from her provider to have the mailbox moved to their driveway / please advise

## 2020-08-01 NOTE — Telephone Encounter (Signed)
Letter done and patient notified

## 2020-08-01 NOTE — Telephone Encounter (Signed)
Okay to write letter for this patient due to her heart failure.  Thank you

## 2020-08-08 DIAGNOSIS — H353231 Exudative age-related macular degeneration, bilateral, with active choroidal neovascularization: Secondary | ICD-10-CM | POA: Diagnosis not present

## 2020-08-12 ENCOUNTER — Ambulatory Visit: Payer: Self-pay

## 2020-08-12 DIAGNOSIS — I25118 Atherosclerotic heart disease of native coronary artery with other forms of angina pectoris: Secondary | ICD-10-CM

## 2020-08-12 DIAGNOSIS — I10 Essential (primary) hypertension: Secondary | ICD-10-CM

## 2020-08-12 DIAGNOSIS — I509 Heart failure, unspecified: Secondary | ICD-10-CM

## 2020-08-12 DIAGNOSIS — K219 Gastro-esophageal reflux disease without esophagitis: Secondary | ICD-10-CM
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
## 2020-08-13 ENCOUNTER — Ambulatory Visit (INDEPENDENT_AMBULATORY_CARE_PROVIDER_SITE_OTHER): Payer: Medicare Other

## 2020-08-13 ENCOUNTER — Other Ambulatory Visit: Payer: Self-pay

## 2020-08-13 DIAGNOSIS — Z0000 Encounter for general adult medical examination without abnormal findings: Secondary | ICD-10-CM | POA: Diagnosis not present

## 2020-08-13 NOTE — Patient Instructions (Signed)
Teresa Moyer , Thank you for taking time to come for your Medicare Wellness Visit. I appreciate your ongoing commitment to your health goals. Please review the following plan we discussed and let me know if I can assist you in the future.   Screening recommendations/referrals: Colonoscopy: No longer required.  Mammogram: No longer required.  Bone Density: Previous DEXA scan was normal. No repeat needed unless advised by a physician. Recommended yearly ophthalmology/optometry visit for glaucoma screening and checkup Recommended yearly dental visit for hygiene and checkup  Vaccinations: Influenza vaccine: Done 05/22/20 Pneumococcal vaccine: Completed series Tdap vaccine: Currently due, declined receiving.  Shingles vaccine: Shingrix discussed. Please contact your pharmacy for coverage information.     Advanced directives: Currently on file.   Conditions/risks identified: Continue to monitor and increase water intake.  Next appointment: 09/25/20 @ 10:20 AM with Dr Rosanna Randy. Declined scheduling an AWV for 2023 at this time.    Preventive Care 83 Years and Older, Female Preventive care refers to lifestyle choices and visits with your health care provider that can promote health and wellness. What does preventive care include?  A yearly physical exam. This is also called an annual well check.  Dental exams once or twice a year.  Routine eye exams. Ask your health care provider how often you should have your eyes checked.  Personal lifestyle choices, including:  Daily care of your teeth and gums.  Regular physical activity.  Eating a healthy diet.  Avoiding tobacco and drug use.  Limiting alcohol use.  Practicing safe sex.  Taking low-dose aspirin every day.  Taking vitamin and mineral supplements as recommended by your health care provider. What happens during an annual well check? The services and screenings done by your health care provider during your annual well check will  depend on your age, overall health, lifestyle risk factors, and family history of disease. Counseling  Your health care provider may ask you questions about your:  Alcohol use.  Tobacco use.  Drug use.  Emotional well-being.  Home and relationship well-being.  Sexual activity.  Eating habits.  History of falls.  Memory and ability to understand (cognition).  Work and work Statistician.  Reproductive health. Screening  You may have the following tests or measurements:  Height, weight, and BMI.  Blood pressure.  Lipid and cholesterol levels. These may be checked every 5 years, or more frequently if you are over 83 years old.  Skin check.  Lung cancer screening. You may have this screening every year starting at age 833 if you have a 30-pack-year history of smoking and currently smoke or have quit within the past 15 years.  Fecal occult blood test (FOBT) of the stool. You may have this test every year starting at age 83.  Flexible sigmoidoscopy or colonoscopy. You may have a sigmoidoscopy every 5 years or a colonoscopy every 10 years starting at age 83.  Hepatitis C blood test.  Hepatitis B blood test.  Sexually transmitted disease (STD) testing.  Diabetes screening. This is done by checking your blood sugar (glucose) after you have not eaten for a while (fasting). You may have this done every 1-3 years.  Bone density scan. This is done to screen for osteoporosis. You may have this done starting at age 83.  Mammogram. This may be done every 1-2 years. Talk to your health care provider about how often you should have regular mammograms. Talk with your health care provider about your test results, treatment options, and if necessary, the need  for more tests. Vaccines  Your health care provider may recommend certain vaccines, such as:  Influenza vaccine. This is recommended every year.  Tetanus, diphtheria, and acellular pertussis (Tdap, Td) vaccine. You may need a  Td booster every 10 years.  Zoster vaccine. You may need this after age 83.  Pneumococcal 13-valent conjugate (PCV13) vaccine. One dose is recommended after age 83.  Pneumococcal polysaccharide (PPSV23) vaccine. One dose is recommended after age 83. Talk to your health care provider about which screenings and vaccines you need and how often you need them. This information is not intended to replace advice given to you by your health care provider. Make sure you discuss any questions you have with your health care provider. Document Released: 06/28/2015 Document Revised: 02/19/2016 Document Reviewed: 04/02/2015 Elsevier Interactive Patient Education  2017 [place] Prevention in the Home Falls can cause injuries. They can happen to people of all ages. There are many things you can do to make your home safe and to help prevent falls. What can I do on the outside of my home?  Regularly fix the edges of walkways and driveways and fix any cracks.  Remove anything that might make you trip as you walk through a door, such as a raised step or threshold.  Trim any bushes or trees on the path to your home.  Use bright outdoor lighting.  Clear any walking paths of anything that might make someone trip, such as rocks or tools.  Regularly check to see if handrails are loose or broken. Make sure that both sides of any steps have handrails.  Any raised decks and porches should have guardrails on the edges.  Have any leaves, snow, or ice cleared regularly.  Use sand or salt on walking paths during winter.  Clean up any spills in your garage right away. This includes oil or grease spills. What can I do in the bathroom?  Use night lights.  Install grab bars by the toilet and in the tub and shower. Do not use towel bars as grab bars.  Use non-skid mats or decals in the tub or shower.  If you need to sit down in the shower, use a plastic, non-slip stool.  Keep the floor dry. Clean  up any water that spills on the floor as soon as it happens.  Remove soap buildup in the tub or shower regularly.  Attach bath mats securely with double-sided non-slip rug tape.  Do not have throw rugs and other things on the floor that can make you trip. What can I do in the bedroom?  Use night lights.  Make sure that you have a light by your bed that is easy to reach.  Do not use any sheets or blankets that are too big for your bed. They should not hang down onto the floor.  Have a firm chair that has side arms. You can use this for support while you get dressed.  Do not have throw rugs and other things on the floor that can make you trip. What can I do in the kitchen?  Clean up any spills right away.  Avoid walking on wet floors.  Keep items that you use a lot in easy-to-reach places.  If you need to reach something above you, use a strong step stool that has a grab bar.  Keep electrical cords out of the way.  Do not use floor polish or wax that makes floors slippery. If you must use wax, use  non-skid floor wax.  Do not have throw rugs and other things on the floor that can make you trip. What can I do with my stairs?  Do not leave any items on the stairs.  Make sure that there are handrails on both sides of the stairs and use them. Fix handrails that are broken or loose. Make sure that handrails are as long as the stairways.  Check any carpeting to make sure that it is firmly attached to the stairs. Fix any carpet that is loose or worn.  Avoid having throw rugs at the top or bottom of the stairs. If you do have throw rugs, attach them to the floor with carpet tape.  Make sure that you have a light switch at the top of the stairs and the bottom of the stairs. If you do not have them, ask someone to add them for you. What else can I do to help prevent falls?  Wear shoes that:  Do not have high heels.  Have rubber bottoms.  Are comfortable and fit you well.  Are  closed at the toe. Do not wear sandals.  If you use a stepladder:  Make sure that it is fully opened. Do not climb a closed stepladder.  Make sure that both sides of the stepladder are locked into place.  Ask someone to hold it for you, if possible.  Clearly mark and make sure that you can see:  Any grab bars or handrails.  First and last steps.  Where the edge of each step is.  Use tools that help you move around (mobility aids) if they are needed. These include:  Canes.  Walkers.  Scooters.  Crutches.  Turn on the lights when you go into a dark area. Replace any light bulbs as soon as they burn out.  Set up your furniture so you have a clear path. Avoid moving your furniture around.  If any of your floors are uneven, fix them.  If there are any pets around you, be aware of where they are.  Review your medicines with your doctor. Some medicines can make you feel dizzy. This can increase your chance of falling. Ask your doctor what other things that you can do to help prevent falls. This information is not intended to replace advice given to you by your health care provider. Make sure you discuss any questions you have with your health care provider. Document Released: 03/28/2009 Document Revised: 11/07/2015 Document Reviewed: 07/06/2014 Elsevier Interactive Patient Education  2017 [employer].

## 2020-08-19 NOTE — Chronic Care Management (AMB) (Signed)
Chronic Care Management   Follow Up Note   08/19/2020 Name: Teresa Moyer MRN: [id number] DOB: 1935/11/10  Primary Care Provider: Jerrol Banana., MD Reason for referral : Chronic Care Management   Teresa Moyer is a 85 y.o. year old female who is a primary care patient of Jerrol Banana., MD.  A telephonic outreach was conducted today. She has met her care management goals.  Review of Teresa Moyer's status, including review of consultants reports, relevant labs and test results was conducted today. Collaboration with appropriate care team members was performed as part of the comprehensive evaluation and provision of chronic care management services.    SDOH (Social Determinants of Health) assessments performed: No     Outpatient Encounter Medications as of 08/12/2020  Medication Sig Note  . atorvastatin (LIPITOR) 40 MG tablet Take 1 tablet (40 mg total) by mouth daily at 6 PM.   . Carboxymethylcell-Hypromellose (GENTEAL) 0.25-0.3 % GEL Apply 1 application to eye at bedtime.    . carvedilol (COREG) 12.5 MG tablet Take 12.5 mg by mouth 2 (two) times daily with a meal.    . cetirizine (ZYRTEC) 10 MG tablet Take 10 mg by mouth at bedtime.    . fluticasone (FLONASE) 50 MCG/ACT nasal spray Place 1 spray into both nostrils daily.    . furosemide (LASIX) 40 MG tablet Take 1 tablet (40 mg total) by mouth 2 (two) times daily.   . Multiple Vitamins-Minerals (PRESERVISION AREDS PO) Take 1 tablet by mouth 2 (two) times daily.    Marland Kitchen omeprazole (PRILOSEC) 20 MG capsule TAKE 1 CAPSULE BY MOUTH ONCE DAILY (Patient taking differently: Take 20 mg by mouth every morning.)   . ondansetron (ZOFRAN-ODT) 4 MG disintegrating tablet Take 1 tablet (4 mg total) by mouth every 8 (eight) hours as needed for nausea or vomiting. (Patient not taking: No sig reported)   . potassium chloride (KLOR-CON) 10 MEQ tablet TAKE 1 TABLET BY MOUTH TWICE DAILY (Patient taking differently: Take 20 mEq by mouth  daily.)   . Probiotic Product (ALIGN) 4 MG CAPS Take 8 mg by mouth daily after lunch. 02/08/2020: Reports currently taking 2 a day  . sacubitril-valsartan (ENTRESTO) 49-51 MG Take by mouth 2 (two) times daily.    . traMADol (ULTRAM) 50 MG tablet Take 0.5 tablets (25 mg total) by mouth every 6 (six) hours as needed for moderate pain. Take 1/2 tablet by mouth every 4 hours PRN, moderate pain (Patient not taking: Reported on 08/13/2020)    No facility-administered encounter medications on file as of 08/12/2020.      Objective:  Goals Addressed            This Visit's Progress   . Chronic Disease Management       CARE PLAN ENTRY (see longitudinal plan of care for additional care plan information)  Current Barriers:  . Chronic Disease Management support and education needs related to HTN, CAP, CHF, GERD, and HLD. (Hx of Skin Ca)  Case Manager Clinical Goal(s):  Over the next 90 days, patient will: . Not require hospitalization d/t complications r/t chronic illnesses. Over the next 120 days, patient will: . Continue to take all medications as prescribed. . Continue attending appointments as scheduled. . Monitor blood pressure and record readings. . Monitor weight and record readings. . Adhere to recommended cardiac prudent/heart healthy diet. . Follow recommended safety precautions to prevent falls and injuries.   Interventions:  . Inter-disciplinary care team collaboration (see longitudinal plan of  care) . Discussed plan for care management.  Teresa Moyer has met her care management goals. Remains compliant with medications and treatment recommendations. Reports monitoring BP and weights as advised. Denies worsening s/sx r/t COPD exacerbation. No decline in activity tolerance or ability to perform self care. Reports doing well. No new concerns or changes in care management needs. She will follow up with the Cardiology team next month. She is scheduled for a PCP/clinic visit in April. She  agreed to notify Dr. Rosanna Randy or call if her health needs change and additional outreach/care coordination is required.       Patient Self Care Activities:  . Self administers medications. . Attends scheduled provider appointments . Calls pharmacy for medication refills . Performs ADL's independently . Performs IADL's independently . Calls provider office for new concerns or questions   Please see past updates related to this goal by clicking on the "Past Updates" button in the selected goal            PLAN Teresa Moyer has met her care management goals. She is scheduled for a clinic follow up in April. She agreed to notify Dr. Rosanna Randy or call if her health status changes and additional outreach/care coordination is required. The care management team will gladly assist.     [facility] Care Management [facility] [phone]

## 2020-08-19 NOTE — Patient Instructions (Signed)
  Thank you for allowing the Chronic Care Management team to participate in your care.    Goals Addressed            This Visit's Progress   . Chronic Disease Management       CARE PLAN ENTRY (see longitudinal plan of care for additional care plan information)  Current Barriers:  . Chronic Disease Management support and education needs related to HTN, CAP, CHF, GERD, and HLD. (Hx of Skin Ca)  Case Manager Clinical Goal(s):  Over the next 90 days, patient will: . Not require hospitalization d/t complications r/t chronic illnesses. Over the next 120 days, patient will: . Continue to take all medications as prescribed. . Continue attending appointments as scheduled. . Monitor blood pressure and record readings. . Monitor weight and record readings. . Adhere to recommended cardiac prudent/heart healthy diet. . Follow recommended safety precautions to prevent falls and injuries.   Interventions:  . Inter-disciplinary care team collaboration (see longitudinal plan of care) . Discussed plan for care management.  Teresa Moyer has met her care management goals. Remains compliant with medications and treatment recommendations. Reports monitoring BP and weights as advised. Denies worsening s/sx r/t COPD exacerbation. No decline in activity tolerance or ability to perform self care. Reports doing well. No new concerns or changes in care management needs. She will follow up with the Cardiology team next month. She is scheduled for a PCP/clinic visit in April. She agreed to notify Dr. Rosanna Randy or call if her health needs change and additional outreach/care coordination is required.       Patient Self Care Activities:  . Self administers medications. . Attends scheduled provider appointments . Calls pharmacy for medication refills . Performs ADL's independently . Performs IADL's independently . Calls provider office for new concerns or questions   Please see past updates related to this goal  by clicking on the "Past Updates" button in the selected goal            Teresa Moyer verbalized understanding of the information discussed during the telephonic outreach today. Declined need for mailed/printed instructions or resources. She has met her care management goals. She is scheduled for a clinic follow up in April. She agreed to notify Dr. Rosanna Randy or call if her health status changes and additional outreach/care coordination is required. The care management team will gladly assist.     [facility] Care Management [facility] [phone]

## 2020-08-26 ENCOUNTER — Ambulatory Visit
Admission: RE | Admit: 2020-08-26 | Discharge: 2020-08-26 | Disposition: A | Discharge status: Home / Self Care | Payer: Medicare Other | Source: Ambulatory Visit | Attending: Family Medicine | Admitting: Family Medicine

## 2020-08-26 ENCOUNTER — Encounter: Payer: Self-pay | Admitting: Family Medicine

## 2020-08-26 ENCOUNTER — Other Ambulatory Visit: Payer: Self-pay

## 2020-08-26 ENCOUNTER — Ambulatory Visit: Payer: Self-pay | Admitting: *Deleted

## 2020-08-26 ENCOUNTER — Ambulatory Visit (INDEPENDENT_AMBULATORY_CARE_PROVIDER_SITE_OTHER): Payer: Medicare Other | Admitting: Family Medicine

## 2020-08-26 VITALS — BP 154/79 | HR 75 | Temp 98.2°F | Resp 18 | Ht 62.0 in | Wt 143.0 lb

## 2020-08-26 DIAGNOSIS — I25118 Atherosclerotic heart disease of native coronary artery with other forms of angina pectoris: Secondary | ICD-10-CM

## 2020-08-26 DIAGNOSIS — I5043 Acute on chronic combined systolic (congestive) and diastolic (congestive) heart failure: Secondary | ICD-10-CM

## 2020-08-26 DIAGNOSIS — J90 Pleural effusion, not elsewhere classified: Secondary | ICD-10-CM | POA: Diagnosis not present

## 2020-08-26 DIAGNOSIS — M546 Pain in thoracic spine: Secondary | ICD-10-CM | POA: Diagnosis not present

## 2020-08-26 DIAGNOSIS — I5042 Chronic combined systolic (congestive) and diastolic (congestive) heart failure: Secondary | ICD-10-CM

## 2020-08-26 DIAGNOSIS — M549 Dorsalgia, unspecified: Secondary | ICD-10-CM | POA: Insufficient documentation

## 2020-08-26 DIAGNOSIS — E782 Mixed hyperlipidemia: Secondary | ICD-10-CM | POA: Diagnosis not present

## 2020-08-26 MED ORDER — METOLAZONE 2.5 MG PO TABS: 2.5000 mg | ORAL_TABLET | Freq: Every day | ORAL | 3 refills | Status: DC

## 2020-08-26 NOTE — Telephone Encounter (Signed)
Pt called with complaints of vomiting, "swollen stomach"; she also says her weight has gone up 4.4 lbs in 1 week; she had 1 episode of vomiting on 0800; she believes this is due to taking Tylenol 2 tablets  (650 mg) x 2 doses and Celebrex 1 tablet x 2 doses due to back pain and she believes this may have upset her stomach; the pt also says she needs a muscle relaxer; recommendations made per nurse triage protocol; decision tree completed; she would like to be seen in the office;  pt offered and accepted appt wit Dr Miguel Aschoff; [facility], 08/26/20 at 59; she verbalized understanding; will route to office for notification.  Reason for Disposition . MILD or MODERATE vomiting (e.g., 1 - 5 times / day)  Answer Assessment - Initial Assessment Questions 1. VOMITING SEVERITY: "How many times have you vomited in the past 24 hours?"     - MILD:  1 - 2 times/day    - MODERATE: 3 - 5 times/day, decreased oral intake without significant weight loss or symptoms of dehydration    - SEVERE: 6 or more times/day, vomits everything or nearly everything, with significant weight loss, symptoms of dehydration      1 episode 2. ONSET: "When did the vomiting begin?"     08/26/20 3. FLUIDS: "What fluids or food have you vomited up today?" "Have you been able to keep any fluids down?"     Vomited breakfast 4. ABDOMINAL PAIN: "Are your having any abdominal pain?" If yes : "How bad is it and what does it feel like?" (e.g., crampy, dull, intermittent, constant)     5. DIARRHEA: "Is there any diarrhea?" If Yes, ask: "How many times today?"    no 6. CONTACTS: "Is there anyone else in the family with the same symptoms?"       7. CAUSE: "What do you think is causing your vomiting?"     Took Tylenol and Celebrex for back pain 8. HYDRATION STATUS: "Any signs of dehydration?" (e.g., dry mouth [not only dry lips], too weak to stand) "When did you last urinate?"      9. OTHER SYMPTOMS: "Do you have any other  symptoms?" (e.g., fever, headache, vertigo, vomiting blood or coffee grounds, recent head injury)     4.4 lb weight gain in the past week 10. PREGNANCY: "Is there any chance you are pregnant?" "When was your last menstrual period?"       no  Protocols used: [facility]

## 2020-08-26 NOTE — Progress Notes (Signed)
I,[name],acting as a scribe for [name], MD.,have documented all relevant documentation on the behalf of [name], MD,as directed by  [name], MD while in the presence of [name], MD.   Established patient visit   Patient: Teresa Moyer   DOB: [birth year]   85 y.o. Female  MRN: [id number] Visit Date: 08/26/2020  Today's healthcare provider: [name], MD   No chief complaint on file.  Subjective    HPI  Patient has one episode this morning where she vomited. Patient states she believes her stomach became upset after she took 2 extra strength tylenol and one Celebrex. Patient states she took medications because her back has been hurting for the past 3 days. Patient wants to speak to provider about prescribing a muscle relaxer.  She has had no trauma no falls.  The pain  is in the lower thoracic area.  Patient has also had a 4.4lb weight gain in one week.  Her breathing is okay and oxygen level has been fine Patient is under stress as her husband was admitted over the weekend to the hospital with CHF.     Medications: Outpatient Medications Prior to Visit  Medication Sig   acetaminophen (TYLENOL) 325 MG tablet Take 650 mg by mouth every 6 (six) hours as needed.   atorvastatin (LIPITOR) 40 MG tablet Take 1 tablet (40 mg total) by mouth daily at 6 PM.   azelastine (ASTELIN) 0.1 % nasal spray Place 1 spray into both nostrils 2 (two) times daily.   Carboxymethylcell-Hypromellose (GENTEAL) 0.25-0.3 % GEL Apply 1 application to eye at bedtime.    carvedilol (COREG) 12.5 MG tablet Take 12.5 mg by mouth 2 (two) times daily with a meal.    cetirizine (ZYRTEC) 10 MG tablet Take 10 mg by mouth at bedtime.    fluticasone (FLONASE) 50 MCG/ACT nasal spray Place 1 spray into both nostrils daily.    furosemide (LASIX) 40 MG tablet Take 1 tablet (40 mg total) by mouth 2 (two) times daily.   Multiple Vitamins-Minerals (PRESERVISION  AREDS PO) Take 1 tablet by mouth 2 (two) times daily.    omeprazole (PRILOSEC) 20 MG capsule TAKE 1 CAPSULE BY MOUTH ONCE DAILY (Patient taking differently: Take 20 mg by mouth every morning.)   potassium chloride (KLOR-CON) 10 MEQ tablet TAKE 1 TABLET BY MOUTH TWICE DAILY (Patient taking differently: Take 20 mEq by mouth daily.)   Probiotic Product (ALIGN) 4 MG CAPS Take 8 mg by mouth daily after lunch.   sacubitril-valsartan (ENTRESTO) 49-51 MG Take by mouth 2 (two) times daily.    ondansetron (ZOFRAN-ODT) 4 MG disintegrating tablet Take 1 tablet (4 mg total) by mouth every 8 (eight) hours as needed for nausea or vomiting. (Patient not taking: No sig reported)   traMADol (ULTRAM) 50 MG tablet Take 0.5 tablets (25 mg total) by mouth every 6 (six) hours as needed for moderate pain. Take 1/2 tablet by mouth every 4 hours PRN, moderate pain (Patient not taking: No sig reported)   No facility-administered medications prior to visit.    Review of Systems  Constitutional: Negative for appetite change, chills, fatigue and fever.  Respiratory: Negative for chest tightness and shortness of breath.   Cardiovascular: Negative for chest pain and palpitations.  Gastrointestinal: Negative for abdominal pain, nausea and vomiting.  Musculoskeletal: Positive for back pain.  Neurological: Negative for dizziness and weakness.        Objective    BP Marland Kitchen)  154/79 (BP Location: Left Arm, Patient Position: Sitting, Cuff Size: Normal)    Pulse 75    Temp 98.2 F (36.8 C) (Oral)    Resp 18    Ht 5\' 2"  (1.575 m)    Wt 143 lb (64.9 kg)    SpO2 97%    BMI 26.16 kg/m  BP Readings from Last 3 Encounters:  08/26/20 (!) 154/79  07/08/20 123/75  05/22/20 129/76   Wt Readings from Last 3 Encounters:  08/26/20 143 lb (64.9 kg)  07/08/20 136 lb (61.7 kg)  05/22/20 139 lb 8 oz (63.3 kg)       Physical Exam Vitals reviewed.  Constitutional:      Appearance: Normal appearance.  HENT:     Head: Normocephalic  and atraumatic.     Right Ear: External ear normal.     Left Ear: External ear normal.  Eyes:     General: No scleral icterus.    Conjunctiva/sclera: Conjunctivae normal.  Cardiovascular:     Rate and Rhythm: Normal rate and regular rhythm.     Pulses: Normal pulses.     Heart sounds: Normal heart sounds.  Pulmonary:     Breath sounds: Normal breath sounds.  Abdominal:     Palpations: Abdomen is soft.  Musculoskeletal:     Comments: 1+ edema. She has some mild tenderness directly over the mid thoracic spine.  Skin:    General: Skin is warm and dry.  Neurological:     Mental Status: She is alert and oriented to person, place, and time. Mental status is at baseline.  Psychiatric:        Mood and Affect: Mood normal.        Behavior: Behavior normal.        Thought Content: Thought content normal.        Judgment: Judgment normal.       No results found for any visits on 08/26/20.  Assessment & Plan     1. Acute on chronic combined systolic and diastolic CHF (congestive heart failure) (HCC) Add metolazone 2.5 every morning for 5 days.  Check renal panel on next visit  2. Chronic combined systolic and diastolic heart failure ([place]) On Entresto. - DG Chest 2 View  3. Mid back pain Obtain thoracic x-ray to rule out thoracic vertebral fracture although unlikely without trauma.  Avoid nonsteroidals - DG Thoracic Spine 2 View  4. Coronary artery disease of native artery of native heart with stable angina pectoris ([place]) All risk factors treated  5. Mixed hyperlipidemia On atorvastatin 40   No follow-ups on file.      I, [name], MD, have reviewed all documentation for this visit. The documentation on 08/27/20 for the exam, diagnosis, procedures, and orders are all accurate and complete.   Jasiri Hanawalt Cranford Mon, MD  [facility] [phone] (phone) [phone] (fax)  [place]

## 2020-08-27 ENCOUNTER — Other Ambulatory Visit: Payer: Self-pay

## 2020-08-27 DIAGNOSIS — M549 Dorsalgia, unspecified: Secondary | ICD-10-CM

## 2020-08-27 NOTE — Telephone Encounter (Signed)
Please review. Thanks!  

## 2020-08-27 NOTE — Telephone Encounter (Signed)
Try flexeril 5mg  TID prn,#30,1rf

## 2020-08-27 NOTE — Telephone Encounter (Signed)
Copied from [place] [id number]. Topic: General - Other >> Aug 27, 2020 10:59 AM [name], Teresa Moyer wrote: Reason for CRM: Pt wanted to let Dr Rosanna Randy know her pain has not improved, her back is still hurting, and she thinks she needs Moyer muscle relaxer. Please advise.

## 2020-08-27 NOTE — Telephone Encounter (Signed)
Patient was advised.  

## 2020-08-28 DIAGNOSIS — M47896 Other spondylosis, lumbar region: Secondary | ICD-10-CM | POA: Diagnosis not present

## 2020-08-28 MED ORDER — CYCLOBENZAPRINE HCL 5 MG PO TABS: 5.0000 mg | ORAL_TABLET | Freq: Three times a day (TID) | ORAL | 1 refills | Status: DC | PRN

## 2020-08-30 ENCOUNTER — Other Ambulatory Visit: Payer: Self-pay | Admitting: Physician Assistant

## 2020-08-30 DIAGNOSIS — I5043 Acute on chronic combined systolic (congestive) and diastolic (congestive) heart failure: Secondary | ICD-10-CM

## 2020-08-31 ENCOUNTER — Encounter: Payer: Self-pay | Admitting: Emergency Medicine

## 2020-08-31 ENCOUNTER — Inpatient Hospital Stay
Admission: EM | Admit: 2020-08-31 | Discharge: 2020-09-05 | DRG: 641 | Disposition: A | Discharge status: Home Health | Payer: Medicare Other | Attending: Internal Medicine | Admitting: Internal Medicine

## 2020-08-31 ENCOUNTER — Emergency Department: Payer: Medicare Other

## 2020-08-31 ENCOUNTER — Other Ambulatory Visit: Payer: Self-pay

## 2020-08-31 DIAGNOSIS — Z888 Allergy status to other drugs, medicaments and biological substances status: Secondary | ICD-10-CM | POA: Diagnosis not present

## 2020-08-31 DIAGNOSIS — K529 Noninfective gastroenteritis and colitis, unspecified: Secondary | ICD-10-CM | POA: Diagnosis present

## 2020-08-31 DIAGNOSIS — R110 Nausea: Secondary | ICD-10-CM | POA: Diagnosis not present

## 2020-08-31 DIAGNOSIS — R5381 Other malaise: Secondary | ICD-10-CM | POA: Diagnosis not present

## 2020-08-31 DIAGNOSIS — E860 Dehydration: Secondary | ICD-10-CM | POA: Diagnosis present

## 2020-08-31 DIAGNOSIS — Z20822 Contact with and (suspected) exposure to covid-19: Secondary | ICD-10-CM | POA: Diagnosis present

## 2020-08-31 DIAGNOSIS — R42 Dizziness and giddiness: Secondary | ICD-10-CM | POA: Diagnosis not present

## 2020-08-31 DIAGNOSIS — R112 Nausea with vomiting, unspecified: Secondary | ICD-10-CM | POA: Diagnosis not present

## 2020-08-31 DIAGNOSIS — N3289 Other specified disorders of bladder: Secondary | ICD-10-CM | POA: Diagnosis present

## 2020-08-31 DIAGNOSIS — E878 Other disorders of electrolyte and fluid balance, not elsewhere classified: Secondary | ICD-10-CM

## 2020-08-31 DIAGNOSIS — I252 Old myocardial infarction: Secondary | ICD-10-CM | POA: Diagnosis not present

## 2020-08-31 DIAGNOSIS — D72829 Elevated white blood cell count, unspecified: Secondary | ICD-10-CM | POA: Diagnosis present

## 2020-08-31 DIAGNOSIS — Z85828 Personal history of other malignant neoplasm of skin: Secondary | ICD-10-CM | POA: Diagnosis not present

## 2020-08-31 DIAGNOSIS — Z882 Allergy status to sulfonamides status: Secondary | ICD-10-CM | POA: Diagnosis not present

## 2020-08-31 DIAGNOSIS — Z96653 Presence of artificial knee joint, bilateral: Secondary | ICD-10-CM | POA: Diagnosis present

## 2020-08-31 DIAGNOSIS — K7460 Unspecified cirrhosis of liver: Secondary | ICD-10-CM | POA: Diagnosis present

## 2020-08-31 DIAGNOSIS — R531 Weakness: Secondary | ICD-10-CM | POA: Diagnosis not present

## 2020-08-31 DIAGNOSIS — E871 Hypo-osmolality and hyponatremia: Principal | ICD-10-CM

## 2020-08-31 DIAGNOSIS — R1111 Vomiting without nausea: Secondary | ICD-10-CM | POA: Diagnosis not present

## 2020-08-31 DIAGNOSIS — Z8249 Family history of ischemic heart disease and other diseases of the circulatory system: Secondary | ICD-10-CM | POA: Diagnosis not present

## 2020-08-31 DIAGNOSIS — I110 Hypertensive heart disease with heart failure: Secondary | ICD-10-CM | POA: Diagnosis present

## 2020-08-31 DIAGNOSIS — I2510 Atherosclerotic heart disease of native coronary artery without angina pectoris: Secondary | ICD-10-CM | POA: Diagnosis present

## 2020-08-31 DIAGNOSIS — I509 Heart failure, unspecified: Secondary | ICD-10-CM | POA: Diagnosis not present

## 2020-08-31 DIAGNOSIS — J9811 Atelectasis: Secondary | ICD-10-CM | POA: Diagnosis not present

## 2020-08-31 DIAGNOSIS — R339 Retention of urine, unspecified: Secondary | ICD-10-CM | POA: Diagnosis not present

## 2020-08-31 DIAGNOSIS — K219 Gastro-esophageal reflux disease without esophagitis: Secondary | ICD-10-CM | POA: Diagnosis present

## 2020-08-31 DIAGNOSIS — Z885 Allergy status to narcotic agent status: Secondary | ICD-10-CM

## 2020-08-31 DIAGNOSIS — I2699 Other pulmonary embolism without acute cor pulmonale: Secondary | ICD-10-CM | POA: Diagnosis not present

## 2020-08-31 DIAGNOSIS — Z880 Allergy status to penicillin: Secondary | ICD-10-CM

## 2020-08-31 DIAGNOSIS — R1310 Dysphagia, unspecified: Secondary | ICD-10-CM | POA: Diagnosis present

## 2020-08-31 DIAGNOSIS — Z955 Presence of coronary angioplasty implant and graft: Secondary | ICD-10-CM | POA: Diagnosis not present

## 2020-08-31 DIAGNOSIS — I5022 Chronic systolic (congestive) heart failure: Secondary | ICD-10-CM | POA: Diagnosis present

## 2020-08-31 DIAGNOSIS — E876 Hypokalemia: Secondary | ICD-10-CM

## 2020-08-31 DIAGNOSIS — R109 Unspecified abdominal pain: Secondary | ICD-10-CM | POA: Diagnosis not present

## 2020-08-31 DIAGNOSIS — N1330 Unspecified hydronephrosis: Secondary | ICD-10-CM | POA: Diagnosis present

## 2020-08-31 DIAGNOSIS — I517 Cardiomegaly: Secondary | ICD-10-CM | POA: Diagnosis not present

## 2020-08-31 LAB — BASIC METABOLIC PANEL
Anion gap: 12 (ref 5–15)
Anion gap: 13 (ref 5–15)
BUN: 22 mg/dL (ref 8–23)
BUN: 22 mg/dL (ref 8–23)
CO2: 25 mmol/L (ref 22–32)
CO2: 26 mmol/L (ref 22–32)
Calcium: 8.8 mg/dL — ABNORMAL LOW (ref 8.9–10.3)
Calcium: 8.8 mg/dL — ABNORMAL LOW (ref 8.9–10.3)
Chloride: 68 mmol/L — ABNORMAL LOW (ref 98–111)
Chloride: 67 mmol/L — ABNORMAL LOW (ref 98–111)
Creatinine, Ser: 0.67 mg/dL (ref 0.44–1.00)
Creatinine, Ser: 0.71 mg/dL (ref 0.44–1.00)
GFR, Estimated: >60 mL/min (ref >60)
GFR, Estimated: >60 mL/min (ref >60)
Glucose, Bld: 133 mg/dL — ABNORMAL HIGH (ref 70–99)
Glucose, Bld: 122 mg/dL — ABNORMAL HIGH (ref 70–99)
Potassium: 2.7 mmol/L — CL (ref 3.5–5.1)
Potassium: 2.9 mmol/L — ABNORMAL LOW (ref 3.5–5.1)
Sodium: 105 mmol/L — CL (ref 135–145)
Sodium: 106 mmol/L — CL (ref 135–145)

## 2020-08-31 LAB — URINALYSIS, COMPLETE (UACMP) WITH MICROSCOPIC
Bacteria, UA: NONE SEEN
Bilirubin Urine: NEGATIVE
Glucose, UA: NEGATIVE mg/dL
Hgb urine dipstick: NEGATIVE
Ketones, ur: NEGATIVE mg/dL
Leukocytes,Ua: NEGATIVE
Nitrite: NEGATIVE
Protein, ur: NEGATIVE mg/dL
Specific Gravity, Urine: 1.02 (ref 1.005–1.030)
pH: 8 (ref 5.0–8.0)

## 2020-08-31 LAB — HEPATIC FUNCTION PANEL
ALT: 24 U/L (ref 0–44)
AST: 33 U/L (ref 15–41)
Albumin: 3.7 g/dL (ref 3.5–5.0)
Alkaline Phosphatase: 90 U/L (ref 38–126)
Bilirubin, Direct: 0.3 mg/dL — ABNORMAL HIGH (ref 0.0–0.2)
Indirect Bilirubin: 1.4 mg/dL — ABNORMAL HIGH (ref 0.3–0.9)
Total Bilirubin: 1.7 mg/dL — ABNORMAL HIGH (ref 0.3–1.2)
Total Protein: 6.8 g/dL (ref 6.5–8.1)

## 2020-08-31 LAB — CBC
HCT: 34.2 % — ABNORMAL LOW (ref 36.0–46.0)
Hemoglobin: 12.8 g/dL (ref 12.0–15.0)
MCH: 30.9 pg (ref 26.0–34.0)
MCHC: 37.4 g/dL — ABNORMAL HIGH (ref 30.0–36.0)
MCV: 82.6 fL (ref 80.0–100.0)
Platelets: 227 10*3/uL (ref 150–400)
RBC: 4.14 MIL/uL (ref 3.87–5.11)
RDW: 13.3 % (ref 11.5–15.5)
WBC: 20.9 10*3/uL — ABNORMAL HIGH (ref 4.0–10.5)
nRBC: 0 % (ref 0.0–0.2)

## 2020-08-31 LAB — OSMOLALITY: Osmolality: 227 mOsm/kg — CL (ref 275–295)

## 2020-08-31 LAB — TROPONIN I (HIGH SENSITIVITY)
Troponin I (High Sensitivity): 17 ng/L (ref <18)
Troponin I (High Sensitivity): 18 ng/L — ABNORMAL HIGH (ref <18)

## 2020-08-31 LAB — BRAIN NATRIURETIC PEPTIDE: B Natriuretic Peptide: 477 pg/mL — ABNORMAL HIGH (ref 0.0–100.0)

## 2020-08-31 LAB — RESP PANEL BY RT-PCR (FLU A&B, COVID) ARPGX2
Influenza A by PCR: NEGATIVE
Influenza B by PCR: NEGATIVE
SARS Coronavirus 2 by RT PCR: NEGATIVE

## 2020-08-31 LAB — SODIUM: Sodium: 107 mmol/L — CL (ref 135–145)

## 2020-08-31 LAB — LACTIC ACID, PLASMA: Lactic Acid, Venous: 1.1 mmol/L (ref 0.5–1.9)

## 2020-08-31 LAB — MAGNESIUM: Magnesium: 1.8 mg/dL (ref 1.7–2.4)

## 2020-08-31 LAB — PROTIME-INR
INR: 1 (ref 0.8–1.2)
Prothrombin Time: 13.1 seconds (ref 11.4–15.2)

## 2020-08-31 LAB — OSMOLALITY, URINE: Osmolality, Ur: 205 mOsm/kg — ABNORMAL LOW (ref 300–900)

## 2020-08-31 LAB — SODIUM, URINE, RANDOM: Sodium, Ur: 39 mmol/L

## 2020-08-31 LAB — APTT: aPTT: 32 seconds (ref 24–36)

## 2020-08-31 MED ORDER — SODIUM CHLORIDE 3 % IV SOLN
INTRAVENOUS | Status: DC
  Filled 2020-08-31: qty 500

## 2020-08-31 MED ORDER — ENOXAPARIN SODIUM 40 MG/0.4ML ~~LOC~~ SOLN
40.0000 mg | SUBCUTANEOUS | Status: DC
  Administered 2020-09-01 – 2020-09-04 (×5): 40 mg via SUBCUTANEOUS
  Filled 2020-08-31 (×5): qty 0.4

## 2020-08-31 MED ORDER — LACTATED RINGERS IV BOLUS: 1000.0000 mL | Freq: Once | INTRAVENOUS | Status: DC

## 2020-08-31 MED ORDER — POLYETHYLENE GLYCOL 3350 17 G PO PACK: 17.0000 g | PACK | Freq: Every day | ORAL | Status: DC | PRN

## 2020-08-31 MED ORDER — LACTATED RINGERS IV BOLUS
1000.0000 mL | Freq: Once | INTRAVENOUS | Status: AC
  Administered 2020-08-31: 1000 mL via INTRAVENOUS

## 2020-08-31 MED ORDER — LACTATED RINGERS IV BOLUS
500.0000 mL | Freq: Once | INTRAVENOUS | Status: AC
  Administered 2020-08-31: 500 mL via INTRAVENOUS

## 2020-08-31 MED ORDER — ONDANSETRON HCL 4 MG/2ML IJ SOLN
4.0000 mg | Freq: Four times a day (QID) | INTRAMUSCULAR | Status: DC | PRN
  Administered 2020-09-02: 4 mg via INTRAVENOUS
  Filled 2020-08-31: qty 2

## 2020-08-31 MED ORDER — IOHEXOL 300 MG/ML  SOLN
100.0000 mL | Freq: Once | INTRAMUSCULAR | Status: AC | PRN
  Administered 2020-08-31: 100 mL via INTRAVENOUS

## 2020-08-31 MED ORDER — PANTOPRAZOLE SODIUM 40 MG IV SOLR
40.0000 mg | Freq: Every day | INTRAVENOUS | Status: DC
  Administered 2020-09-01 – 2020-09-04 (×5): 40 mg via INTRAVENOUS
  Filled 2020-08-31 (×5): qty 40

## 2020-08-31 MED ORDER — POTASSIUM CHLORIDE CRYS ER 20 MEQ PO TBCR
40.0000 meq | EXTENDED_RELEASE_TABLET | Freq: Once | ORAL | Status: DC
  Filled 2020-08-31: qty 2

## 2020-08-31 MED ORDER — DOCUSATE SODIUM 100 MG PO CAPS: 100.0000 mg | ORAL_CAPSULE | Freq: Two times a day (BID) | ORAL | Status: DC | PRN

## 2020-08-31 MED ORDER — POTASSIUM CHLORIDE 10 MEQ/100ML IV SOLN
10.0000 meq | INTRAVENOUS | Status: AC
  Administered 2020-08-31 – 2020-09-01 (×6): 10 meq via INTRAVENOUS
  Filled 2020-08-31 (×5): qty 100
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
## 2020-08-31 NOTE — ED Notes (Signed)
Pharm at bedside verifying meds

## 2020-08-31 NOTE — ED Triage Notes (Signed)
Pt to ED via [facility] from home for Weakness x 3 days. Started vomiting today. No nausea at this time. Pt has not been eating much. Pt is pale on arrival. Pt does not appear to feel well but is in NAD.    197 CBG 61 HR 98% room air 160/83 20 G LAC
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
## 2020-08-31 NOTE — ED Notes (Signed)
Dr. Tamala Julian made aware of K+ of 2.9 and Na+ of 106.

## 2020-09-01 ENCOUNTER — Encounter: Payer: Self-pay | Admitting: Pulmonary Disease

## 2020-09-01 ENCOUNTER — Inpatient Hospital Stay: Payer: Medicare Other

## 2020-09-01 LAB — COMPREHENSIVE METABOLIC PANEL
ALT: 20 U/L (ref 0–44)
ALT: 24 U/L (ref 0–44)
AST: 27 U/L (ref 15–41)
AST: 31 U/L (ref 15–41)
Albumin: 3.2 g/dL — ABNORMAL LOW (ref 3.5–5.0)
Albumin: 3.4 g/dL — ABNORMAL LOW (ref 3.5–5.0)
Alkaline Phosphatase: 78 U/L (ref 38–126)
Alkaline Phosphatase: 78 U/L (ref 38–126)
Anion gap: 10 (ref 5–15)
Anion gap: 10 (ref 5–15)
BUN: 17 mg/dL (ref 8–23)
BUN: 15 mg/dL (ref 8–23)
CO2: 23 mmol/L (ref 22–32)
CO2: 22 mmol/L (ref 22–32)
Calcium: 8.3 mg/dL — ABNORMAL LOW (ref 8.9–10.3)
Calcium: 8.5 mg/dL — ABNORMAL LOW (ref 8.9–10.3)
Chloride: 82 mmol/L — ABNORMAL LOW (ref 98–111)
Chloride: 83 mmol/L — ABNORMAL LOW (ref 98–111)
Creatinine, Ser: 0.62 mg/dL (ref 0.44–1.00)
Creatinine, Ser: 0.62 mg/dL (ref 0.44–1.00)
GFR, Estimated: >60 mL/min (ref >60)
GFR, Estimated: >60 mL/min (ref >60)
Glucose, Bld: 88 mg/dL (ref 70–99)
Glucose, Bld: 91 mg/dL (ref 70–99)
Potassium: 3.2 mmol/L — ABNORMAL LOW (ref 3.5–5.1)
Potassium: 3.5 mmol/L (ref 3.5–5.1)
Sodium: 115 mmol/L — CL (ref 135–145)
Sodium: 115 mmol/L — CL (ref 135–145)
Total Bilirubin: 1.7 mg/dL — ABNORMAL HIGH (ref 0.3–1.2)
Total Bilirubin: 1.6 mg/dL — ABNORMAL HIGH (ref 0.3–1.2)
Total Protein: 5.8 g/dL — ABNORMAL LOW (ref 6.5–8.1)
Total Protein: 6.1 g/dL — ABNORMAL LOW (ref 6.5–8.1)

## 2020-09-01 LAB — CBC
HCT: 32.7 % — ABNORMAL LOW (ref 36.0–46.0)
Hemoglobin: 12.2 g/dL (ref 12.0–15.0)
MCH: 31.1 pg (ref 26.0–34.0)
MCHC: 37.3 g/dL — ABNORMAL HIGH (ref 30.0–36.0)
MCV: 83.4 fL (ref 80.0–100.0)
Platelets: 193 10*3/uL (ref 150–400)
RBC: 3.92 MIL/uL (ref 3.87–5.11)
RDW: 13.5 % (ref 11.5–15.5)
WBC: 19.3 10*3/uL — ABNORMAL HIGH (ref 4.0–10.5)
nRBC: 0 % (ref 0.0–0.2)

## 2020-09-01 LAB — SODIUM
Sodium: 108 mmol/L — CL (ref 135–145)
Sodium: 112 mmol/L — CL (ref 135–145)
Sodium: 114 mmol/L — CL (ref 135–145)
Sodium: 117 mmol/L — CL (ref 135–145)

## 2020-09-01 LAB — PROCALCITONIN: Procalcitonin: <0.1 ng/mL

## 2020-09-01 LAB — MAGNESIUM: Magnesium: 2.5 mg/dL — ABNORMAL HIGH (ref 1.7–2.4)

## 2020-09-01 LAB — MRSA PCR SCREENING: MRSA by PCR: NEGATIVE

## 2020-09-01 LAB — CBG MONITORING, ED: Glucose-Capillary: 100 mg/dL — ABNORMAL HIGH (ref 70–99)

## 2020-09-01 LAB — POTASSIUM: Potassium: 3.9 mmol/L (ref 3.5–5.1)

## 2020-09-01 LAB — GLUCOSE, CAPILLARY
Glucose-Capillary: 112 mg/dL — ABNORMAL HIGH (ref 70–99)
Glucose-Capillary: 90 mg/dL (ref 70–99)
Glucose-Capillary: 73 mg/dL (ref 70–99)

## 2020-09-01 LAB — PHOSPHORUS
Phosphorus: 3.1 mg/dL (ref 2.5–4.6)
Phosphorus: 2.8 mg/dL (ref 2.5–4.6)

## 2020-09-01 LAB — CORTISOL: Cortisol, Plasma: 19.5 ug/dL

## 2020-09-01 MED ORDER — SODIUM CHLORIDE 1 G PO TABS
1.0000 g | ORAL_TABLET | Freq: Three times a day (TID) | ORAL | Status: DC
  Administered 2020-09-01 – 2020-09-05 (×12): 1 g via ORAL
  Filled 2020-09-01 (×14): qty 1

## 2020-09-01 MED ORDER — POTASSIUM CHLORIDE 10 MEQ/100ML IV SOLN
10.0000 meq | INTRAVENOUS | Status: DC
  Filled 2020-09-01 (×2): qty 100

## 2020-09-01 MED ORDER — POTASSIUM CHLORIDE 10 MEQ/100ML IV SOLN
10.0000 meq | INTRAVENOUS | Status: DC
  Administered 2020-09-01: 10 meq via INTRAVENOUS

## 2020-09-01 MED ORDER — IOHEXOL 300 MG/ML  SOLN
75.0000 mL | Freq: Once | INTRAMUSCULAR | Status: AC | PRN
  Administered 2020-09-01: 75 mL via INTRAVENOUS

## 2020-09-01 MED ORDER — MAGNESIUM SULFATE 2 GM/50ML IV SOLN
2.0000 g | Freq: Once | INTRAVENOUS | Status: AC
  Administered 2020-09-01: 2 g via INTRAVENOUS
  Filled 2020-09-01: qty 50

## 2020-09-01 MED ORDER — POTASSIUM CHLORIDE 20 MEQ PO PACK
20.0000 meq | PACK | ORAL | Status: AC
  Administered 2020-09-01 (×3): 20 meq via ORAL
  Filled 2020-09-01 (×3): qty 1

## 2020-09-01 NOTE — ED Notes (Signed)
Pt resting in stretcher at this time.  Pt and daughter updated on plan of care at this time.  Pt continues to have normal mental status.  When asked if pt is able to walk, she stated "I was able too a few days ago, but it has been getting harder and harder."  Pt states she normally walks with a cane.

## 2020-09-01 NOTE — ED Notes (Signed)
Patient called out to use bed pan. Patient had already had large BM in bed. Cleaned patient, changed linens. Pericare complete.

## 2020-09-01 NOTE — Progress Notes (Signed)
MEDICATION RELATED CONSULT NOTE - INITIAL   Pharmacy Consult for  Hypertonic Saline Indication:   Hyponatremia  Allergies  Allergen Reactions  . Accupril [Quinapril Hcl] Hives and Swelling    SWELLING OF NOSE/FACE  . Sulfa Antibiotics Shortness Of Breath and Swelling  . Clinoril [Sulindac] Other (See Comments)    Unsure of reaction type  . Hydrochlorothiazide Other (See Comments)    Unsure of reaction type  . Spironolactone Diarrhea  . Tessalon [Benzonatate] Other (See Comments)    Unsure of reaction type  . Amoxicillin Rash    Has patient had a PCN reaction causing immediate rash, facial/tongue/throat swelling, SOB or lightheadedness with hypotension: Unknown Has patient had a PCN reaction causing severe rash involving mucus membranes or skin necrosis: Unknown Has patient had a PCN reaction that required hospitalization: No Has patient had a PCN reaction occurring within the last 10 years:Possibly unsure  If all of the above answers are "NO", then may proceed with Cephalosporin use.   . Codeine Sulfate Nausea And Vomiting  . Penicillins Rash    Has patient had a PCN reaction causing immediate rash, facial/tongue/throat swelling, SOB or lightheadedness with hypotension: Unknown Has patient had a PCN reaction causing severe rash involving mucus membranes or skin necrosis: Unknown Has patient had a PCN reaction that required hospitalization: No Has patient had a PCN reaction occurring within the last 10 years:Possibly unsure  If all of the above answers are "NO", then may proceed with Cephalosporin use.    Patient Measurements: Height: 5\' 2"  (157.5 cm) Weight: 62.6 kg (138 lb) IBW/kg (Calculated) : 50.1 Adjusted Body Weight:   Vital Signs: BP: 107/48 (03/20 0600) Pulse Rate: 60 (03/20 0600) Intake/Output from previous day: 03/19 0701 - 03/20 0700 In: 1509.5 [I.V.:359.5; IV Piggyback:1149.9] Out: 2000 [Urine:2000] Intake/Output from this shift: Total I/O In: 1509.5  [I.V.:359.5; IV Piggyback:1149.9] Out: 2000 [Urine:2000]  Labs: Recent Labs    08/31/20 1811 08/31/20 1917 08/31/20 2359  WBC 20.9*  --   --   HGB 12.8  --   --   HCT 34.2*  --   --   PLT 227  --   --   APTT  --  32  --   CREATININE 0.67 0.71  --   MG  --  1.8  --   PHOS  --   --  3.1  ALBUMIN  --  3.7  --   PROT  --  6.8  --   AST  --  33  --   ALT  --  24  --   ALKPHOS  --  90  --   BILITOT  --  1.7*  --   BILIDIR  --  0.3*  --   IBILI  --  1.4*  --    Estimated Creatinine Clearance: 47.2 mL/min (by C-G formula based on SCr of 0.71 mg/dL).   Microbiology: Recent Results (from the past 720 hour(s))  Resp Panel by RT-PCR (Flu A&B, Covid) Nasopharyngeal Swab     Status: None   Collection Time: 08/31/20  8:29 PM   Specimen: Nasopharyngeal Swab; Nasopharyngeal(NP) swabs in vial transport medium  Result Value Ref Range Status   SARS Coronavirus 2 by RT PCR NEGATIVE NEGATIVE Final    Comment: (NOTE) SARS-CoV-2 target nucleic acids are NOT DETECTED.  The SARS-CoV-2 RNA is generally detectable in upper respiratory specimens during the acute phase of infection. The lowest concentration of SARS-CoV-2 viral copies this assay can detect is 138 copies/mL. A negative  result does not preclude SARS-Cov-2 infection and should not be used as the sole basis for treatment or other patient management decisions. A negative result may occur with  improper specimen collection/handling, submission of specimen other than nasopharyngeal swab, presence of viral mutation(s) within the areas targeted by this assay, and inadequate number of viral copies(<138 copies/mL). A negative result must be combined with clinical observations, patient history, and epidemiological information. The expected result is Negative.  Fact Sheet for Patients:  EntrepreneurPulse.com.au  Fact Sheet for Healthcare Providers:  IncredibleEmployment.be  This test is no t yet  approved or cleared by the [place] FDA and  has been authorized for detection and/or diagnosis of SARS-CoV-2 by FDA under an Emergency Use Authorization (EUA). This EUA will remain  in effect (meaning this test can be used) for the duration of the COVID-19 declaration under Section 564(b)(1) of the Act, 21 U.S.C.section 360bbb-3(b)(1), unless the authorization is terminated  or revoked sooner.       Influenza A by PCR NEGATIVE NEGATIVE Final   Influenza B by PCR NEGATIVE NEGATIVE Final    Comment: (NOTE) The Xpert Xpress SARS-CoV-2/FLU/RSV plus assay is intended as an aid in the diagnosis of influenza from Nasopharyngeal swab specimens and should not be used as a sole basis for treatment. Nasal washings and aspirates are unacceptable for Xpert Xpress SARS-CoV-2/FLU/RSV testing.  Fact Sheet for Patients: EntrepreneurPulse.com.au  Fact Sheet for Healthcare Providers: IncredibleEmployment.be  This test is not yet approved or cleared by the [place] FDA and has been authorized for detection and/or diagnosis of SARS-CoV-2 by FDA under an Emergency Use Authorization (EUA). This EUA will remain in effect (meaning this test can be used) for the duration of the COVID-19 declaration under Section 564(b)(1) of the Act, 21 U.S.C. section 360bbb-3(b)(1), unless the authorization is terminated or revoked.  Performed at [facility], [address], [place], [place] [address]     Medical History: Past Medical History:  Diagnosis Date  . Cataracts, bilateral   . CHF (congestive heart failure) ([place]) 02/2019  . Coronary artery disease   . Environmental allergies   . Family history of adverse reaction to anesthesia    sister-nauseated  . GERD (gastroesophageal reflux disease)   . Heart murmur 2019   had since a child. dr. Nehemiah Massed follows d/t getting louder  . Hemorrhoids   . History of chickenpox   . Hypertension   .  Incontinence in female   . Macular degeneration   . Myocardial infarction ([place]) 01/2019   1 stent   . Osteoarthritis   . Skin cancer 12/2017/11-2019   BCC. nose removed from nose last week.  shoulder squamos cell removed previously  . Type O blood, Rh negative 12/2017   patient requests that this be present on her chart    Medications:  (Not in a hospital admission)   Assessment: 85 yo female presents with hyponatremia secondary to overdiuresis with lasix.  Pharmacy consulted to monitor Na levels while on hypertonic saline.   Na levels:  3/19 1811 = 105                     3/19 1917 = 106                     3/19 2029 = 107                     3/19 2359 = 108  3/20 0206 = 112                     3/20 0407 = 114  Goal of Therapy:  Na level :  135 - 145  No increase in serum sodium greater than 8 meq in 24 hrs.   Plan:  Hypertonic saline 3% started on 3/19 @ 2244 at 75 ml/hr. Will monitor Na levels Q4H and notify provider if Na levels increase more than 8 meq in 24 hrs.   3/20:   Spoke with NP @ ~ 0330,  Agreed to d/c hypertonic saline due to increase of 7 mEq over past 8 hrs , also pt is asymptomatic.   Hypertonic saline was d/c'd @ 0358.    Sheliah Fiorillo D 09/01/2020,6:10 AM

## 2020-09-01 NOTE — Consult Note (Signed)
PHARMACY CONSULT NOTE - FOLLOW UP  Pharmacy Consult for Electrolyte Monitoring and Replacement   Recent Labs: Potassium (mmol/L)  Date Value  09/01/2020 3.2 (L)   Magnesium (mg/dL)  Date Value  09/01/2020 2.5 (H)   Calcium (mg/dL)  Date Value  09/01/2020 8.3 (L)   Albumin (g/dL)  Date Value  09/01/2020 3.2 (L)  07/08/2020 4.3   Phosphorus (mg/dL)  Date Value  09/01/2020 2.8   Sodium (mmol/L)  Date Value  09/01/2020 115 (LL)  07/08/2020 143     Assessment: past medical history of CAD, CHF, recently increased her Lasix about a week ago due to some weight gain, GERD, hemorrhoids, HTN, and chronic incontinence who presents for assessment of their 4 days of some nonbloody nonbilious vomiting and general malaise. Sodium is low was started on hyptertonic saline but d/c'ed.   Goal of Therapy:  WNL  Plan:  Medical team ordered KCl 10 mEq IV x 6.  F/u with AM labs.   Oswald Hillock ,PharmD Clinical Pharmacist 09/01/2020 9:21 AM

## 2020-09-01 NOTE — ED Notes (Signed)
Patient unable to tolerate IV potassium. MD informed. Advised to do bedside swallow. Patient was able to swallow without difficulty. MD advised to cancel IV potassium and replace with liquid K-Lyte. Manuela Schwartz in pharmacy notified and will change order to the equivalent dosage.
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
## 2020-09-01 NOTE — ED Notes (Signed)
Teresa Moyer, CCNP notified about Na+ increase to 114.

## 2020-09-01 NOTE — ED Notes (Signed)
Teresa Moyer, CCNP made aware of critical Na+ of 112.  VO to DC hypertonic saline at this time.

## 2020-09-01 NOTE — Progress Notes (Signed)
MEDICATION RELATED CONSULT NOTE - INITIAL   Pharmacy Consult for  Hypertonic Saline Indication:   Hyponatremia  Allergies  Allergen Reactions  . Accupril [Quinapril Hcl] Hives and Swelling    SWELLING OF NOSE/FACE  . Sulfa Antibiotics Shortness Of Breath and Swelling  . Clinoril [Sulindac] Other (See Comments)    Unsure of reaction type  . Hydrochlorothiazide Other (See Comments)    Unsure of reaction type  . Spironolactone Diarrhea  . Tessalon [Benzonatate] Other (See Comments)    Unsure of reaction type  . Amoxicillin Rash    Has patient had a PCN reaction causing immediate rash, facial/tongue/throat swelling, SOB or lightheadedness with hypotension: Unknown Has patient had a PCN reaction causing severe rash involving mucus membranes or skin necrosis: Unknown Has patient had a PCN reaction that required hospitalization: No Has patient had a PCN reaction occurring within the last 10 years:Possibly unsure  If all of the above answers are "NO", then may proceed with Cephalosporin use.   . Codeine Sulfate Nausea And Vomiting  . Penicillins Rash    Has patient had a PCN reaction causing immediate rash, facial/tongue/throat swelling, SOB or lightheadedness with hypotension: Unknown Has patient had a PCN reaction causing severe rash involving mucus membranes or skin necrosis: Unknown Has patient had a PCN reaction that required hospitalization: No Has patient had a PCN reaction occurring within the last 10 years:Possibly unsure  If all of the above answers are "NO", then may proceed with Cephalosporin use.    Patient Measurements: Height: 5\' 2"  (157.5 cm) Weight: 62.6 kg (138 lb) IBW/kg (Calculated) : 50.1 Adjusted Body Weight:   Vital Signs: Temp: 97.8 F (36.6 C) (03/19 1749) Temp Source: Oral (03/19 1749) BP: 125/108 (03/20 0030) Pulse Rate: 67 (03/20 0030) Intake/Output from previous day: 03/19 0701 - 03/20 0700 In: 299.9 [IV Piggyback:299.9] Out: -  Intake/Output from  this shift: Total I/O In: 299.9 [IV Piggyback:299.9] Out: -   Labs: Recent Labs    08/31/20 1811 08/31/20 1917 08/31/20 2359  WBC 20.9*  --   --   HGB 12.8  --   --   HCT 34.2*  --   --   PLT 227  --   --   APTT  --  32  --   CREATININE 0.67 0.71  --   MG  --  1.8  --   PHOS  --   --  3.1  ALBUMIN  --  3.7  --   PROT  --  6.8  --   AST  --  33  --   ALT  --  24  --   ALKPHOS  --  90  --   BILITOT  --  1.7*  --   BILIDIR  --  0.3*  --   IBILI  --  1.4*  --    Estimated Creatinine Clearance: 47.2 mL/min (by C-G formula based on SCr of 0.71 mg/dL).   Microbiology: Recent Results (from the past 720 hour(s))  Resp Panel by RT-PCR (Flu A&B, Covid) Nasopharyngeal Swab     Status: None   Collection Time: 08/31/20  8:29 PM   Specimen: Nasopharyngeal Swab; Nasopharyngeal(NP) swabs in vial transport medium  Result Value Ref Range Status   SARS Coronavirus 2 by RT PCR NEGATIVE NEGATIVE Final    Comment: (NOTE) SARS-CoV-2 target nucleic acids are NOT DETECTED.  The SARS-CoV-2 RNA is generally detectable in upper respiratory specimens during the acute phase of infection. The lowest concentration of SARS-CoV-2 viral  copies this assay can detect is 138 copies/mL. A negative result does not preclude SARS-Cov-2 infection and should not be used as the sole basis for treatment or other patient management decisions. A negative result may occur with  improper specimen collection/handling, submission of specimen other than nasopharyngeal swab, presence of viral mutation(s) within the areas targeted by this assay, and inadequate number of viral copies(<138 copies/mL). A negative result must be combined with clinical observations, patient history, and epidemiological information. The expected result is Negative.  Fact Sheet for Patients:  EntrepreneurPulse.com.au  Fact Sheet for Healthcare Providers:  IncredibleEmployment.be  This test is no t yet  approved or cleared by the [place] FDA and  has been authorized for detection and/or diagnosis of SARS-CoV-2 by FDA under an Emergency Use Authorization (EUA). This EUA will remain  in effect (meaning this test can be used) for the duration of the COVID-19 declaration under Section 564(b)(1) of the Act, 21 U.S.C.section 360bbb-3(b)(1), unless the authorization is terminated  or revoked sooner.       Influenza A by PCR NEGATIVE NEGATIVE Final   Influenza B by PCR NEGATIVE NEGATIVE Final    Comment: (NOTE) The Xpert Xpress SARS-CoV-2/FLU/RSV plus assay is intended as an aid in the diagnosis of influenza from Nasopharyngeal swab specimens and should not be used as a sole basis for treatment. Nasal washings and aspirates are unacceptable for Xpert Xpress SARS-CoV-2/FLU/RSV testing.  Fact Sheet for Patients: EntrepreneurPulse.com.au  Fact Sheet for Healthcare Providers: IncredibleEmployment.be  This test is not yet approved or cleared by the [place] FDA and has been authorized for detection and/or diagnosis of SARS-CoV-2 by FDA under an Emergency Use Authorization (EUA). This EUA will remain in effect (meaning this test can be used) for the duration of the COVID-19 declaration under Section 564(b)(1) of the Act, 21 U.S.C. section 360bbb-3(b)(1), unless the authorization is terminated or revoked.  Performed at [facility], [address], [place], [place] [address]     Medical History: Past Medical History:  Diagnosis Date  . Cataracts, bilateral   . CHF (congestive heart failure) ([place]) 02/2019  . Coronary artery disease   . Environmental allergies   . Family history of adverse reaction to anesthesia    sister-nauseated  . GERD (gastroesophageal reflux disease)   . Heart murmur 2019   had since a child. dr. Nehemiah Massed follows d/t getting louder  . Hemorrhoids   . History of chickenpox   . Hypertension   .  Incontinence in female   . Macular degeneration   . Myocardial infarction ([place]) 01/2019   1 stent   . Osteoarthritis   . Skin cancer 12/2017/11-2019   BCC. nose removed from nose last week.  shoulder squamos cell removed previously  . Type O blood, Rh negative 12/2017   patient requests that this be present on her chart    Medications:  (Not in a hospital admission)   Assessment: 85 yo female presents with hyponatremia secondary to overdiuresis with lasix.  Pharmacy consulted to monitor Na levels while on hypertonic saline.   Na levels:  3/19 1811 = 105                     3/19 1917 = 106                     3/19 2029 = 107  3/19 2359 = 108  Goal of Therapy:  Na level :  135 - 145  No increase in serum sodium greater than 8 meq in 24 hrs.   Plan:  Hypertonic saline 3% started on 3/19 @ 2244 at 75 ml/hr. Will monitor Na levels Q4H and notify provider if Na levels increase more than 8 meq in 24 hrs.    Kishia Shackett D 09/01/2020,1:25 AM

## 2020-09-02 DIAGNOSIS — E871 Hypo-osmolality and hyponatremia: Principal | ICD-10-CM

## 2020-09-02 LAB — COMPREHENSIVE METABOLIC PANEL WITH GFR
ALT: 21 U/L (ref 0–44)
AST: 25 U/L (ref 15–41)
Albumin: 3.1 g/dL — ABNORMAL LOW (ref 3.5–5.0)
Alkaline Phosphatase: 83 U/L (ref 38–126)
Anion gap: 11 (ref 5–15)
BUN: 13 mg/dL (ref 8–23)
CO2: 21 mmol/L — ABNORMAL LOW (ref 22–32)
Calcium: 8.3 mg/dL — ABNORMAL LOW (ref 8.9–10.3)
Chloride: 85 mmol/L — ABNORMAL LOW (ref 98–111)
Creatinine, Ser: 0.71 mg/dL (ref 0.44–1.00)
GFR, Estimated: >60 mL/min
Glucose, Bld: 79 mg/dL (ref 70–99)
Potassium: 3.6 mmol/L (ref 3.5–5.1)
Sodium: 117 mmol/L — CL (ref 135–145)
Total Bilirubin: 1.8 mg/dL — ABNORMAL HIGH (ref 0.3–1.2)
Total Protein: 5.9 g/dL — ABNORMAL LOW (ref 6.5–8.1)

## 2020-09-02 LAB — CBC
HCT: 32.7 % — ABNORMAL LOW (ref 36.0–46.0)
Hemoglobin: 12 g/dL (ref 12.0–15.0)
MCH: 31.6 pg (ref 26.0–34.0)
MCHC: 36.7 g/dL — ABNORMAL HIGH (ref 30.0–36.0)
MCV: 86.1 fL (ref 80.0–100.0)
Platelets: 211 10*3/uL (ref 150–400)
RBC: 3.8 MIL/uL — ABNORMAL LOW (ref 3.87–5.11)
RDW: 14.1 % (ref 11.5–15.5)
WBC: 22.5 10*3/uL — ABNORMAL HIGH (ref 4.0–10.5)
nRBC: 0 % (ref 0.0–0.2)

## 2020-09-02 LAB — GLUCOSE, CAPILLARY
Glucose-Capillary: 76 mg/dL (ref 70–99)
Glucose-Capillary: 75 mg/dL (ref 70–99)
Glucose-Capillary: 214 mg/dL — ABNORMAL HIGH (ref 70–99)
Glucose-Capillary: 152 mg/dL — ABNORMAL HIGH (ref 70–99)
Glucose-Capillary: 135 mg/dL — ABNORMAL HIGH (ref 70–99)

## 2020-09-02 LAB — SODIUM
Sodium: 117 mmol/L — CL (ref 135–145)
Sodium: 118 mmol/L — CL (ref 135–145)
Sodium: 119 mmol/L — CL (ref 135–145)
Sodium: 119 mmol/L — CL (ref 135–145)
Sodium: 120 mmol/L — ABNORMAL LOW (ref 135–145)

## 2020-09-02 LAB — URINE CULTURE: Culture: NO GROWTH

## 2020-09-02 LAB — ACTH: C206 ACTH: 5.8 pg/mL — ABNORMAL LOW (ref 7.2–63.3)

## 2020-09-02 LAB — MAGNESIUM: Magnesium: 2 mg/dL (ref 1.7–2.4)

## 2020-09-02 LAB — PHOSPHORUS: Phosphorus: 3.1 mg/dL (ref 2.5–4.6)

## 2020-09-02 MED ORDER — ATORVASTATIN CALCIUM 20 MG PO TABS
40.0000 mg | ORAL_TABLET | Freq: Every day | ORAL | Status: DC
  Administered 2020-09-02 – 2020-09-04 (×3): 40 mg via ORAL
  Filled 2020-09-02 (×3): qty 2

## 2020-09-02 MED ORDER — POTASSIUM CHLORIDE CRYS ER 20 MEQ PO TBCR
20.0000 meq | EXTENDED_RELEASE_TABLET | Freq: Once | ORAL | Status: AC
  Administered 2020-09-02: 20 meq via ORAL
  Filled 2020-09-02: qty 1

## 2020-09-02 MED ORDER — CHLORHEXIDINE GLUCONATE CLOTH 2 % EX PADS
6.0000 | MEDICATED_PAD | Freq: Every day | CUTANEOUS | Status: DC
  Administered 2020-09-02 – 2020-09-04 (×3): 6 via TOPICAL

## 2020-09-02 MED ORDER — AZELASTINE HCL 0.1 % NA SOLN
1.0000 | Freq: Every day | NASAL | Status: DC
  Administered 2020-09-02 – 2020-09-04 (×3): 1 via NASAL
  Filled 2020-09-02: qty 30

## 2020-09-02 MED ORDER — AZELASTINE HCL 0.1 % NA SOLN: 1.0000 | Freq: Two times a day (BID) | NASAL | Status: DC

## 2020-09-02 MED ORDER — CARBOXYMETHYLCELL-HYPROMELLOSE 0.25-0.3 % OP GEL: 1.0000 "application " | Freq: Every day | OPHTHALMIC | Status: DC

## 2020-09-02 MED ORDER — POLYVINYL ALCOHOL 1.4 % OP SOLN
1.0000 [drp] | Freq: Every day | OPHTHALMIC | Status: DC
  Administered 2020-09-02 – 2020-09-04 (×3): 1 [drp] via OPHTHALMIC
  Filled 2020-09-02: qty 15

## 2020-09-02 MED ORDER — CARVEDILOL 12.5 MG PO TABS
12.5000 mg | ORAL_TABLET | Freq: Two times a day (BID) | ORAL | Status: DC
  Administered 2020-09-03 – 2020-09-05 (×5): 12.5 mg via ORAL
  Filled 2020-09-02 (×5): qty 1

## 2020-09-02 MED ORDER — FLUTICASONE PROPIONATE 50 MCG/ACT NA SUSP: 1.0000 | Freq: Every day | NASAL | Status: DC

## 2020-09-02 MED ORDER — ONDANSETRON 4 MG PO TBDP
4.0000 mg | ORAL_TABLET | Freq: Three times a day (TID) | ORAL | Status: DC | PRN
  Filled 2020-09-02: qty 1

## 2020-09-02 MED ORDER — SIMETHICONE 80 MG PO CHEW
80.0000 mg | CHEWABLE_TABLET | Freq: Four times a day (QID) | ORAL | Status: DC | PRN
  Administered 2020-09-02 – 2020-09-05 (×4): 80 mg via ORAL
  Filled 2020-09-02 (×5): qty 1

## 2020-09-02 MED ORDER — FLUTICASONE PROPIONATE 50 MCG/ACT NA SUSP
1.0000 | Freq: Every day | NASAL | Status: DC
  Administered 2020-09-02 – 2020-09-05 (×4): 1 via NASAL
  Filled 2020-09-02: qty 16

## 2020-09-02 MED ORDER — SODIUM CHLORIDE 0.9 % IV SOLN: INTRAVENOUS | Status: DC
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
## 2020-09-02 NOTE — Consult Note (Addendum)
PHARMACY CONSULT NOTE - FOLLOW UP  Pharmacy Consult for Electrolyte Monitoring and Replacement   Recent Labs: Potassium (mmol/L)  Date Value  09/02/2020 3.6   Magnesium (mg/dL)  Date Value  09/02/2020 2.0   Calcium (mg/dL)  Date Value  09/02/2020 8.3 (L)   Albumin (g/dL)  Date Value  09/02/2020 3.1 (L)  07/08/2020 4.3   Phosphorus (mg/dL)  Date Value  09/02/2020 3.1   Sodium (mmol/L)  Date Value  09/02/2020 119 (LL)  07/08/2020 143   Corrected Ca: 9.02  Assessment: 85 yo F with past medical history of CAD, CHF, recently increased her Lasix about a week ago due to some weight gain, GERD, hemorrhoids, HTN, and chronic incontinence who presents for assessment of their 4 days of some nonbloody nonbilious vomiting and general malaise. Sodium is low was started on hyptertonic saline but d/c'ed.   MIVF: 0.9% NaCl @ 30 mL/hr  Goal of Therapy:  Electrolytes WNL  Plan:  --K 3.6 - Ordered KCl 20 mEq PO x 2 doses --Na 119 (improving) - on scheduled NaCl 1 g PO TID with meals and 0.9% NaCl @ 30 mL/hr --F/u electrolytes with AM labs  Benn Moulder, PharmD Pharmacy Resident  09/02/2020 7:30 AM

## 2020-09-02 NOTE — Care Plan (Signed)
[facility] Transfer Accept Note  85 y.o. F with sCHF EF 45%, HTN, CAD s/p PCI 2020 who presented with constitutional symptoms, found to have K 2.7, Na 105.  Started on hypertonic saline and admitted to ICU.  Na now up to 117, symptoms improving, off 3% saline.  To stepdown now, TRH to assume care in AM tomorrow.

## 2020-09-02 NOTE — Progress Notes (Signed)
0515: Pt c/o nausea after eating 50% of spaghetti for evening meal. Zofran given per PRN order  0545: Nausea has resolved
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
## 2020-09-03 DIAGNOSIS — R112 Nausea with vomiting, unspecified: Secondary | ICD-10-CM

## 2020-09-03 DIAGNOSIS — R531 Weakness: Secondary | ICD-10-CM

## 2020-09-03 LAB — CBC
HCT: 31.2 % — ABNORMAL LOW (ref 36.0–46.0)
Hemoglobin: 11.1 g/dL — ABNORMAL LOW (ref 12.0–15.0)
MCH: 31.1 pg (ref 26.0–34.0)
MCHC: 35.6 g/dL (ref 30.0–36.0)
MCV: 87.4 fL (ref 80.0–100.0)
Platelets: 202 10*3/uL (ref 150–400)
RBC: 3.57 MIL/uL — ABNORMAL LOW (ref 3.87–5.11)
RDW: 14.2 % (ref 11.5–15.5)
WBC: 17.8 10*3/uL — ABNORMAL HIGH (ref 4.0–10.5)
nRBC: 0 % (ref 0.0–0.2)

## 2020-09-03 LAB — COMPREHENSIVE METABOLIC PANEL
ALT: 17 U/L (ref 0–44)
AST: 20 U/L (ref 15–41)
Albumin: 3 g/dL — ABNORMAL LOW (ref 3.5–5.0)
Alkaline Phosphatase: 79 U/L (ref 38–126)
Anion gap: 6 (ref 5–15)
BUN: 13 mg/dL (ref 8–23)
CO2: 22 mmol/L (ref 22–32)
Calcium: 8.3 mg/dL — ABNORMAL LOW (ref 8.9–10.3)
Chloride: 96 mmol/L — ABNORMAL LOW (ref 98–111)
Creatinine, Ser: 0.57 mg/dL (ref 0.44–1.00)
GFR, Estimated: >60 mL/min (ref >60)
Glucose, Bld: 103 mg/dL — ABNORMAL HIGH (ref 70–99)
Potassium: 4 mmol/L (ref 3.5–5.1)
Sodium: 124 mmol/L — ABNORMAL LOW (ref 135–145)
Total Bilirubin: 1.2 mg/dL (ref 0.3–1.2)
Total Protein: 5.6 g/dL — ABNORMAL LOW (ref 6.5–8.1)

## 2020-09-03 LAB — SODIUM
Sodium: 120 mmol/L — ABNORMAL LOW (ref 135–145)
Sodium: 121 mmol/L — ABNORMAL LOW (ref 135–145)
Sodium: 125 mmol/L — ABNORMAL LOW (ref 135–145)

## 2020-09-03 LAB — GLUCOSE, CAPILLARY
Glucose-Capillary: 98 mg/dL (ref 70–99)
Glucose-Capillary: 86 mg/dL (ref 70–99)
Glucose-Capillary: 145 mg/dL — ABNORMAL HIGH (ref 70–99)
Glucose-Capillary: 142 mg/dL — ABNORMAL HIGH (ref 70–99)

## 2020-09-03 LAB — PHOSPHORUS: Phosphorus: 2.3 mg/dL — ABNORMAL LOW (ref 2.5–4.6)

## 2020-09-03 LAB — BASIC METABOLIC PANEL
Anion gap: 8 (ref 5–15)
BUN: 12 mg/dL (ref 8–23)
CO2: 20 mmol/L — ABNORMAL LOW (ref 22–32)
Calcium: 8.1 mg/dL — ABNORMAL LOW (ref 8.9–10.3)
Chloride: 97 mmol/L — ABNORMAL LOW (ref 98–111)
Creatinine, Ser: 0.81 mg/dL (ref 0.44–1.00)
GFR, Estimated: >60 mL/min (ref >60)
Glucose, Bld: 129 mg/dL — ABNORMAL HIGH (ref 70–99)
Potassium: 3.9 mmol/L (ref 3.5–5.1)
Sodium: 125 mmol/L — ABNORMAL LOW (ref 135–145)

## 2020-09-03 LAB — MAGNESIUM: Magnesium: 1.8 mg/dL (ref 1.7–2.4)

## 2020-09-03 LAB — THYROID PANEL WITH TSH
Free Thyroxine Index: 3.1 (ref 1.2–4.9)
T3 Uptake Ratio: 31 % (ref 24–39)
T4, Total: 10.1 ug/dL (ref 4.5–12.0)
TSH: 2.57 u[IU]/mL (ref 0.450–4.500)

## 2020-09-03 MED ORDER — ZOLPIDEM TARTRATE 5 MG PO TABS
5.0000 mg | ORAL_TABLET | Freq: Every evening | ORAL | Status: DC | PRN
  Administered 2020-09-04: 5 mg via ORAL
  Filled 2020-09-03: qty 1

## 2020-09-03 MED ORDER — SACUBITRIL-VALSARTAN 49-51 MG PO TABS
1.0000 | ORAL_TABLET | Freq: Two times a day (BID) | ORAL | Status: DC
  Administered 2020-09-03 – 2020-09-05 (×4): 1 via ORAL
  Filled 2020-09-03 (×6): qty 1

## 2020-09-03 MED ORDER — K PHOS MONO-SOD PHOS DI & MONO 155-852-130 MG PO TABS
500.0000 mg | ORAL_TABLET | Freq: Two times a day (BID) | ORAL | Status: AC
  Administered 2020-09-03 (×2): 500 mg via ORAL
  Filled 2020-09-03 (×2): qty 2

## 2020-09-03 NOTE — Progress Notes (Signed)
PROGRESS NOTE  ICU transfer  Teresa Moyer  GEZ:[id number] DOB: 12/31/1935 DOA: 08/31/2020 PCP: Jerrol Banana., MD   Brief Narrative: Taken from prior notes. 85 year old female presenting to the ED from home where she lives with her husband with complaints of 4 days of nonbloody nonbilious vomiting and general malaise.  She denies fever/chills/productive cough/diarrhea/dysuria/pain.  She does relate that her Lasix diuretic was increased from 40 mg daily to 40 mg twice daily about a week ago, she denies any other changes.  No focal neurological deficits. Found to have multiple lab abnormalities which include severe hypokalemia with potassium at 2.7, chloride of 68, sodium of 105, BNP elevated at 477, troponin at 17 and 18 with flat curve, low serum and urine osmolality and urinary sodium of 39. CT abdomen with mild hydronephrosis and bladder distention and liver cirrhosis. Admitted in ICU for severe hyponatremia, initially requiring hypertonic saline which was discontinued yesterday with improvement in sodium. [facility] to resume care from today.  Subjective: Patient was seen and examined, no new complaint.  Thinks that she is doing better now. Lives with her husband.  Assessment & Plan:   Active Problems:   Acute hyponatremia  Hyponatremia.  Most likely secondary to nausea and vomiting and increasing the dose of Lasix.  Sodium improved to 125.  Has been off from hypertonic saline infusion. TSH and cortisol was within normal limit, ACTH mildly decreased but those levels were drawn in the afternoon.  Liver cirrhosis can be contributory. -Continue with normal saline at 50 mL/h for another day. -Continue to monitor sodium. -PT evaluation  Chronic systolic heart failure.  BNP elevated at 477 on admission, improved from her prior. Appears euvolemic and blood pressure within normal range.  EF of 40 to 45% according to echo done in July 2020. -Keep holding home dose of Lasix for another  day. -Resume home dose of Entresto.  Leukocytosis.  Seems improving.  May be secondary to nausea and vomiting.  No other obvious source of infection.  Procalcitonin remain negative.  Cultures negative. No need for antibiotics. -Continue to monitor  CAD.  No chest pain, mildly elevated troponin with a flat curve. -Continue with home dose of carvedilol and statin.  Objective: Vitals:   09/03/20 1000 09/03/20 1100 09/03/20 1200 09/03/20 1300  BP: (!) 109/47 (!) 105/45 (!) 117/51 (!) 125/99  Pulse: 69 65 66 68  Resp: 17 14 14 16   Temp:      TempSrc:      SpO2: 99% 99% 100% 98%  Weight:      Height:        Intake/Output Summary (Last 24 hours) at 09/03/2020 1333 Last data filed at 09/03/2020 1009 Gross per 24 hour  Intake 1143.86 ml  Output 1150 ml  Net -6.14 ml   Filed Weights   08/31/20 1748 09/01/20 1300 09/02/20 0500  Weight: 62.6 kg 62 kg 62.7 kg    Examination:  General exam: Appears calm and comfortable  Respiratory system: Clear to auscultation. Respiratory effort normal. Cardiovascular system: S1 & S2 heard, RRR.  Positive murmur Gastrointestinal system: Soft, nontender, nondistended, bowel sounds positive. Central nervous system: Alert and oriented. No focal neurological deficits. Extremities: No edema, no cyanosis, pulses intact and symmetrical. Psychiatry: Judgement and insight appear normal. Mood & affect appropriate.    DVT prophylaxis: Lovenox Code Status: Full Family Communication: Discussed with patient, unable to reach husband on phone Disposition Plan:  Status is: Inpatient  Remains inpatient appropriate because:Inpatient level of care appropriate  due to severity of illness   Dispo: The patient is from: Home              Anticipated d/c is to: Home              Patient currently is not medically stable to d/c.   Difficult to place patient No               Level of care: Med-Surg  All the records are reviewed and case discussed with Care  Management/Social Worker. Management plans discussed with the patient, nursing and they are in agreement.  Consultants:   PCCM  Procedures:  Antimicrobials:   Data Reviewed: I have personally reviewed following labs and imaging studies  CBC: Recent Labs  Lab 08/31/20 1811 09/01/20 0602 09/02/20 0301 09/03/20 0404  WBC 20.9* 19.3* 22.5* 17.8*  HGB 12.8 12.2 12.0 11.1*  HCT 34.2* 32.7* 32.7* 31.2*  MCV 82.6 83.4 86.1 87.4  PLT 227 193 211 008   Basic Metabolic Panel: Recent Labs  Lab 08/31/20 1917 08/31/20 2029 08/31/20 2359 09/01/20 0206 09/01/20 0602 09/01/20 1255 09/01/20 1556 09/01/20 2344 09/02/20 0301 09/02/20 0705 09/02/20 1921 09/02/20 2325 09/03/20 0256 09/03/20 0404 09/03/20 1123  NA 106*   < > 108*   < > 115* 115* 117*   < > 117*   < > 120* 120* 121* 124* 125*  K 2.9*  --   --   --  3.2* 3.5 3.9  --  3.6  --   --   --   --  4.0  --   CL 67*  --   --   --  82* 83*  --   --  85*  --   --   --   --  96*  --   CO2 26  --   --   --  23 22  --   --  21*  --   --   --   --  22  --   GLUCOSE 122*  --   --   --  88 91  --   --  79  --   --   --   --  103*  --   BUN 22  --   --   --  17 15  --   --  13  --   --   --   --  13  --   CREATININE 0.71  --   --   --  0.62 0.62  --   --  0.71  --   --   --   --  0.57  --   CALCIUM 8.8*  --   --   --  8.3* 8.5*  --   --  8.3*  --   --   --   --  8.3*  --   MG 1.8  --   --   --  2.5*  --   --   --  2.0  --   --   --   --  1.8  --   PHOS  --   --  3.1  --  2.8  --   --   --  3.1  --   --   --   --  2.3*  --    < > = values in this interval not displayed.   GFR: Estimated Creatinine Clearance: 47.2 mL/min (by C-G formula based on SCr of 0.57 mg/dL).  Liver Function Tests: Recent Labs  Lab 08/31/20 1917 09/01/20 0602 09/01/20 1255 09/02/20 0301 09/03/20 0404  AST 33 27 31 25 20   ALT 24 20 24 21 17   ALKPHOS 90 78 78 83 79  BILITOT 1.7* 1.7* 1.6* 1.8* 1.2  PROT 6.8 5.8* 6.1* 5.9* 5.6*  ALBUMIN 3.7 3.2* 3.4* 3.1*  3.0*   No results for input(s): LIPASE, AMYLASE in the last 168 hours. No results for input(s): AMMONIA in the last 168 hours. Coagulation Profile: Recent Labs  Lab 08/31/20 1917  INR 1.0   Cardiac Enzymes: No results for input(s): CKTOTAL, CKMB, CKMBINDEX, TROPONINI in the last 168 hours. BNP (last 3 results) Recent Labs    02/14/20 1558 02/22/20 1602 05/08/20 1609  PROBNP 23,245* 6,314* 25,432*   HbA1C: No results for input(s): HGBA1C in the last 72 hours. CBG: Recent Labs  Lab 09/02/20 1116 09/02/20 1547 09/02/20 1920 09/03/20 0730 09/03/20 1139  GLUCAP 214* 152* 135* 98 86   Lipid Profile: No results for input(s): CHOL, HDL, LDLCALC, TRIG, CHOLHDL, LDLDIRECT in the last 72 hours. Thyroid Function Tests: Recent Labs    09/01/20 1556  TSH 2.570  T4TOTAL 10.1   Anemia Panel: No results for input(s): VITAMINB12, FOLATE, FERRITIN, TIBC, IRON, RETICCTPCT in the last 72 hours. Sepsis Labs: Recent Labs  Lab 08/31/20 1932 08/31/20 2359  PROCALCITON  --  <0.10  LATICACIDVEN 1.1  --     Recent Results (from the past 240 hour(s))  Blood culture (routine single)     Status: None (Preliminary result)   Collection Time: 08/31/20  7:32 PM   Specimen: BLOOD  Result Value Ref Range Status   Specimen Description BLOOD RIGHT ANTECUBITAL  Final   Special Requests   Final    BOTTLES DRAWN AEROBIC AND ANAEROBIC Blood Culture adequate volume   Culture   Final    NO GROWTH 3 DAYS Performed at [facility], [address] [place], [place] [address]    Report Status PENDING  Incomplete  Resp Panel by RT-PCR (Flu A&B, Covid) Nasopharyngeal Swab     Status: None   Collection Time: 08/31/20  8:29 PM   Specimen: Nasopharyngeal Swab; Nasopharyngeal(NP) swabs in vial transport medium  Result Value Ref Range Status   SARS Coronavirus 2 by RT PCR NEGATIVE NEGATIVE Final    Comment: (NOTE) SARS-CoV-2 target nucleic acids are NOT DETECTED.  The SARS-CoV-2 RNA is  generally detectable in upper respiratory specimens during the acute phase of infection. The lowest concentration of SARS-CoV-2 viral copies this assay can detect is 138 copies/mL. A negative result does not preclude SARS-Cov-2 infection and should not be used as the sole basis for treatment or other patient management decisions. A negative result may occur with  improper specimen collection/handling, submission of specimen other than nasopharyngeal swab, presence of viral mutation(s) within the areas targeted by this assay, and inadequate number of viral copies(<138 copies/mL). A negative result must be combined with clinical observations, patient history, and epidemiological information. The expected result is Negative.  Fact Sheet for Patients:  EntrepreneurPulse.com.au  Fact Sheet for Healthcare Providers:  IncredibleEmployment.be  This test is no t yet approved or cleared by the [place] FDA and  has been authorized for detection and/or diagnosis of SARS-CoV-2 by FDA under an Emergency Use Authorization (EUA). This EUA will remain  in effect (meaning this test can be used) for the duration of the COVID-19 declaration under Section 564(b)(1) of the Act, 21 U.S.C.section 360bbb-3(b)(1), unless the authorization is  terminated  or revoked sooner.       Influenza A by PCR NEGATIVE NEGATIVE Final   Influenza B by PCR NEGATIVE NEGATIVE Final    Comment: (NOTE) The Xpert Xpress SARS-CoV-2/FLU/RSV plus assay is intended as an aid in the diagnosis of influenza from Nasopharyngeal swab specimens and should not be used as a sole basis for treatment. Nasal washings and aspirates are unacceptable for Xpert Xpress SARS-CoV-2/FLU/RSV testing.  Fact Sheet for Patients: EntrepreneurPulse.com.au  Fact Sheet for Healthcare Providers: IncredibleEmployment.be  This test is not yet approved or cleared by the [place] FDA and has been authorized for detection and/or diagnosis of SARS-CoV-2 by FDA under an Emergency Use Authorization (EUA). This EUA will remain in effect (meaning this test can be used) for the duration of the COVID-19 declaration under Section 564(b)(1) of the Act, 21 U.S.C. section 360bbb-3(b)(1), unless the authorization is terminated or revoked.  Performed at [facility], [address] [place], [place] [address]   Urine culture     Status: None   Collection Time: 08/31/20 11:09 PM   Specimen: In/Out Cath Urine  Result Value Ref Range Status   Specimen Description   Final    IN/OUT CATH URINE Performed at [facility] [address] [place], [place] [address]    Special Requests   Final    NONE Performed at [facility], [address], [place], [place] [address]    Culture   Final    NO GROWTH Performed at [facility], [place] [address], [place], [place] [address]    Report Status 09/02/2020 FINAL  Final  MRSA PCR Screening     Status: None   Collection Time: 09/01/20 12:58 PM   Specimen: Nasal Mucosa; Nasopharyngeal  Result Value Ref Range Status   MRSA by PCR NEGATIVE NEGATIVE Final    Comment:        The GeneXpert MRSA Assay (FDA approved for NASAL specimens only), is one component of a comprehensive MRSA colonization surveillance program. It is not intended to diagnose MRSA infection nor to guide or monitor treatment for MRSA infections. Performed at [facility], [address], [place], [place] [address]      Radiology Studies: CT CHEST W CONTRAST  Result Date: 09/01/2020 CLINICAL DATA:  85 year old female presenting to the ED from home where she lives with her husband with complaints of 4 days of nonbloody nonbilious vomiting and general malaise. EXAM: CT CHEST WITH CONTRAST TECHNIQUE: Multidetector CT imaging of the chest was performed during intravenous contrast administration. CONTRAST:  38mL  OMNIPAQUE IOHEXOL 300 MG/ML  SOLN COMPARISON:  None. FINDINGS: Cardiovascular: Mildly enlarged heart size. No pericardial effusion. Normal caliber of the thoracic aorta and main pulmonary artery. Aortic atherosclerosis. Coronary artery calcification. While not specifically tailored for the evaluation of pulmonary emboli there are no filling defects in the visualized pulmonary arteries. Mediastinum/Nodes: No enlarged mediastinal, hilar, or axillary lymph nodes. Thyroid gland, trachea, and esophagus demonstrate no significant findings. Lungs/Pleura: Central airways are patent. There is scattered mild atelectasis and scarring. No focal consolidation. No pneumothorax or pleural effusion. No overt edema. Upper Abdomen: Cyst noted in the hepatic dome. There is intra and extrahepatic biliary duct dilation as described on recent CT. Musculoskeletal: Chronic compression fracture in the midthoracic spine. No new acute osseous abnormality. IMPRESSION: 1. No acute intrathoracic process to explain the patient's symptoms. 2.  Mild cardiomegaly. Aortic Atherosclerosis (ICD10-I70.0). Electronically Signed   By: Jac Canavan.D.  On: 09/01/2020 14:21    Scheduled Meds: . atorvastatin  40 mg Oral QHS  . azelastine  1 spray Each Nare QHS  . carvedilol  12.5 mg Oral BID WC  . Chlorhexidine Gluconate Cloth  6 each Topical Daily  . enoxaparin (LOVENOX) injection  40 mg Subcutaneous Q24H  . fluticasone  1 spray Each Nare Daily  . pantoprazole (PROTONIX) IV  40 mg Intravenous QHS  . phosphorus  500 mg Oral BID  . polyvinyl alcohol  1 drop Both Eyes QHS  . sodium chloride  1 g Oral TID WC   Continuous Infusions: . sodium chloride 50 mL/hr at 09/03/20 1320     LOS: 3 days   Time spent: 45 minutes. More than 50% of the time was spent in counseling/coordination of care  Lorella Nimrod, MD [facility]  If 7PM-7AM, please contact night-coverage [email or web address]  09/03/2020, 1:33 PM   This record has been  created using Systems analyst. Errors have been sought and corrected,but may not always be located. Such creation errors do not reflect on the standard of care.

## 2020-09-03 NOTE — Consult Note (Addendum)
PHARMACY CONSULT NOTE - FOLLOW UP  Pharmacy Consult for Electrolyte Monitoring and Replacement   Recent Labs: Potassium (mmol/L)  Date Value  09/03/2020 4.0   Magnesium (mg/dL)  Date Value  09/03/2020 1.8   Calcium (mg/dL)  Date Value  09/03/2020 8.3 (L)   Albumin (g/dL)  Date Value  09/03/2020 3.0 (L)  07/08/2020 4.3   Phosphorus (mg/dL)  Date Value  09/03/2020 2.3 (L)   Sodium (mmol/L)  Date Value  09/03/2020 124 (L)  07/08/2020 143   Corrected Ca: 9.1  Assessment: 85 yo F with past medical history of CAD, CHF, recently increased her Lasix about a week ago due to some weight gain, GERD, hemorrhoids, HTN, and chronic incontinence who presents for assessment of their 4 days of some nonbloody nonbilious vomiting and general malaise. Sodium is low was started on hyptertonic saline but d/c'ed.   MIVF: 0.9% NaCl @ 50 mL/hr  Goal of Therapy:  Electrolytes WNL  Plan:  --Na 125 (improving) - Continue NaCl 1 g PO TID with meals and 0.9% NaCl @ 50 mL/hr --Phos 2.3 - ordered K Phos Neutral 500 mg x 2 doses  --F/u electrolytes with AM labs  Benn Moulder, PharmD Pharmacy Resident  09/03/2020 7:07 AM

## 2020-09-03 NOTE — Evaluation (Signed)
Physical Therapy Evaluation Patient Details Name: Teresa Moyer MRN: [id number] DOB: [birth year] Today's Date: 09/03/2020   History of Present Illness  Pt is an 85 year old female presenting to the ED from home where she lives with her husband with complaints of 4 days of nonbloody nonbilious vomiting and general malaise. Pt work up showed severe hypokalemia and hyponatremia. Na+ 125 today, K+ 4.0.    Clinical Impression  Pt alert, agreeable to PT Ox4. Denied pain. Pt reported at baseline she is modI/I for ADLs, mostly using SPC for ambulation as needed. Per pt and family, husband recently discharged from hospital and currently isn't able to provide much assist for patient. Denied any falls in the last 6 months.  The patient needed minA for Supine to sit with HOB elevated. Fair sitting balance noted. Sit <> stand with RW from EOB and from recliner, minA due to posterior lean noted. Able to correct with assist and time. Initially able to ambulate to chair in room ~92ft, and after seated rest break and close chair follow, ambulated an additional 22ft with CGA. Pt exhibited significantly decreased gait velocity, shuffled step, and fatigued quickly. Pt endorsed that this is very different from her normal ambulation. Returned to room with all needs in reach.  Overall the patient demonstrated deficits (see "PT Problem List") that impede the patient's functional abilities, safety, and mobility and would benefit from skilled PT intervention. Recommendation is SNF due to acute decline in functional mobility as well as decreased caregiver support.      Follow Up Recommendations SNF    Equipment Recommendations  Rolling walker with 5" wheels    Recommendations for Other Services OT consult     Precautions / Restrictions Precautions Precautions: Fall Restrictions Weight Bearing Restrictions: No      Mobility  Bed Mobility Overal bed mobility: Needs Assistance Bed Mobility: Supine to Sit      Supine to sit: Min assist;HOB elevated     General bed mobility comments: minA for weight shift to EOB    Transfers Overall transfer level: Needs assistance Equipment used: Rolling walker (2 wheeled) Transfers: Sit to/from Stand Sit to Stand: Min assist         General transfer comment: minA due to posterior lean once up in standing  Ambulation/Gait Ambulation/Gait assistance: Min guard Gait Distance (Feet): 35 Feet Assistive device: Rolling walker (2 wheeled)       General Gait Details: close chair follow for safety. Very slow velocity, shuffled gait noted. fatigued quickly  Stairs            Wheelchair Mobility    Modified Rankin (Stroke Patients Only)       Balance Overall balance assessment: Needs assistance Sitting-balance support: Feet supported Sitting balance-Leahy Scale: Fair       Standing balance-Leahy Scale: Poor Standing balance comment: reliant on UE support                             Pertinent Vitals/Pain Pain Assessment: No/denies pain    Home Living Family/patient expects to be discharged to:: Private residence Living Arrangements: Spouse/significant other Available Help at Discharge: Family (husband with recent hospital admission, most likely not able to provide much physical assist) Type of Home: House Home Access: Stairs to enter Entrance Stairs-Rails: Psychiatric nurse of Steps: 7 Home Layout: One level Home Equipment: Cane - single point;Walker - 4 wheels;Bedside commode;Tub bench;Grab bars - tub/shower;Walker - 2 wheels (RW that husband is  currently using) Additional Comments: can use handle on tub bench when performing toilet transfers    Prior Function Level of Independence: Needs assistance   Gait / Transfers Assistance Needed: pt reported using SPC more often the RW recently  ADL's / Homemaking Assistance Needed: Denies needing assistance for ADLs reporting independence  Comments: Pt  reports having macular degeneration and has difficulty seeing certain things and discerning color variances.     Hand Dominance   Dominant Hand: Right    Extremity/Trunk Assessment   Upper Extremity Assessment Upper Extremity Assessment:  (grossly 4-/5)    Lower Extremity Assessment Lower Extremity Assessment:  (grossly 4-/5, hip flexion bilaterall 3+/5)    Cervical / Trunk Assessment Cervical / Trunk Assessment: Kyphotic  Communication   Communication: No difficulties  Cognition Arousal/Alertness: Awake/alert Behavior During Therapy: WFL for tasks assessed/performed Overall Cognitive Status: Within Functional Limits for tasks assessed                                        General Comments      Exercises     Assessment/Plan    PT Assessment Patient needs continued PT services  PT Problem List Decreased strength;Decreased mobility;Decreased activity tolerance;Decreased balance;Decreased knowledge of use of DME       PT Treatment Interventions DME instruction;Therapeutic exercise;Gait training;Balance training;Stair training;Neuromuscular re-education;Functional mobility training;Patient/family education;Therapeutic activities    PT Goals (Current goals can be found in the Care Plan section)  Acute Rehab PT Goals Patient Stated Goal: to go home PT Goal Formulation: With patient Time For Goal Achievement: 09/17/20 Potential to Achieve Goals: Good    Frequency Min 2X/week   Barriers to discharge Decreased caregiver support;Inaccessible home environment      Co-evaluation               AM-PAC PT "6 Clicks" Mobility  Outcome Measure Help needed turning from your back to your side while in a flat bed without using bedrails?: A Little Help needed moving from lying on your back to sitting on the side of a flat bed without using bedrails?: A Little Help needed moving to and from a bed to a chair (including a wheelchair)?: A Little Help needed  standing up from a chair using your arms (e.g., wheelchair or bedside chair)?: A Little Help needed to walk in hospital room?: A Lot Help needed climbing 3-5 steps with a railing? : Total 6 Click Score: 15    End of Session Equipment Utilized During Treatment: Gait belt Activity Tolerance: Patient limited by fatigue Patient left: in chair;with call bell/phone within reach;with family/visitor present Nurse Communication: Mobility status PT Visit Diagnosis: Other abnormalities of gait and mobility (R26.89);Muscle weakness (generalized) (M62.81);Difficulty in walking, not elsewhere classified (R26.2)    Time: 0254-2706 PT Time Calculation (min) (ACUTE ONLY): 43 min   Charges:   PT Evaluation $PT Eval Low Complexity: 1 Low PT Treatments $Therapeutic Exercise: 23-37 mins       Lieutenant Diego PT, DPT 3:25 PM,09/03/20

## 2020-09-04 LAB — BASIC METABOLIC PANEL
Anion gap: 5 (ref 5–15)
Anion gap: 6 (ref 5–15)
BUN: 11 mg/dL (ref 8–23)
BUN: 10 mg/dL (ref 8–23)
CO2: 22 mmol/L (ref 22–32)
CO2: 21 mmol/L — ABNORMAL LOW (ref 22–32)
Calcium: 8 mg/dL — ABNORMAL LOW (ref 8.9–10.3)
Calcium: 8.1 mg/dL — ABNORMAL LOW (ref 8.9–10.3)
Chloride: 100 mmol/L (ref 98–111)
Chloride: 99 mmol/L (ref 98–111)
Creatinine, Ser: 0.5 mg/dL (ref 0.44–1.00)
Creatinine, Ser: 0.47 mg/dL (ref 0.44–1.00)
GFR, Estimated: >60 mL/min (ref >60)
GFR, Estimated: >60 mL/min (ref >60)
Glucose, Bld: 122 mg/dL — ABNORMAL HIGH (ref 70–99)
Glucose, Bld: 126 mg/dL — ABNORMAL HIGH (ref 70–99)
Potassium: 3.7 mmol/L (ref 3.5–5.1)
Potassium: 4 mmol/L (ref 3.5–5.1)
Sodium: 127 mmol/L — ABNORMAL LOW (ref 135–145)
Sodium: 126 mmol/L — ABNORMAL LOW (ref 135–145)

## 2020-09-04 LAB — CBC
HCT: 29.9 % — ABNORMAL LOW (ref 36.0–46.0)
Hemoglobin: 10.7 g/dL — ABNORMAL LOW (ref 12.0–15.0)
MCH: 31.4 pg (ref 26.0–34.0)
MCHC: 35.8 g/dL (ref 30.0–36.0)
MCV: 87.7 fL (ref 80.0–100.0)
Platelets: 203 10*3/uL (ref 150–400)
RBC: 3.41 MIL/uL — ABNORMAL LOW (ref 3.87–5.11)
RDW: 14.6 % (ref 11.5–15.5)
WBC: 14.8 10*3/uL — ABNORMAL HIGH (ref 4.0–10.5)
nRBC: 0 % (ref 0.0–0.2)

## 2020-09-04 LAB — GLUCOSE, CAPILLARY
Glucose-Capillary: 109 mg/dL — ABNORMAL HIGH (ref 70–99)
Glucose-Capillary: 114 mg/dL — ABNORMAL HIGH (ref 70–99)
Glucose-Capillary: 128 mg/dL — ABNORMAL HIGH (ref 70–99)
Glucose-Capillary: 126 mg/dL — ABNORMAL HIGH (ref 70–99)

## 2020-09-04 LAB — MAGNESIUM: Magnesium: 1.6 mg/dL — ABNORMAL LOW (ref 1.7–2.4)

## 2020-09-04 LAB — PHOSPHORUS: Phosphorus: 2.6 mg/dL (ref 2.5–4.6)

## 2020-09-04 MED ORDER — MAGNESIUM SULFATE 2 GM/50ML IV SOLN
2.0000 g | Freq: Once | INTRAVENOUS | Status: AC
  Administered 2020-09-04: 2 g via INTRAVENOUS
  Filled 2020-09-04: qty 50

## 2020-09-04 NOTE — Progress Notes (Signed)
PROGRESS NOTE  ICU transfer  Teresa Moyer  EUM:[id number] DOB: 09-04-1935 DOA: 08/31/2020 PCP: Jerrol Banana., MD   Brief Narrative: Taken from prior notes. 85 year old female presenting to the ED from home where she lives with her husband with complaints of 4 days of nonbloody nonbilious vomiting and general malaise.  She denies fever/chills/productive cough/diarrhea/dysuria/pain.  She does relate that her Lasix diuretic was increased from 40 mg daily to 40 mg twice daily about a week ago, she denies any other changes.  No focal neurological deficits. Found to have multiple lab abnormalities which include severe hypokalemia with potassium at 2.7, chloride of 68, sodium of 105, BNP elevated at 477, troponin at 17 and 18 with flat curve, low serum and urine osmolality and urinary sodium of 39. CT abdomen with mild hydronephrosis and bladder distention and liver cirrhosis. Admitted in ICU for severe hyponatremia, initially requiring hypertonic saline which was discontinued yesterday with improvement in sodium. [facility] to resume care from 09/03/20. PT is recommending SNF but patient wants to go home with home health services which were ordered today.  Subjective: Patient was unable to sleep last night so feeling very lethargic when seen today. She does not want to go to SNF, stating that she will go home with home health services.  Assessment & Plan:   Active Problems:   Hyponatremia   Acute hyponatremia   Nausea and vomiting   Weakness  Hyponatremia.  Most likely secondary to nausea and vomiting and increasing the dose of Lasix.  Sodium improved to 127 today.  Has been off from hypertonic saline infusion. TSH and cortisol was within normal limit, ACTH mildly decreased but those levels were drawn in the afternoon.  Liver cirrhosis can be contributory. -Encourage p.o. hydration and discontinue IV fluid as patient also has low EF. -Continue to monitor sodium. -PT  evaluation-recommending SNF but patient wants to go home with home health services.  Chronic systolic heart failure.  BNP elevated at 477 on admission, improved from her prior. Appears euvolemic and blood pressure within normal range.  EF of 40 to 45% according to echo done in July 2020. -Keep holding home dose of Lasix for another day. -Resume home dose of Entresto.  Leukocytosis.  Seems improving.  May be secondary to nausea and vomiting.  No other obvious source of infection.  Procalcitonin remain negative.  Cultures negative. No need for antibiotics. -Continue to monitor  CAD.  No chest pain, mildly elevated troponin with a flat curve. -Continue with home dose of carvedilol and statin.  Objective: Vitals:   09/04/20 1100 09/04/20 1200 09/04/20 1400 09/04/20 1500  BP:  (!) 146/55 (!) 151/66   Pulse: 68 62 65 66  Resp: 16 14 17 16   Temp:      TempSrc:      SpO2: 97% 98% 97% 98%  Weight:      Height:        Intake/Output Summary (Last 24 hours) at 09/04/2020 1548 Last data filed at 09/04/2020 1009 Gross per 24 hour  Intake 1461.82 ml  Output 950 ml  Net 511.82 ml   Filed Weights   09/01/20 1300 09/02/20 0500 09/04/20 0500  Weight: 62 kg 62.7 kg 64.5 kg    Examination:  General.  Elderly lady who appears tired, in no acute distress. Pulmonary.  Lungs clear bilaterally, normal respiratory effort. CV.  Regular rate and rhythm, no JVD, rub or murmur. Abdomen.  Soft, nontender, nondistended, BS positive. CNS.  Alert and oriented x3.  No focal neurologic deficit. Extremities.  No edema, no cyanosis, pulses intact and symmetrical. Psychiatry.  Judgment and insight appears normal.   DVT prophylaxis: Lovenox Code Status: Full Family Communication: Discussed with patient. Disposition Plan:  Status is: Inpatient  Remains inpatient appropriate because:Inpatient level of care appropriate due to severity of illness   Dispo: The patient is from: Home               Anticipated d/c is to: Home              Patient currently is not medically stable to d/c.   Difficult to place patient No               Level of care: Med-Surg  All the records are reviewed and case discussed with Care Management/Social Worker. Management plans discussed with the patient, nursing and they are in agreement.  Consultants:   PCCM  Procedures:  Antimicrobials:   Data Reviewed: I have personally reviewed following labs and imaging studies  CBC: Recent Labs  Lab 08/31/20 1811 09/01/20 0602 09/02/20 0301 09/03/20 0404 09/04/20 0422  WBC 20.9* 19.3* 22.5* 17.8* 14.8*  HGB 12.8 12.2 12.0 11.1* 10.7*  HCT 34.2* 32.7* 32.7* 31.2* 29.9*  MCV 82.6 83.4 86.1 87.4 87.7  PLT 227 193 211 202 518   Basic Metabolic Panel: Recent Labs  Lab 08/31/20 1917 08/31/20 2029 08/31/20 2359 09/01/20 0206 09/01/20 0602 09/01/20 1255 09/01/20 1556 09/01/20 2344 09/02/20 0301 09/02/20 0705 09/03/20 0256 09/03/20 0404 09/03/20 1123 09/03/20 1658 09/04/20 0422  NA 106*   < > 108*   < > 115* 115* 117*   < > 117*   < > 121* 124* 125* 125* 127*  K 2.9*  --   --   --  3.2* 3.5 3.9  --  3.6  --   --  4.0  --  3.9 3.7  CL 67*  --   --   --  82* 83*  --   --  85*  --   --  96*  --  97* 100  CO2 26  --   --   --  23 22  --   --  21*  --   --  22  --  20* 22  GLUCOSE 122*  --   --   --  88 91  --   --  79  --   --  103*  --  129* 122*  BUN 22  --   --   --  17 15  --   --  13  --   --  13  --  12 11  CREATININE 0.71  --   --   --  0.62 0.62  --   --  0.71  --   --  0.57  --  0.81 0.50  CALCIUM 8.8*  --   --   --  8.3* 8.5*  --   --  8.3*  --   --  8.3*  --  8.1* 8.0*  MG 1.8  --   --   --  2.5*  --   --   --  2.0  --   --  1.8  --   --  1.6*  PHOS  --   --  3.1  --  2.8  --   --   --  3.1  --   --  2.3*  --   --  2.6   < > =  values in this interval not displayed.   GFR: Estimated Creatinine Clearance: 47.8 mL/min (by C-G formula based on SCr of 0.5 mg/dL). Liver Function  Tests: Recent Labs  Lab 08/31/20 1917 09/01/20 0602 09/01/20 1255 09/02/20 0301 09/03/20 0404  AST 33 27 31 25 20   ALT 24 20 24 21 17   ALKPHOS 90 78 78 83 79  BILITOT 1.7* 1.7* 1.6* 1.8* 1.2  PROT 6.8 5.8* 6.1* 5.9* 5.6*  ALBUMIN 3.7 3.2* 3.4* 3.1* 3.0*   No results for input(s): LIPASE, AMYLASE in the last 168 hours. No results for input(s): AMMONIA in the last 168 hours. Coagulation Profile: Recent Labs  Lab 08/31/20 1917  INR 1.0   Cardiac Enzymes: No results for input(s): CKTOTAL, CKMB, CKMBINDEX, TROPONINI in the last 168 hours. BNP (last 3 results) Recent Labs    02/14/20 1558 02/22/20 1602 05/08/20 1609  PROBNP 23,245* 6,314* 25,432*   HbA1C: No results for input(s): HGBA1C in the last 72 hours. CBG: Recent Labs  Lab 09/03/20 1552 09/03/20 2104 09/04/20 0723 09/04/20 1119 09/04/20 1515  GLUCAP 145* 142* 109* 114* 128*   Lipid Profile: No results for input(s): CHOL, HDL, LDLCALC, TRIG, CHOLHDL, LDLDIRECT in the last 72 hours. Thyroid Function Tests: Recent Labs    09/01/20 1556  TSH 2.570  T4TOTAL 10.1   Anemia Panel: No results for input(s): VITAMINB12, FOLATE, FERRITIN, TIBC, IRON, RETICCTPCT in the last 72 hours. Sepsis Labs: Recent Labs  Lab 08/31/20 1932 08/31/20 2359  PROCALCITON  --  <0.10  LATICACIDVEN 1.1  --     Recent Results (from the past 240 hour(s))  Blood culture (routine single)     Status: None (Preliminary result)   Collection Time: 08/31/20  7:32 PM   Specimen: BLOOD  Result Value Ref Range Status   Specimen Description BLOOD RIGHT ANTECUBITAL  Final   Special Requests   Final    BOTTLES DRAWN AEROBIC AND ANAEROBIC Blood Culture adequate volume   Culture   Final    NO GROWTH 4 DAYS Performed at [facility], [address] [place], [place] [address]    Report Status PENDING  Incomplete  Resp Panel by RT-PCR (Flu A&B, Covid) Nasopharyngeal Swab     Status: None   Collection Time: 08/31/20  8:29 PM    Specimen: Nasopharyngeal Swab; Nasopharyngeal(NP) swabs in vial transport medium  Result Value Ref Range Status   SARS Coronavirus 2 by RT PCR NEGATIVE NEGATIVE Final    Comment: (NOTE) SARS-CoV-2 target nucleic acids are NOT DETECTED.  The SARS-CoV-2 RNA is generally detectable in upper respiratory specimens during the acute phase of infection. The lowest concentration of SARS-CoV-2 viral copies this assay can detect is 138 copies/mL. A negative result does not preclude SARS-Cov-2 infection and should not be used as the sole basis for treatment or other patient management decisions. A negative result may occur with  improper specimen collection/handling, submission of specimen other than nasopharyngeal swab, presence of viral mutation(s) within the areas targeted by this assay, and inadequate number of viral copies(<138 copies/mL). A negative result must be combined with clinical observations, patient history, and epidemiological information. The expected result is Negative.  Fact Sheet for Patients:  EntrepreneurPulse.com.au  Fact Sheet for Healthcare Providers:  IncredibleEmployment.be  This test is no t yet approved or cleared by the [place] FDA and  has been authorized for detection and/or diagnosis of SARS-CoV-2 by FDA under an Emergency Use Authorization (EUA). This EUA will remain  in effect (meaning this test  can be used) for the duration of the COVID-19 declaration under Section 564(b)(1) of the Act, 21 U.S.C.section 360bbb-3(b)(1), unless the authorization is terminated  or revoked sooner.       Influenza A by PCR NEGATIVE NEGATIVE Final   Influenza B by PCR NEGATIVE NEGATIVE Final    Comment: (NOTE) The Xpert Xpress SARS-CoV-2/FLU/RSV plus assay is intended as an aid in the diagnosis of influenza from Nasopharyngeal swab specimens and should not be used as a sole basis for treatment. Nasal washings and aspirates are  unacceptable for Xpert Xpress SARS-CoV-2/FLU/RSV testing.  Fact Sheet for Patients: EntrepreneurPulse.com.au  Fact Sheet for Healthcare Providers: IncredibleEmployment.be  This test is not yet approved or cleared by the [place] FDA and has been authorized for detection and/or diagnosis of SARS-CoV-2 by FDA under an Emergency Use Authorization (EUA). This EUA will remain in effect (meaning this test can be used) for the duration of the COVID-19 declaration under Section 564(b)(1) of the Act, 21 U.S.C. section 360bbb-3(b)(1), unless the authorization is terminated or revoked.  Performed at [facility], [address], [place], [place] [address]   Urine culture     Status: None   Collection Time: 08/31/20 11:09 PM   Specimen: In/Out Cath Urine  Result Value Ref Range Status   Specimen Description   Final    IN/OUT CATH URINE Performed at [facility], [address], [place], [place] [address]    Special Requests   Final    NONE Performed at [facility], [address], [place], [place] [address]    Culture   Final    NO GROWTH Performed at [facility], [place] [address] [place], [place] [address]    Report Status 09/02/2020 FINAL  Final  MRSA PCR Screening     Status: None   Collection Time: 09/01/20 12:58 PM   Specimen: Nasal Mucosa; Nasopharyngeal  Result Value Ref Range Status   MRSA by PCR NEGATIVE NEGATIVE Final    Comment:        The GeneXpert MRSA Assay (FDA approved for NASAL specimens only), is one component of a comprehensive MRSA colonization surveillance program. It is not intended to diagnose MRSA infection nor to guide or monitor treatment for MRSA infections. Performed at [facility], [address], [place], [place] [address]      Radiology Studies: No results found.  Scheduled Meds: . atorvastatin  40 mg Oral QHS  . azelastine  1 spray Each Nare QHS  .  carvedilol  12.5 mg Oral BID WC  . Chlorhexidine Gluconate Cloth  6 each Topical Daily  . enoxaparin (LOVENOX) injection  40 mg Subcutaneous Q24H  . fluticasone  1 spray Each Nare Daily  . pantoprazole (PROTONIX) IV  40 mg Intravenous QHS  . polyvinyl alcohol  1 drop Both Eyes QHS  . sacubitril-valsartan  1 tablet Oral BID  . sodium chloride  1 g Oral TID WC   Continuous Infusions: . sodium chloride 50 mL/hr at 09/04/20 0600     LOS: 4 days   Time spent: 35 minutes. More than 50% of the time was spent in counseling/coordination of care  Lorella Nimrod, MD [facility]  If 7PM-7AM, please contact night-coverage [email or web address]  09/04/2020, 3:48 PM   This record has been created using Systems analyst. Errors have been sought and corrected,but may not always be located. Such creation errors do not reflect on the standard of care.

## 2020-09-04 NOTE — Consult Note (Signed)
PHARMACY CONSULT NOTE - FOLLOW UP  Pharmacy Consult for Electrolyte Monitoring and Replacement   Recent Labs: Potassium (mmol/L)  Date Value  09/04/2020 3.7   Magnesium (mg/dL)  Date Value  09/04/2020 1.6 (L)   Calcium (mg/dL)  Date Value  09/04/2020 8.0 (L)   Albumin (g/dL)  Date Value  09/03/2020 3.0 (L)  07/08/2020 4.3   Phosphorus (mg/dL)  Date Value  09/04/2020 2.6   Sodium (mmol/L)  Date Value  09/04/2020 127 (L)  07/08/2020 143   Corrected Ca: 8.8  Assessment: 85 yo F with past medical history of CAD, CHF, recently increased her Lasix about a week ago due to some weight gain, GERD, hemorrhoids, HTN, and chronic incontinence who presents for assessment of their 4 days of some nonbloody nonbilious vomiting and general malaise. Sodium is low was started on hyptertonic saline but d/c'ed. Pt on Entresto daily. Pharmacy consulted to monitor and replace electrolytes.   MIVF: 0.9% NaCl @ 50 mL/hr  Goal of Therapy:  Electrolytes WNL  Plan:  --Na 127 (improving) - Continue NaCl 1 g PO TID with meals and 0.9% NaCl @ 50 mL/hr --Mg 1.6 - ordered Mg 2g IV x 1 dose --F/u electrolytes with AM labs  Benn Moulder, PharmD Pharmacy Resident  09/04/2020 12:38 PM

## 2020-09-04 NOTE — Evaluation (Signed)
Occupational Therapy Evaluation Patient Details Name: Teresa Moyer MRN: [id number] DOB: [birth year] Today's Date: 09/04/2020    History of Present Illness Pt is an 85 year old female presenting to the ED from home where she lives with her husband with complaints of 4 days of nonbloody nonbilious vomiting and general malaise. Pt work up showed severe hypokalemia and hyponatremia. Has been repleted at time of therapy.   Clinical Impression   Pt seen for OT evaluation this date in setting of acute hospitalization d/t hypokalemia and hyponatremia. Pt reports being INDEP at baseline including cooking/cleaning but relies on her husband for driving as she has macular degeneration. Pt currently with decreased standing tolerance, decreased fxl activity tolerance, and general weakness impacting her ability to perform ADLs/aDL mobility. Pt requires MIN A for bed mobility and ADL transfers with UE support of RW. Pt requires MOD A for LB ADLs and SETUP for seated UB ADLs. Will continue to follow acutely. Currently anticipate that pt will require f/u in STR setting as she is below her baseline with ADLs/ADL mobility, in order to safely return home with spouse. Will continue to monitor for progress with OT care plan.     Follow Up Recommendations  SNF    Equipment Recommendations  3 in 1 bedside commode;Tub/shower seat;Other (comment) (2ww)    Recommendations for Other Services       Precautions / Restrictions Precautions Precautions: Fall Restrictions Weight Bearing Restrictions: No      Mobility Bed Mobility Overal bed mobility: Needs Assistance Bed Mobility: Supine to Sit     Supine to sit: Min assist;HOB elevated     General bed mobility comments: minA for weight shift to EOB    Transfers Overall transfer level: Needs assistance Equipment used: Rolling walker (2 wheeled) Transfers: Sit to/from Stand Sit to Stand: Min assist         General transfer comment: requires UE support  and noted to have posterior lean requiring assist to shift hips FWD in fully erect stand.    Balance Overall balance assessment: Needs assistance Sitting-balance support: Feet supported Sitting balance-Leahy Scale: Fair     Standing balance support: Bilateral upper extremity supported Standing balance-Leahy Scale: Poor Standing balance comment: reliant on UE support                           ADL either performed or assessed with clinical judgement   ADL Overall ADL's : Needs assistance/impaired                                       General ADL Comments: pt requires SETUP for seated UB ADLs, MIN/MOD A for seated LB ADLs d/t some limited hip ROM and some decreased sitting balance.     Vision Patient Visual Report: No change from baseline       Perception     Praxis      Pertinent Vitals/Pain Pain Assessment: No/denies pain     Hand Dominance Right   Extremity/Trunk Assessment Upper Extremity Assessment Upper Extremity Assessment: Overall WFL for tasks assessed;Generalized weakness (ROM grossly WFL, MMT 4-/5)   Lower Extremity Assessment Lower Extremity Assessment: Defer to PT evaluation;Overall [facility] for tasks assessed;Generalized weakness (ROM grossly WFL with some limited hip rotation)   Cervical / Trunk Assessment Cervical / Trunk Assessment: Kyphotic;Other exceptions Cervical / Trunk Exceptions: neck stiff with L lateral lean preference.  Communication Communication Communication: No difficulties   Cognition Arousal/Alertness: Awake/alert Behavior During Therapy: WFL for tasks assessed/performed Overall Cognitive Status: Within Functional Limits for tasks assessed                                     General Comments       Exercises Other Exercises Other Exercises: OT education re: role of OT in acute setting and importance of OOB activity.   Shoulder Instructions      Home Living Family/patient expects to be  discharged to:: Private residence Living Arrangements: Spouse/significant other Available Help at Discharge: Family (husband with recent hospital admission, most likely not able to provide much physical assist) Type of Home: House Home Access: Stairs to enter [employer] of Steps: 7 Entrance Stairs-Rails: Right;Left Home Layout: One level     Bathroom Shower/Tub: Teacher, early years/pre: Handicapped height Bathroom Accessibility: Yes   Home Equipment: [place] - single point;Walker - 4 wheels;Bedside commode;Tub bench;Grab bars - tub/shower;Walker - 2 wheels   Additional Comments: can use handle on tub bench when performing toilet transfers      Prior Functioning/Environment Level of Independence: Needs assistance  Gait / Transfers Assistance Needed: pt reported using SPC more often the RW recently ADL's / Homemaking Assistance Needed: Denies needing assistance for ADLs reporting independence   Comments: Pt reports having macular degeneration and has difficulty seeing certain things and discerning color variances.        OT Problem List: Decreased strength;Impaired balance (sitting and/or standing);Decreased activity tolerance;Decreased knowledge of use of DME or AE;Cardiopulmonary status limiting activity      OT Treatment/Interventions: Self-care/ADL training;DME and/or AE instruction;Therapeutic activities;Balance training;Therapeutic exercise;Energy conservation;Patient/family education    OT Goals(Current goals can be found in the care plan section) Acute Rehab OT Goals Patient Stated Goal: to go home OT Goal Formulation: With patient Time For Goal Achievement: 09/18/20 Potential to Achieve Goals: Good  OT Frequency: Min 1X/week   Barriers to D/C:            Co-evaluation              AM-PAC OT "6 Clicks" Daily Activity     Outcome Measure Help from another person eating meals?: None Help from another person taking care of personal  grooming?: A Little Help from another person toileting, which includes using toliet, bedpan, or urinal?: A Little Help from another person bathing (including washing, rinsing, drying)?: A Little Help from another person to put on and taking off regular upper body clothing?: None Help from another person to put on and taking off regular lower body clothing?: A Little 6 Click Score: 20   End of Session Equipment Utilized During Treatment: Gait belt;Rolling walker Nurse Communication: Mobility status  Activity Tolerance: Patient tolerated treatment well Patient left: in bed;with call bell/phone within reach;with nursing/sitter in room (CNA presenting to transfer pt to 212)  OT Visit Diagnosis: Unsteadiness on feet (R26.81);Muscle weakness (generalized) (M62.81)                Time: 4742-5956 OT Time Calculation (min): 38 min Charges:  OT General Charges $OT Visit: 1 Visit OT Evaluation $OT Eval Moderate Complexity: 1 Mod OT Treatments $Self Care/Home Management : 8-22 mins $Therapeutic Activity: 8-22 mins  Gerrianne Scale, MS, OTR/L ascom [phone] 09/04/20, 5:38 PM

## 2020-09-05 DIAGNOSIS — E878 Other disorders of electrolyte and fluid balance, not elsewhere classified: Secondary | ICD-10-CM

## 2020-09-05 DIAGNOSIS — E876 Hypokalemia: Secondary | ICD-10-CM

## 2020-09-05 LAB — BASIC METABOLIC PANEL
Anion gap: 7 (ref 5–15)
BUN: 7 mg/dL — ABNORMAL LOW (ref 8–23)
CO2: 21 mmol/L — ABNORMAL LOW (ref 22–32)
Calcium: 8.4 mg/dL — ABNORMAL LOW (ref 8.9–10.3)
Chloride: 99 mmol/L (ref 98–111)
Creatinine, Ser: 0.38 mg/dL — ABNORMAL LOW (ref 0.44–1.00)
GFR, Estimated: >60 mL/min (ref >60)
Glucose, Bld: 98 mg/dL (ref 70–99)
Potassium: 3.9 mmol/L (ref 3.5–5.1)
Sodium: 127 mmol/L — ABNORMAL LOW (ref 135–145)

## 2020-09-05 LAB — GLUCOSE, CAPILLARY
Glucose-Capillary: 96 mg/dL (ref 70–99)
Glucose-Capillary: 120 mg/dL — ABNORMAL HIGH (ref 70–99)

## 2020-09-05 LAB — MAGNESIUM: Magnesium: 2.2 mg/dL (ref 1.7–2.4)

## 2020-09-05 LAB — CULTURE, BLOOD (SINGLE)
Culture: NO GROWTH
Special Requests: ADEQUATE

## 2020-09-05 LAB — PHOSPHORUS: Phosphorus: 2.7 mg/dL (ref 2.5–4.6)

## 2020-09-05 MED ORDER — SODIUM CHLORIDE 1 G PO TABS: 1.0000 g | ORAL_TABLET | Freq: Two times a day (BID) | ORAL | 0 refills | Status: AC

## 2020-09-05 NOTE — Consult Note (Signed)
[place] for Electrolyte Monitoring and Replacement   Recent Labs: Potassium (mmol/L)  Date Value  09/05/2020 3.9   Magnesium (mg/dL)  Date Value  09/05/2020 2.2   Calcium (mg/dL)  Date Value  09/05/2020 8.4 (L)   Albumin (g/dL)  Date Value  09/03/2020 3.0 (L)  07/08/2020 4.3   Phosphorus (mg/dL)  Date Value  09/05/2020 2.7   Sodium (mmol/L)  Date Value  09/05/2020 127 (L)  07/08/2020 143    Assessment: 85 yo F with past medical history of CAD, CHF, recently increased her Lasix about a week ago due to some weight gain, GERD, hemorrhoids, HTN, and chronic incontinence who presents for assessment of their 4 days of some nonbloody nonbilious vomiting and general malaise. Sodium is low was started on hyptertonic saline but d/c'ed. Pt on Entresto daily. Pharmacy consulted to monitor and replace electrolytes.   Goal of Therapy:  Electrolytes WNL  Plan:  --Na 127 - Continue NaCl 1 g PO TID --F/u electrolytes with AM labs  Benita Gutter 09/05/2020 7:54 AM

## 2020-09-05 NOTE — Discharge Summary (Signed)
Physician Discharge Summary  Teresa Moyer FXT:[id number] DOB: 1936-06-14 DOA: 08/31/2020  PCP: Jerrol Banana., MD  Admit date: 08/31/2020 Discharge date: 09/05/2020  Admitted From: Home Disposition: Home   Recommendations for Outpatient Follow-up:  1. Follow up with PCP in 1-2 weeks 2. Please obtain BMP/CBC in one week 3. Please follow up on the following pending results:None  Home Health:Yes Equipment/Devices: Rolling walker. Discharge Condition: Stable CODE STATUS: Full Diet recommendation: Heart Healthy  Brief/Interim Summary: 85 year old female presenting to the ED from home where she lives with her husband with complaints of 4 days of nonbloody nonbilious vomiting and general malaise. She denies fever/chills/productive cough/diarrhea/dysuria/pain. She does relate that her Lasix diuretic was increased from 40 mg daily to 40 mg twice daily about a week ago, she denies any other changes. No focal neurological deficits. Found to have multiple lab abnormalities which include severe hypokalemia with potassium at 2.7, chloride of 68, sodium of 105, BNP elevated at 477, troponin at 17 and 18 with flat curve, low serum and urine osmolality and urinary sodium of 39. CT abdomen with mild hydronephrosis and bladder distention and liver cirrhosis. Admitted in ICU for severe hyponatremia, initially requiring hypertonic saline, later transitioned to normal saline. Hyponatremia most likely multifactorial with nausea and vomiting, continue to take diuretics, recently increased dose of Lasix as she appears dehydrated.  CT abdomen with liver cirrhosis that can be contributory. She was also given extra salt to help with the sodium, it was 127 on discharge, she was instructed to use some salt tablets for next few days.  Patient had low EF at 40 to 45%, will need diuretic but at this time she appears euvolemic.  She was instructed to keep holding her diuretics and see her cardiologist in next  few days and restart according to their advice.  She will need a repeat BMP before starting those medications.  Patient did had some leukocytosis which were improving on discharge.  No obvious source of infection.  Procalcitonin remain negative.  Cultures negative.  Most likely secondary to nausea and vomiting.  There was no need for antibiotics.  Patient will continue rest of her home medications and follow-up with her providers.  Discharge Diagnoses:  Active Problems:   Hyponatremia   Acute hyponatremia   Nausea and vomiting   Weakness   Hypochloremia   Hypokalemia   Discharge Instructions  Discharge Instructions    Diet - low sodium heart healthy   Complete by: As directed    Discharge instructions   Complete by: As directed    It was pleasure taking care of you. Continue holding your Lasix for the next couple of days and discuss with your cardiologist to resume your original dose instead of the increase if that is appropriate. Keep yourself well-hydrated and take salt tablets for next few days.  You can also sprinkle some extra salt on your diet if you find those tablets very expensive. Please follow-up with your primary care provider and have your sodium level checked.   Increase activity slowly   Complete by: As directed      Allergies as of 09/05/2020      Reactions   Accupril [quinapril Hcl] Hives, Swelling   SWELLING OF NOSE/FACE   Sulfa Antibiotics Shortness Of Breath, Swelling   Clinoril [sulindac] Other (See Comments)   Unsure of reaction type   Hydrochlorothiazide Other (See Comments)   Unsure of reaction type   Spironolactone Diarrhea   Tessalon [benzonatate] Other (See Comments)  Unsure of reaction type   Amoxicillin Rash   Has patient had a PCN reaction causing immediate rash, facial/tongue/throat swelling, SOB or lightheadedness with hypotension: Unknown Has patient had a PCN reaction causing severe rash involving mucus membranes or skin necrosis:  Unknown Has patient had a PCN reaction that required hospitalization: No Has patient had a PCN reaction occurring within the last 10 years:Possibly unsure  If all of the above answers are "NO", then may proceed with Cephalosporin use.   Codeine Sulfate Nausea And Vomiting   Penicillins Rash   Has patient had a PCN reaction causing immediate rash, facial/tongue/throat swelling, SOB or lightheadedness with hypotension: Unknown Has patient had a PCN reaction causing severe rash involving mucus membranes or skin necrosis: Unknown Has patient had a PCN reaction that required hospitalization: No Has patient had a PCN reaction occurring within the last 10 years:Possibly unsure  If all of the above answers are "NO", then may proceed with Cephalosporin use.      Medication List    TAKE these medications   acetaminophen 325 MG tablet Commonly known as: TYLENOL Take 650 mg by mouth every 6 (six) hours as needed.   Align 4 MG Caps Take 8 mg by mouth daily after lunch.   atorvastatin 40 MG tablet Commonly known as: LIPITOR Take 1 tablet (40 mg total) by mouth daily at 6 PM.   azelastine 0.1 % nasal spray Commonly known as: ASTELIN Place 1 spray into both nostrils 2 (two) times daily.   carvedilol 12.5 MG tablet Commonly known as: COREG Take 12.5 mg by mouth 2 (two) times daily with a meal.   cetirizine 10 MG tablet Commonly known as: ZYRTEC Take 10 mg by mouth at bedtime.   fluticasone 50 MCG/ACT nasal spray Commonly known as: FLONASE Place 1 spray into both nostrils daily.   furosemide 40 MG tablet Commonly known as: LASIX TAKE 1 TABLET BY MOUTH TWICE DAILY   GenTeal 0.25-0.3 % Gel Generic drug: Carboxymethylcell-Hypromellose Apply 1 application to eye at bedtime.   metolazone 2.5 MG tablet Commonly known as: ZAROXOLYN Take 1 tablet (2.5 mg total) by mouth daily. 1 pill each morning for 5 days then hold until further evaluation for CHF   omeprazole 20 MG capsule Commonly  known as: PRILOSEC TAKE 1 CAPSULE BY MOUTH ONCE DAILY What changed: when to take this   ondansetron 4 MG disintegrating tablet Commonly known as: ZOFRAN-ODT Take 1 tablet (4 mg total) by mouth every 8 (eight) hours as needed for nausea or vomiting.   potassium chloride 10 MEQ tablet Commonly known as: KLOR-CON TAKE 1 TABLET BY MOUTH TWICE DAILY What changed: how much to take   PRESERVISION AREDS PO Take 1 tablet by mouth 2 (two) times daily.   sacubitril-valsartan 49-51 MG Commonly known as: ENTRESTO Take by mouth 2 (two) times daily.   sodium chloride 1 g tablet Take 1 tablet (1 g total) by mouth 2 (two) times daily with a meal for 7 days.   traMADol 50 MG tablet Commonly known as: ULTRAM Take 0.5 tablets (25 mg total) by mouth every 6 (six) hours as needed for moderate pain. Take 1/2 tablet by mouth every 4 hours PRN, moderate pain       Follow-up Information    Jerrol Banana., MD. Schedule an appointment as soon as possible for a visit.   Specialty: Family Medicine Contact information: [address] [place] [address] [phone]  Allergies  Allergen Reactions  . Accupril [Quinapril Hcl] Hives and Swelling    SWELLING OF NOSE/FACE  . Sulfa Antibiotics Shortness Of Breath and Swelling  . Clinoril [Sulindac] Other (See Comments)    Unsure of reaction type  . Hydrochlorothiazide Other (See Comments)    Unsure of reaction type  . Spironolactone Diarrhea  . Tessalon [Benzonatate] Other (See Comments)    Unsure of reaction type  . Amoxicillin Rash    Has patient had a PCN reaction causing immediate rash, facial/tongue/throat swelling, SOB or lightheadedness with hypotension: Unknown Has patient had a PCN reaction causing severe rash involving mucus membranes or skin necrosis: Unknown Has patient had a PCN reaction that required hospitalization: No Has patient had a PCN reaction occurring within the last 10 years:Possibly  unsure  If all of the above answers are "NO", then may proceed with Cephalosporin use.   . Codeine Sulfate Nausea And Vomiting  . Penicillins Rash    Has patient had a PCN reaction causing immediate rash, facial/tongue/throat swelling, SOB or lightheadedness with hypotension: Unknown Has patient had a PCN reaction causing severe rash involving mucus membranes or skin necrosis: Unknown Has patient had a PCN reaction that required hospitalization: No Has patient had a PCN reaction occurring within the last 10 years:Possibly unsure  If all of the above answers are "NO", then may proceed with Cephalosporin use.    Consultations:  PCCM  Procedures/Studies: DG Chest 2 View  Result Date: 08/31/2020 CLINICAL DATA:  Weakness EXAM: CHEST - 2 VIEW COMPARISON:  08/26/2020 FINDINGS: Cardiomegaly. Bibasilar atelectasis or scarring. No effusions or edema. No acute bony abnormality. IMPRESSION: Cardiomegaly. Bibasilar atelectasis or scarring. Electronically Signed   By: Rolm Baptise M.D.   On: 08/31/2020 19:29   DG Chest 2 View  Result Date: 08/28/2020 CLINICAL DATA:  Midthoracic pain since Friday, history of CHF EXAM: CHEST - 2 VIEW COMPARISON:  02/14/2020 FINDINGS: Frontal and lateral views of the chest demonstrate an unremarkable cardiac silhouette. Trace bilateral pleural effusions. No airspace disease or pneumothorax. Midthoracic compression deformity at approximately T7 is new since prior study, but otherwise age indeterminate. No other acute bony abnormalities. IMPRESSION: 1. T7 compression deformity, new since 02/14/2020. 2. Trace bilateral pleural effusions. These results will be called to the ordering clinician or representative by the Radiologist Assistant, and communication documented in the PACS or [employer]. Electronically Signed   By: Randa Ngo M.D.   On: 08/28/2020 01:31   DG Thoracic Spine 2 View  Result Date: 08/28/2020 CLINICAL DATA:  Back pain EXAM: THORACIC SPINE 2 VIEWS  COMPARISON:  02/14/2020 FINDINGS: Frontal and lateral views of the thoracic spine demonstrate a midthoracic compression deformity at approximately T7. There is greater than 50% loss of height. No evidence of retropulsion. This is new since 02/14/2020, but otherwise age indeterminate. No additional fractures are identified. Paraspinal soft tissues are normal. IMPRESSION: 1. Wedge compression deformity at T7, with greater than 50% loss of height. This is new since 02/14/2020. These results will be called to the ordering clinician or representative by the Radiologist Assistant, and communication documented in the PACS or [employer]. Electronically Signed   By: Randa Ngo M.D.   On: 08/28/2020 02:08   CT CHEST W CONTRAST  Result Date: 09/01/2020 CLINICAL DATA:  85 year old female presenting to the ED from home where she lives with her husband with complaints of 4 days of nonbloody nonbilious vomiting and general malaise. EXAM: CT CHEST WITH CONTRAST TECHNIQUE: Multidetector CT imaging of the  chest was performed during intravenous contrast administration. CONTRAST:  80mL OMNIPAQUE IOHEXOL 300 MG/ML  SOLN COMPARISON:  None. FINDINGS: Cardiovascular: Mildly enlarged heart size. No pericardial effusion. Normal caliber of the thoracic aorta and main pulmonary artery. Aortic atherosclerosis. Coronary artery calcification. While not specifically tailored for the evaluation of pulmonary emboli there are no filling defects in the visualized pulmonary arteries. Mediastinum/Nodes: No enlarged mediastinal, hilar, or axillary lymph nodes. Thyroid gland, trachea, and esophagus demonstrate no significant findings. Lungs/Pleura: Central airways are patent. There is scattered mild atelectasis and scarring. No focal consolidation. No pneumothorax or pleural effusion. No overt edema. Upper Abdomen: Cyst noted in the hepatic dome. There is intra and extrahepatic biliary duct dilation as described on recent CT.  Musculoskeletal: Chronic compression fracture in the midthoracic spine. No new acute osseous abnormality. IMPRESSION: 1. No acute intrathoracic process to explain the patient's symptoms. 2.  Mild cardiomegaly. Aortic Atherosclerosis (ICD10-I70.0). Electronically Signed   By: Audie Pinto M.D.   On: 09/01/2020 14:21   CT ABDOMEN PELVIS W CONTRAST  Result Date: 08/31/2020 CLINICAL DATA:  85 year old female with abdominal pain. EXAM: CT ABDOMEN AND PELVIS WITH CONTRAST TECHNIQUE: Multidetector CT imaging of the abdomen and pelvis was performed using the standard protocol following bolus administration of intravenous contrast. CONTRAST:  172mL OMNIPAQUE IOHEXOL 300 MG/ML  SOLN COMPARISON:  CT abdomen pelvis dated 01/08/2020. FINDINGS: Lower chest: The visualized lung bases are clear. There is mild cardiomegaly. No intra-abdominal free air or free fluid. Hepatobiliary: Cirrhosis. There is a 3.5 cm cyst in the dome of the liver. Dilatation of the intrahepatic and extrahepatic biliary tree similar to prior CT. Cholecystectomy. No retained calcified stone noted the central CBD. Pancreas: The a 1 cm low attenuating, possibly cystic lesion along the inferior aspect of the uncinate process of the pancreas (coronal 39/5 and axial 32/2), not characterized, possibly a side branch IPMN. This is unchanged compared to the the study of 01/04/2020. Attention on follow-up imaging or further characterization with MRI is recommended. No dilatation of the main pancreatic duct or gland atrophy. No active inflammatory changes. Spleen: Normal in size without focal abnormality. Adrenals/Urinary Tract: The adrenal glands unremarkable. Mild bilateral hydronephrosis and hydroureter, likely related to bladder distension and reflux. There is symmetric enhancement and excretion of contrast by both kidneys. The urinary bladder is distended otherwise grossly unremarkable. Stomach/Bowel: There is no bowel obstruction or active inflammation.  The appendix is normal. Vascular/Lymphatic: Mild aortoiliac atherosclerotic disease. The IVC is unremarkable. No portal venous gas. There is no adenopathy. Reproductive: Hysterectomy.  No adnexal masses. Other: Small fat containing umbilical hernia. Musculoskeletal: Osteopenia with degenerative changes of the spine. No acute osseous pathology. IMPRESSION: 1. No acute intra-abdominal or pelvic pathology. No bowel obstruction. Normal appendix. 2. Mild bilateral hydronephrosis and hydroureter, likely related to bladder distension and reflux. 3. Cirrhosis. 4. Aortic Atherosclerosis (ICD10-I70.0). Electronically Signed   By: Anner Crete M.D.   On: 08/31/2020 21:55    Subjective: Patient was feeling better when seen today.  No new complaint.  She was ready to go home. We discussed about her home dose of Lasix and some extra salt.  We also discussed about a close follow-up with cardiology for further recommendations about her diuretics.  Discharge Exam: Vitals:   09/05/20 0751 09/05/20 1159  BP: (!) 170/93 (!) 146/65  Pulse: 65 65  Resp: 16 17  Temp: 97.8 F (36.6 C) 98.1 F (36.7 C)  SpO2: 97% 98%   Vitals:   09/04/20 2327 09/05/20 0511 09/05/20  0751 09/05/20 1159  BP: (!) 166/65 (!) 169/66 (!) 170/93 (!) 146/65  Pulse: 75 66 65 65  Resp: 18 16 16 17   Temp: 98.1 F (36.7 C) 97.9 F (36.6 C) 97.8 F (36.6 C) 98.1 F (36.7 C)  TempSrc: Oral Oral Oral Oral  SpO2: 95% 96% 97% 98%  Weight:  64.5 kg    Height:  5\' 2"  (1.575 m)      General: Pt is alert, awake, not in acute distress Cardiovascular: RRR, S1/S2 +, no rubs, no gallops Respiratory: CTA bilaterally, no wheezing, no rhonchi Abdominal: Soft, NT, ND, bowel sounds + Extremities: no edema, no cyanosis   The results of significant diagnostics from this hospitalization (including imaging, microbiology, ancillary and laboratory) are listed below for reference.    Microbiology: Recent Results (from the past 240 hour(s))  Blood  culture (routine single)     Status: None   Collection Time: 08/31/20  7:32 PM   Specimen: BLOOD  Result Value Ref Range Status   Specimen Description BLOOD RIGHT ANTECUBITAL  Final   Special Requests   Final    BOTTLES DRAWN AEROBIC AND ANAEROBIC Blood Culture adequate volume   Culture   Final    NO GROWTH 5 DAYS Performed at [facility], [address] [place], [place] [address]    Report Status 09/05/2020 FINAL  Final  Resp Panel by RT-PCR (Flu A&B, Covid) Nasopharyngeal Swab     Status: None   Collection Time: 08/31/20  8:29 PM   Specimen: Nasopharyngeal Swab; Nasopharyngeal(NP) swabs in vial transport medium  Result Value Ref Range Status   SARS Coronavirus 2 by RT PCR NEGATIVE NEGATIVE Final    Comment: (NOTE) SARS-CoV-2 target nucleic acids are NOT DETECTED.  The SARS-CoV-2 RNA is generally detectable in upper respiratory specimens during the acute phase of infection. The lowest concentration of SARS-CoV-2 viral copies this assay can detect is 138 copies/mL. A negative result does not preclude SARS-Cov-2 infection and should not be used as the sole basis for treatment or other patient management decisions. A negative result may occur with  improper specimen collection/handling, submission of specimen other than nasopharyngeal swab, presence of viral mutation(s) within the areas targeted by this assay, and inadequate number of viral copies(<138 copies/mL). A negative result must be combined with clinical observations, patient history, and epidemiological information. The expected result is Negative.  Fact Sheet for Patients:  EntrepreneurPulse.com.au  Fact Sheet for Healthcare Providers:  IncredibleEmployment.be  This test is no t yet approved or cleared by the [place] FDA and  has been authorized for detection and/or diagnosis of SARS-CoV-2 by FDA under an Emergency Use Authorization (EUA). This EUA will remain  in  effect (meaning this test can be used) for the duration of the COVID-19 declaration under Section 564(b)(1) of the Act, 21 U.S.C.section 360bbb-3(b)(1), unless the authorization is terminated  or revoked sooner.       Influenza A by PCR NEGATIVE NEGATIVE Final   Influenza B by PCR NEGATIVE NEGATIVE Final    Comment: (NOTE) The Xpert Xpress SARS-CoV-2/FLU/RSV plus assay is intended as an aid in the diagnosis of influenza from Nasopharyngeal swab specimens and should not be used as a sole basis for treatment. Nasal washings and aspirates are unacceptable for Xpert Xpress SARS-CoV-2/FLU/RSV testing.  Fact Sheet for Patients: EntrepreneurPulse.com.au  Fact Sheet for Healthcare Providers: IncredibleEmployment.be  This test is not yet approved or cleared by the [place] FDA and has been authorized for detection and/or diagnosis of  SARS-CoV-2 by FDA under an Emergency Use Authorization (EUA). This EUA will remain in effect (meaning this test can be used) for the duration of the COVID-19 declaration under Section 564(b)(1) of the Act, 21 U.S.C. section 360bbb-3(b)(1), unless the authorization is terminated or revoked.  Performed at [facility], [address] [place], [place] [address]   Urine culture     Status: None   Collection Time: 08/31/20 11:09 PM   Specimen: In/Out Cath Urine  Result Value Ref Range Status   Specimen Description   Final    IN/OUT CATH URINE Performed at [facility], [address], [place], [place] [address]    Special Requests   Final    NONE Performed at [facility], [address] [place], [place] [address]    Culture   Final    NO GROWTH Performed at [facility], [address], [place], [place] [address]    Report Status 09/02/2020 FINAL  Final  MRSA PCR Screening     Status: None   Collection Time: 09/01/20 12:58 PM   Specimen: Nasal Mucosa; Nasopharyngeal   Result Value Ref Range Status   MRSA by PCR NEGATIVE NEGATIVE Final    Comment:        The GeneXpert MRSA Assay (FDA approved for NASAL specimens only), is one component of a comprehensive MRSA colonization surveillance program. It is not intended to diagnose MRSA infection nor to guide or monitor treatment for MRSA infections. Performed at [facility], [address] [place], [place] [address]      Labs: BNP (last 3 results) Recent Labs    01/04/20 0148 05/15/20 1642 08/31/20 1811  BNP 2,472.3* 4,865.0* 638.4*   Basic Metabolic Panel: Recent Labs  Lab 09/01/20 0602 09/01/20 1255 09/02/20 0301 09/02/20 0705 09/03/20 0404 09/03/20 1123 09/03/20 1658 09/04/20 0422 09/04/20 1644 09/05/20 0503  NA 115*   < > 117*   < > 124* 125* 125* 127* 126* 127*  K 3.2*   < > 3.6  --  4.0  --  3.9 3.7 4.0 3.9  CL 82*   < > 85*  --  96*  --  97* 100 99 99  CO2 23   < > 21*  --  22  --  20* 22 21* 21*  GLUCOSE 88   < > 79  --  103*  --  129* 122* 126* 98  BUN 17   < > 13  --  13  --  12 11 10  7*  CREATININE 0.62   < > 0.71  --  0.57  --  0.81 0.50 0.47 0.38*  CALCIUM 8.3*   < > 8.3*  --  8.3*  --  8.1* 8.0* 8.1* 8.4*  MG 2.5*  --  2.0  --  1.8  --   --  1.6*  --  2.2  PHOS 2.8  --  3.1  --  2.3*  --   --  2.6  --  2.7   < > = values in this interval not displayed.   Liver Function Tests: Recent Labs  Lab 08/31/20 1917 09/01/20 0602 09/01/20 1255 09/02/20 0301 09/03/20 0404  AST 33 27 31 25 20   ALT 24 20 24 21 17   ALKPHOS 90 78 78 83 79  BILITOT 1.7* 1.7* 1.6* 1.8* 1.2  PROT 6.8 5.8* 6.1* 5.9* 5.6*  ALBUMIN 3.7 3.2* 3.4* 3.1* 3.0*   No results for input(s): LIPASE, AMYLASE in the  last 168 hours. No results for input(s): AMMONIA in the last 168 hours. CBC: Recent Labs  Lab 08/31/20 1811 09/01/20 0602 09/02/20 0301 09/03/20 0404 09/04/20 0422  WBC 20.9* 19.3* 22.5* 17.8* 14.8*  HGB 12.8 12.2 12.0 11.1* 10.7*  HCT 34.2* 32.7* 32.7* 31.2* 29.9*  MCV  82.6 83.4 86.1 87.4 87.7  PLT 227 193 211 202 203   Cardiac Enzymes: No results for input(s): CKTOTAL, CKMB, CKMBINDEX, TROPONINI in the last 168 hours. BNP: Invalid input(s): POCBNP CBG: Recent Labs  Lab 09/04/20 1119 09/04/20 1515 09/04/20 2114 09/05/20 0732 09/05/20 1156  GLUCAP 114* 128* 126* 96 120*   D-Dimer No results for input(s): DDIMER in the last 72 hours. Hgb A1c No results for input(s): HGBA1C in the last 72 hours. Lipid Profile No results for input(s): CHOL, HDL, LDLCALC, TRIG, CHOLHDL, LDLDIRECT in the last 72 hours. Thyroid function studies No results for input(s): TSH, T4TOTAL, T3FREE, THYROIDAB in the last 72 hours.  Invalid input(s): FREET3 Anemia work up No results for input(s): VITAMINB12, FOLATE, FERRITIN, TIBC, IRON, RETICCTPCT in the last 72 hours. Urinalysis    Component Value Date/Time   COLORURINE COLORLESS (A) 08/31/2020 2309   APPEARANCEUR CLEAR (A) 08/31/2020 2309   LABSPEC 1.020 08/31/2020 2309   PHURINE 8.0 08/31/2020 2309   GLUCOSEU NEGATIVE 08/31/2020 2309   HGBUR NEGATIVE 08/31/2020 2309   BILIRUBINUR NEGATIVE 08/31/2020 2309   KETONESUR NEGATIVE 08/31/2020 2309   PROTEINUR NEGATIVE 08/31/2020 2309   UROBILINOGEN 0.2 04/15/2009 0859   NITRITE NEGATIVE 08/31/2020 2309   LEUKOCYTESUR NEGATIVE 08/31/2020 2309   Sepsis Labs Invalid input(s): PROCALCITONIN,  WBC,  LACTICIDVEN Microbiology Recent Results (from the past 240 hour(s))  Blood culture (routine single)     Status: None   Collection Time: 08/31/20  7:32 PM   Specimen: BLOOD  Result Value Ref Range Status   Specimen Description BLOOD RIGHT ANTECUBITAL  Final   Special Requests   Final    BOTTLES DRAWN AEROBIC AND ANAEROBIC Blood Culture adequate volume   Culture   Final    NO GROWTH 5 DAYS Performed at [facility], [address] [place], [place] [address]    Report Status 09/05/2020 FINAL  Final  Resp Panel by RT-PCR (Flu A&B, Covid) Nasopharyngeal Swab      Status: None   Collection Time: 08/31/20  8:29 PM   Specimen: Nasopharyngeal Swab; Nasopharyngeal(NP) swabs in vial transport medium  Result Value Ref Range Status   SARS Coronavirus 2 by RT PCR NEGATIVE NEGATIVE Final    Comment: (NOTE) SARS-CoV-2 target nucleic acids are NOT DETECTED.  The SARS-CoV-2 RNA is generally detectable in upper respiratory specimens during the acute phase of infection. The lowest concentration of SARS-CoV-2 viral copies this assay can detect is 138 copies/mL. A negative result does not preclude SARS-Cov-2 infection and should not be used as the sole basis for treatment or other patient management decisions. A negative result may occur with  improper specimen collection/handling, submission of specimen other than nasopharyngeal swab, presence of viral mutation(s) within the areas targeted by this assay, and inadequate number of viral copies(<138 copies/mL). A negative result must be combined with clinical observations, patient history, and epidemiological information. The expected result is Negative.  Fact Sheet for Patients:  EntrepreneurPulse.com.au  Fact Sheet for Healthcare Providers:  IncredibleEmployment.be  This test is no t yet approved or cleared by the [place] FDA and  has been authorized for detection and/or diagnosis of SARS-CoV-2 by FDA under an Emergency Use Authorization (  EUA). This EUA will remain  in effect (meaning this test can be used) for the duration of the COVID-19 declaration under Section 564(b)(1) of the Act, 21 U.S.C.section 360bbb-3(b)(1), unless the authorization is terminated  or revoked sooner.       Influenza A by PCR NEGATIVE NEGATIVE Final   Influenza B by PCR NEGATIVE NEGATIVE Final    Comment: (NOTE) The Xpert Xpress SARS-CoV-2/FLU/RSV plus assay is intended as an aid in the diagnosis of influenza from Nasopharyngeal swab specimens and should not be used as a sole basis  for treatment. Nasal washings and aspirates are unacceptable for Xpert Xpress SARS-CoV-2/FLU/RSV testing.  Fact Sheet for Patients: EntrepreneurPulse.com.au  Fact Sheet for Healthcare Providers: IncredibleEmployment.be  This test is not yet approved or cleared by the [place] FDA and has been authorized for detection and/or diagnosis of SARS-CoV-2 by FDA under an Emergency Use Authorization (EUA). This EUA will remain in effect (meaning this test can be used) for the duration of the COVID-19 declaration under Section 564(b)(1) of the Act, 21 U.S.C. section 360bbb-3(b)(1), unless the authorization is terminated or revoked.  Performed at [facility], [address] [place], [place] [address]   Urine culture     Status: None   Collection Time: 08/31/20 11:09 PM   Specimen: In/Out Cath Urine  Result Value Ref Range Status   Specimen Description   Final    IN/OUT CATH URINE Performed at [facility], [address], [place], [place] [address]    Special Requests   Final    NONE Performed at [facility], [address], [place], [place] [address]    Culture   Final    NO GROWTH Performed at [facility], [place] [address] [place], [place] [address]    Report Status 09/02/2020 FINAL  Final  MRSA PCR Screening     Status: None   Collection Time: 09/01/20 12:58 PM   Specimen: Nasal Mucosa; Nasopharyngeal  Result Value Ref Range Status   MRSA by PCR NEGATIVE NEGATIVE Final    Comment:        The GeneXpert MRSA Assay (FDA approved for NASAL specimens only), is one component of a comprehensive MRSA colonization surveillance program. It is not intended to diagnose MRSA infection nor to guide or monitor treatment for MRSA infections. Performed at [facility], [address] [place], [place] [address]     Time coordinating discharge: Over 30 minutes  SIGNED:  Lorella Nimrod, MD  [facility] 09/05/2020, 3:10 PM  If 7PM-7AM, please contact night-coverage [email or web address]  This record has been created using Systems analyst. Errors have been sought and corrected,but may not always be located. Such creation errors do not reflect on the standard of care.

## 2020-09-05 NOTE — Care Management Important Message (Signed)
Important Message  Patient Details  Name: Teresa Moyer MRN: [id number] Date of Birth: 1936-05-05   Medicare Important Message Given:  Yes     Dannette Barbara 09/05/2020, 1:59 PM

## 2020-09-05 NOTE — Progress Notes (Signed)
Physical Therapy Treatment Patient Details Name: Teresa Moyer MRN: [id number] DOB: [birth year] Today's Date: 09/05/2020    History of Present Illness Pt is an 85 year old female presenting to the ED from home where she lives with her husband with complaints of 4 days of nonbloody nonbilious vomiting and general malaise. Pt work up showed severe hypokalemia and hyponatremia. Has been repleted at time of therapy.    PT Comments    Patient received in bed, flat affect. Agrees to PT session. She reports she is feeling a little stronger than 2 days ago. She performed bed mobility with mod independence. She has poor initial standing balance requiring hands on for fall prevention and posterior lean. Patient requires min guard and rw during ambulation of 40 feet. She will continue to benefit from skilled PT while here to improve strength and safety with mobility. If patient could have 24 hour assist upon initial return home that would be helpful for fall prevention.     Follow Up Recommendations  Home health PT;Supervision/Assistance - 24 hour;Supervision for mobility/OOB     Equipment Recommendations  None recommended by PT;Other (comment) (patient has walker at home)    Recommendations for Other Services       Precautions / Restrictions Precautions Precautions: Fall Restrictions Weight Bearing Restrictions: No    Mobility  Bed Mobility Overal bed mobility: Modified Independent Bed Mobility: Supine to Sit     Supine to sit: HOB elevated;Modified independent (Device/Increase time)     General bed mobility comments: increased time/effort needed but no physical assist porvided.    Transfers Overall transfer level: Needs assistance Equipment used: Rolling walker (2 wheeled) Transfers: Sit to/from Stand Sit to Stand: Min assist         General transfer comment: requires assistance for initial standing balance. Posterior lean, assisted patient to pull up undergarments and  patient lost balance and sat back down on bed.  Ambulation/Gait Ambulation/Gait assistance: Min guard Gait Distance (Feet): 40 Feet Assistive device: Rolling walker (2 wheeled) Gait Pattern/deviations: Step-through pattern;Decreased step length - right;Decreased step length - left;Shuffle Gait velocity: decreased   General Gait Details: patient demonstrated improved balance once ambulating, required min guard only. Limited by fatigue   Stairs             Wheelchair Mobility    Modified Rankin (Stroke Patients Only)       Balance Overall balance assessment: Needs assistance Sitting-balance support: Feet supported Sitting balance-Leahy Scale: Good     Standing balance support: Bilateral upper extremity supported;During functional activity Standing balance-Leahy Scale: Fair Standing balance comment: reliant on B UE support, poor initial standing balance                            Cognition Arousal/Alertness: Awake/alert Behavior During Therapy: Flat affect Overall Cognitive Status: Within Functional Limits for tasks assessed                                        Exercises      General Comments        Pertinent Vitals/Pain Pain Assessment: No/denies pain    Home Living                      Prior Function            PT Goals (current goals can now  be found in the care plan section) Acute Rehab PT Goals Patient Stated Goal: to go home PT Goal Formulation: With patient Time For Goal Achievement: 09/17/20 Potential to Achieve Goals: Good Progress towards PT goals: Progressing toward goals    Frequency    Min 2X/week      PT Plan Discharge plan needs to be updated    Co-evaluation              AM-PAC PT "6 Clicks" Mobility   Outcome Measure  Help needed turning from your back to your side while in a flat bed without using bedrails?: None Help needed moving from lying on your back to sitting on the  side of a flat bed without using bedrails?: None Help needed moving to and from a bed to a chair (including a wheelchair)?: A Little Help needed standing up from a chair using your arms (e.g., wheelchair or bedside chair)?: A Little Help needed to walk in hospital room?: A Little Help needed climbing 3-5 steps with a railing? : A Lot 6 Click Score: 19    End of Session Equipment Utilized During Treatment: Gait belt Activity Tolerance: Patient limited by fatigue Patient left: in chair;with call bell/phone within reach;with chair alarm set Nurse Communication: Mobility status PT Visit Diagnosis: Other abnormalities of gait and mobility (R26.89);Muscle weakness (generalized) (M62.81);Difficulty in walking, not elsewhere classified (R26.2)     Time: 0102-7253 PT Time Calculation (min) (ACUTE ONLY): 25 min  Charges:  $Gait Training: 8-22 mins $Therapeutic Activity: 8-22 mins                     [employer], PT, GCS 09/05/20,11:31 AM

## 2020-09-05 NOTE — TOC Initial Note (Signed)
Transition of Care [facility]) - Initial/Assessment Note    Patient Details  Name: Teresa Moyer MRN: [id number] Date of Birth: Dec 23, 1935  Transition of Care [facility]) CM/SW Contact:    Beverly Sessions, RN Phone Number: 09/05/2020, 3:08 PM  Clinical Narrative:                 Patient admitted from home with hyponatremia Patient states that she lives a thome with Husband PCP [place] - denies issues obtaining medications  Patient states that she still drives at times, if needed her husband or sister takes her  Sister will be transporting at discharge today  PT has assessed patient and recommends home health PT.  Patient states that she has RW, BSC and shower bench. Patient prefers [facility] for home health.  Per [place] with [facility] they do not have the staffing to accept patient.  Patient notified.  Patient states that she does not have a preference then for agency  Referral made and accepted by Corene Cornea with [facility]  Expected Discharge Plan: [place] Barriers to Discharge: No Barriers Identified   Patient Goals and CMS Choice     Choice offered to / list presented to : Patient  Expected Discharge Plan and Services Expected Discharge Plan: [place]   Discharge Planning Services: CM Consult Post Acute Care Choice: [place] arrangements for the past 2 months: Single Family Home Expected Discharge Date: 09/05/20                         HH Arranged: RN,PT,OT,Nurse's Aide [place] Agency: [place] ([place]) Date HH Agency Contacted: 09/05/20   Representative spoke with at [place]: Corene Cornea  Prior Living Arrangements/Services Living arrangements for the past 2 months: [place] Lives with:: Spouse Patient language and need for interpreter reviewed:: Yes Do you feel safe going back to the place where you live?: Yes      Need for Family Participation in Patient Care: Yes (Comment) Care  giver support system in place?: Yes (comment) Current home services: DME Criminal Activity/Legal Involvement Pertinent to Current Situation/Hospitalization: No - Comment as needed  Activities of Daily Living Home Assistive Devices/Equipment: None ADL Screening (condition at time of admission) Patient's cognitive ability adequate to safely complete daily activities?: Yes Is the patient deaf or have difficulty hearing?: No Does the patient have difficulty seeing, even when wearing glasses/contacts?: Yes Does the patient have difficulty concentrating, remembering, or making decisions?: No Patient able to express need for assistance with ADLs?: Yes Does the patient have difficulty dressing or bathing?: No Independently performs ADLs?: Yes (appropriate for developmental age) Does the patient have difficulty walking or climbing stairs?: No Weakness of Legs: None Weakness of Arms/Hands: None  Permission Sought/Granted                  Emotional Assessment Appearance:: Appears stated age Attitude/Demeanor/Rapport: Engaged Affect (typically observed): Accepting Orientation: : Oriented to Self,Oriented to Place,Oriented to  Time,Oriented to Situation Alcohol / Substance Use: Not Applicable Psych Involvement: No (comment)  Admission diagnosis:  Acute hyponatremia [E87.1] Hypokalemia [E87.6] Hypochloremia [E87.8] Urinary retention [R33.9] Hyponatremia [E87.1] Weakness [R53.1] Nausea and vomiting, intractability of vomiting not specified, unspecified vomiting type [R11.2] Patient Active Problem List   Diagnosis Date Noted  . Hypochloremia   . Hypokalemia   . Nausea and vomiting   . Weakness   . Acute hyponatremia 08/31/2020  . Acute  CHF (congestive heart failure) ([place]) 01/04/2020  . Acute blood loss anemia   . Hyponatremia   . Total knee replacement status 12/20/2019  . Acute on chronic combined systolic and diastolic CHF (congestive heart failure) ([place]) 09/06/2019  . Acute  respiratory failure with hypoxia ([place]) 01/30/2019  . Coronary artery disease involving native coronary artery of native heart 01/11/2019  . STEMI (ST elevation myocardial infarction) ([place]) 12/27/2018  . STEMI involving left anterior descending coronary artery ([place]) 12/27/2018  . Acute on chronic cholecystitis 02/16/2018  . Gallstones 12/15/2017  . Actinic keratosis 12/18/2014  . Allergic rhinitis 12/18/2014  . Arthritis 12/18/2014  . Basal cell carcinoma of skin 12/18/2014  . Gonalgia 12/18/2014  . Narrowing of intervertebral disc space 12/18/2014  . Hypertension 12/18/2014  . Gastro-esophageal reflux disease without esophagitis 12/18/2014  . HLD (hyperlipidemia) 12/18/2014  . Cardiac murmur 12/18/2014  . Arthritis, degenerative 12/18/2014  . Hypertonicity of bladder 12/18/2014  . Detrusor muscle hypertonia 12/18/2014  . Heart valve disease 12/18/2014  . Asymptomatic varicose veins 12/18/2014   PCP:  Jerrol Banana., MD Pharmacy:   [place], [place] [address] Phone: [phone] Fax: [phone]     Social Determinants of Health (SDOH) Interventions    Readmission Risk Interventions No flowsheet data found.

## 2020-09-06 ENCOUNTER — Telehealth: Payer: Self-pay

## 2020-09-06 MED ORDER — CEFDINIR 300 MG PO CAPS: 600.0000 mg | ORAL_CAPSULE | Freq: Every day | ORAL | 0 refills | Status: AC

## 2020-09-06 NOTE — Telephone Encounter (Signed)
Patient is requesting something be calling in for UTI. She has burning and pain on urination X 3 days. Please advise. Thanks!

## 2020-09-06 NOTE — Telephone Encounter (Signed)
Advised patient

## 2020-09-06 NOTE — Telephone Encounter (Signed)
done

## 2020-09-06 NOTE — Telephone Encounter (Signed)
Copied from [place] [id number]. Topic: General - Other >> Sep 06, 2020  9:14 AM Tessa Lerner A wrote: Reason for CRM: Patient would like to be prescribed something to help treat a potential "bladder infection"  Patient's preferred pharmacy is [place], [place].  Phone:  [phone]  Patient was discharged  from [facility] on 09/05/20 and shares they were not experiencing any symptoms at the time of hospitalization  Patient would like to be notified if/when a prescription is submitted for them  Please contact to advise further if needed >> Sep 06, 2020  9:19 AM Tessa Lerner A wrote: Patient declined to make an appointment to see PCP at the time of call with agent

## 2020-09-06 NOTE — Telephone Encounter (Signed)
Pt was calling to follow up on her earlier request, Pt stated she is in pain and believes she has a UTI, please advise.

## 2020-09-07 DIAGNOSIS — Z79891 Long term (current) use of opiate analgesic: Secondary | ICD-10-CM | POA: Diagnosis not present

## 2020-09-07 DIAGNOSIS — N1330 Unspecified hydronephrosis: Secondary | ICD-10-CM | POA: Diagnosis not present

## 2020-09-07 DIAGNOSIS — Z7982 Long term (current) use of aspirin: Secondary | ICD-10-CM | POA: Diagnosis not present

## 2020-09-07 DIAGNOSIS — Z9089 Acquired absence of other organs: Secondary | ICD-10-CM | POA: Diagnosis not present

## 2020-09-07 DIAGNOSIS — R32 Unspecified urinary incontinence: Secondary | ICD-10-CM | POA: Diagnosis not present

## 2020-09-07 DIAGNOSIS — Z7902 Long term (current) use of antithrombotics/antiplatelets: Secondary | ICD-10-CM | POA: Diagnosis not present

## 2020-09-07 DIAGNOSIS — K7460 Unspecified cirrhosis of liver: Secondary | ICD-10-CM | POA: Diagnosis not present

## 2020-09-07 DIAGNOSIS — Z90710 Acquired absence of both cervix and uterus: Secondary | ICD-10-CM | POA: Diagnosis not present

## 2020-09-07 DIAGNOSIS — Z85828 Personal history of other malignant neoplasm of skin: Secondary | ICD-10-CM | POA: Diagnosis not present

## 2020-09-07 DIAGNOSIS — I2510 Atherosclerotic heart disease of native coronary artery without angina pectoris: Secondary | ICD-10-CM | POA: Diagnosis not present

## 2020-09-07 DIAGNOSIS — H269 Unspecified cataract: Secondary | ICD-10-CM | POA: Diagnosis not present

## 2020-09-07 DIAGNOSIS — N3289 Other specified disorders of bladder: Secondary | ICD-10-CM | POA: Diagnosis not present

## 2020-09-07 DIAGNOSIS — E871 Hypo-osmolality and hyponatremia: Secondary | ICD-10-CM | POA: Diagnosis not present

## 2020-09-07 DIAGNOSIS — H3530 Unspecified macular degeneration: Secondary | ICD-10-CM | POA: Diagnosis not present

## 2020-09-07 DIAGNOSIS — E876 Hypokalemia: Secondary | ICD-10-CM | POA: Diagnosis not present

## 2020-09-07 DIAGNOSIS — K219 Gastro-esophageal reflux disease without esophagitis: Secondary | ICD-10-CM | POA: Diagnosis not present

## 2020-09-07 DIAGNOSIS — I5022 Chronic systolic (congestive) heart failure: Secondary | ICD-10-CM | POA: Diagnosis not present

## 2020-09-07 DIAGNOSIS — Z950 Presence of cardiac pacemaker: Secondary | ICD-10-CM | POA: Diagnosis not present

## 2020-09-07 DIAGNOSIS — I110 Hypertensive heart disease with heart failure: Secondary | ICD-10-CM | POA: Diagnosis not present

## 2020-09-07 DIAGNOSIS — Z9049 Acquired absence of other specified parts of digestive tract: Secondary | ICD-10-CM | POA: Diagnosis not present

## 2020-09-07 DIAGNOSIS — R011 Cardiac murmur, unspecified: Secondary | ICD-10-CM | POA: Diagnosis not present

## 2020-09-07 DIAGNOSIS — E878 Other disorders of electrolyte and fluid balance, not elsewhere classified: Secondary | ICD-10-CM | POA: Diagnosis not present

## 2020-09-07 DIAGNOSIS — I252 Old myocardial infarction: Secondary | ICD-10-CM | POA: Diagnosis not present

## 2020-09-07 DIAGNOSIS — R1310 Dysphagia, unspecified: Secondary | ICD-10-CM | POA: Diagnosis not present

## 2020-09-07 DIAGNOSIS — M1990 Unspecified osteoarthritis, unspecified site: Secondary | ICD-10-CM | POA: Diagnosis not present

## 2020-09-09 ENCOUNTER — Ambulatory Visit: Payer: Self-pay | Admitting: *Deleted

## 2020-09-09 DIAGNOSIS — I252 Old myocardial infarction: Secondary | ICD-10-CM | POA: Diagnosis not present

## 2020-09-09 DIAGNOSIS — I110 Hypertensive heart disease with heart failure: Secondary | ICD-10-CM | POA: Diagnosis not present

## 2020-09-09 DIAGNOSIS — I2510 Atherosclerotic heart disease of native coronary artery without angina pectoris: Secondary | ICD-10-CM | POA: Diagnosis not present

## 2020-09-09 DIAGNOSIS — E871 Hypo-osmolality and hyponatremia: Secondary | ICD-10-CM | POA: Diagnosis not present

## 2020-09-09 DIAGNOSIS — I5022 Chronic systolic (congestive) heart failure: Secondary | ICD-10-CM | POA: Diagnosis not present

## 2020-09-09 DIAGNOSIS — R011 Cardiac murmur, unspecified: Secondary | ICD-10-CM | POA: Diagnosis not present

## 2020-09-09 NOTE — Telephone Encounter (Signed)
Add metolazone 2.5 mg every morning for 3 mornings.  She probably has prescription at the house.

## 2020-09-09 NOTE — Telephone Encounter (Signed)
Patient wants to wait and discuss new medication with Dr Rosanna Randy tomorrow at her appt. Will hold off on sending in medication for now.

## 2020-09-09 NOTE — Telephone Encounter (Signed)
Per initial encounter, "Pt is calling and has had a weight gain of 3 lbs in 4 days. Pt has chf. Pt has take lasix 40 mg today and yesterday"; contacted pt regarding her symptoms; She took 40 mg lasix on 09/08/20 and 09/09/20; her weights were 144 lbs on  3/28, 142.8 lbs 3/27; 3/26 140.6 lbs on 09/07/20, and 141.0 lbs on 09/06/20; the pt says she having swelling In her stomach starting 3/27/22but it may due to  no good BM in a week; the pt says Dr Rosanna Randy prescribed her furosemide; it is ordered 40 mg 1 mg PO twice daily; she denies shortness of breath or chest pain; she also says Dale has been out and her BP is "ok"; d/c'd from hospital on 09/05/20; recommendations made per nurse triage protocol; decision tree completed; the pt sees Dr Rosanna Randy at [facility]; he has no availibility within timeframe per protocol; spoke with Idelle Jo regarding schedulining the pt; per Arbie Cookey pt offered and accepted appt with Dr Rosanna Randy, 09/10/20 at 1120; will route to office for notification. Reason for Disposition . [1] MILD swelling of both ankles (i.e., pedal edema) AND [2] new-onset or worsening  Answer Assessment - Initial Assessment Questions 1. MAIN CONCERN OR SYMPTOM:  "What is your main concern right now?" "What questions do you have?" "What's the main symptom you're worried about?" (e.g., breathing difficulty, ankle swelling, weight gain.)     Weight gain 2. ONSET: "When did the    start?"     09/06/20 3. BREATHING DIFFICULTY: "Are you having any difficulty breathing?" If Yes, ask: "How bad is it?"  (e.g., none, mild, moderate, severe)   - MILD: No SOB at rest, mild SOB with walking, speaks normally in sentences, able to lie down, no retractions, pulse < 100.   - MODERATE: SOB at rest, SOB with minimal exertion and prefers to sit, cannot lie down flat, speaks in phrases, mild retractions, audible wheezing, pulse 100-120.   - SEVERE: Very SOB at rest, speaks in single words, struggling to breathe, sitting  hunched forward, retractions, pulse > 120    No shortness of breath 4. BETTER-SAME-WORSE: "Are you getting better, staying the same, or getting worse compared to the day you were discharged?"     Same to better 5. HOSPITALIZATION: "How long were you hospitalized?" (e.g., days)    08/31/20-09/05/20 6. DISCHARGE DATE: "What date were you discharged from the hospital?"     09/05/20 7. DISCHARGE DOCTOR: "Who is the main doctor taking care of you now?"      8. DISCHARGE APPOINTMENT: "Have you scheduled a follow-up discharge appointment with your doctor?"    no 9. DISCHARGE MEDICATIONS: "Did the physician who discharged you order any new medications for you to use?" If Yes, ask: "Have you filled the prescription and started taking the medication?"       10. WEIGHT - DISCHARGE:  "Do you know your weight when you were discharged from the hospital?"        11. WEIGHT - TARGET:  "Do you have a target weight?"       Pt doesn't know target weight 12. WEIGHT - CURRENT:  "What is your current weight?"        142.8 lobs on 09/09/20 13. OTHER SYMPTOMS: "Do you have any other symptoms?" (e.g., depression, weakness)       "Swelling in stomach" starting 09/08/20  Protocols used: HEART FAILURE POST-HOSPITALIZATION FOLLOW-UP CALL-A-AH

## 2020-09-10 ENCOUNTER — Telehealth: Payer: Self-pay | Admitting: Family Medicine

## 2020-09-10 ENCOUNTER — Other Ambulatory Visit: Payer: Self-pay

## 2020-09-10 ENCOUNTER — Telehealth: Payer: Self-pay

## 2020-09-10 ENCOUNTER — Encounter: Payer: Self-pay | Admitting: Family Medicine

## 2020-09-10 ENCOUNTER — Ambulatory Visit (INDEPENDENT_AMBULATORY_CARE_PROVIDER_SITE_OTHER): Payer: Medicare Other | Admitting: Family Medicine

## 2020-09-10 VITALS — BP 170/81 | HR 80 | Temp 98.5°F | Resp 18 | Wt 149.0 lb

## 2020-09-10 DIAGNOSIS — D72829 Elevated white blood cell count, unspecified: Secondary | ICD-10-CM

## 2020-09-10 DIAGNOSIS — I25118 Atherosclerotic heart disease of native coronary artery with other forms of angina pectoris: Secondary | ICD-10-CM | POA: Diagnosis not present

## 2020-09-10 DIAGNOSIS — I252 Old myocardial infarction: Secondary | ICD-10-CM | POA: Diagnosis not present

## 2020-09-10 DIAGNOSIS — E876 Hypokalemia: Secondary | ICD-10-CM | POA: Diagnosis not present

## 2020-09-10 DIAGNOSIS — I5022 Chronic systolic (congestive) heart failure: Secondary | ICD-10-CM | POA: Diagnosis not present

## 2020-09-10 DIAGNOSIS — I5042 Chronic combined systolic (congestive) and diastolic (congestive) heart failure: Secondary | ICD-10-CM | POA: Diagnosis not present

## 2020-09-10 DIAGNOSIS — N3000 Acute cystitis without hematuria: Secondary | ICD-10-CM

## 2020-09-10 DIAGNOSIS — R011 Cardiac murmur, unspecified: Secondary | ICD-10-CM | POA: Diagnosis not present

## 2020-09-10 DIAGNOSIS — I509 Heart failure, unspecified: Secondary | ICD-10-CM

## 2020-09-10 DIAGNOSIS — E871 Hypo-osmolality and hyponatremia: Secondary | ICD-10-CM | POA: Diagnosis not present

## 2020-09-10 DIAGNOSIS — I110 Hypertensive heart disease with heart failure: Secondary | ICD-10-CM | POA: Diagnosis not present

## 2020-09-10 DIAGNOSIS — I10 Essential (primary) hypertension: Secondary | ICD-10-CM

## 2020-09-10 DIAGNOSIS — S22050D Wedge compression fracture of T5-T6 vertebra, subsequent encounter for fracture with routine healing: Secondary | ICD-10-CM

## 2020-09-10 DIAGNOSIS — R531 Weakness: Secondary | ICD-10-CM

## 2020-09-10 DIAGNOSIS — I2510 Atherosclerotic heart disease of native coronary artery without angina pectoris: Secondary | ICD-10-CM | POA: Diagnosis not present

## 2020-09-10 NOTE — Progress Notes (Signed)
I,Teresa Moyer,acting as a scribe for Teresa Durie, MD.,have documented all relevant documentation on the behalf of Teresa Durie, MD,as directed by  Teresa Durie, MD while in the presence of Teresa Durie, MD.  Established patient visit   Patient: Teresa Moyer   DOB: April 01, 1936   85 y.o. Female  MRN: [id number] Visit Date: 09/10/2020  Today's healthcare provider: [name], MD   Chief Complaint  Patient presents with  . Hospitalization Follow-up   Subjective    HPI  Follow up Hospitalization  Patient was admitted to [facility] on 08/31/2020 and discharged on 09/05/2020.  Patient slowly feeling better.  She is still weak improving.  She is following daily weights. She was treated for; Acute hyponatremia, Nausea and vomiting. Treatment for this included; see notes in chart. Telephone follow up was done on none She reports good compliance with treatment. She reports this condition is stayed the same.  --------------------------------------------------------------------  Patient states she does not feel much improved since hospital discharge. She is still slightly weak.      Medications: Outpatient Medications Prior to Visit  Medication Sig  . acetaminophen (TYLENOL) 325 MG tablet Take 650 mg by mouth every 6 (six) hours as needed.  Marland Kitchen atorvastatin (LIPITOR) 40 MG tablet Take 1 tablet (40 mg total) by mouth daily at 6 PM.  . azelastine (ASTELIN) 0.1 % nasal spray Place 1 spray into both nostrils 2 (two) times daily.  . Carboxymethylcell-Hypromellose (GENTEAL) 0.25-0.3 % GEL Apply 1 application to eye at bedtime.   . carvedilol (COREG) 12.5 MG tablet Take 12.5 mg by mouth 2 (two) times daily with a meal.   . cefdinir (OMNICEF) 300 MG capsule Take 2 capsules (600 mg total) by mouth daily for 5 days.  . cetirizine (ZYRTEC) 10 MG tablet Take 10 mg by mouth at bedtime.   . fluticasone (FLONASE) 50 MCG/ACT nasal spray Place 1 spray into both nostrils  daily.   . metolazone (ZAROXOLYN) 2.5 MG tablet Take 1 tablet (2.5 mg total) by mouth daily. 1 pill each morning for 5 days then hold until further evaluation for CHF  . Multiple Vitamins-Minerals (PRESERVISION AREDS PO) Take 1 tablet by mouth 2 (two) times daily.   Marland Kitchen omeprazole (PRILOSEC) 20 MG capsule TAKE 1 CAPSULE BY MOUTH ONCE DAILY (Patient taking differently: Take 20 mg by mouth every morning.)  . ondansetron (ZOFRAN-ODT) 4 MG disintegrating tablet Take 1 tablet (4 mg total) by mouth every 8 (eight) hours as needed for nausea or vomiting.  . potassium chloride (KLOR-CON) 10 MEQ tablet TAKE 1 TABLET BY MOUTH TWICE DAILY (Patient taking differently: Take 20 mEq by mouth 2 (two) times daily.)  . Probiotic Product (ALIGN) 4 MG CAPS Take 8 mg by mouth daily after lunch.  . sacubitril-valsartan (ENTRESTO) 49-51 MG Take by mouth 2 (two) times daily.   . sodium chloride 1 g tablet Take 1 tablet (1 g total) by mouth 2 (two) times daily with a meal for 7 days.  . traMADol (ULTRAM) 50 MG tablet Take 0.5 tablets (25 mg total) by mouth every 6 (six) hours as needed for moderate pain. Take 1/2 tablet by mouth every 4 hours PRN, moderate pain  . furosemide (LASIX) 40 MG tablet TAKE 1 TABLET BY MOUTH TWICE DAILY (Patient not taking: Reported on 09/10/2020)   No facility-administered medications prior to visit.    Review of Systems  Constitutional: Negative for appetite change, chills, fatigue and fever.  Respiratory: Negative  for chest tightness and shortness of breath.   Cardiovascular: Negative for chest pain and palpitations.  Gastrointestinal: Negative for abdominal pain, nausea and vomiting.  Neurological: Negative for dizziness and weakness.        Objective    BP (!) 170/81 (BP Location: Left Arm, Patient Position: Sitting, Cuff Size: Normal)   Pulse 80   Temp 98.5 F (36.9 C) (Oral)   Resp 18   Wt 149 lb (67.6 kg)   SpO2 98%   BMI 27.25 kg/m  BP Readings from Last 3 Encounters:   09/10/20 (!) 170/81  09/05/20 (!) 146/65  08/26/20 (!) 154/79   Wt Readings from Last 3 Encounters:  09/10/20 149 lb (67.6 kg)  09/05/20 142 lb 3.2 oz (64.5 kg)  08/26/20 143 lb (64.9 kg)       Physical Exam Vitals reviewed.  Constitutional:      Appearance: Normal appearance.  HENT:     Head: Normocephalic and atraumatic.     Right Ear: External ear normal.     Left Ear: External ear normal.  Eyes:     General: No scleral icterus.    Conjunctiva/sclera: Conjunctivae normal.  Cardiovascular:     Rate and Rhythm: Normal rate and regular rhythm.     Pulses: Normal pulses.     Heart sounds: Normal heart sounds.  Pulmonary:     Breath sounds: Normal breath sounds.  Abdominal:     Palpations: Abdomen is soft.  Musculoskeletal:     Comments: 1+ edema.   Skin:    General: Skin is warm and dry.  Neurological:     Mental Status: She is alert and oriented to person, place, and time. Mental status is at baseline.  Psychiatric:        Mood and Affect: Mood normal.        Behavior: Behavior normal.        Thought Content: Thought content normal.        Judgment: Judgment normal.       No results found for any visits on 09/10/20.  Assessment & Plan     1. Congestive heart failure, unspecified HF chronicity, unspecified heart failure type ([place]) On Entresto.  Patient with the recent weight gain over the past couple of days.  Will take 2 furosemide a day for the next 3 days.  Try to avoid colas him due to hypokalemia - CBC w/Diff/Platelet - Renal function panel  2. Hypokalemia Check labs.  With metolazone - CBC w/Diff/Platelet - Renal function panel  3. Hyponatremia  - CBC w/Diff/Platelet - Renal function panel  4. Leukocytosis, unspecified type  - CBC w/Diff/Platelet - Renal function panel  5. Chronic combined systolic and diastolic heart failure ([place])   6. Essential (primary) hypertension Elevated today, will follow over time.  7. Weakness Type  factorial.  8. Compression fracture of T6 vertebra with routine healing, subsequent encounter Back pain from compression fracture is basically resolved.   No follow-ups on file.      I, Teresa Durie, MD, have reviewed all documentation for this visit. The documentation on 09/22/20 for the exam, diagnosis, procedures, and orders are all accurate and complete.    Ilina Xu Cranford Mon, MD  [facility] [phone] (phone) [phone] (fax)  [place]

## 2020-09-10 NOTE — Telephone Encounter (Signed)
Verbal okay given.  

## 2020-09-10 NOTE — Telephone Encounter (Signed)
Copied from [place] [phone]. Topic: Quick Communication - Home Health Verbal Orders >> Sep 10, 2020  9:42 AM Tessa Lerner A wrote: Caller/Agency: Sherlynn Stalls / [place] Number: [phone]  Requesting OT/PT/Skilled Nursing/Social Work/Speech Therapy: OT  Frequency: 2w2

## 2020-09-10 NOTE — Telephone Encounter (Signed)
ok 

## 2020-09-10 NOTE — Telephone Encounter (Signed)
Home Health Verbal Orders - Caller/Agency: [place] Number: [phone]  Requesting OT/PT/Skilled Nursing/Social Work/Speech Therapy: Nursing orders, Social Worker elav. and home health aid orders  Nursing 1 w 1, 2 w 2 , 1 w 6 and 2 prns  Home health aid 2 w 4 and 1 w 3.

## 2020-09-10 NOTE — Patient Instructions (Signed)
Take 3 furosemide for three days.

## 2020-09-11 ENCOUNTER — Telehealth: Payer: Self-pay

## 2020-09-11 DIAGNOSIS — R011 Cardiac murmur, unspecified: Secondary | ICD-10-CM | POA: Diagnosis not present

## 2020-09-11 DIAGNOSIS — I110 Hypertensive heart disease with heart failure: Secondary | ICD-10-CM | POA: Diagnosis not present

## 2020-09-11 DIAGNOSIS — E871 Hypo-osmolality and hyponatremia: Secondary | ICD-10-CM | POA: Diagnosis not present

## 2020-09-11 DIAGNOSIS — I5022 Chronic systolic (congestive) heart failure: Secondary | ICD-10-CM | POA: Diagnosis not present

## 2020-09-11 DIAGNOSIS — I2510 Atherosclerotic heart disease of native coronary artery without angina pectoris: Secondary | ICD-10-CM | POA: Diagnosis not present

## 2020-09-11 DIAGNOSIS — I252 Old myocardial infarction: Secondary | ICD-10-CM | POA: Diagnosis not present

## 2020-09-11 NOTE — Telephone Encounter (Signed)
OK for verbals 

## 2020-09-11 NOTE — Telephone Encounter (Signed)
Elder Cyphers advised as below.

## 2020-09-11 NOTE — Telephone Encounter (Signed)
Copied from [place] [phone]. Topic: Quick Communication - Home Health Verbal Orders >> Sep 10, 2020  4:15 PM [name], Brayton Layman A wrote: Caller/Agency: [place] Number: [phone] - can leave message, secure VM Requesting: PT Frequency: 1x1, 2x4, 1x4

## 2020-09-12 ENCOUNTER — Telehealth: Payer: Self-pay | Admitting: Family Medicine

## 2020-09-12 DIAGNOSIS — H353231 Exudative age-related macular degeneration, bilateral, with active choroidal neovascularization: Secondary | ICD-10-CM | POA: Diagnosis not present

## 2020-09-12 NOTE — Telephone Encounter (Signed)
Home Health Verbal Orders - [place]ori/Agency: [facility] Callback Number:[phone] /Skilled Nursing at 1W 1  2W 2  1W 6 and 2 PRN Also a Home Health Aid at [place] and 1W 3

## 2020-09-13 DIAGNOSIS — I252 Old myocardial infarction: Secondary | ICD-10-CM | POA: Diagnosis not present

## 2020-09-13 DIAGNOSIS — E871 Hypo-osmolality and hyponatremia: Secondary | ICD-10-CM | POA: Diagnosis not present

## 2020-09-13 DIAGNOSIS — R011 Cardiac murmur, unspecified: Secondary | ICD-10-CM | POA: Diagnosis not present

## 2020-09-13 DIAGNOSIS — I110 Hypertensive heart disease with heart failure: Secondary | ICD-10-CM | POA: Diagnosis not present

## 2020-09-13 DIAGNOSIS — I2510 Atherosclerotic heart disease of native coronary artery without angina pectoris: Secondary | ICD-10-CM | POA: Diagnosis not present

## 2020-09-13 DIAGNOSIS — I5022 Chronic systolic (congestive) heart failure: Secondary | ICD-10-CM | POA: Diagnosis not present

## 2020-09-14 DIAGNOSIS — R011 Cardiac murmur, unspecified: Secondary | ICD-10-CM | POA: Diagnosis not present

## 2020-09-14 DIAGNOSIS — E871 Hypo-osmolality and hyponatremia: Secondary | ICD-10-CM | POA: Diagnosis not present

## 2020-09-14 DIAGNOSIS — I110 Hypertensive heart disease with heart failure: Secondary | ICD-10-CM | POA: Diagnosis not present

## 2020-09-14 DIAGNOSIS — I2510 Atherosclerotic heart disease of native coronary artery without angina pectoris: Secondary | ICD-10-CM | POA: Diagnosis not present

## 2020-09-14 DIAGNOSIS — I5022 Chronic systolic (congestive) heart failure: Secondary | ICD-10-CM | POA: Diagnosis not present

## 2020-09-14 DIAGNOSIS — I252 Old myocardial infarction: Secondary | ICD-10-CM | POA: Diagnosis not present

## 2020-09-16 ENCOUNTER — Telehealth: Payer: Self-pay | Admitting: Family Medicine

## 2020-09-16 DIAGNOSIS — I110 Hypertensive heart disease with heart failure: Secondary | ICD-10-CM | POA: Diagnosis not present

## 2020-09-16 DIAGNOSIS — R300 Dysuria: Secondary | ICD-10-CM

## 2020-09-16 DIAGNOSIS — I509 Heart failure, unspecified: Secondary | ICD-10-CM | POA: Diagnosis not present

## 2020-09-16 DIAGNOSIS — I252 Old myocardial infarction: Secondary | ICD-10-CM | POA: Diagnosis not present

## 2020-09-16 DIAGNOSIS — I5022 Chronic systolic (congestive) heart failure: Secondary | ICD-10-CM | POA: Diagnosis not present

## 2020-09-16 DIAGNOSIS — I2510 Atherosclerotic heart disease of native coronary artery without angina pectoris: Secondary | ICD-10-CM | POA: Diagnosis not present

## 2020-09-16 DIAGNOSIS — D72829 Elevated white blood cell count, unspecified: Secondary | ICD-10-CM | POA: Diagnosis not present

## 2020-09-16 DIAGNOSIS — E876 Hypokalemia: Secondary | ICD-10-CM | POA: Diagnosis not present

## 2020-09-16 DIAGNOSIS — E871 Hypo-osmolality and hyponatremia: Secondary | ICD-10-CM | POA: Diagnosis not present

## 2020-09-16 DIAGNOSIS — R011 Cardiac murmur, unspecified: Secondary | ICD-10-CM | POA: Diagnosis not present

## 2020-09-16 NOTE — Telephone Encounter (Signed)
Verbal okay given.  

## 2020-09-16 NOTE — Telephone Encounter (Signed)
Please advise for Dr. Rosanna Randy.

## 2020-09-16 NOTE — Telephone Encounter (Signed)
Patient dropped off urine

## 2020-09-16 NOTE — Telephone Encounter (Signed)
Pt is calling to report cefdinir 300mg  2times a day for 5 days. Pt reports that the UTI has not cleared up. She will be coming to the lab today at 10:00am. And would like to know is a urinalysis needed? Please advise CB- [phone]

## 2020-09-16 NOTE — Telephone Encounter (Signed)
Yes lets add a urinalysis and culture. I have ordered through [place]

## 2020-09-17 ENCOUNTER — Telehealth: Payer: Self-pay

## 2020-09-17 DIAGNOSIS — I110 Hypertensive heart disease with heart failure: Secondary | ICD-10-CM | POA: Diagnosis not present

## 2020-09-17 DIAGNOSIS — I5022 Chronic systolic (congestive) heart failure: Secondary | ICD-10-CM | POA: Diagnosis not present

## 2020-09-17 DIAGNOSIS — R011 Cardiac murmur, unspecified: Secondary | ICD-10-CM | POA: Diagnosis not present

## 2020-09-17 DIAGNOSIS — I252 Old myocardial infarction: Secondary | ICD-10-CM | POA: Diagnosis not present

## 2020-09-17 DIAGNOSIS — E871 Hypo-osmolality and hyponatremia: Secondary | ICD-10-CM | POA: Diagnosis not present

## 2020-09-17 DIAGNOSIS — I2510 Atherosclerotic heart disease of native coronary artery without angina pectoris: Secondary | ICD-10-CM | POA: Diagnosis not present

## 2020-09-17 NOTE — Telephone Encounter (Signed)
Copied from [place] [phone]. Topic: General - Other >> Sep 17, 2020  1:11 PM Mcneil, Ja-Kwan wrote: Reason for CRM: Pt stated she provided urine sample and she would like for a message to be sent to Dr. Rosanna Randy informing him that it is urgent that she get the medication asap. Pt stated she has to have someone go to the pharmacy for her so she will need for it to be sent in.

## 2020-09-17 NOTE — Telephone Encounter (Signed)
Copied from [place] [phone]. Topic: General - Other >> Sep 17, 2020  1:11 PM Mcneil, Ja-Kwan wrote: Reason for CRM: Pt stated she provided urine sample and she would like for a message to be sent to Dr. Rosanna Randy informing him that it is urgent that she get the medication asap. Pt stated she has to have someone go to the pharmacy for her so she will need for it to be sent in.

## 2020-09-18 ENCOUNTER — Telehealth: Payer: Self-pay

## 2020-09-18 ENCOUNTER — Other Ambulatory Visit: Payer: Self-pay | Admitting: Physician Assistant

## 2020-09-18 MED ORDER — NITROFURANTOIN MONOHYD MACRO 100 MG PO CAPS: 100.0000 mg | ORAL_CAPSULE | Freq: Two times a day (BID) | ORAL | 0 refills | Status: DC

## 2020-09-18 NOTE — Telephone Encounter (Signed)
-----   Message from Mar Daring, PA-C sent at 09/18/2020 11:59 AM EDT ----- Labs appear to be improving. Urine culture is pending. Will send in [place] for possible UTI

## 2020-09-18 NOTE — Telephone Encounter (Signed)
Patient was advised and reports she already picked up medication from pharmacy.

## 2020-09-18 NOTE — Telephone Encounter (Signed)
Can you review her labs ordered by Dr Rosanna Randy? Thanks I will go in and forward them to you    Copied from [place] [phone]. Topic: General - Other >> Sep 18, 2020 11:28 AM [name], Brayton Layman A wrote: Reason for CRM: Pt wanted to know the results of her lab results, stated she really needs some medication for her UTI. Pt is homebound and states she needs a call back because she has to arrange a ride to go pick up her medicine. Please call back.

## 2020-09-18 NOTE — Telephone Encounter (Signed)
Macrobid sent in and results sent through [facility]

## 2020-09-18 NOTE — Telephone Encounter (Signed)
Patient was advised via another message. 

## 2020-09-19 DIAGNOSIS — R011 Cardiac murmur, unspecified: Secondary | ICD-10-CM | POA: Diagnosis not present

## 2020-09-19 DIAGNOSIS — E871 Hypo-osmolality and hyponatremia: Secondary | ICD-10-CM | POA: Diagnosis not present

## 2020-09-19 DIAGNOSIS — I2510 Atherosclerotic heart disease of native coronary artery without angina pectoris: Secondary | ICD-10-CM | POA: Diagnosis not present

## 2020-09-19 DIAGNOSIS — I5022 Chronic systolic (congestive) heart failure: Secondary | ICD-10-CM | POA: Diagnosis not present

## 2020-09-19 DIAGNOSIS — I110 Hypertensive heart disease with heart failure: Secondary | ICD-10-CM | POA: Diagnosis not present

## 2020-09-19 DIAGNOSIS — I252 Old myocardial infarction: Secondary | ICD-10-CM | POA: Diagnosis not present

## 2020-09-19 LAB — CBC WITH DIFFERENTIAL/PLATELET
Basophils Absolute: 0.1 10*3/uL (ref 0.0–0.2)
Basos: 1 %
EOS (ABSOLUTE): 0.2 10*3/uL (ref 0.0–0.4)
Eos: 1 %
Hematocrit: 31.9 % — ABNORMAL LOW (ref 34.0–46.6)
Hemoglobin: 10.8 g/dL — ABNORMAL LOW (ref 11.1–15.9)
Immature Grans (Abs): 0 10*3/uL (ref 0.0–0.1)
Immature Granulocytes: 0 %
Lymphocytes Absolute: 3.8 10*3/uL — ABNORMAL HIGH (ref 0.7–3.1)
Lymphs: 33 %
MCH: 31.5 pg (ref 26.6–33.0)
MCHC: 33.9 g/dL (ref 31.5–35.7)
MCV: 93 fL (ref 79–97)
Monocytes Absolute: 0.7 10*3/uL (ref 0.1–0.9)
Monocytes: 6 %
Neutrophils Absolute: 6.7 10*3/uL (ref 1.4–7.0)
Neutrophils: 59 %
Platelets: 289 10*3/uL (ref 150–450)
RBC: 3.43 x10E6/uL — ABNORMAL LOW (ref 3.77–5.28)
RDW: 15.1 % (ref 11.7–15.4)
WBC: 11.5 10*3/uL — ABNORMAL HIGH (ref 3.4–10.8)

## 2020-09-19 LAB — RENAL FUNCTION PANEL
Albumin: 3.9 g/dL (ref 3.6–4.6)
BUN/Creatinine Ratio: 18 (ref 12–28)
BUN: 13 mg/dL (ref 8–27)
CO2: 20 mmol/L (ref 20–29)
Calcium: 8.6 mg/dL — ABNORMAL LOW (ref 8.7–10.3)
Chloride: 95 mmol/L — ABNORMAL LOW (ref 96–106)
Creatinine, Ser: 0.74 mg/dL (ref 0.57–1.00)
Glucose: 105 mg/dL — ABNORMAL HIGH (ref 65–99)
Phosphorus: 3.7 mg/dL (ref 3.0–4.3)
Potassium: 4.3 mmol/L (ref 3.5–5.2)
Sodium: 133 mmol/L — ABNORMAL LOW (ref 134–144)
eGFR: 81 mL/min/{1.73_m2} (ref >59)

## 2020-09-19 LAB — URINALYSIS, ROUTINE W REFLEX MICROSCOPIC

## 2020-09-19 LAB — URINE CULTURE: Organism ID, Bacteria: NO GROWTH

## 2020-09-20 DIAGNOSIS — I110 Hypertensive heart disease with heart failure: Secondary | ICD-10-CM | POA: Diagnosis not present

## 2020-09-20 DIAGNOSIS — I5022 Chronic systolic (congestive) heart failure: Secondary | ICD-10-CM | POA: Diagnosis not present

## 2020-09-20 DIAGNOSIS — I252 Old myocardial infarction: Secondary | ICD-10-CM | POA: Diagnosis not present

## 2020-09-20 DIAGNOSIS — E871 Hypo-osmolality and hyponatremia: Secondary | ICD-10-CM | POA: Diagnosis not present

## 2020-09-20 DIAGNOSIS — I2510 Atherosclerotic heart disease of native coronary artery without angina pectoris: Secondary | ICD-10-CM | POA: Diagnosis not present

## 2020-09-20 DIAGNOSIS — R011 Cardiac murmur, unspecified: Secondary | ICD-10-CM | POA: Diagnosis not present

## 2020-09-21 DIAGNOSIS — E871 Hypo-osmolality and hyponatremia: Secondary | ICD-10-CM | POA: Diagnosis not present

## 2020-09-21 DIAGNOSIS — I110 Hypertensive heart disease with heart failure: Secondary | ICD-10-CM | POA: Diagnosis not present

## 2020-09-21 DIAGNOSIS — I5022 Chronic systolic (congestive) heart failure: Secondary | ICD-10-CM | POA: Diagnosis not present

## 2020-09-21 DIAGNOSIS — I2510 Atherosclerotic heart disease of native coronary artery without angina pectoris: Secondary | ICD-10-CM | POA: Diagnosis not present

## 2020-09-21 DIAGNOSIS — I252 Old myocardial infarction: Secondary | ICD-10-CM | POA: Diagnosis not present

## 2020-09-21 DIAGNOSIS — R011 Cardiac murmur, unspecified: Secondary | ICD-10-CM | POA: Diagnosis not present

## 2020-09-23 ENCOUNTER — Telehealth: Payer: Self-pay

## 2020-09-23 DIAGNOSIS — E871 Hypo-osmolality and hyponatremia: Secondary | ICD-10-CM | POA: Diagnosis not present

## 2020-09-23 DIAGNOSIS — I110 Hypertensive heart disease with heart failure: Secondary | ICD-10-CM | POA: Diagnosis not present

## 2020-09-23 DIAGNOSIS — I2510 Atherosclerotic heart disease of native coronary artery without angina pectoris: Secondary | ICD-10-CM | POA: Diagnosis not present

## 2020-09-23 DIAGNOSIS — I252 Old myocardial infarction: Secondary | ICD-10-CM | POA: Diagnosis not present

## 2020-09-23 DIAGNOSIS — R011 Cardiac murmur, unspecified: Secondary | ICD-10-CM | POA: Diagnosis not present

## 2020-09-23 DIAGNOSIS — I5022 Chronic systolic (congestive) heart failure: Secondary | ICD-10-CM | POA: Diagnosis not present

## 2020-09-23 NOTE — Telephone Encounter (Signed)
Verbal okay was given. 

## 2020-09-23 NOTE — Telephone Encounter (Signed)
Copied from [place] [phone]. Topic: Quick Communication - Home Health Verbal Orders >> Sep 23, 2020  3:10 PM [name], Apolonio Schneiders wrote: Callback Number: HomeHealth [phone] Requesting: OT Frequency: 1 week 2

## 2020-09-25 ENCOUNTER — Ambulatory Visit: Payer: Medicare Other | Admitting: Family Medicine

## 2020-09-25 DIAGNOSIS — I252 Old myocardial infarction: Secondary | ICD-10-CM | POA: Diagnosis not present

## 2020-09-25 DIAGNOSIS — I2510 Atherosclerotic heart disease of native coronary artery without angina pectoris: Secondary | ICD-10-CM | POA: Diagnosis not present

## 2020-09-25 DIAGNOSIS — I5022 Chronic systolic (congestive) heart failure: Secondary | ICD-10-CM | POA: Diagnosis not present

## 2020-09-25 DIAGNOSIS — R011 Cardiac murmur, unspecified: Secondary | ICD-10-CM | POA: Diagnosis not present

## 2020-09-25 DIAGNOSIS — I110 Hypertensive heart disease with heart failure: Secondary | ICD-10-CM | POA: Diagnosis not present

## 2020-09-25 DIAGNOSIS — E871 Hypo-osmolality and hyponatremia: Secondary | ICD-10-CM | POA: Diagnosis not present

## 2020-09-26 ENCOUNTER — Telehealth: Payer: Self-pay

## 2020-09-26 DIAGNOSIS — E871 Hypo-osmolality and hyponatremia: Secondary | ICD-10-CM | POA: Diagnosis not present

## 2020-09-26 DIAGNOSIS — R011 Cardiac murmur, unspecified: Secondary | ICD-10-CM | POA: Diagnosis not present

## 2020-09-26 DIAGNOSIS — I252 Old myocardial infarction: Secondary | ICD-10-CM | POA: Diagnosis not present

## 2020-09-26 DIAGNOSIS — I110 Hypertensive heart disease with heart failure: Secondary | ICD-10-CM | POA: Diagnosis not present

## 2020-09-26 DIAGNOSIS — I2510 Atherosclerotic heart disease of native coronary artery without angina pectoris: Secondary | ICD-10-CM | POA: Diagnosis not present

## 2020-09-26 DIAGNOSIS — I5022 Chronic systolic (congestive) heart failure: Secondary | ICD-10-CM | POA: Diagnosis not present

## 2020-09-26 MED ORDER — FLUCONAZOLE 150 MG PO TABS: 150.0000 mg | ORAL_TABLET | Freq: Once | ORAL | 1 refills | Status: AC

## 2020-09-26 NOTE — Telephone Encounter (Signed)
Copied from [place] [phone]. Topic: General - Other >> Sep 26, 2020 10:26 AM Leward Quan A wrote: Reason for CRM: Patient called in to inform Dr Rosanna Randy that she have a yeast infection from the antibiotics and need an Rx sent to her Pharmacy please today so things don't get worst over the holiday. Would like a call when sent to pharmacy please  Ph# [phone]

## 2020-09-26 NOTE — Telephone Encounter (Signed)
Diflucan 150 mg daily for 2 days.

## 2020-09-26 NOTE — Telephone Encounter (Signed)
Rx sent to pharmacy   

## 2020-09-26 NOTE — Telephone Encounter (Signed)
Please review. Thanks!  

## 2020-09-30 ENCOUNTER — Other Ambulatory Visit: Payer: Self-pay | Admitting: Family Medicine

## 2020-09-30 DIAGNOSIS — I110 Hypertensive heart disease with heart failure: Secondary | ICD-10-CM | POA: Diagnosis not present

## 2020-09-30 DIAGNOSIS — I2510 Atherosclerotic heart disease of native coronary artery without angina pectoris: Secondary | ICD-10-CM | POA: Diagnosis not present

## 2020-09-30 DIAGNOSIS — I252 Old myocardial infarction: Secondary | ICD-10-CM | POA: Diagnosis not present

## 2020-09-30 DIAGNOSIS — I5022 Chronic systolic (congestive) heart failure: Secondary | ICD-10-CM | POA: Diagnosis not present

## 2020-09-30 DIAGNOSIS — K219 Gastro-esophageal reflux disease without esophagitis: Secondary | ICD-10-CM

## 2020-09-30 DIAGNOSIS — E871 Hypo-osmolality and hyponatremia: Secondary | ICD-10-CM | POA: Diagnosis not present

## 2020-09-30 DIAGNOSIS — R011 Cardiac murmur, unspecified: Secondary | ICD-10-CM | POA: Diagnosis not present

## 2020-10-01 DIAGNOSIS — E871 Hypo-osmolality and hyponatremia: Secondary | ICD-10-CM | POA: Diagnosis not present

## 2020-10-01 DIAGNOSIS — I5022 Chronic systolic (congestive) heart failure: Secondary | ICD-10-CM | POA: Diagnosis not present

## 2020-10-01 DIAGNOSIS — I252 Old myocardial infarction: Secondary | ICD-10-CM | POA: Diagnosis not present

## 2020-10-01 DIAGNOSIS — I110 Hypertensive heart disease with heart failure: Secondary | ICD-10-CM | POA: Diagnosis not present

## 2020-10-01 DIAGNOSIS — I2510 Atherosclerotic heart disease of native coronary artery without angina pectoris: Secondary | ICD-10-CM | POA: Diagnosis not present

## 2020-10-01 DIAGNOSIS — R011 Cardiac murmur, unspecified: Secondary | ICD-10-CM | POA: Diagnosis not present

## 2020-10-02 DIAGNOSIS — I110 Hypertensive heart disease with heart failure: Secondary | ICD-10-CM | POA: Diagnosis not present

## 2020-10-02 DIAGNOSIS — I252 Old myocardial infarction: Secondary | ICD-10-CM | POA: Diagnosis not present

## 2020-10-02 DIAGNOSIS — I2510 Atherosclerotic heart disease of native coronary artery without angina pectoris: Secondary | ICD-10-CM | POA: Diagnosis not present

## 2020-10-02 DIAGNOSIS — I5022 Chronic systolic (congestive) heart failure: Secondary | ICD-10-CM | POA: Diagnosis not present

## 2020-10-02 DIAGNOSIS — R011 Cardiac murmur, unspecified: Secondary | ICD-10-CM | POA: Diagnosis not present

## 2020-10-02 DIAGNOSIS — E871 Hypo-osmolality and hyponatremia: Secondary | ICD-10-CM | POA: Diagnosis not present

## 2020-10-07 DIAGNOSIS — N3289 Other specified disorders of bladder: Secondary | ICD-10-CM | POA: Diagnosis not present

## 2020-10-07 DIAGNOSIS — Z950 Presence of cardiac pacemaker: Secondary | ICD-10-CM | POA: Diagnosis not present

## 2020-10-07 DIAGNOSIS — R778 Other specified abnormalities of plasma proteins: Secondary | ICD-10-CM | POA: Diagnosis not present

## 2020-10-07 DIAGNOSIS — H3530 Unspecified macular degeneration: Secondary | ICD-10-CM | POA: Diagnosis not present

## 2020-10-07 DIAGNOSIS — Z85828 Personal history of other malignant neoplasm of skin: Secondary | ICD-10-CM | POA: Diagnosis not present

## 2020-10-07 DIAGNOSIS — Z90710 Acquired absence of both cervix and uterus: Secondary | ICD-10-CM | POA: Diagnosis not present

## 2020-10-07 DIAGNOSIS — Z9089 Acquired absence of other organs: Secondary | ICD-10-CM | POA: Diagnosis not present

## 2020-10-07 DIAGNOSIS — E782 Mixed hyperlipidemia: Secondary | ICD-10-CM | POA: Diagnosis not present

## 2020-10-07 DIAGNOSIS — E871 Hypo-osmolality and hyponatremia: Secondary | ICD-10-CM | POA: Diagnosis not present

## 2020-10-07 DIAGNOSIS — I5022 Chronic systolic (congestive) heart failure: Secondary | ICD-10-CM | POA: Diagnosis not present

## 2020-10-07 DIAGNOSIS — I10 Essential (primary) hypertension: Secondary | ICD-10-CM | POA: Diagnosis not present

## 2020-10-07 DIAGNOSIS — R011 Cardiac murmur, unspecified: Secondary | ICD-10-CM | POA: Diagnosis not present

## 2020-10-07 DIAGNOSIS — R1310 Dysphagia, unspecified: Secondary | ICD-10-CM | POA: Diagnosis not present

## 2020-10-07 DIAGNOSIS — Z7982 Long term (current) use of aspirin: Secondary | ICD-10-CM | POA: Diagnosis not present

## 2020-10-07 DIAGNOSIS — E878 Other disorders of electrolyte and fluid balance, not elsewhere classified: Secondary | ICD-10-CM | POA: Diagnosis not present

## 2020-10-07 DIAGNOSIS — I350 Nonrheumatic aortic (valve) stenosis: Secondary | ICD-10-CM | POA: Diagnosis not present

## 2020-10-07 DIAGNOSIS — Z7902 Long term (current) use of antithrombotics/antiplatelets: Secondary | ICD-10-CM | POA: Diagnosis not present

## 2020-10-07 DIAGNOSIS — N1330 Unspecified hydronephrosis: Secondary | ICD-10-CM | POA: Diagnosis not present

## 2020-10-07 DIAGNOSIS — M1990 Unspecified osteoarthritis, unspecified site: Secondary | ICD-10-CM | POA: Diagnosis not present

## 2020-10-07 DIAGNOSIS — I110 Hypertensive heart disease with heart failure: Secondary | ICD-10-CM | POA: Diagnosis not present

## 2020-10-07 DIAGNOSIS — K219 Gastro-esophageal reflux disease without esophagitis: Secondary | ICD-10-CM | POA: Diagnosis not present

## 2020-10-07 DIAGNOSIS — R32 Unspecified urinary incontinence: Secondary | ICD-10-CM | POA: Diagnosis not present

## 2020-10-07 DIAGNOSIS — H269 Unspecified cataract: Secondary | ICD-10-CM | POA: Diagnosis not present

## 2020-10-07 DIAGNOSIS — K7460 Unspecified cirrhosis of liver: Secondary | ICD-10-CM | POA: Diagnosis not present

## 2020-10-07 DIAGNOSIS — E876 Hypokalemia: Secondary | ICD-10-CM | POA: Diagnosis not present

## 2020-10-07 DIAGNOSIS — Z79891 Long term (current) use of opiate analgesic: Secondary | ICD-10-CM | POA: Diagnosis not present

## 2020-10-07 DIAGNOSIS — Z9049 Acquired absence of other specified parts of digestive tract: Secondary | ICD-10-CM | POA: Diagnosis not present

## 2020-10-07 DIAGNOSIS — I2510 Atherosclerotic heart disease of native coronary artery without angina pectoris: Secondary | ICD-10-CM | POA: Diagnosis not present

## 2020-10-07 DIAGNOSIS — I252 Old myocardial infarction: Secondary | ICD-10-CM | POA: Diagnosis not present

## 2020-10-08 ENCOUNTER — Telehealth (INDEPENDENT_AMBULATORY_CARE_PROVIDER_SITE_OTHER): Payer: Medicare Other

## 2020-10-08 DIAGNOSIS — I5022 Chronic systolic (congestive) heart failure: Secondary | ICD-10-CM | POA: Diagnosis not present

## 2020-10-08 DIAGNOSIS — I110 Hypertensive heart disease with heart failure: Secondary | ICD-10-CM | POA: Diagnosis not present

## 2020-10-08 DIAGNOSIS — E871 Hypo-osmolality and hyponatremia: Secondary | ICD-10-CM | POA: Diagnosis not present

## 2020-10-08 DIAGNOSIS — R300 Dysuria: Secondary | ICD-10-CM | POA: Diagnosis not present

## 2020-10-08 DIAGNOSIS — R011 Cardiac murmur, unspecified: Secondary | ICD-10-CM | POA: Diagnosis not present

## 2020-10-08 DIAGNOSIS — I2510 Atherosclerotic heart disease of native coronary artery without angina pectoris: Secondary | ICD-10-CM | POA: Diagnosis not present

## 2020-10-08 DIAGNOSIS — I252 Old myocardial infarction: Secondary | ICD-10-CM | POA: Diagnosis not present

## 2020-10-08 NOTE — Telephone Encounter (Signed)
Patient was prescribed Diflucan 150 MG for 2 days on 09/26/20. Please advise.

## 2020-10-08 NOTE — Telephone Encounter (Signed)
Copied from [place] [phone]. Topic: General - Other >> Oct 08, 2020  1:27 PM Keene Breath wrote: Reason for CRM: Patient called to inform the doctor that the medication she has been taking for her UTI is not working well.  She stated that she is still having burning and an odor.  Patient would like advice on what she can do.  Please call [phone]

## 2020-10-09 DIAGNOSIS — I2510 Atherosclerotic heart disease of native coronary artery without angina pectoris: Secondary | ICD-10-CM | POA: Diagnosis not present

## 2020-10-09 DIAGNOSIS — E871 Hypo-osmolality and hyponatremia: Secondary | ICD-10-CM | POA: Diagnosis not present

## 2020-10-09 DIAGNOSIS — I252 Old myocardial infarction: Secondary | ICD-10-CM | POA: Diagnosis not present

## 2020-10-09 DIAGNOSIS — I5022 Chronic systolic (congestive) heart failure: Secondary | ICD-10-CM | POA: Diagnosis not present

## 2020-10-09 DIAGNOSIS — I110 Hypertensive heart disease with heart failure: Secondary | ICD-10-CM | POA: Diagnosis not present

## 2020-10-09 DIAGNOSIS — R011 Cardiac murmur, unspecified: Secondary | ICD-10-CM | POA: Diagnosis not present

## 2020-10-09 LAB — POCT URINALYSIS DIPSTICK
Bilirubin, UA: NEGATIVE
Glucose, UA: NEGATIVE
Ketones, UA: NEGATIVE
Nitrite, UA: POSITIVE
Protein, UA: POSITIVE — AB
Spec Grav, UA: 1.01 (ref 1.010–1.025)
Urobilinogen, UA: 0.2 E.U./dL
pH, UA: 8.5 — AB (ref 5.0–8.0)

## 2020-10-09 NOTE — Telephone Encounter (Signed)
Please check urine Cand S and UA

## 2020-10-09 NOTE — Telephone Encounter (Signed)
Urine sample was dropped off by caregiver today during lunch time, while the office was closed. Urine sample was brought into the office in a non sterile clear plastic drinking cup covered with saran wrap. Please review results of POCT UA. Urine sample has been sent out for C&S as ordered below.

## 2020-10-09 NOTE — Addendum Note (Signed)
Addended by: Randal Buba on: 10/09/2020 01:13 PM   Modules accepted: Orders

## 2020-10-09 NOTE — Telephone Encounter (Signed)
Patient was advised.  

## 2020-10-10 ENCOUNTER — Telehealth: Payer: Self-pay

## 2020-10-10 DIAGNOSIS — R300 Dysuria: Secondary | ICD-10-CM

## 2020-10-10 MED ORDER — NITROFURANTOIN MONOHYD MACRO 100 MG PO CAPS: 100.0000 mg | ORAL_CAPSULE | Freq: Two times a day (BID) | ORAL | 0 refills | Status: AC

## 2020-10-10 NOTE — Telephone Encounter (Signed)
Advised patient that medication was sent into the pharmacy.

## 2020-10-10 NOTE — Telephone Encounter (Signed)
-----   Message from Jerrol Banana., MD sent at 10/10/2020 10:45 AM EDT ----- Nitrofurantoin 100 mg twice a day for 3 days.  1 refill.

## 2020-10-12 LAB — URINE CULTURE

## 2020-10-16 ENCOUNTER — Telehealth: Payer: Self-pay

## 2020-10-16 DIAGNOSIS — I110 Hypertensive heart disease with heart failure: Secondary | ICD-10-CM | POA: Diagnosis not present

## 2020-10-16 DIAGNOSIS — R011 Cardiac murmur, unspecified: Secondary | ICD-10-CM | POA: Diagnosis not present

## 2020-10-16 DIAGNOSIS — E871 Hypo-osmolality and hyponatremia: Secondary | ICD-10-CM | POA: Diagnosis not present

## 2020-10-16 DIAGNOSIS — I5022 Chronic systolic (congestive) heart failure: Secondary | ICD-10-CM | POA: Diagnosis not present

## 2020-10-16 DIAGNOSIS — I252 Old myocardial infarction: Secondary | ICD-10-CM | POA: Diagnosis not present

## 2020-10-16 DIAGNOSIS — I2510 Atherosclerotic heart disease of native coronary artery without angina pectoris: Secondary | ICD-10-CM | POA: Diagnosis not present

## 2020-10-16 MED ORDER — CIPROFLOXACIN HCL 250 MG PO TABS: 250.0000 mg | ORAL_TABLET | Freq: Two times a day (BID) | ORAL | 0 refills | Status: AC

## 2020-10-16 NOTE — Telephone Encounter (Signed)
-----   Message from Jerrol Banana., MD sent at 10/16/2020  1:01 PM EDT ----- Probable UTI.  If still symptomatic we will treat with Cipro 250 twice a day for 5 days

## 2020-10-16 NOTE — Telephone Encounter (Signed)
Patient has been advised. KW 

## 2020-10-17 DIAGNOSIS — I252 Old myocardial infarction: Secondary | ICD-10-CM | POA: Diagnosis not present

## 2020-10-17 DIAGNOSIS — R011 Cardiac murmur, unspecified: Secondary | ICD-10-CM | POA: Diagnosis not present

## 2020-10-17 DIAGNOSIS — E871 Hypo-osmolality and hyponatremia: Secondary | ICD-10-CM | POA: Diagnosis not present

## 2020-10-17 DIAGNOSIS — I2510 Atherosclerotic heart disease of native coronary artery without angina pectoris: Secondary | ICD-10-CM | POA: Diagnosis not present

## 2020-10-17 DIAGNOSIS — I5022 Chronic systolic (congestive) heart failure: Secondary | ICD-10-CM | POA: Diagnosis not present

## 2020-10-17 DIAGNOSIS — H353231 Exudative age-related macular degeneration, bilateral, with active choroidal neovascularization: Secondary | ICD-10-CM | POA: Diagnosis not present

## 2020-10-17 DIAGNOSIS — I110 Hypertensive heart disease with heart failure: Secondary | ICD-10-CM | POA: Diagnosis not present

## 2020-10-18 DIAGNOSIS — I110 Hypertensive heart disease with heart failure: Secondary | ICD-10-CM | POA: Diagnosis not present

## 2020-10-18 DIAGNOSIS — I2510 Atherosclerotic heart disease of native coronary artery without angina pectoris: Secondary | ICD-10-CM | POA: Diagnosis not present

## 2020-10-18 DIAGNOSIS — I5022 Chronic systolic (congestive) heart failure: Secondary | ICD-10-CM | POA: Diagnosis not present

## 2020-10-18 DIAGNOSIS — E871 Hypo-osmolality and hyponatremia: Secondary | ICD-10-CM | POA: Diagnosis not present

## 2020-10-18 DIAGNOSIS — R011 Cardiac murmur, unspecified: Secondary | ICD-10-CM | POA: Diagnosis not present

## 2020-10-18 DIAGNOSIS — I252 Old myocardial infarction: Secondary | ICD-10-CM | POA: Diagnosis not present

## 2020-10-22 DIAGNOSIS — I252 Old myocardial infarction: Secondary | ICD-10-CM | POA: Diagnosis not present

## 2020-10-22 DIAGNOSIS — E871 Hypo-osmolality and hyponatremia: Secondary | ICD-10-CM | POA: Diagnosis not present

## 2020-10-22 DIAGNOSIS — I5022 Chronic systolic (congestive) heart failure: Secondary | ICD-10-CM | POA: Diagnosis not present

## 2020-10-22 DIAGNOSIS — R011 Cardiac murmur, unspecified: Secondary | ICD-10-CM | POA: Diagnosis not present

## 2020-10-22 DIAGNOSIS — I110 Hypertensive heart disease with heart failure: Secondary | ICD-10-CM | POA: Diagnosis not present

## 2020-10-22 DIAGNOSIS — I2510 Atherosclerotic heart disease of native coronary artery without angina pectoris: Secondary | ICD-10-CM | POA: Diagnosis not present

## 2020-10-24 DIAGNOSIS — R011 Cardiac murmur, unspecified: Secondary | ICD-10-CM | POA: Diagnosis not present

## 2020-10-24 DIAGNOSIS — I5022 Chronic systolic (congestive) heart failure: Secondary | ICD-10-CM | POA: Diagnosis not present

## 2020-10-24 DIAGNOSIS — I110 Hypertensive heart disease with heart failure: Secondary | ICD-10-CM | POA: Diagnosis not present

## 2020-10-24 DIAGNOSIS — E871 Hypo-osmolality and hyponatremia: Secondary | ICD-10-CM | POA: Diagnosis not present

## 2020-10-24 DIAGNOSIS — I252 Old myocardial infarction: Secondary | ICD-10-CM | POA: Diagnosis not present

## 2020-10-24 DIAGNOSIS — I2510 Atherosclerotic heart disease of native coronary artery without angina pectoris: Secondary | ICD-10-CM | POA: Diagnosis not present

## 2020-10-25 DIAGNOSIS — I110 Hypertensive heart disease with heart failure: Secondary | ICD-10-CM | POA: Diagnosis not present

## 2020-10-25 DIAGNOSIS — I2510 Atherosclerotic heart disease of native coronary artery without angina pectoris: Secondary | ICD-10-CM | POA: Diagnosis not present

## 2020-10-25 DIAGNOSIS — E871 Hypo-osmolality and hyponatremia: Secondary | ICD-10-CM | POA: Diagnosis not present

## 2020-10-25 DIAGNOSIS — I252 Old myocardial infarction: Secondary | ICD-10-CM | POA: Diagnosis not present

## 2020-10-25 DIAGNOSIS — R011 Cardiac murmur, unspecified: Secondary | ICD-10-CM | POA: Diagnosis not present

## 2020-10-25 DIAGNOSIS — I5022 Chronic systolic (congestive) heart failure: Secondary | ICD-10-CM | POA: Diagnosis not present

## 2020-10-30 ENCOUNTER — Telehealth: Payer: Self-pay | Admitting: Family Medicine

## 2020-10-30 DIAGNOSIS — F419 Anxiety disorder, unspecified: Secondary | ICD-10-CM

## 2020-10-30 MED ORDER — HYDROXYZINE PAMOATE 25 MG PO CAPS
25.0000 mg | ORAL_CAPSULE | Freq: Three times a day (TID) | ORAL | 4 refills | Status: DC | PRN
Start: 2020-10-30 — End: 2024-04-06

## 2020-10-30 NOTE — Telephone Encounter (Signed)
Pt called stating that her husband passed a few weeks ago and that she is having trouble with her step daughter. She states that this is causing her too have anxiety and is requesting to have PCP send something over for her. Please advise.      [place], [place] [address]  Phone: [phone] Fax: [phone]  Hours: Not open 24 hours

## 2020-10-30 NOTE — Telephone Encounter (Signed)
Medication was sent into the pharmacy. L/M for patient to call back to advise of this.

## 2020-10-30 NOTE — Telephone Encounter (Signed)
Hydoxyzine 25mg  TID prn,#60,4rf

## 2020-11-01 DIAGNOSIS — R011 Cardiac murmur, unspecified: Secondary | ICD-10-CM | POA: Diagnosis not present

## 2020-11-01 DIAGNOSIS — I2510 Atherosclerotic heart disease of native coronary artery without angina pectoris: Secondary | ICD-10-CM | POA: Diagnosis not present

## 2020-11-01 DIAGNOSIS — I252 Old myocardial infarction: Secondary | ICD-10-CM | POA: Diagnosis not present

## 2020-11-01 DIAGNOSIS — I5022 Chronic systolic (congestive) heart failure: Secondary | ICD-10-CM | POA: Diagnosis not present

## 2020-11-01 DIAGNOSIS — E871 Hypo-osmolality and hyponatremia: Secondary | ICD-10-CM | POA: Diagnosis not present

## 2020-11-01 DIAGNOSIS — I110 Hypertensive heart disease with heart failure: Secondary | ICD-10-CM | POA: Diagnosis not present

## 2020-11-02 DIAGNOSIS — I110 Hypertensive heart disease with heart failure: Secondary | ICD-10-CM | POA: Diagnosis not present

## 2020-11-02 DIAGNOSIS — I2510 Atherosclerotic heart disease of native coronary artery without angina pectoris: Secondary | ICD-10-CM | POA: Diagnosis not present

## 2020-11-02 DIAGNOSIS — R011 Cardiac murmur, unspecified: Secondary | ICD-10-CM | POA: Diagnosis not present

## 2020-11-02 DIAGNOSIS — E871 Hypo-osmolality and hyponatremia: Secondary | ICD-10-CM | POA: Diagnosis not present

## 2020-11-02 DIAGNOSIS — I5022 Chronic systolic (congestive) heart failure: Secondary | ICD-10-CM | POA: Diagnosis not present

## 2020-11-02 DIAGNOSIS — I252 Old myocardial infarction: Secondary | ICD-10-CM | POA: Diagnosis not present

## 2020-11-04 ENCOUNTER — Telehealth: Payer: Self-pay

## 2020-11-04 DIAGNOSIS — L538 Other specified erythematous conditions: Secondary | ICD-10-CM | POA: Diagnosis not present

## 2020-11-04 DIAGNOSIS — D2271 Melanocytic nevi of right lower limb, including hip: Secondary | ICD-10-CM | POA: Diagnosis not present

## 2020-11-04 DIAGNOSIS — L570 Actinic keratosis: Secondary | ICD-10-CM | POA: Diagnosis not present

## 2020-11-04 DIAGNOSIS — R011 Cardiac murmur, unspecified: Secondary | ICD-10-CM | POA: Diagnosis not present

## 2020-11-04 DIAGNOSIS — I110 Hypertensive heart disease with heart failure: Secondary | ICD-10-CM | POA: Diagnosis not present

## 2020-11-04 DIAGNOSIS — E871 Hypo-osmolality and hyponatremia: Secondary | ICD-10-CM | POA: Diagnosis not present

## 2020-11-04 DIAGNOSIS — I5022 Chronic systolic (congestive) heart failure: Secondary | ICD-10-CM | POA: Diagnosis not present

## 2020-11-04 DIAGNOSIS — D2272 Melanocytic nevi of left lower limb, including hip: Secondary | ICD-10-CM | POA: Diagnosis not present

## 2020-11-04 DIAGNOSIS — X32XXXA Exposure to sunlight, initial encounter: Secondary | ICD-10-CM | POA: Diagnosis not present

## 2020-11-04 DIAGNOSIS — D2261 Melanocytic nevi of right upper limb, including shoulder: Secondary | ICD-10-CM | POA: Diagnosis not present

## 2020-11-04 DIAGNOSIS — D2262 Melanocytic nevi of left upper limb, including shoulder: Secondary | ICD-10-CM | POA: Diagnosis not present

## 2020-11-04 DIAGNOSIS — I2510 Atherosclerotic heart disease of native coronary artery without angina pectoris: Secondary | ICD-10-CM | POA: Diagnosis not present

## 2020-11-04 DIAGNOSIS — Z85828 Personal history of other malignant neoplasm of skin: Secondary | ICD-10-CM | POA: Diagnosis not present

## 2020-11-04 DIAGNOSIS — L820 Inflamed seborrheic keratosis: Secondary | ICD-10-CM | POA: Diagnosis not present

## 2020-11-04 DIAGNOSIS — I252 Old myocardial infarction: Secondary | ICD-10-CM | POA: Diagnosis not present

## 2020-11-04 NOTE — Telephone Encounter (Signed)
Copied from [place] [phone]. Topic: Quick Communication - Home Health Verbal Orders >> Nov 04, 2020  3:50 PM [name], Apolonio Schneiders wrote: Callback Number: [place] [phone], VM is secure Requesting: PT Frequency: 1x8

## 2020-11-05 NOTE — Telephone Encounter (Signed)
Verbal okay given.  

## 2020-11-06 DIAGNOSIS — H269 Unspecified cataract: Secondary | ICD-10-CM | POA: Diagnosis not present

## 2020-11-06 DIAGNOSIS — H3530 Unspecified macular degeneration: Secondary | ICD-10-CM | POA: Diagnosis not present

## 2020-11-06 DIAGNOSIS — Z7902 Long term (current) use of antithrombotics/antiplatelets: Secondary | ICD-10-CM | POA: Diagnosis not present

## 2020-11-06 DIAGNOSIS — Z85828 Personal history of other malignant neoplasm of skin: Secondary | ICD-10-CM | POA: Diagnosis not present

## 2020-11-06 DIAGNOSIS — R011 Cardiac murmur, unspecified: Secondary | ICD-10-CM | POA: Diagnosis not present

## 2020-11-06 DIAGNOSIS — R1310 Dysphagia, unspecified: Secondary | ICD-10-CM | POA: Diagnosis not present

## 2020-11-06 DIAGNOSIS — Z950 Presence of cardiac pacemaker: Secondary | ICD-10-CM | POA: Diagnosis not present

## 2020-11-06 DIAGNOSIS — N3289 Other specified disorders of bladder: Secondary | ICD-10-CM | POA: Diagnosis not present

## 2020-11-06 DIAGNOSIS — R32 Unspecified urinary incontinence: Secondary | ICD-10-CM | POA: Diagnosis not present

## 2020-11-06 DIAGNOSIS — I5022 Chronic systolic (congestive) heart failure: Secondary | ICD-10-CM | POA: Diagnosis not present

## 2020-11-06 DIAGNOSIS — N1330 Unspecified hydronephrosis: Secondary | ICD-10-CM | POA: Diagnosis not present

## 2020-11-06 DIAGNOSIS — Z9049 Acquired absence of other specified parts of digestive tract: Secondary | ICD-10-CM | POA: Diagnosis not present

## 2020-11-06 DIAGNOSIS — E876 Hypokalemia: Secondary | ICD-10-CM | POA: Diagnosis not present

## 2020-11-06 DIAGNOSIS — K7460 Unspecified cirrhosis of liver: Secondary | ICD-10-CM | POA: Diagnosis not present

## 2020-11-06 DIAGNOSIS — K219 Gastro-esophageal reflux disease without esophagitis: Secondary | ICD-10-CM | POA: Diagnosis not present

## 2020-11-06 DIAGNOSIS — Z79891 Long term (current) use of opiate analgesic: Secondary | ICD-10-CM | POA: Diagnosis not present

## 2020-11-06 DIAGNOSIS — Z90710 Acquired absence of both cervix and uterus: Secondary | ICD-10-CM | POA: Diagnosis not present

## 2020-11-06 DIAGNOSIS — M1990 Unspecified osteoarthritis, unspecified site: Secondary | ICD-10-CM | POA: Diagnosis not present

## 2020-11-06 DIAGNOSIS — I252 Old myocardial infarction: Secondary | ICD-10-CM | POA: Diagnosis not present

## 2020-11-06 DIAGNOSIS — E871 Hypo-osmolality and hyponatremia: Secondary | ICD-10-CM | POA: Diagnosis not present

## 2020-11-06 DIAGNOSIS — Z9089 Acquired absence of other organs: Secondary | ICD-10-CM | POA: Diagnosis not present

## 2020-11-06 DIAGNOSIS — E878 Other disorders of electrolyte and fluid balance, not elsewhere classified: Secondary | ICD-10-CM | POA: Diagnosis not present

## 2020-11-06 DIAGNOSIS — I2510 Atherosclerotic heart disease of native coronary artery without angina pectoris: Secondary | ICD-10-CM | POA: Diagnosis not present

## 2020-11-06 DIAGNOSIS — Z7982 Long term (current) use of aspirin: Secondary | ICD-10-CM | POA: Diagnosis not present

## 2020-11-06 DIAGNOSIS — I110 Hypertensive heart disease with heart failure: Secondary | ICD-10-CM | POA: Diagnosis not present

## 2020-11-13 ENCOUNTER — Telehealth: Payer: Self-pay

## 2020-11-13 DIAGNOSIS — I5022 Chronic systolic (congestive) heart failure: Secondary | ICD-10-CM | POA: Diagnosis not present

## 2020-11-13 DIAGNOSIS — E871 Hypo-osmolality and hyponatremia: Secondary | ICD-10-CM | POA: Diagnosis not present

## 2020-11-13 DIAGNOSIS — I252 Old myocardial infarction: Secondary | ICD-10-CM | POA: Diagnosis not present

## 2020-11-13 DIAGNOSIS — I110 Hypertensive heart disease with heart failure: Secondary | ICD-10-CM | POA: Diagnosis not present

## 2020-11-13 DIAGNOSIS — R011 Cardiac murmur, unspecified: Secondary | ICD-10-CM | POA: Diagnosis not present

## 2020-11-13 DIAGNOSIS — I2510 Atherosclerotic heart disease of native coronary artery without angina pectoris: Secondary | ICD-10-CM | POA: Diagnosis not present

## 2020-11-13 NOTE — Telephone Encounter (Signed)
Please review for Dr. Gilbert  Thanks,   -Lusine Corlett  

## 2020-11-13 NOTE — Telephone Encounter (Signed)
Copied from [place] [phone]. Topic: General - Other >> Nov 13, 2020  2:11 PM Leward Quan A wrote: Reason for CRM: Audry with [facility] called in to inform Dr Rosanna Randy that patient can not take the hydrOXYzine (VISTARIL) 25 MG capsule any more because it is causing her to vomit. Asking if she can get something else like maybe  Benadryl because she just need an aid for sleep at night. Ph# [phone]

## 2020-11-14 NOTE — Telephone Encounter (Signed)
Patient advised. Patient does have Tramadol 50mg  she will take at bedtime.

## 2020-11-14 NOTE — Telephone Encounter (Signed)
If she is using hydroxyzine for sleep, then we could use tramadol 50mg  tabs - take 25-50mg  qhs prn #30 r1 instead. Ok to send Rx. Avoid benadryl in the elderly.

## 2020-11-20 DIAGNOSIS — R011 Cardiac murmur, unspecified: Secondary | ICD-10-CM | POA: Diagnosis not present

## 2020-11-20 DIAGNOSIS — E871 Hypo-osmolality and hyponatremia: Secondary | ICD-10-CM | POA: Diagnosis not present

## 2020-11-20 DIAGNOSIS — I5022 Chronic systolic (congestive) heart failure: Secondary | ICD-10-CM | POA: Diagnosis not present

## 2020-11-20 DIAGNOSIS — I2510 Atherosclerotic heart disease of native coronary artery without angina pectoris: Secondary | ICD-10-CM | POA: Diagnosis not present

## 2020-11-20 DIAGNOSIS — I252 Old myocardial infarction: Secondary | ICD-10-CM | POA: Diagnosis not present

## 2020-11-20 DIAGNOSIS — I110 Hypertensive heart disease with heart failure: Secondary | ICD-10-CM | POA: Diagnosis not present

## 2020-11-21 DIAGNOSIS — H353231 Exudative age-related macular degeneration, bilateral, with active choroidal neovascularization: Secondary | ICD-10-CM | POA: Diagnosis not present

## 2020-11-25 DIAGNOSIS — E871 Hypo-osmolality and hyponatremia: Secondary | ICD-10-CM | POA: Diagnosis not present

## 2020-11-25 DIAGNOSIS — I252 Old myocardial infarction: Secondary | ICD-10-CM | POA: Diagnosis not present

## 2020-11-25 DIAGNOSIS — I110 Hypertensive heart disease with heart failure: Secondary | ICD-10-CM | POA: Diagnosis not present

## 2020-11-25 DIAGNOSIS — I2510 Atherosclerotic heart disease of native coronary artery without angina pectoris: Secondary | ICD-10-CM | POA: Diagnosis not present

## 2020-11-25 DIAGNOSIS — I5022 Chronic systolic (congestive) heart failure: Secondary | ICD-10-CM | POA: Diagnosis not present

## 2020-11-25 DIAGNOSIS — R011 Cardiac murmur, unspecified: Secondary | ICD-10-CM | POA: Diagnosis not present

## 2020-11-27 ENCOUNTER — Ambulatory Visit (INDEPENDENT_AMBULATORY_CARE_PROVIDER_SITE_OTHER): Payer: Medicare Other | Admitting: Family Medicine

## 2020-11-27 ENCOUNTER — Encounter: Payer: Self-pay | Admitting: Family Medicine

## 2020-11-27 ENCOUNTER — Other Ambulatory Visit: Payer: Self-pay

## 2020-11-27 VITALS — BP 160/89 | HR 80 | Temp 98.2°F | Resp 18 | Ht 62.0 in | Wt 143.0 lb

## 2020-11-27 DIAGNOSIS — E871 Hypo-osmolality and hyponatremia: Secondary | ICD-10-CM | POA: Diagnosis not present

## 2020-11-27 DIAGNOSIS — I10 Essential (primary) hypertension: Secondary | ICD-10-CM | POA: Diagnosis not present

## 2020-11-27 DIAGNOSIS — E876 Hypokalemia: Secondary | ICD-10-CM | POA: Diagnosis not present

## 2020-11-27 DIAGNOSIS — I5043 Acute on chronic combined systolic (congestive) and diastolic (congestive) heart failure: Secondary | ICD-10-CM

## 2020-11-27 DIAGNOSIS — I509 Heart failure, unspecified: Secondary | ICD-10-CM | POA: Diagnosis not present

## 2020-11-27 DIAGNOSIS — R531 Weakness: Secondary | ICD-10-CM | POA: Diagnosis not present

## 2020-11-27 DIAGNOSIS — I110 Hypertensive heart disease with heart failure: Secondary | ICD-10-CM | POA: Diagnosis not present

## 2020-11-27 DIAGNOSIS — I5022 Chronic systolic (congestive) heart failure: Secondary | ICD-10-CM | POA: Diagnosis not present

## 2020-11-27 DIAGNOSIS — I2510 Atherosclerotic heart disease of native coronary artery without angina pectoris: Secondary | ICD-10-CM | POA: Diagnosis not present

## 2020-11-27 DIAGNOSIS — I252 Old myocardial infarction: Secondary | ICD-10-CM | POA: Diagnosis not present

## 2020-11-27 DIAGNOSIS — R011 Cardiac murmur, unspecified: Secondary | ICD-10-CM | POA: Diagnosis not present

## 2020-11-27 NOTE — Patient Instructions (Signed)
Will GET FASTING LABS AT NEXT VISIT.

## 2020-11-27 NOTE — Progress Notes (Signed)
I,[name],acting as a scribe for [name], MD.,have documented all relevant documentation on the behalf of [name], MD,as directed by  [name], MD while in the presence of [name], MD.   Established patient visit   Patient: Teresa Moyer   DOB: 1936-05-15   85 y.o. Female  MRN: [id number] Visit Date: 11/27/2020  Today's healthcare provider: [name], MD   No chief complaint on file.  Subjective    HPI  Overall patient has been feeling stable recently.  She does have chronic fatigue.  Her breathing is okay without chest pain PND orthopnea.  Her weight is down 2 pounds from last visit. Blood pressures at home are better than here. Hypertension, follow-up  BP Readings from Last 3 Encounters:  11/27/20 (!) 160/89  09/10/20 (!) 170/81  09/05/20 (!) 146/65   Wt Readings from Last 3 Encounters:  11/27/20 143 lb (64.9 kg)  09/10/20 149 lb (67.6 kg)  09/05/20 142 lb 3.2 oz (64.5 kg)     She was last seen for hypertension 2 months ago.  BP at that visit was 170/81. Management since that visit includes; Elevated today, will follow over time. She reports good compliance with treatment. She is not having side effects. none She is not exercising. She is adherent to low salt diet.   Outside blood pressures are 130/80.  She does not smoke.  Use of agents associated with hypertension: none.   ----------------------------------------------------------------------  Lipid/Cholesterol, follow-up  Last Lipid Panel: Lab Results  Component Value Date   CHOL 112 01/11/2019   LDLCALC 28 01/11/2019   HDL 61 01/11/2019   TRIG 113 01/11/2019    She was last seen for this 01/11/2019.  Management since that visit includes; on atorvastatin.  She reports good compliance with treatment. She is not having side effects. none  She is following a Regular, Low Sodium diet. Current exercise: none  Last metabolic panel Lab  Results  Component Value Date   GLUCOSE 105 (H) 09/16/2020   NA 133 (L) 09/16/2020   K 4.3 09/16/2020   BUN 13 09/16/2020   CREATININE 0.74 09/16/2020   GFRNONAA >60 09/05/2020   GFRAA 73 07/08/2020   CALCIUM 8.6 (L) 09/16/2020   AST 20 09/03/2020   ALT 17 09/03/2020   The ASCVD Risk score Mikey Bussing DC Jr., et al., 2013) failed to calculate for the following reasons:   The 2013 ASCVD risk score is only valid for ages 38 to 48   The patient has a prior MI or stroke diagnosis  ----------------------------------------------------------------------  Follow up for Congestive heart failure, unspecified HF chronicity  The patient was last seen for this 2 months ago. Changes made at last visit include; On Entresto. Patient with the recent weight gain over the past couple of days. Will take 2 furosemide a day for the next 3 days.   She reports good compliance with treatment. She feels that condition is Improved. She is not having side effects. none  ----------------------------------------------------------------------  Follow up for Hypokalemia  The patient was last seen for this 2 months ago. Changes made at last visit include; Check labs.  With metolazone.  She reports good compliance with treatment. She feels that condition is Unchanged. She is not having side effects. none  ----------------------------------------------------------------------         Medications: Outpatient Medications Prior to Visit  Medication Sig   acetaminophen (TYLENOL) 325 MG tablet Take 650 mg by mouth every 6 (  six) hours as needed.   atorvastatin (LIPITOR) 40 MG tablet Take 1 tablet (40 mg total) by mouth daily at 6 PM.   azelastine (ASTELIN) 0.1 % nasal spray Place 1 spray into both nostrils 2 (two) times daily.   Carboxymethylcell-Hypromellose (GENTEAL) 0.25-0.3 % GEL Apply 1 application to eye at bedtime.    carvedilol (COREG) 12.5 MG tablet Take 12.5 mg by mouth 2 (two) times daily with a  meal.    cetirizine (ZYRTEC) 10 MG tablet Take 10 mg by mouth at bedtime.    fluticasone (FLONASE) 50 MCG/ACT nasal spray Place 1 spray into both nostrils daily.    furosemide (LASIX) 40 MG tablet TAKE 1 TABLET BY MOUTH TWICE DAILY   hydrOXYzine (VISTARIL) 25 MG capsule Take 1 capsule (25 mg total) by mouth 3 (three) times daily as needed.   metolazone (ZAROXOLYN) 2.5 MG tablet Take 1 tablet (2.5 mg total) by mouth daily. 1 pill each morning for 5 days then hold until further evaluation for CHF   Multiple Vitamins-Minerals (PRESERVISION AREDS PO) Take 1 tablet by mouth 2 (two) times daily.    omeprazole (PRILOSEC) 20 MG capsule TAKE 1 CAPSULE BY MOUTH ONCE DAILY   ondansetron (ZOFRAN-ODT) 4 MG disintegrating tablet Take 1 tablet (4 mg total) by mouth every 8 (eight) hours as needed for nausea or vomiting.   potassium chloride (KLOR-CON) 10 MEQ tablet TAKE 1 TABLET BY MOUTH TWICE DAILY (Patient taking differently: Take 20 mEq by mouth 2 (two) times daily.)   Probiotic Product (ALIGN) 4 MG CAPS Take 8 mg by mouth daily after lunch.   sacubitril-valsartan (ENTRESTO) 49-51 MG Take by mouth 2 (two) times daily.    traMADol (ULTRAM) 50 MG tablet Take 0.5 tablets (25 mg total) by mouth every 6 (six) hours as needed for moderate pain. Take 1/2 tablet by mouth every 4 hours PRN, moderate pain   No facility-administered medications prior to visit.    Review of Systems  Constitutional:  Negative for appetite change, chills, fatigue and fever.  Respiratory:  Negative for chest tightness and shortness of breath.   Cardiovascular:  Negative for chest pain and palpitations.  Gastrointestinal:  Negative for abdominal pain, nausea and vomiting.  Neurological:  Negative for dizziness and weakness.       Objective    BP (!) 160/89 (BP Location: Right Arm, Patient Position: Sitting, Cuff Size: Normal)   Pulse 80   Temp 98.2 F (36.8 C) (Oral)   Resp 18   Ht 5\' 2"  (1.575 m)   Wt 143 lb (64.9 kg)   SpO2  95%   BMI 26.16 kg/m  BP Readings from Last 3 Encounters:  11/27/20 (!) 160/89  09/10/20 (!) 170/81  09/05/20 (!) 146/65   Wt Readings from Last 3 Encounters:  11/27/20 143 lb (64.9 kg)  09/10/20 149 lb (67.6 kg)  09/05/20 142 lb 3.2 oz (64.5 kg)       Physical Exam Vitals reviewed.  Constitutional:      Appearance: Normal appearance.  HENT:     Head: Normocephalic and atraumatic.     Right Ear: External ear normal.     Left Ear: External ear normal.  Eyes:     General: No scleral icterus.    Conjunctiva/sclera: Conjunctivae normal.  Cardiovascular:     Rate and Rhythm: Normal rate and regular rhythm.     Pulses: Normal pulses.     Heart sounds: Normal heart sounds.  Pulmonary:     Breath sounds: Normal breath  sounds.  Abdominal:     Palpations: Abdomen is soft.  Musculoskeletal:     Comments: 1+ edema.   Skin:    General: Skin is warm and dry.  Neurological:     Mental Status: She is alert and oriented to person, place, and time. Mental status is at baseline.  Psychiatric:        Mood and Affect: Mood normal.        Behavior: Behavior normal.        Thought Content: Thought content normal.        Judgment: Judgment normal.      No results found for any visits on 11/27/20.  Assessment & Plan     1. Acute on chronic combined systolic and diastolic heart failure (HCC) Clinically stable. On Entresto 2. Essential (primary) hypertension Fair control  3. Congestive heart failure, unspecified HF chronicity, unspecified heart failure type ([place])   4. Hypokalemia Labs on next visit  5. Weakness   6. Coronary artery disease involving native coronary artery of native heart without angina pectoris All risk factors treated.     Return in about 3 months (around 02/27/2021).      I, [name], MD, have reviewed all documentation for this visit. The documentation on 12/02/20 for the exam, diagnosis, procedures, and orders are all accurate and  complete.    Jousha Schwandt Cranford Mon, MD  [facility] [phone] (phone) [phone] (fax)  [place]

## 2020-12-03 DIAGNOSIS — E871 Hypo-osmolality and hyponatremia: Secondary | ICD-10-CM | POA: Diagnosis not present

## 2020-12-03 DIAGNOSIS — I5022 Chronic systolic (congestive) heart failure: Secondary | ICD-10-CM | POA: Diagnosis not present

## 2020-12-03 DIAGNOSIS — I252 Old myocardial infarction: Secondary | ICD-10-CM | POA: Diagnosis not present

## 2020-12-03 DIAGNOSIS — I110 Hypertensive heart disease with heart failure: Secondary | ICD-10-CM | POA: Diagnosis not present

## 2020-12-03 DIAGNOSIS — R011 Cardiac murmur, unspecified: Secondary | ICD-10-CM | POA: Diagnosis not present

## 2020-12-03 DIAGNOSIS — I2510 Atherosclerotic heart disease of native coronary artery without angina pectoris: Secondary | ICD-10-CM | POA: Diagnosis not present

## 2020-12-04 DIAGNOSIS — I110 Hypertensive heart disease with heart failure: Secondary | ICD-10-CM | POA: Diagnosis not present

## 2020-12-04 DIAGNOSIS — R011 Cardiac murmur, unspecified: Secondary | ICD-10-CM | POA: Diagnosis not present

## 2020-12-04 DIAGNOSIS — I2510 Atherosclerotic heart disease of native coronary artery without angina pectoris: Secondary | ICD-10-CM | POA: Diagnosis not present

## 2020-12-04 DIAGNOSIS — I5022 Chronic systolic (congestive) heart failure: Secondary | ICD-10-CM | POA: Diagnosis not present

## 2020-12-04 DIAGNOSIS — E871 Hypo-osmolality and hyponatremia: Secondary | ICD-10-CM | POA: Diagnosis not present

## 2020-12-04 DIAGNOSIS — I252 Old myocardial infarction: Secondary | ICD-10-CM | POA: Diagnosis not present

## 2020-12-06 DIAGNOSIS — I252 Old myocardial infarction: Secondary | ICD-10-CM | POA: Diagnosis not present

## 2020-12-06 DIAGNOSIS — N1330 Unspecified hydronephrosis: Secondary | ICD-10-CM | POA: Diagnosis not present

## 2020-12-06 DIAGNOSIS — R1310 Dysphagia, unspecified: Secondary | ICD-10-CM | POA: Diagnosis not present

## 2020-12-06 DIAGNOSIS — Z9089 Acquired absence of other organs: Secondary | ICD-10-CM | POA: Diagnosis not present

## 2020-12-06 DIAGNOSIS — R011 Cardiac murmur, unspecified: Secondary | ICD-10-CM | POA: Diagnosis not present

## 2020-12-06 DIAGNOSIS — E871 Hypo-osmolality and hyponatremia: Secondary | ICD-10-CM | POA: Diagnosis not present

## 2020-12-06 DIAGNOSIS — I110 Hypertensive heart disease with heart failure: Secondary | ICD-10-CM | POA: Diagnosis not present

## 2020-12-06 DIAGNOSIS — K219 Gastro-esophageal reflux disease without esophagitis: Secondary | ICD-10-CM | POA: Diagnosis not present

## 2020-12-06 DIAGNOSIS — E876 Hypokalemia: Secondary | ICD-10-CM | POA: Diagnosis not present

## 2020-12-06 DIAGNOSIS — Z7982 Long term (current) use of aspirin: Secondary | ICD-10-CM | POA: Diagnosis not present

## 2020-12-06 DIAGNOSIS — Z950 Presence of cardiac pacemaker: Secondary | ICD-10-CM | POA: Diagnosis not present

## 2020-12-06 DIAGNOSIS — R32 Unspecified urinary incontinence: Secondary | ICD-10-CM | POA: Diagnosis not present

## 2020-12-06 DIAGNOSIS — I2510 Atherosclerotic heart disease of native coronary artery without angina pectoris: Secondary | ICD-10-CM | POA: Diagnosis not present

## 2020-12-06 DIAGNOSIS — Z90710 Acquired absence of both cervix and uterus: Secondary | ICD-10-CM | POA: Diagnosis not present

## 2020-12-06 DIAGNOSIS — Z7902 Long term (current) use of antithrombotics/antiplatelets: Secondary | ICD-10-CM | POA: Diagnosis not present

## 2020-12-06 DIAGNOSIS — Z79891 Long term (current) use of opiate analgesic: Secondary | ICD-10-CM | POA: Diagnosis not present

## 2020-12-06 DIAGNOSIS — I5022 Chronic systolic (congestive) heart failure: Secondary | ICD-10-CM | POA: Diagnosis not present

## 2020-12-06 DIAGNOSIS — Z9049 Acquired absence of other specified parts of digestive tract: Secondary | ICD-10-CM | POA: Diagnosis not present

## 2020-12-06 DIAGNOSIS — H3530 Unspecified macular degeneration: Secondary | ICD-10-CM | POA: Diagnosis not present

## 2020-12-06 DIAGNOSIS — Z85828 Personal history of other malignant neoplasm of skin: Secondary | ICD-10-CM | POA: Diagnosis not present

## 2020-12-06 DIAGNOSIS — E878 Other disorders of electrolyte and fluid balance, not elsewhere classified: Secondary | ICD-10-CM | POA: Diagnosis not present

## 2020-12-06 DIAGNOSIS — N3289 Other specified disorders of bladder: Secondary | ICD-10-CM | POA: Diagnosis not present

## 2020-12-06 DIAGNOSIS — H269 Unspecified cataract: Secondary | ICD-10-CM | POA: Diagnosis not present

## 2020-12-06 DIAGNOSIS — M1990 Unspecified osteoarthritis, unspecified site: Secondary | ICD-10-CM | POA: Diagnosis not present

## 2020-12-06 DIAGNOSIS — K7460 Unspecified cirrhosis of liver: Secondary | ICD-10-CM | POA: Diagnosis not present

## 2020-12-10 DIAGNOSIS — I5022 Chronic systolic (congestive) heart failure: Secondary | ICD-10-CM | POA: Diagnosis not present

## 2020-12-10 DIAGNOSIS — I110 Hypertensive heart disease with heart failure: Secondary | ICD-10-CM | POA: Diagnosis not present

## 2020-12-10 DIAGNOSIS — I252 Old myocardial infarction: Secondary | ICD-10-CM | POA: Diagnosis not present

## 2020-12-10 DIAGNOSIS — R011 Cardiac murmur, unspecified: Secondary | ICD-10-CM | POA: Diagnosis not present

## 2020-12-10 DIAGNOSIS — I2510 Atherosclerotic heart disease of native coronary artery without angina pectoris: Secondary | ICD-10-CM | POA: Diagnosis not present

## 2020-12-10 DIAGNOSIS — E871 Hypo-osmolality and hyponatremia: Secondary | ICD-10-CM | POA: Diagnosis not present

## 2020-12-20 DIAGNOSIS — E871 Hypo-osmolality and hyponatremia: Secondary | ICD-10-CM | POA: Diagnosis not present

## 2020-12-20 DIAGNOSIS — I252 Old myocardial infarction: Secondary | ICD-10-CM | POA: Diagnosis not present

## 2020-12-20 DIAGNOSIS — R011 Cardiac murmur, unspecified: Secondary | ICD-10-CM | POA: Diagnosis not present

## 2020-12-20 DIAGNOSIS — I110 Hypertensive heart disease with heart failure: Secondary | ICD-10-CM | POA: Diagnosis not present

## 2020-12-20 DIAGNOSIS — I2510 Atherosclerotic heart disease of native coronary artery without angina pectoris: Secondary | ICD-10-CM | POA: Diagnosis not present

## 2020-12-20 DIAGNOSIS — I5022 Chronic systolic (congestive) heart failure: Secondary | ICD-10-CM | POA: Diagnosis not present

## 2020-12-26 DIAGNOSIS — H353231 Exudative age-related macular degeneration, bilateral, with active choroidal neovascularization: Secondary | ICD-10-CM | POA: Diagnosis not present

## 2021-01-21 ENCOUNTER — Other Ambulatory Visit: Payer: Self-pay | Admitting: Family Medicine

## 2021-01-21 DIAGNOSIS — I5043 Acute on chronic combined systolic (congestive) and diastolic (congestive) heart failure: Secondary | ICD-10-CM

## 2021-01-21 NOTE — Telephone Encounter (Signed)
   Notes to clinic:  Review for refill Medication last filled 11/29/2020 for 30 tabs  Patient should have been out in July    Requested Prescriptions  Pending Prescriptions Disp Refills   furosemide (LASIX) 40 MG tablet [Pharmacy Med Name: FUROSEMIDE 40 MG TAB] 60 tablet 3    Sig: TAKE 1 TABLET BY MOUTH TWICE DAILY      Cardiovascular:  Diuretics - Loop Failed - 01/21/2021  9:43 AM      Failed - Ca in normal range and within 360 days    Calcium  Date Value Ref Range Status  09/16/2020 8.6 (L) 8.7 - 10.3 mg/dL Final   Calcium, Ion  Date Value Ref Range Status  12/20/2019 1.23 1.15 - 1.40 mmol/L Final          Failed - Na in normal range and within 360 days    Sodium  Date Value Ref Range Status  09/16/2020 133 (L) 134 - 144 mmol/L Final          Failed - Last BP in normal range    BP Readings from Last 1 Encounters:  11/27/20 (!) 160/89          Passed - K in normal range and within 360 days    Potassium  Date Value Ref Range Status  09/16/2020 4.3 3.5 - 5.2 mmol/L Final          Passed - Cr in normal range and within 360 days    Creatinine, Ser  Date Value Ref Range Status  09/16/2020 0.74 0.57 - 1.00 mg/dL Final          Passed - Valid encounter within last 6 months    Recent Outpatient Visits           1 month ago Acute on chronic combined systolic and diastolic heart failure [facility])   [facility] Jerrol Banana., MD   4 months ago Congestive heart failure, unspecified HF chronicity, unspecified heart failure type [facility])   [facility] Jerrol Banana., MD   4 months ago Acute on chronic combined systolic and diastolic CHF (congestive heart failure) [facility])   [facility] Jerrol Banana., MD   6 months ago Acute on chronic combined systolic and diastolic CHF (congestive heart failure) [facility])   [facility] Jerrol Banana., MD   8 months ago Acute on chronic combined systolic and  diastolic CHF (congestive heart failure) [facility])   [facility] [place], [place], [place]       Future Appointments             In 1 month Jerrol Banana., MD [facility]

## 2021-01-23 DIAGNOSIS — Z96652 Presence of left artificial knee joint: Secondary | ICD-10-CM | POA: Diagnosis not present

## 2021-01-30 DIAGNOSIS — H353231 Exudative age-related macular degeneration, bilateral, with active choroidal neovascularization: Secondary | ICD-10-CM | POA: Diagnosis not present

## 2021-02-25 ENCOUNTER — Other Ambulatory Visit: Payer: Self-pay | Admitting: Family Medicine

## 2021-03-04 ENCOUNTER — Other Ambulatory Visit: Payer: Self-pay

## 2021-03-04 ENCOUNTER — Encounter: Payer: Self-pay | Admitting: Family Medicine

## 2021-03-04 ENCOUNTER — Ambulatory Visit (INDEPENDENT_AMBULATORY_CARE_PROVIDER_SITE_OTHER): Payer: Medicare Other | Admitting: Family Medicine

## 2021-03-04 VITALS — BP 169/77 | HR 77 | Temp 98.3°F | Resp 16 | Wt 129.0 lb

## 2021-03-04 DIAGNOSIS — I2510 Atherosclerotic heart disease of native coronary artery without angina pectoris: Secondary | ICD-10-CM

## 2021-03-04 DIAGNOSIS — E782 Mixed hyperlipidemia: Secondary | ICD-10-CM | POA: Diagnosis not present

## 2021-03-04 DIAGNOSIS — I10 Essential (primary) hypertension: Secondary | ICD-10-CM | POA: Diagnosis not present

## 2021-03-04 DIAGNOSIS — Z23 Encounter for immunization: Secondary | ICD-10-CM | POA: Diagnosis not present

## 2021-03-04 DIAGNOSIS — I509 Heart failure, unspecified: Secondary | ICD-10-CM | POA: Diagnosis not present

## 2021-03-04 DIAGNOSIS — I25118 Atherosclerotic heart disease of native coronary artery with other forms of angina pectoris: Secondary | ICD-10-CM | POA: Diagnosis not present

## 2021-03-04 DIAGNOSIS — R531 Weakness: Secondary | ICD-10-CM | POA: Diagnosis not present

## 2021-03-04 DIAGNOSIS — E876 Hypokalemia: Secondary | ICD-10-CM

## 2021-03-04 DIAGNOSIS — R634 Abnormal weight loss: Secondary | ICD-10-CM

## 2021-03-04 NOTE — Progress Notes (Signed)
Established patient visit   Patient: Teresa Moyer   DOB: 1935/06/19   85 y.o. Female  MRN: [id number] Visit Date: 03/04/2021  Today's healthcare provider: [name], MD   Chief Complaint  Patient presents with   Hypertension   Hyperlipidemia   hypokalemia   Subjective    HPI  Patient comes in today for follow-up.  Her husband passed away in late 10/14/2022.  She has been struggling with changes in with her chronic health problems.  Acute problems but she has lost weight recently.  Not much of an appetite. Hypertension, follow-up  BP Readings from Last 3 Encounters:  03/04/21 (!) 169/77  11/27/20 (!) 160/89  09/10/20 (!) 170/81   Wt Readings from Last 3 Encounters:  03/04/21 129 lb (58.5 kg)  11/27/20 143 lb (64.9 kg)  09/10/20 149 lb (67.6 kg)     She was last seen for hypertension 3 months ago.  BP at that visit was 160/89. Management since that visit includes; Fair control. She reports good compliance with treatment. She is not having side effects.  She is not exercising. She is adherent to low salt diet.   Outside blood pressures are checked occasionally.  She does not smoke.  Use of agents associated with hypertension: none.   Lipid/Cholesterol, follow-up  Last Lipid Panel: Lab Results  Component Value Date   CHOL 104 03/04/2021   LDLCALC 14 03/04/2021   HDL 73 03/04/2021   TRIG 85 03/04/2021    She was last seen for this 3 months ago.  Management since that visit includes no medication changes.  She reports good compliance with treatment. She is not having side effects.   She is following a Regular diet. Current exercise: no regular exercise  Last metabolic panel Lab Results  Component Value Date   GLUCOSE 106 (H) 03/04/2021   NA 140 03/04/2021   K 3.6 03/04/2021   BUN 7 (L) 03/04/2021   CREATININE 0.84 03/04/2021   GFRNONAA >60 09/05/2020   GFRAA 73 07/08/2020   CALCIUM 9.3 03/04/2021   AST 18 03/04/2021   ALT 8 03/04/2021    The ASCVD Risk score (Arnett DK, et al., 2019) failed to calculate for the following reasons:   The 2019 ASCVD risk score is only valid for ages 4 to 64   The patient has a prior MI or stroke diagnosis  Follow up for Hypokalemia:  The patient was last seen for this 3 months ago. Changes made at last visit include; Labs on next visit.  She reports good compliance with treatment. She feels that condition is Unchanged. She is not having side effects.     Medications: Outpatient Medications Prior to Visit  Medication Sig   acetaminophen (TYLENOL) 325 MG tablet Take 650 mg by mouth every 6 (six) hours as needed.   atorvastatin (LIPITOR) 40 MG tablet Take 1 tablet (40 mg total) by mouth daily at 6 PM.   azelastine (ASTELIN) 0.1 % nasal spray Place 1 spray into both nostrils 2 (two) times daily.   Carboxymethylcell-Hypromellose (GENTEAL) 0.25-0.3 % GEL Apply 1 application to eye at bedtime.    carvedilol (COREG) 12.5 MG tablet Take 12.5 mg by mouth 2 (two) times daily with a meal.    cetirizine (ZYRTEC) 10 MG tablet Take 10 mg by mouth at bedtime.    fluticasone (FLONASE) 50 MCG/ACT nasal spray Place 1 spray into both nostrils daily.    furosemide (LASIX) 40 MG tablet TAKE 1 TABLET BY  MOUTH TWICE DAILY   hydrOXYzine (VISTARIL) 25 MG capsule Take 1 capsule (25 mg total) by mouth 3 (three) times daily as needed.   metolazone (ZAROXOLYN) 2.5 MG tablet Take 1 tablet (2.5 mg total) by mouth daily. 1 pill each morning for 5 days then hold until further evaluation for CHF   Multiple Vitamins-Minerals (PRESERVISION AREDS PO) Take 1 tablet by mouth 2 (two) times daily.    omeprazole (PRILOSEC) 20 MG capsule TAKE 1 CAPSULE BY MOUTH ONCE DAILY   ondansetron (ZOFRAN-ODT) 4 MG disintegrating tablet Take 1 tablet (4 mg total) by mouth every 8 (eight) hours as needed for nausea or vomiting.   potassium chloride (KLOR-CON) 10 MEQ tablet TAKE 1 TABLET BY MOUTH TWICE DAILY (Patient taking differently:  Take 20 mEq by mouth 2 (two) times daily.)   Probiotic Product (ALIGN) 4 MG CAPS Take 8 mg by mouth daily after lunch.   sacubitril-valsartan (ENTRESTO) 49-51 MG Take by mouth 2 (two) times daily.    traMADol (ULTRAM) 50 MG tablet TAKE 1/2 TABLET BY MOUTH EVERY 4 TO 6 HOURS AS NEEDED  FOR MODERATE PAIN   No facility-administered medications prior to visit.    Review of Systems  Constitutional:  Negative for appetite change, chills, fatigue and fever.  Respiratory:  Negative for chest tightness and shortness of breath.   Cardiovascular:  Negative for chest pain and palpitations.  Gastrointestinal:  Negative for abdominal pain, nausea and vomiting.  Neurological:  Negative for dizziness and weakness.       Objective    BP (!) 169/77   Pulse 77   Temp 98.3 F (36.8 C)   Resp 16   Wt 129 lb (58.5 kg)   BMI 23.59 kg/m  BP Readings from Last 3 Encounters:  03/04/21 (!) 169/77  11/27/20 (!) 160/89  09/10/20 (!) 170/81   Wt Readings from Last 3 Encounters:  03/04/21 129 lb (58.5 kg)  11/27/20 143 lb (64.9 kg)  09/10/20 149 lb (67.6 kg)      Physical Exam Vitals reviewed.  Constitutional:      Appearance: Normal appearance.  HENT:     Head: Normocephalic and atraumatic.     Right Ear: External ear normal.     Left Ear: External ear normal.  Eyes:     General: No scleral icterus.    Conjunctiva/sclera: Conjunctivae normal.  Cardiovascular:     Rate and Rhythm: Normal rate and regular rhythm.     Pulses: Normal pulses.     Heart sounds: Normal heart sounds.  Pulmonary:     Breath sounds: Normal breath sounds.  Abdominal:     Palpations: Abdomen is soft.  Musculoskeletal:     Comments: 1+ edema.   Skin:    General: Skin is warm and dry.  Neurological:     Mental Status: She is alert and oriented to person, place, and time. Mental status is at baseline.  Psychiatric:        Mood and Affect: Mood normal.        Behavior: Behavior normal.        Thought Content:  Thought content normal.        Judgment: Judgment normal.      Results for orders placed or performed in visit on 03/04/21  Lipid panel  Result Value Ref Range   Cholesterol, Total 104 100 - 199 mg/dL   Triglycerides 85 0 - 149 mg/dL   HDL 73 >39 mg/dL   VLDL Cholesterol Cal 17 5 -  40 mg/dL   LDL Chol Calc (NIH) 14 0 - 99 mg/dL   Chol/HDL Ratio 1.4 0.0 - 4.4 ratio  CBC w/Diff/Platelet  Result Value Ref Range   WBC 11.1 (H) 3.4 - 10.8 x10E3/uL   RBC 4.42 3.77 - 5.28 x10E6/uL   Hemoglobin 12.5 11.1 - 15.9 g/dL   Hematocrit 37.3 34.0 - 46.6 %   MCV 84 79 - 97 fL   MCH 28.3 26.6 - 33.0 pg   MCHC 33.5 31.5 - 35.7 g/dL   RDW 16.8 (H) 11.7 - 15.4 %   Platelets 227 150 - 450 x10E3/uL   Neutrophils 59 Not Estab. %   Lymphs 33 Not Estab. %   Monocytes 6 Not Estab. %   Eos 1 Not Estab. %   Basos 1 Not Estab. %   Neutrophils Absolute 6.6 1.4 - 7.0 x10E3/uL   Lymphocytes Absolute 3.6 (H) 0.7 - 3.1 x10E3/uL   Monocytes Absolute 0.7 0.1 - 0.9 x10E3/uL   EOS (ABSOLUTE) 0.1 0.0 - 0.4 x10E3/uL   Basophils Absolute 0.1 0.0 - 0.2 x10E3/uL   Immature Granulocytes 0 Not Estab. %   Immature Grans (Abs) 0.0 0.0 - 0.1 x10E3/uL  Comprehensive Metabolic Panel (CMET)  Result Value Ref Range   Glucose 106 (H) 65 - 99 mg/dL   BUN 7 (L) 8 - 27 mg/dL   Creatinine, Ser 0.84 0.57 - 1.00 mg/dL   eGFR 69 >59 mL/min/1.73   BUN/Creatinine Ratio 8 (L) 12 - 28   Sodium 140 134 - 144 mmol/L   Potassium 3.6 3.5 - 5.2 mmol/L   Chloride 102 96 - 106 mmol/L   CO2 22 20 - 29 mmol/L   Calcium 9.3 8.7 - 10.3 mg/dL   Total Protein 6.3 6.0 - 8.5 g/dL   Albumin 4.0 3.6 - 4.6 g/dL   Globulin, Total 2.3 1.5 - 4.5 g/dL   Albumin/Globulin Ratio 1.7 1.2 - 2.2   Bilirubin Total 2.1 (H) 0.0 - 1.2 mg/dL   Alkaline Phosphatase 100 44 - 121 IU/L   AST 18 0 - 40 IU/L   ALT 8 0 - 32 IU/L    Assessment & Plan     1. Congestive heart failure, unspecified HF chronicity, unspecified heart failure type ([place]) Tolerating  Entresto.  It is kept her out of the hospital.  2. Weight loss 20 pound weight loss in the last 6 months.  Follow-up this fall.  3. Essential (primary) hypertension Blood pressure next visit. - Lipid panel - CBC w/Diff/Platelet  4. Hypokalemia  - Comprehensive Metabolic Panel (CMET)  5. Need for influenza vaccination  - Flu Vaccine QUAD High Dose(Fluad)  6. Coronary artery disease of native artery of native heart with stable angina pectoris ([place]) All risk factors treated.  7. Weakness Multifactorial.  8. Mixed hyperlipidemia On atorvastatin   Return in about 1 month (around 04/03/2021).      I, [name], MD, have reviewed all documentation for this visit. The documentation on 03/08/21 for the exam, diagnosis, procedures, and orders are all accurate and complete.    Catherina Pates Cranford Mon, MD  [facility] [phone] (phone) [phone] (fax)  [place]

## 2021-03-05 LAB — COMPREHENSIVE METABOLIC PANEL
ALT: 8 IU/L (ref 0–32)
AST: 18 IU/L (ref 0–40)
Albumin/Globulin Ratio: 1.7 (ref 1.2–2.2)
Albumin: 4 g/dL (ref 3.6–4.6)
Alkaline Phosphatase: 100 IU/L (ref 44–121)
BUN/Creatinine Ratio: 8 — ABNORMAL LOW (ref 12–28)
BUN: 7 mg/dL — ABNORMAL LOW (ref 8–27)
Bilirubin Total: 2.1 mg/dL — ABNORMAL HIGH (ref 0.0–1.2)
CO2: 22 mmol/L (ref 20–29)
Calcium: 9.3 mg/dL (ref 8.7–10.3)
Chloride: 102 mmol/L (ref 96–106)
Creatinine, Ser: 0.84 mg/dL (ref 0.57–1.00)
Globulin, Total: 2.3 g/dL (ref 1.5–4.5)
Glucose: 106 mg/dL — ABNORMAL HIGH (ref 65–99)
Potassium: 3.6 mmol/L (ref 3.5–5.2)
Sodium: 140 mmol/L (ref 134–144)
Total Protein: 6.3 g/dL (ref 6.0–8.5)
eGFR: 69 mL/min/{1.73_m2} (ref >59)

## 2021-03-05 LAB — CBC WITH DIFFERENTIAL/PLATELET
Basophils Absolute: 0.1 10*3/uL (ref 0.0–0.2)
Basos: 1 %
EOS (ABSOLUTE): 0.1 10*3/uL (ref 0.0–0.4)
Eos: 1 %
Hematocrit: 37.3 % (ref 34.0–46.6)
Hemoglobin: 12.5 g/dL (ref 11.1–15.9)
Immature Grans (Abs): 0 10*3/uL (ref 0.0–0.1)
Immature Granulocytes: 0 %
Lymphocytes Absolute: 3.6 10*3/uL — ABNORMAL HIGH (ref 0.7–3.1)
Lymphs: 33 %
MCH: 28.3 pg (ref 26.6–33.0)
MCHC: 33.5 g/dL (ref 31.5–35.7)
MCV: 84 fL (ref 79–97)
Monocytes Absolute: 0.7 10*3/uL (ref 0.1–0.9)
Monocytes: 6 %
Neutrophils Absolute: 6.6 10*3/uL (ref 1.4–7.0)
Neutrophils: 59 %
Platelets: 227 10*3/uL (ref 150–450)
RBC: 4.42 x10E6/uL (ref 3.77–5.28)
RDW: 16.8 % — ABNORMAL HIGH (ref 11.7–15.4)
WBC: 11.1 10*3/uL — ABNORMAL HIGH (ref 3.4–10.8)

## 2021-03-05 LAB — LIPID PANEL
Chol/HDL Ratio: 1.4 ratio (ref 0.0–4.4)
Cholesterol, Total: 104 mg/dL (ref 100–199)
HDL: 73 mg/dL (ref >39)
LDL Chol Calc (NIH): 14 mg/dL (ref 0–99)
Triglycerides: 85 mg/dL (ref 0–149)
VLDL Cholesterol Cal: 17 mg/dL (ref 5–40)

## 2021-03-06 DIAGNOSIS — H353231 Exudative age-related macular degeneration, bilateral, with active choroidal neovascularization: Secondary | ICD-10-CM | POA: Diagnosis not present

## 2021-04-03 ENCOUNTER — Other Ambulatory Visit: Payer: Self-pay

## 2021-04-03 ENCOUNTER — Ambulatory Visit (INDEPENDENT_AMBULATORY_CARE_PROVIDER_SITE_OTHER): Payer: Medicare Other | Admitting: Family Medicine

## 2021-04-03 ENCOUNTER — Encounter: Payer: Self-pay | Admitting: Family Medicine

## 2021-04-03 VITALS — BP 160/90 | HR 72 | Temp 97.8°F | Resp 18 | Ht 62.0 in | Wt 131.0 lb

## 2021-04-03 DIAGNOSIS — E782 Mixed hyperlipidemia: Secondary | ICD-10-CM

## 2021-04-03 DIAGNOSIS — I2510 Atherosclerotic heart disease of native coronary artery without angina pectoris: Secondary | ICD-10-CM | POA: Diagnosis not present

## 2021-04-03 DIAGNOSIS — R531 Weakness: Secondary | ICD-10-CM | POA: Diagnosis not present

## 2021-04-03 DIAGNOSIS — K219 Gastro-esophageal reflux disease without esophagitis: Secondary | ICD-10-CM

## 2021-04-03 DIAGNOSIS — I25118 Atherosclerotic heart disease of native coronary artery with other forms of angina pectoris: Secondary | ICD-10-CM | POA: Diagnosis not present

## 2021-04-03 DIAGNOSIS — I5043 Acute on chronic combined systolic (congestive) and diastolic (congestive) heart failure: Secondary | ICD-10-CM

## 2021-04-03 DIAGNOSIS — M1991 Primary osteoarthritis, unspecified site: Secondary | ICD-10-CM

## 2021-04-03 NOTE — Progress Notes (Signed)
I,[name],acting as a scribe for [name], MD.,have documented all relevant documentation on the behalf of [name], MD,as directed by  [name], MD while in the presence of [name], MD.   Established patient visit   Patient: Teresa Moyer   DOB: [birth year]   85 y.o. Female  MRN: [id number] Visit Date: 04/03/2021  Today's healthcare provider: [name], MD   Chief Complaint  Patient presents with   Follow-up   Weight Loss   Subjective    HPI  Patient is an 85 year old female who presents for follow up of weight loss.  She was last seen for this 1 month ago.  Patient had reported a 20 pound weight loss over 6 months prior to that visit.   Things have stabilized a bit the Past couple of months and her weight is stable from last visit and she is feeling a little stronger.  No chest pain and her breathing is okay. O2 sat when she is brought back at 92% and I rechecked it on room air during the visit and it is 97%. Wt Readings from Last 3 Encounters:  04/03/21 131 lb (59.4 kg)  03/04/21 129 lb (58.5 kg)  11/27/20 143 lb (64.9 kg)      Medications: Outpatient Medications Prior to Visit  Medication Sig   acetaminophen (TYLENOL) 325 MG tablet Take 650 mg by mouth every 6 (six) hours as needed.   atorvastatin (LIPITOR) 40 MG tablet Take 1 tablet (40 mg total) by mouth daily at 6 PM.   azelastine (ASTELIN) 0.1 % nasal spray Place 1 spray into both nostrils 2 (two) times daily.   Carboxymethylcell-Hypromellose (GENTEAL) 0.25-0.3 % GEL Apply 1 application to eye at bedtime.    carvedilol (COREG) 12.5 MG tablet Take 12.5 mg by mouth 2 (two) times daily with a meal.    cetirizine (ZYRTEC) 10 MG tablet Take 10 mg by mouth at bedtime.    fluticasone (FLONASE) 50 MCG/ACT nasal spray Place 1 spray into both nostrils daily.    furosemide (LASIX) 40 MG tablet TAKE 1 TABLET BY MOUTH TWICE DAILY   hydrOXYzine (VISTARIL) 25 MG capsule  Take 1 capsule (25 mg total) by mouth 3 (three) times daily as needed.   metolazone (ZAROXOLYN) 2.5 MG tablet Take 1 tablet (2.5 mg total) by mouth daily. 1 pill each morning for 5 days then hold until further evaluation for CHF   Multiple Vitamins-Minerals (PRESERVISION AREDS PO) Take 1 tablet by mouth 2 (two) times daily.    omeprazole (PRILOSEC) 20 MG capsule TAKE 1 CAPSULE BY MOUTH ONCE DAILY   ondansetron (ZOFRAN-ODT) 4 MG disintegrating tablet Take 1 tablet (4 mg total) by mouth every 8 (eight) hours as needed for nausea or vomiting.   potassium chloride (KLOR-CON) 10 MEQ tablet TAKE 1 TABLET BY MOUTH TWICE DAILY (Patient taking differently: Take 20 mEq by mouth 2 (two) times daily.)   Probiotic Product (ALIGN) 4 MG CAPS Take 8 mg by mouth daily after lunch.   traMADol (ULTRAM) 50 MG tablet TAKE 1/2 TABLET BY MOUTH EVERY 4 TO 6 HOURS AS NEEDED  FOR MODERATE PAIN   No facility-administered medications prior to visit.    Review of Systems  Constitutional:  Negative for appetite change, chills, fatigue and fever.  Respiratory:  Negative for chest tightness and shortness of breath.   Cardiovascular:  Negative for chest pain and palpitations.  Gastrointestinal:  Negative for abdominal pain, nausea  and vomiting.  Neurological:  Negative for dizziness and weakness.       Objective    BP (!) 160/90 (BP Location: Right Arm, Patient Position: Sitting, Cuff Size: Normal)   Pulse 72   Temp 97.8 F (36.6 C) (Temporal)   Resp 18   Ht 5\' 2"  (1.575 m)   Wt 131 lb (59.4 kg)   SpO2 92%   BMI 23.96 kg/m  BP Readings from Last 3 Encounters:  04/03/21 (!) 160/90  03/04/21 (!) 169/77  11/27/20 (!) 160/89   Wt Readings from Last 3 Encounters:  04/03/21 131 lb (59.4 kg)  03/04/21 129 lb (58.5 kg)  11/27/20 143 lb (64.9 kg)      Physical Exam Vitals reviewed.  Constitutional:      Appearance: Normal appearance.  HENT:     Head: Normocephalic and atraumatic.     Right Ear: External ear  normal.     Left Ear: External ear normal.  Eyes:     General: No scleral icterus.    Conjunctiva/sclera: Conjunctivae normal.  Cardiovascular:     Rate and Rhythm: Normal rate and regular rhythm.     Pulses: Normal pulses.     Heart sounds: Normal heart sounds.  Pulmonary:     Breath sounds: Normal breath sounds.  Abdominal:     Palpations: Abdomen is soft.  Musculoskeletal:     Comments: 1+ edema.   Skin:    General: Skin is warm and dry.  Neurological:     Mental Status: She is alert and oriented to person, place, and time. Mental status is at baseline.  Psychiatric:        Mood and Affect: Mood normal.        Behavior: Behavior normal.        Thought Content: Thought content normal.        Judgment: Judgment normal.      No results found for any visits on 04/03/21.  Assessment & Plan     1. Acute on chronic combined systolic and diastolic CHF (congestive heart failure) (H[facility]) Clinically stable on Coreg metolazone on an as-needed basis and Entresto as maintenance  2. Coronary artery disease of native artery of native heart with stable angina pectoris ([place]) On atorvastatin 40  3. Gastro-esophageal reflux disease without esophagitis On omeprazole daily for symptom control  4. Primary osteoarthritis, unspecified site   5. Weakness Chronic with her heart failure. Malnourished at this time also.  6. Mixed hyperlipidemia On atorvastatin, follow-up 4 to 66-month   No follow-ups on file.      I, [name], MD, have reviewed all documentation for this visit. The documentation on 04/09/21 for the exam, diagnosis, procedures, and orders are all accurate and complete.    Jovanny Stephanie Cranford Mon, MD  [facility] [phone] (phone) [phone] (fax)  [place]

## 2021-04-17 DIAGNOSIS — H353231 Exudative age-related macular degeneration, bilateral, with active choroidal neovascularization: Secondary | ICD-10-CM | POA: Diagnosis not present

## 2021-05-02 ENCOUNTER — Other Ambulatory Visit: Payer: Self-pay | Admitting: Family Medicine

## 2021-05-02 DIAGNOSIS — Z1231 Encounter for screening mammogram for malignant neoplasm of breast: Secondary | ICD-10-CM

## 2021-05-12 ENCOUNTER — Emergency Department: Payer: Medicare Other

## 2021-05-12 ENCOUNTER — Other Ambulatory Visit: Payer: Self-pay

## 2021-05-12 DIAGNOSIS — G8929 Other chronic pain: Secondary | ICD-10-CM | POA: Diagnosis present

## 2021-05-12 DIAGNOSIS — Z6823 Body mass index (BMI) 23.0-23.9, adult: Secondary | ICD-10-CM

## 2021-05-12 DIAGNOSIS — K219 Gastro-esophageal reflux disease without esophagitis: Secondary | ICD-10-CM | POA: Diagnosis present

## 2021-05-12 DIAGNOSIS — I700 Atherosclerosis of aorta: Secondary | ICD-10-CM | POA: Diagnosis not present

## 2021-05-12 DIAGNOSIS — K5900 Constipation, unspecified: Secondary | ICD-10-CM | POA: Diagnosis present

## 2021-05-12 DIAGNOSIS — Z888 Allergy status to other drugs, medicaments and biological substances status: Secondary | ICD-10-CM

## 2021-05-12 DIAGNOSIS — Z79899 Other long term (current) drug therapy: Secondary | ICD-10-CM

## 2021-05-12 DIAGNOSIS — R000 Tachycardia, unspecified: Secondary | ICD-10-CM | POA: Diagnosis present

## 2021-05-12 DIAGNOSIS — R0602 Shortness of breath: Secondary | ICD-10-CM | POA: Diagnosis not present

## 2021-05-12 DIAGNOSIS — J9601 Acute respiratory failure with hypoxia: Secondary | ICD-10-CM | POA: Diagnosis present

## 2021-05-12 DIAGNOSIS — Z8619 Personal history of other infectious and parasitic diseases: Secondary | ICD-10-CM

## 2021-05-12 DIAGNOSIS — I5023 Acute on chronic systolic (congestive) heart failure: Secondary | ICD-10-CM | POA: Diagnosis not present

## 2021-05-12 DIAGNOSIS — Z825 Family history of asthma and other chronic lower respiratory diseases: Secondary | ICD-10-CM

## 2021-05-12 DIAGNOSIS — N281 Cyst of kidney, acquired: Secondary | ICD-10-CM | POA: Diagnosis not present

## 2021-05-12 DIAGNOSIS — I517 Cardiomegaly: Secondary | ICD-10-CM | POA: Diagnosis not present

## 2021-05-12 DIAGNOSIS — I110 Hypertensive heart disease with heart failure: Secondary | ICD-10-CM | POA: Diagnosis not present

## 2021-05-12 DIAGNOSIS — M1990 Unspecified osteoarthritis, unspecified site: Secondary | ICD-10-CM | POA: Diagnosis present

## 2021-05-12 DIAGNOSIS — Z8249 Family history of ischemic heart disease and other diseases of the circulatory system: Secondary | ICD-10-CM

## 2021-05-12 DIAGNOSIS — I252 Old myocardial infarction: Secondary | ICD-10-CM

## 2021-05-12 DIAGNOSIS — Z882 Allergy status to sulfonamides status: Secondary | ICD-10-CM

## 2021-05-12 DIAGNOSIS — Z801 Family history of malignant neoplasm of trachea, bronchus and lung: Secondary | ICD-10-CM

## 2021-05-12 DIAGNOSIS — N390 Urinary tract infection, site not specified: Secondary | ICD-10-CM | POA: Diagnosis not present

## 2021-05-12 DIAGNOSIS — R112 Nausea with vomiting, unspecified: Secondary | ICD-10-CM | POA: Diagnosis present

## 2021-05-12 DIAGNOSIS — R109 Unspecified abdominal pain: Secondary | ICD-10-CM | POA: Diagnosis not present

## 2021-05-12 DIAGNOSIS — E876 Hypokalemia: Secondary | ICD-10-CM | POA: Diagnosis present

## 2021-05-12 DIAGNOSIS — J9811 Atelectasis: Secondary | ICD-10-CM | POA: Diagnosis not present

## 2021-05-12 DIAGNOSIS — Z20822 Contact with and (suspected) exposure to covid-19: Secondary | ICD-10-CM | POA: Diagnosis not present

## 2021-05-12 DIAGNOSIS — Z880 Allergy status to penicillin: Secondary | ICD-10-CM

## 2021-05-12 DIAGNOSIS — E871 Hypo-osmolality and hyponatremia: Secondary | ICD-10-CM | POA: Diagnosis not present

## 2021-05-12 DIAGNOSIS — J90 Pleural effusion, not elsewhere classified: Secondary | ICD-10-CM | POA: Diagnosis not present

## 2021-05-12 DIAGNOSIS — Z85828 Personal history of other malignant neoplasm of skin: Secondary | ICD-10-CM

## 2021-05-12 DIAGNOSIS — D509 Iron deficiency anemia, unspecified: Secondary | ICD-10-CM | POA: Diagnosis present

## 2021-05-12 DIAGNOSIS — R54 Age-related physical debility: Secondary | ICD-10-CM | POA: Diagnosis present

## 2021-05-12 DIAGNOSIS — M4854XA Collapsed vertebra, not elsewhere classified, thoracic region, initial encounter for fracture: Secondary | ICD-10-CM | POA: Diagnosis present

## 2021-05-12 DIAGNOSIS — B3741 Candidal cystitis and urethritis: Secondary | ICD-10-CM | POA: Diagnosis present

## 2021-05-12 DIAGNOSIS — I509 Heart failure, unspecified: Secondary | ICD-10-CM | POA: Diagnosis not present

## 2021-05-12 DIAGNOSIS — T502X5A Adverse effect of carbonic-anhydrase inhibitors, benzothiadiazides and other diuretics, initial encounter: Secondary | ICD-10-CM | POA: Diagnosis present

## 2021-05-12 DIAGNOSIS — K388 Other specified diseases of appendix: Secondary | ICD-10-CM | POA: Diagnosis not present

## 2021-05-12 DIAGNOSIS — I2510 Atherosclerotic heart disease of native coronary artery without angina pectoris: Secondary | ICD-10-CM | POA: Diagnosis present

## 2021-05-12 DIAGNOSIS — Z66 Do not resuscitate: Secondary | ICD-10-CM | POA: Diagnosis not present

## 2021-05-12 DIAGNOSIS — N3000 Acute cystitis without hematuria: Secondary | ICD-10-CM | POA: Diagnosis present

## 2021-05-12 DIAGNOSIS — Z803 Family history of malignant neoplasm of breast: Secondary | ICD-10-CM

## 2021-05-12 LAB — BASIC METABOLIC PANEL
Anion gap: 10 (ref 5–15)
BUN: 11 mg/dL (ref 8–23)
CO2: 22 mmol/L (ref 22–32)
Calcium: 9.1 mg/dL (ref 8.9–10.3)
Chloride: 103 mmol/L (ref 98–111)
Creatinine, Ser: 0.68 mg/dL (ref 0.44–1.00)
GFR, Estimated: >60 mL/min (ref >60)
Glucose, Bld: 122 mg/dL — ABNORMAL HIGH (ref 70–99)
Potassium: 2.9 mmol/L — ABNORMAL LOW (ref 3.5–5.1)
Sodium: 135 mmol/L (ref 135–145)

## 2021-05-12 LAB — CBC
HCT: 40 % (ref 36.0–46.0)
Hemoglobin: 13.4 g/dL (ref 12.0–15.0)
MCH: 29.4 pg (ref 26.0–34.0)
MCHC: 33.5 g/dL (ref 30.0–36.0)
MCV: 87.7 fL (ref 80.0–100.0)
Platelets: 201 10*3/uL (ref 150–400)
RBC: 4.56 MIL/uL (ref 3.87–5.11)
RDW: 15.2 % (ref 11.5–15.5)
WBC: 10.1 10*3/uL (ref 4.0–10.5)
nRBC: 0 % (ref 0.0–0.2)

## 2021-05-12 MED ORDER — ONDANSETRON 4 MG PO TBDP
4.0000 mg | ORAL_TABLET | Freq: Once | ORAL | Status: AC
  Administered 2021-05-12: 4 mg via ORAL
  Filled 2021-05-12: qty 1

## 2021-05-12 NOTE — ED Triage Notes (Signed)
Pt here with SOB and nausea that started Friday. Pt's friend states she has been having cramping in her abd and vomiting. Pt in NAD in triage.

## 2021-05-12 NOTE — ED Provider Notes (Signed)
Emergency Medicine Provider Triage Evaluation Note  Teresa Moyer , a 85 y.o. female  was evaluated in triage.  Pt complains of shortness of breath and nausea with onset since Friday.  Patient presents with her in-home caregiver, who notes patient has been complaining of cramping in her abdominal area as well as some associated nonbloody, nonbilious emesis.  Review of Systems  Positive: NV, abd pain Negative: FCS  Physical Exam  BP (!) 169/97   Pulse 86   Temp 98.3 F (36.8 C) (Oral)   Resp 18   Ht 5\' 2"  (1.575 m)   Wt 57.2 kg   SpO2 98%   BMI 23.05 kg/m  Gen:   Awake, no distress  NAD Resp:  Normal effort CTA MSK:   Moves extremities without difficulty  Other:  ABD: Soft, nontender  Medical Decision Making  Medically screening exam initiated at 6:11 PM.  Appropriate orders placed.  Teresa Moyer was informed that the remainder of the evaluation will be completed by another provider, this initial triage assessment does not replace that evaluation, and the importance of remaining in the ED until their evaluation is complete.  Geriatric patient with ED evaluation of symptoms including nausea, vomiting, and shortness of breath.   Melvenia Needles, PA-C 05/12/21 1812    Naaman Plummer, MD 05/13/21 1759

## 2021-05-13 ENCOUNTER — Encounter: Payer: Self-pay | Admitting: Radiology

## 2021-05-13 ENCOUNTER — Emergency Department: Payer: Medicare Other

## 2021-05-13 ENCOUNTER — Inpatient Hospital Stay
Admission: EM | Admit: 2021-05-13 | Discharge: 2021-05-16 | DRG: 291 | Disposition: A | Discharge status: Home Health | Payer: Medicare Other | Attending: Student | Admitting: Student

## 2021-05-13 DIAGNOSIS — N390 Urinary tract infection, site not specified: Secondary | ICD-10-CM | POA: Diagnosis present

## 2021-05-13 DIAGNOSIS — R0602 Shortness of breath: Secondary | ICD-10-CM

## 2021-05-13 DIAGNOSIS — I5022 Chronic systolic (congestive) heart failure: Secondary | ICD-10-CM | POA: Diagnosis present

## 2021-05-13 DIAGNOSIS — R112 Nausea with vomiting, unspecified: Secondary | ICD-10-CM

## 2021-05-13 DIAGNOSIS — I5021 Acute systolic (congestive) heart failure: Secondary | ICD-10-CM

## 2021-05-13 DIAGNOSIS — I509 Heart failure, unspecified: Secondary | ICD-10-CM

## 2021-05-13 DIAGNOSIS — J90 Pleural effusion, not elsewhere classified: Secondary | ICD-10-CM

## 2021-05-13 DIAGNOSIS — B3741 Candidal cystitis and urethritis: Secondary | ICD-10-CM

## 2021-05-13 DIAGNOSIS — R0902 Hypoxemia: Secondary | ICD-10-CM

## 2021-05-13 DIAGNOSIS — J9811 Atelectasis: Secondary | ICD-10-CM | POA: Diagnosis not present

## 2021-05-13 DIAGNOSIS — Z66 Do not resuscitate: Secondary | ICD-10-CM | POA: Diagnosis not present

## 2021-05-13 DIAGNOSIS — E876 Hypokalemia: Secondary | ICD-10-CM | POA: Diagnosis present

## 2021-05-13 DIAGNOSIS — I5023 Acute on chronic systolic (congestive) heart failure: Secondary | ICD-10-CM | POA: Diagnosis present

## 2021-05-13 DIAGNOSIS — K219 Gastro-esophageal reflux disease without esophagitis: Secondary | ICD-10-CM | POA: Diagnosis present

## 2021-05-13 DIAGNOSIS — I517 Cardiomegaly: Secondary | ICD-10-CM | POA: Diagnosis not present

## 2021-05-13 DIAGNOSIS — I2510 Atherosclerotic heart disease of native coronary artery without angina pectoris: Secondary | ICD-10-CM | POA: Diagnosis present

## 2021-05-13 LAB — HEPATIC FUNCTION PANEL
ALT: 13 U/L (ref 0–44)
AST: 23 U/L (ref 15–41)
Albumin: 3.8 g/dL (ref 3.5–5.0)
Alkaline Phosphatase: 95 U/L (ref 38–126)
Bilirubin, Direct: 0.4 mg/dL — ABNORMAL HIGH (ref 0.0–0.2)
Indirect Bilirubin: 3.9 mg/dL — ABNORMAL HIGH (ref 0.3–0.9)
Total Bilirubin: 4.3 mg/dL — ABNORMAL HIGH (ref 0.3–1.2)
Total Protein: 6.8 g/dL (ref 6.5–8.1)

## 2021-05-13 LAB — URINALYSIS, ROUTINE W REFLEX MICROSCOPIC
Glucose, UA: NEGATIVE mg/dL
Nitrite: NEGATIVE
Protein, ur: 100 mg/dL — AB
Specific Gravity, Urine: 1.02 (ref 1.005–1.030)
pH: 5.5 (ref 5.0–8.0)

## 2021-05-13 LAB — RESP PANEL BY RT-PCR (FLU A&B, COVID) ARPGX2
Influenza A by PCR: NEGATIVE
Influenza B by PCR: NEGATIVE
SARS Coronavirus 2 by RT PCR: NEGATIVE

## 2021-05-13 LAB — URINALYSIS, MICROSCOPIC (REFLEX)

## 2021-05-13 LAB — TROPONIN I (HIGH SENSITIVITY)
Troponin I (High Sensitivity): 21 ng/L — ABNORMAL HIGH (ref <18)
Troponin I (High Sensitivity): 22 ng/L — ABNORMAL HIGH (ref <18)

## 2021-05-13 LAB — BRAIN NATRIURETIC PEPTIDE: B Natriuretic Peptide: >4500 pg/mL — ABNORMAL HIGH (ref 0.0–100.0)

## 2021-05-13 LAB — LIPASE, BLOOD: Lipase: 23 U/L (ref 11–51)

## 2021-05-13 LAB — MAGNESIUM: Magnesium: 1.8 mg/dL (ref 1.7–2.4)

## 2021-05-13 MED ORDER — POTASSIUM CHLORIDE CRYS ER 20 MEQ PO TBCR
EXTENDED_RELEASE_TABLET | ORAL | Status: AC
  Filled 2021-05-13: qty 1

## 2021-05-13 MED ORDER — FLUTICASONE PROPIONATE 50 MCG/ACT NA SUSP
1.0000 | Freq: Every day | NASAL | Status: DC
  Administered 2021-05-15 – 2021-05-16 (×2): 1 via NASAL
  Filled 2021-05-13 (×2): qty 16

## 2021-05-13 MED ORDER — SODIUM CHLORIDE 0.9% FLUSH
3.0000 mL | Freq: Two times a day (BID) | INTRAVENOUS | Status: DC
  Administered 2021-05-13 – 2021-05-16 (×6): 3 mL via INTRAVENOUS

## 2021-05-13 MED ORDER — SODIUM CHLORIDE 0.9 % IV SOLN: 250.0000 mL | INTRAVENOUS | Status: DC | PRN

## 2021-05-13 MED ORDER — ATORVASTATIN CALCIUM 20 MG PO TABS
40.0000 mg | ORAL_TABLET | Freq: Every day | ORAL | Status: DC
  Administered 2021-05-14 – 2021-05-15 (×2): 40 mg via ORAL
  Filled 2021-05-13 (×2): qty 2

## 2021-05-13 MED ORDER — ARTIFICIAL TEARS OPHTHALMIC OINT: TOPICAL_OINTMENT | Freq: Every day | OPHTHALMIC | Status: DC

## 2021-05-13 MED ORDER — PANTOPRAZOLE SODIUM 40 MG PO TBEC
40.0000 mg | DELAYED_RELEASE_TABLET | Freq: Every day | ORAL | Status: DC
  Administered 2021-05-14 – 2021-05-16 (×3): 40 mg via ORAL
  Filled 2021-05-13 (×4): qty 1

## 2021-05-13 MED ORDER — SODIUM CHLORIDE 0.9 % IV SOLN
1.0000 g | Freq: Once | INTRAVENOUS | Status: AC
  Administered 2021-05-13: 1 g via INTRAVENOUS

## 2021-05-13 MED ORDER — FUROSEMIDE 10 MG/ML IJ SOLN: 40.0000 mg | Freq: Two times a day (BID) | INTRAMUSCULAR | Status: DC

## 2021-05-13 MED ORDER — CARVEDILOL 6.25 MG PO TABS
12.5000 mg | ORAL_TABLET | Freq: Once | ORAL | Status: AC
  Administered 2021-05-13: 12.5 mg via ORAL

## 2021-05-13 MED ORDER — SODIUM CHLORIDE 0.9 % IV BOLUS
1000.0000 mL | Freq: Once | INTRAVENOUS | Status: AC
  Administered 2021-05-13: 1000 mL via INTRAVENOUS

## 2021-05-13 MED ORDER — POTASSIUM CHLORIDE CRYS ER 20 MEQ PO TBCR
40.0000 meq | EXTENDED_RELEASE_TABLET | Freq: Once | ORAL | Status: AC
  Administered 2021-05-13: 40 meq via ORAL
  Filled 2021-05-13: qty 2

## 2021-05-13 MED ORDER — LORATADINE 10 MG PO TABS
10.0000 mg | ORAL_TABLET | Freq: Every day | ORAL | Status: DC
  Administered 2021-05-14 – 2021-05-16 (×3): 10 mg via ORAL
  Filled 2021-05-13 (×3): qty 1

## 2021-05-13 MED ORDER — ONDANSETRON HCL 4 MG/2ML IJ SOLN: 4.0000 mg | Freq: Four times a day (QID) | INTRAMUSCULAR | Status: DC | PRN

## 2021-05-13 MED ORDER — CARVEDILOL 12.5 MG PO TABS
12.5000 mg | ORAL_TABLET | Freq: Two times a day (BID) | ORAL | Status: DC
  Administered 2021-05-13 – 2021-05-16 (×6): 12.5 mg via ORAL
  Filled 2021-05-13 (×5): qty 1

## 2021-05-13 MED ORDER — TRAMADOL HCL 50 MG PO TABS
50.0000 mg | ORAL_TABLET | Freq: Four times a day (QID) | ORAL | Status: DC | PRN
  Administered 2021-05-15: 50 mg via ORAL
  Filled 2021-05-13: qty 1

## 2021-05-13 MED ORDER — ACETAMINOPHEN 325 MG PO TABS: 650.0000 mg | ORAL_TABLET | Freq: Four times a day (QID) | ORAL | Status: DC | PRN

## 2021-05-13 MED ORDER — FLUCONAZOLE 50 MG PO TABS
150.0000 mg | ORAL_TABLET | Freq: Once | ORAL | Status: AC
  Administered 2021-05-13: 150 mg via ORAL

## 2021-05-13 MED ORDER — ARTIFICIAL TEARS OPHTHALMIC OINT
TOPICAL_OINTMENT | Freq: Every day | OPHTHALMIC | Status: DC
  Filled 2021-05-13 (×2): qty 3.5

## 2021-05-13 MED ORDER — ONDANSETRON HCL 4 MG PO TABS: 4.0000 mg | ORAL_TABLET | Freq: Four times a day (QID) | ORAL | Status: DC | PRN

## 2021-05-13 MED ORDER — FLUCONAZOLE 100 MG PO TABS
ORAL_TABLET | ORAL | Status: AC
  Filled 2021-05-13: qty 1

## 2021-05-13 MED ORDER — FUROSEMIDE 10 MG/ML IJ SOLN
40.0000 mg | Freq: Every day | INTRAMUSCULAR | Status: DC
  Administered 2021-05-14 – 2021-05-16 (×3): 40 mg via INTRAVENOUS
  Filled 2021-05-13 (×4): qty 4

## 2021-05-13 MED ORDER — IOHEXOL 350 MG/ML SOLN
75.0000 mL | Freq: Once | INTRAVENOUS | Status: AC | PRN
  Administered 2021-05-13: 75 mL via INTRAVENOUS

## 2021-05-13 MED ORDER — POTASSIUM CHLORIDE CRYS ER 20 MEQ PO TBCR
40.0000 meq | EXTENDED_RELEASE_TABLET | Freq: Once | ORAL | Status: DC
  Filled 2021-05-13: qty 2

## 2021-05-13 MED ORDER — CARVEDILOL 6.25 MG PO TABS
ORAL_TABLET | ORAL | Status: AC
  Filled 2021-05-13: qty 2

## 2021-05-13 MED ORDER — FLUCONAZOLE 50 MG PO TABS
ORAL_TABLET | ORAL | Status: AC
  Filled 2021-05-13: qty 1

## 2021-05-13 MED ORDER — SODIUM CHLORIDE 0.9% FLUSH: 3.0000 mL | INTRAVENOUS | Status: DC | PRN

## 2021-05-13 MED ORDER — FUROSEMIDE 40 MG PO TABS
40.0000 mg | ORAL_TABLET | Freq: Once | ORAL | Status: AC
  Administered 2021-05-13: 40 mg via ORAL

## 2021-05-13 MED ORDER — POTASSIUM CHLORIDE CRYS ER 20 MEQ PO TBCR
20.0000 meq | EXTENDED_RELEASE_TABLET | Freq: Two times a day (BID) | ORAL | Status: DC
  Administered 2021-05-13: 20 meq via ORAL
  Filled 2021-05-13: qty 2
  Filled 2021-05-13: qty 1
  Filled 2021-05-13: qty 2

## 2021-05-13 MED ORDER — PRESERVISION AREDS PO CAPS: ORAL_CAPSULE | Freq: Two times a day (BID) | ORAL | Status: DC

## 2021-05-13 MED ORDER — HYDROXYZINE PAMOATE 25 MG PO CAPS
25.0000 mg | ORAL_CAPSULE | Freq: Three times a day (TID) | ORAL | Status: DC | PRN
  Filled 2021-05-13: qty 1

## 2021-05-13 MED ORDER — FUROSEMIDE 40 MG PO TABS
ORAL_TABLET | ORAL | Status: AC
  Filled 2021-05-13: qty 1

## 2021-05-13 MED ORDER — RISAQUAD PO CAPS
1.0000 | ORAL_CAPSULE | Freq: Every day | ORAL | Status: DC
  Administered 2021-05-14 – 2021-05-15 (×2): 1 via ORAL
  Filled 2021-05-13 (×2): qty 1

## 2021-05-13 MED ORDER — SODIUM CHLORIDE 0.9 % IV SOLN
INTRAVENOUS | Status: AC
  Filled 2021-05-13: qty 10

## 2021-05-13 MED ORDER — CARBOXYMETHYLCELL-HYPROMELLOSE 0.25-0.3 % OP GEL: 1.0000 "application " | Freq: Every day | OPHTHALMIC | Status: DC

## 2021-05-13 MED ORDER — OCUVITE-LUTEIN PO CAPS
1.0000 | ORAL_CAPSULE | Freq: Every day | ORAL | Status: DC
  Administered 2021-05-14 – 2021-05-16 (×3): 1 via ORAL
  Filled 2021-05-13 (×3): qty 1

## 2021-05-13 MED ORDER — POTASSIUM CHLORIDE CRYS ER 20 MEQ PO TBCR
EXTENDED_RELEASE_TABLET | ORAL | Status: AC
  Filled 2021-05-13: qty 2

## 2021-05-13 MED ORDER — POTASSIUM CHLORIDE 20 MEQ PO PACK
40.0000 meq | PACK | Freq: Once | ORAL | Status: AC
Start: 2021-05-13 — End: 2021-05-13
  Administered 2021-05-13: 40 meq via ORAL
  Filled 2021-05-13: qty 2

## 2021-05-13 MED ORDER — ENOXAPARIN SODIUM 40 MG/0.4ML IJ SOSY
40.0000 mg | PREFILLED_SYRINGE | INTRAMUSCULAR | Status: DC
  Administered 2021-05-13 – 2021-05-16 (×4): 40 mg via SUBCUTANEOUS
  Filled 2021-05-13 (×4): qty 0.4
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
## 2021-05-13 NOTE — ED Notes (Signed)
Patient sleeping at this time. Respirations even and unlabored. NAD noted at this time.

## 2021-05-13 NOTE — ED Notes (Signed)
Patient's bed found wet. Patient cleaned and given new gown/liens. Patient provided with warm blankets. Call light within reach. No needs at this time.

## 2021-05-13 NOTE — ED Notes (Signed)
Nikki RN aware of assigned bed 
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
## 2021-05-14 ENCOUNTER — Inpatient Hospital Stay
Admit: 2021-05-14 | Discharge: 2021-05-14 | Disposition: A | Discharge status: Home / Self Care | Payer: Medicare Other | Attending: Student | Admitting: Student

## 2021-05-14 DIAGNOSIS — Z801 Family history of malignant neoplasm of trachea, bronchus and lung: Secondary | ICD-10-CM | POA: Diagnosis not present

## 2021-05-14 DIAGNOSIS — I5022 Chronic systolic (congestive) heart failure: Secondary | ICD-10-CM | POA: Diagnosis present

## 2021-05-14 DIAGNOSIS — R5381 Other malaise: Secondary | ICD-10-CM | POA: Diagnosis not present

## 2021-05-14 DIAGNOSIS — E876 Hypokalemia: Secondary | ICD-10-CM | POA: Diagnosis present

## 2021-05-14 DIAGNOSIS — N390 Urinary tract infection, site not specified: Secondary | ICD-10-CM | POA: Diagnosis not present

## 2021-05-14 DIAGNOSIS — Z85828 Personal history of other malignant neoplasm of skin: Secondary | ICD-10-CM | POA: Diagnosis not present

## 2021-05-14 DIAGNOSIS — I25118 Atherosclerotic heart disease of native coronary artery with other forms of angina pectoris: Secondary | ICD-10-CM | POA: Diagnosis not present

## 2021-05-14 DIAGNOSIS — Z8249 Family history of ischemic heart disease and other diseases of the circulatory system: Secondary | ICD-10-CM | POA: Diagnosis not present

## 2021-05-14 DIAGNOSIS — I110 Hypertensive heart disease with heart failure: Secondary | ICD-10-CM | POA: Diagnosis present

## 2021-05-14 DIAGNOSIS — K219 Gastro-esophageal reflux disease without esophagitis: Secondary | ICD-10-CM | POA: Diagnosis present

## 2021-05-14 DIAGNOSIS — I5023 Acute on chronic systolic (congestive) heart failure: Secondary | ICD-10-CM | POA: Diagnosis present

## 2021-05-14 DIAGNOSIS — J9601 Acute respiratory failure with hypoxia: Secondary | ICD-10-CM | POA: Diagnosis present

## 2021-05-14 DIAGNOSIS — G8929 Other chronic pain: Secondary | ICD-10-CM | POA: Diagnosis present

## 2021-05-14 DIAGNOSIS — Z825 Family history of asthma and other chronic lower respiratory diseases: Secondary | ICD-10-CM | POA: Diagnosis not present

## 2021-05-14 DIAGNOSIS — I5021 Acute systolic (congestive) heart failure: Secondary | ICD-10-CM | POA: Diagnosis not present

## 2021-05-14 DIAGNOSIS — Z66 Do not resuscitate: Secondary | ICD-10-CM

## 2021-05-14 DIAGNOSIS — E871 Hypo-osmolality and hyponatremia: Secondary | ICD-10-CM | POA: Diagnosis present

## 2021-05-14 DIAGNOSIS — I252 Old myocardial infarction: Secondary | ICD-10-CM | POA: Diagnosis not present

## 2021-05-14 DIAGNOSIS — R0600 Dyspnea, unspecified: Secondary | ICD-10-CM | POA: Diagnosis not present

## 2021-05-14 DIAGNOSIS — M1990 Unspecified osteoarthritis, unspecified site: Secondary | ICD-10-CM | POA: Diagnosis present

## 2021-05-14 DIAGNOSIS — I700 Atherosclerosis of aorta: Secondary | ICD-10-CM | POA: Diagnosis present

## 2021-05-14 DIAGNOSIS — M4854XA Collapsed vertebra, not elsewhere classified, thoracic region, initial encounter for fracture: Secondary | ICD-10-CM | POA: Diagnosis present

## 2021-05-14 DIAGNOSIS — Z20822 Contact with and (suspected) exposure to covid-19: Secondary | ICD-10-CM | POA: Diagnosis present

## 2021-05-14 DIAGNOSIS — D509 Iron deficiency anemia, unspecified: Secondary | ICD-10-CM | POA: Diagnosis present

## 2021-05-14 DIAGNOSIS — B3741 Candidal cystitis and urethritis: Secondary | ICD-10-CM | POA: Diagnosis present

## 2021-05-14 DIAGNOSIS — Z7189 Other specified counseling: Secondary | ICD-10-CM

## 2021-05-14 DIAGNOSIS — I2510 Atherosclerotic heart disease of native coronary artery without angina pectoris: Secondary | ICD-10-CM | POA: Diagnosis present

## 2021-05-14 DIAGNOSIS — Z803 Family history of malignant neoplasm of breast: Secondary | ICD-10-CM | POA: Diagnosis not present

## 2021-05-14 DIAGNOSIS — K5900 Constipation, unspecified: Secondary | ICD-10-CM | POA: Diagnosis not present

## 2021-05-14 DIAGNOSIS — T502X5A Adverse effect of carbonic-anhydrase inhibitors, benzothiadiazides and other diuretics, initial encounter: Secondary | ICD-10-CM | POA: Diagnosis present

## 2021-05-14 DIAGNOSIS — N3000 Acute cystitis without hematuria: Secondary | ICD-10-CM | POA: Diagnosis present

## 2021-05-14 LAB — CBC
HCT: 32.4 % — ABNORMAL LOW (ref 36.0–46.0)
Hemoglobin: 11.1 g/dL — ABNORMAL LOW (ref 12.0–15.0)
MCH: 30.6 pg (ref 26.0–34.0)
MCHC: 34.3 g/dL (ref 30.0–36.0)
MCV: 89.3 fL (ref 80.0–100.0)
Platelets: 153 10*3/uL (ref 150–400)
RBC: 3.63 MIL/uL — ABNORMAL LOW (ref 3.87–5.11)
RDW: 15.4 % (ref 11.5–15.5)
WBC: 8.4 10*3/uL (ref 4.0–10.5)
nRBC: 0 % (ref 0.0–0.2)

## 2021-05-14 LAB — BASIC METABOLIC PANEL
Anion gap: 4 — ABNORMAL LOW (ref 5–15)
BUN: 14 mg/dL (ref 8–23)
CO2: 21 mmol/L — ABNORMAL LOW (ref 22–32)
Calcium: 8.3 mg/dL — ABNORMAL LOW (ref 8.9–10.3)
Chloride: 108 mmol/L (ref 98–111)
Creatinine, Ser: 0.69 mg/dL (ref 0.44–1.00)
GFR, Estimated: >60 mL/min (ref >60)
Glucose, Bld: 110 mg/dL — ABNORMAL HIGH (ref 70–99)
Potassium: 4.1 mmol/L (ref 3.5–5.1)
Sodium: 133 mmol/L — ABNORMAL LOW (ref 135–145)

## 2021-05-14 LAB — CK: Total CK: 34 U/L — ABNORMAL LOW (ref 38–234)

## 2021-05-14 MED ORDER — ADULT MULTIVITAMIN W/MINERALS CH
1.0000 | ORAL_TABLET | Freq: Every day | ORAL | Status: DC
  Administered 2021-05-15 – 2021-05-16 (×2): 1 via ORAL
  Filled 2021-05-14 (×2): qty 1

## 2021-05-14 MED ORDER — ENSURE ENLIVE PO LIQD
237.0000 mL | Freq: Two times a day (BID) | ORAL | Status: DC
  Administered 2021-05-16: 237 mL via ORAL

## 2021-05-14 MED ORDER — SODIUM CHLORIDE 0.9 % IV SOLN
1.0000 g | INTRAVENOUS | Status: AC
  Administered 2021-05-14 – 2021-05-15 (×2): 1 g via INTRAVENOUS
  Filled 2021-05-14 (×2): qty 10

## 2021-05-14 MED ORDER — HYDROXYZINE HCL 25 MG PO TABS
25.0000 mg | ORAL_TABLET | Freq: Three times a day (TID) | ORAL | Status: DC | PRN
  Administered 2021-05-15 – 2021-05-16 (×2): 25 mg via ORAL
  Filled 2021-05-14 (×2): qty 1

## 2021-05-14 NOTE — Progress Notes (Signed)
Initial Nutrition Assessment  DOCUMENTATION CODES:   Not applicable  INTERVENTION:   Ensure Enlive po BID, each supplement provides 350 kcal and 20 grams of protein  Carnation Instant Breakfast BID- each packet provides 130kcal and 5g protein   Magic cup TID with meals, each supplement provides 290 kcal and 9 grams of protein  MVI po daily   Pt at high refeed risk; recommend monitor potassium, magnesium and phosphorus labs daily until stable  NUTRITION DIAGNOSIS:   Inadequate oral intake related to acute illness as evidenced by per patient/family report.  GOAL:   Patient will meet greater than or equal to 90% of their needs  MONITOR:   PO intake, Supplement acceptance, Labs, Weight trends, Skin, I & O's  REASON FOR ASSESSMENT:   Malnutrition Screening Tool    ASSESSMENT:   85 year old female with h/o systolic CHF, CAD, GERD, STEMI, urinary incontinence, OA and HTN who is admitted with acute hypoxic respiratory failure due to acute on chronic systolic CHF and possible UTI.  Met with pt in room today. Pt reports decreased appetite and oral intake for several months pta. Pt also reports some nausea and vomiting pta. Pt denies any nausea today. Pt reports that she is eating 25-100% of meals in hospital. Pt reports ~20lb weight loss pta. Per chart, pt is down 17lbs(12%) over the past 5 months; this is significant. Pt reports that she does not drink supplements at baseline. Pt would like to try Ensure and [employer] in hospital. RD will add supplements and MVI to help pt meet her estimated needs. Pt is at refeed risk.   Medications reviewed and include: risaquad, lovenox, lasix, MVI, ocuvite, protonix, ceftriaxone  Labs reviewed: Na 133(L), K 4.1 wnl   NUTRITION - FOCUSED PHYSICAL EXAM:  Flowsheet Row Most Recent Value  Orbital Region Mild depletion  Upper Arm Region No depletion  Thoracic and Lumbar Region No depletion  Buccal Region No depletion   Temple Region Mild depletion  Clavicle Bone Region Severe depletion  Clavicle and Acromion Bone Region Severe depletion  Scapular Bone Region Mild depletion  Dorsal Hand Severe depletion  Patellar Region No depletion  Anterior Thigh Region No depletion  Posterior Calf Region No depletion  Edema (RD Assessment) Mild  Hair Reviewed  Eyes Reviewed  Mouth Reviewed  Skin Reviewed  Nails Reviewed   Diet Order:   Diet Order             Diet 2 gram sodium Room service appropriate? Yes; Fluid consistency: Thin; Fluid restriction: 1500 mL Fluid  Diet effective now                  EDUCATION NEEDS:   Education needs have been addressed  Skin:  Skin Assessment: Reviewed RN Assessment  Last BM:  pta  Height:   Ht Readings from Last 1 Encounters:  05/12/21 '5\' 2"'  (1.575 m)    Weight:   Wt Readings from Last 1 Encounters:  05/12/21 57.2 kg    Ideal Body Weight:  50 kg  BMI:  Body mass index is 23.05 kg/m.  Estimated Nutritional Needs:   Kcal:  1400-1600kcal/day  Protein:  70-80g/day  Fluid:  1.3-1.5L/day  Koleen Distance MS, RD, LDN Please refer to [facility] for RD and/or RD on-call/weekend/after hours pager

## 2021-05-14 NOTE — TOC Initial Note (Signed)
Transition of Care [facility]) - Initial/Assessment Note    Patient Details  Name: Teresa Moyer MRN: [id number] Date of Birth: April 29, 1936  Transition of Care [facility]) CM/SW Contact:    Candie Chroman, LCSW Phone Number: 05/14/2021, 3:48 PM  Clinical Narrative:  CSW met with patient. No supports at bedside. CSW introduced role and explained that PT recommendations would be discussed. Patient is agreeable to home health. She mentioned [facility]. Referral made and accepted for PT, RN. Patient on acute oxygen. Will follow for potential home need. No further concerns. CSW encouraged patient to contact CSW as needed. CSW will continue to follow patient for support and facilitate return home when stable.                Expected Discharge Plan: [place] Barriers to Discharge: Continued Medical Work up   Patient Goals and CMS Choice   CMS Medicare.gov Compare Post Acute Care list provided to:: Patient Choice offered to / list presented to : Patient  Expected Discharge Plan and Services Expected Discharge Plan: [place] arrangements for the past 2 months: [place] Arranged: RN, PT [facility] Agency: [place] ([facility]) Date [place]: 05/14/21   Representative spoke with at [place]: Floydene Flock  Prior Living Arrangements/Services Living arrangements for the past 2 months: [place] Lives with:: Self Patient language and need for interpreter reviewed:: Yes Do you feel safe going back to the place where you live?: Yes      Need for Family Participation in Patient Care: Yes (Comment) Care giver support system in place?: Yes (comment) Current home services: DME Criminal Activity/Legal Involvement Pertinent to Current Situation/Hospitalization: No - Comment as needed  Activities of Daily Living Home Assistive Devices/Equipment:  Eyeglasses, Gilford Rile (specify type) ADL Screening (condition at time of admission) Patient's cognitive ability adequate to safely complete daily activities?: Yes Is the patient deaf or have difficulty hearing?: No Does the patient have difficulty seeing, even when wearing glasses/contacts?: No Does the patient have difficulty concentrating, remembering, or making decisions?: No Patient able to express need for assistance with ADLs?: Yes Does the patient have difficulty dressing or bathing?: No Independently performs ADLs?: Yes (appropriate for developmental age) Does the patient have difficulty walking or climbing stairs?: Yes Weakness of Legs: Both Weakness of Arms/Hands: None  Permission Sought/Granted Permission sought to share information with : Facility Art therapist granted to share information with : Yes, Verbal Permission Granted     Permission granted to share info w AGENCY: Home Health Agencies        Emotional Assessment Appearance:: Appears stated age Attitude/Demeanor/Rapport: Engaged, Gracious Affect (typically observed): Accepting, Appropriate, Calm, Pleasant Orientation: : Oriented to Self, Oriented to Place, Oriented to  Time, Oriented to Situation Alcohol / Substance Use: Not Applicable Psych Involvement: No (comment)  Admission diagnosis:  Shortness of breath [R06.02] Lower urinary tract infectious disease [N39.0] UTI (urinary tract infection) [N39.0] Pleural effusion [J90] Hypoxia [R09.02] Yeast cystitis [B37.41] Acute on chronic congestive heart failure, unspecified heart failure type (HCC) [I50.9] Nausea and vomiting, unspecified vomiting type [R11.2] Acute on chronic systolic CHF (congestive heart failure) (HCC) [I50.23] Patient Active Problem List   Diagnosis Date Noted   Acute on chronic systolic CHF (congestive  heart failure) ([place]) 05/14/2021   UTI (urinary tract infection) 36/46/8032   Acute systolic CHF (congestive heart failure)  ([place]) 05/13/2021   Hypochloremia    Hypokalemia    Nausea and vomiting    Weakness    Acute hyponatremia 08/31/2020   Acute CHF (congestive heart failure) ([place]) 01/04/2020   Acute blood loss anemia    Hyponatremia    Total knee replacement status 12/20/2019   Acute on chronic combined systolic and diastolic CHF (congestive heart failure) ([place]) 09/06/2019   Acute respiratory failure with hypoxia (HCC) 01/30/2019   Coronary artery disease involving native coronary artery of native heart 01/11/2019   STEMI (ST elevation myocardial infarction) ([place]) 12/27/2018   STEMI involving left anterior descending coronary artery ([place]) 12/27/2018   Acute on chronic cholecystitis 02/16/2018   Gallstones 12/15/2017   Actinic keratosis 12/18/2014   Allergic rhinitis 12/18/2014   Arthritis 12/18/2014   Basal cell carcinoma of skin 12/18/2014   Gonalgia 12/18/2014   Narrowing of intervertebral disc space 12/18/2014   Hypertension 12/18/2014   Gastro-esophageal reflux disease without esophagitis 12/18/2014   HLD (hyperlipidemia) 12/18/2014   Cardiac murmur 12/18/2014   Arthritis, degenerative 12/18/2014   Hypertonicity of bladder 12/18/2014   Det[place]or muscle hypertonia 12/18/2014   Heart valve disease 12/18/2014   Asymptomatic varicose veins 12/18/2014   PCP:  Jerrol Banana., MD Pharmacy:   [place], [place] [address] Phone: [phone] Fax: [phone]     Social Determinants of Health (SDOH) Interventions    Readmission Risk Interventions No flowsheet data found.

## 2021-05-14 NOTE — Evaluation (Signed)
Physical Therapy Evaluation Patient Details Name: Teresa Moyer MRN: [id number] DOB: 1935-07-22 Today's Date: 05/14/2021  History of Present Illness  Teresa Moyer is an 6yoF who comes to [facility] on 11/28 c hypoxia, BNP here at 4500+, admitted in CHF exacerbation. PMH of systolic CHF, CAD, GERD, urinary incontinence, OA and HTN presenting with nausea, vomiting, abdominal cramp, DOE, edema and dysuria. Pt lives alone at [place], support from 2 sons, uses  a 4WW for mostly household distance AMB, no falls history, has an aide in home a few days /week and has mild ADL limitations from VI 2/2 macular degeneration.  Clinical Impression  Pt admitted with above diagnosis. Pt currently with functional limitations due to the deficits listed below (see "PT Problem List"). Upon entry, pt in bed, awake and agreeable to participate. The pt is alert, pleasant, interactive, and able to provide info regarding prior level of function, both in tolerance and independence. Pt reports significant urine output in past 24hours. Pt at 92% on room air resting, 86% after AMB on room air, 93% AMB on 2L/min. Pt very near physical baseline for independence of mobility but does have some LOB with turning in hallway, although typically uses a different style walker. Pt denies acute weakness in session, but is fatigued after 2 separate walks. Patient's performance this date reveals decreased ability, independence, and tolerance in performing all basic mobility required for performance of activities of daily living. Pt requires additional DME, close physical assistance, and cues for safe participate in mobility. Pt will benefit from skilled PT intervention to increase independence and safety with basic mobility in preparation for discharge to the venue listed below.          Recommendations for follow up therapy are one component of a multi-disciplinary discharge planning process, led by the attending physician.  Recommendations may be  updated based on patient status, additional functional criteria and insurance authorization.  Follow Up Recommendations Home health PT    Assistance Recommended at Discharge Set up Supervision/Assistance  Functional Status Assessment Patient has had a recent decline in their functional status and demonstrates the ability to make significant improvements in function in a reasonable and predictable amount of time.  Equipment Recommendations  None recommended by PT    Recommendations for Other Services       Precautions / Restrictions Precautions Precautions: Fall Restrictions Weight Bearing Restrictions: No      Mobility  Bed Mobility Overal bed mobility: Modified Independent             General bed mobility comments: additional time    Transfers Overall transfer level: Needs assistance Equipment used: None;Rolling walker (2 wheels) Transfers: Sit to/from Stand;Bed to chair/wheelchair/BSC Sit to Stand: Supervision Stand pivot transfers: Supervision         General transfer comment: no device bed to [facility]; RW from [facility] and bed.    Ambulation/Gait Ambulation/Gait assistance: Min guard Gait Distance (Feet): 100 Feet Assistive device: Rolling walker (2 wheels)   Gait velocity: 0.66m/s.     General Gait Details: moderate baseline postural limitations, kyphosis and forward flexed posture; 140ft on RA at 86% (recoery to 92% after 2 minutes); then 138ft on 2L/min at 93%.  Stairs            Wheelchair Mobility    Modified Rankin (Stroke Patients Only)       Balance Overall balance assessment: Mild deficits observed, not formally tested (1 LOB while u-turing)  Pertinent Vitals/Pain Pain Assessment: No/denies pain    Home Living Family/patient expects to be discharged to:: Private residence Living Arrangements: Alone Available Help at Discharge: Family (2 sons) Type of Home: House Home Access:  Stairs to enter Entrance Stairs-Rails: Psychiatric nurse of Steps: 4 in back wack with rails adn visual aids   Home Layout: One level Home Equipment: Rollator (4 wheels);BSC/3in1;Rolling Walker (2 wheels);Cane - single point      Prior Function                 ADLs Comments: aide helps with washing back. otherwise only limited by macular degerneration     Hand Dominance        Extremity/Trunk Assessment   Upper Extremity Assessment Upper Extremity Assessment: Overall WFL for tasks assessed;Generalized weakness    Lower Extremity Assessment Lower Extremity Assessment: Overall WFL for tasks assessed;Generalized weakness       Communication      Cognition Arousal/Alertness: Awake/alert Behavior During Therapy: WFL for tasks assessed/performed Overall Cognitive Status: Within Functional Limits for tasks assessed                                          General Comments      Exercises     Assessment/Plan    PT Assessment Patient needs continued PT services  PT Problem List Cardiopulmonary status limiting activity;Decreased mobility;Decreased balance;Decreased activity tolerance;Decreased safety awareness;Decreased knowledge of precautions;Decreased knowledge of use of DME       PT Treatment Interventions DME instruction;Balance training;Stair training;Functional mobility training;Therapeutic activities;Therapeutic exercise;Patient/family education    PT Goals (Current goals can be found in the Care Plan section)  Acute Rehab PT Goals Patient Stated Goal: be able to AMB at PLOF without O2 PT Goal Formulation: With patient Time For Goal Achievement: 05/28/21 Potential to Achieve Goals: Good    Frequency Min 2X/week   Barriers to discharge        Co-evaluation               AM-PAC PT "6 Clicks" Mobility  Outcome Measure Help needed turning from your back to your side while in a flat bed without using bedrails?: A  Little Help needed moving from lying on your back to sitting on the side of a flat bed without using bedrails?: A Little Help needed moving to and from a bed to a chair (including a wheelchair)?: A Little Help needed standing up from a chair using your arms (e.g., wheelchair or bedside chair)?: A Little Help needed to walk in hospital room?: A Little Help needed climbing 3-5 steps with a railing? : A Little 6 Click Score: 18    End of Session Equipment Utilized During Treatment: Gait belt;Oxygen Activity Tolerance: Patient tolerated treatment well;Patient limited by fatigue Patient left: in bed;with nursing/sitter in room;with call bell/phone within reach Nurse Communication: Mobility status (pee can full, IV driiping blood, successful gas wean) PT Visit Diagnosis: Other abnormalities of gait and mobility (R26.89);Muscle weakness (generalized) (M62.81)    Time: 1856-3149 PT Time Calculation (min) (ACUTE ONLY): 38 min   Charges:   PT Evaluation $PT Eval Moderate Complexity: 1 Mod PT Treatments $Therapeutic Activity: 23-37 mins      2:09 PM, 05/14/21 Etta Grandchild, PT, DPT Physical Therapist - [facility]  [phone] ([place])    [place] C 05/14/2021, 2:06 PM

## 2021-05-14 NOTE — Progress Notes (Signed)
PROGRESS NOTE  Teresa Moyer DGL:[id number] DOB: 11/13/1935   PCP: Jerrol Banana., MD  Patient is from: Home.  Lives alone.  Has caretaker.  Uses walker at baseline.  DOA: 05/13/2021 LOS: 0  Chief complaints:  Chief Complaint  Patient presents with   Shortness of Breath   Nausea     Brief Narrative / Interim history: 85 year old F with PMH of systolic CHF, CAD, GERD, urinary incontinence, OA and HTN presenting with nausea, vomiting, abdominal cramp, DOE, edema and dysuria, and admitted for acute hypoxic respiratory failure due to acute on chronic systolic CHF and possible UTI.  Reportedly desaturated to 87% on RA requiring supplemental oxygen.  BNP > 4500.  CXR and CTA chest/abdomen/pelvis with small bilateral pleural effusion.  UA with trace LE and large Hgb but no RBCs.  Started on IV Lasix and ceftriaxone.  Urine culture pending.  Subjective: Seen and examined earlier this morning.  No major events overnight of this morning.  Feels better from breathing standpoint but not quite back to her baseline.  Dysuria has improved as well.  She denies chest pain, nausea, vomiting or abdominal pain.  Objective: Vitals:   05/13/21 1854 05/13/21 2029 05/14/21 0558 05/14/21 0800  BP: (!) 146/85 (!) 142/79 140/76 128/75  Pulse: 77 68 65 65  Resp:  18 18 18   Temp: 97.9 F (36.6 C) 98.7 F (37.1 C) 98 F (36.7 C) 98.1 F (36.7 C)  TempSrc: Oral Oral Oral Oral  SpO2: 98% 99% 99% 98%  Weight:      Height:        Intake/Output Summary (Last 24 hours) at 05/14/2021 1248 Last data filed at 05/14/2021 1049 Gross per 24 hour  Intake 365 ml  Output 400 ml  Net -35 ml   Filed Weights   05/12/21 1439  Weight: 57.2 kg    Examination:  GENERAL: No apparent distress.  Nontoxic. HEENT: MMM.  Vision and hearing grossly intact.  NECK: Supple.  Notable JVD. RESP:  No IWOB.  Fair aeration bilaterally.  Crackles over right lung base. CVS:  RRR. Heart sounds normal.  2/6 SEM over  apex. ABD/GI/GU: BS+. Abd soft, NTND.  MSK/EXT:  Moves extremities. No apparent deformity.  Trace edema in BLE. SKIN: no apparent skin lesion or wound NEURO: Awake, alert and oriented appropriately.  No apparent focal neuro deficit. PSYCH: Calm. Normal affect.   Procedures:  None  Microbiology summarized: JJOAC-16 and influenza PCR nonreactive. Urine culture pending.  Assessment & Plan: Acute hypoxic respiratory failure likely due to acute on chronic systolic CHF: Patient admits to dietary indiscretion during Thanksgiving holiday.  BNP> 4500.  CXR and CT chest consistent with CHF.  TTE in 2020 with LVEF of 45 to 50%, mild concentric LVH, impaired relaxation, symptoms improved with diuretics.  On Lasix and metolazone at home.  I&O not captured well.  Renal function stable. -Wean oxygen as able -Continue IV Lasix 40 mg daily -Monitor fluid status, renal functions and electrolytes. -Update TTE. -Counseled on sodium and fluid restriction -IS/OOB/PT/OT  Acute cystitis: she reports dysuria.  UA with trace LE and large Hgb but no significant RBC. -Follow urine culture -Continue IV ceftriaxone -Check CK  History of CAD: No anginal symptoms. -Continue home meds  Hypokalemia: Resolved.  GERD: -Continue PPI.  Chronic back pain/osteoarthritis -Continue home meds.  Goal of care counseling: After extensive discussion about CODE STATUS and pros and cons of CPR and intubation at bedside, patient prefers to be DNR/DNI -CODE STATUS changed.  Body mass index is 23.05 kg/m.         DVT prophylaxis:  enoxaparin (LOVENOX) injection 40 mg Start: 05/13/21 1100  Code Status: DNR/DNI Family Communication: Updated patient's caregiver at bedside. Level of care: Med-Surg Status is: Observation  The patient will require care spanning > 2 midnights and should be moved to inpatient because: Acute respiratory failure with hypoxia due to CHF exacerbation requiring IV fluid and acute  cystitis IV antibiotics pending urine cultures.      Consultants:  None   Sch Meds:  Scheduled Meds:  acidophilus  1 capsule Oral QPC lunch   artificial tears   Both Eyes QHS   atorvastatin  40 mg Oral q1800   carvedilol  12.5 mg Oral BID WC   enoxaparin (LOVENOX) injection  40 mg Subcutaneous Q24H   fluticasone  1 spray Each Nare Daily   furosemide  40 mg Intravenous Daily   loratadine  10 mg Oral Daily   multivitamin-lutein  1 capsule Oral Daily   pantoprazole  40 mg Oral Daily   sodium chloride flush  3 mL Intravenous Q12H   Continuous Infusions:  sodium chloride     cefTRIAXone (ROCEPHIN)  IV 1 g (05/14/21 1100)   PRN Meds:.sodium chloride, acetaminophen, hydrOXYzine, ondansetron **OR** ondansetron (ZOFRAN) IV, sodium chloride flush, traMADol  Antimicrobials: Anti-infectives (From admission, onward)    Start     Dose/Rate Route Frequency Ordered Stop   05/14/21 1100  cefTRIAXone (ROCEPHIN) 1 g in sodium chloride 0.9 % 100 mL IVPB        1 g 200 mL/hr over 30 Minutes Intravenous Every 24 hours 05/14/21 1014     05/13/21 0357  fluconazole (DIFLUCAN) 50 MG tablet       Note to Pharmacy: Celine Mans B: cabinet override      05/13/21 0357 05/13/21 1559   05/13/21 0357  fluconazole (DIFLUCAN) 100 MG tablet       Note to Pharmacy: Celine Mans B: cabinet override      05/13/21 0357 05/13/21 1559   05/13/21 0357  sodium chloride 0.9 % with cefTRIAXone (ROCEPHIN) ADS Med       Note to Pharmacy: Celine Mans B: cabinet override      05/13/21 0357 05/13/21 1559   05/13/21 0345  fluconazole (DIFLUCAN) tablet 150 mg        150 mg Oral  Once 05/13/21 0332 05/13/21 0400   05/13/21 0345  cefTRIAXone (ROCEPHIN) 1 g in sodium chloride 0.9 % 100 mL IVPB        1 g 200 mL/hr over 30 Minutes Intravenous  Once 05/13/21 0341 05/13/21 0437        I have personally reviewed the following labs and images: CBC: Recent Labs  Lab 05/12/21 1437 05/14/21 0533   WBC 10.1 8.4  HGB 13.4 11.1*  HCT 40.0 32.4*  MCV 87.7 89.3  PLT 201 153   BMP &GFR Recent Labs  Lab 05/12/21 1437 05/13/21 0242 05/14/21 0533  NA 135  --  133*  K 2.9*  --  4.1  CL 103  --  108  CO2 22  --  21*  GLUCOSE 122*  --  110*  BUN 11  --  14  CREATININE 0.68  --  0.69  CALCIUM 9.1  --  8.3*  MG  --  1.8  --    Estimated Creatinine Clearance: 42.1 mL/min (by C-G formula based on SCr of 0.69 mg/dL). Liver & Pancreas: Recent Labs  Lab 05/12/21  1437  AST 23  ALT 13  ALKPHOS 95  BILITOT 4.3*  PROT 6.8  ALBUMIN 3.8   Recent Labs  Lab 05/12/21 1437  LIPASE 23   No results for input(s): AMMONIA in the last 168 hours. Diabetic: No results for input(s): HGBA1C in the last 72 hours. No results for input(s): GLUCAP in the last 168 hours. Cardiac Enzymes: No results for input(s): CKTOTAL, CKMB, CKMBINDEX, TROPONINI in the last 168 hours. No results for input(s): PROBNP in the last 8760 hours. Coagulation Profile: No results for input(s): INR, PROTIME in the last 168 hours. Thyroid Function Tests: No results for input(s): TSH, T4TOTAL, FREET4, T3FREE, THYROIDAB in the last 72 hours. Lipid Profile: No results for input(s): CHOL, HDL, LDLCALC, TRIG, CHOLHDL, LDLDIRECT in the last 72 hours. Anemia Panel: No results for input(s): VITAMINB12, FOLATE, FERRITIN, TIBC, IRON, RETICCTPCT in the last 72 hours. Urine analysis:    Component Value Date/Time   COLORURINE YELLOW 05/13/2021 0242   APPEARANCEUR CLOUDY (A) 05/13/2021 0242   LABSPEC 1.020 05/13/2021 0242   PHURINE 5.5 05/13/2021 0242   GLUCOSEU NEGATIVE 05/13/2021 0242   HGBUR LARGE (A) 05/13/2021 0242   BILIRUBINUR SMALL (A) 05/13/2021 0242   BILIRUBINUR negative 10/09/2020 1307   KETONESUR TRACE (A) 05/13/2021 0242   PROTEINUR 100 (A) 05/13/2021 0242   UROBILINOGEN 0.2 10/09/2020 1307   UROBILINOGEN 0.2 04/15/2009 0859   NITRITE NEGATIVE 05/13/2021 0242   LEUKOCYTESUR TRACE (A) 05/13/2021 0242    Sepsis Labs: Invalid input(s): PROCALCITONIN, [place]  Microbiology: Recent Results (from the past 240 hour(s))  Resp Panel by RT-PCR (Flu A&B, Covid) Nasopharyngeal Swab     Status: None   Collection Time: 05/13/21  2:42 AM   Specimen: Nasopharyngeal Swab; Nasopharyngeal(NP) swabs in vial transport medium  Result Value Ref Range Status   SARS Coronavirus 2 by RT PCR NEGATIVE NEGATIVE Final    Comment: (NOTE) SARS-CoV-2 target nucleic acids are NOT DETECTED.  The SARS-CoV-2 RNA is generally detectable in upper respiratory specimens during the acute phase of infection. The lowest concentration of SARS-CoV-2 viral copies this assay can detect is 138 copies/mL. A negative result does not preclude SARS-Cov-2 infection and should not be used as the sole basis for treatment or other patient management decisions. A negative result may occur with  improper specimen collection/handling, submission of specimen other than nasopharyngeal swab, presence of viral mutation(s) within the areas targeted by this assay, and inadequate number of viral copies(<138 copies/mL). A negative result must be combined with clinical observations, patient history, and epidemiological information. The expected result is Negative.  Fact Sheet for Patients:  EntrepreneurPulse.com.au  Fact Sheet for Healthcare Providers:  IncredibleEmployment.be  This test is no t yet approved or cleared by the [place] FDA and  has been authorized for detection and/or diagnosis of SARS-CoV-2 by FDA under an Emergency Use Authorization (EUA). This EUA will remain  in effect (meaning this test can be used) for the duration of the COVID-19 declaration under Section 564(b)(1) of the Act, 21 U.S.C.section 360bbb-3(b)(1), unless the authorization is terminated  or revoked sooner.       Influenza A by PCR NEGATIVE NEGATIVE Final   Influenza B by PCR NEGATIVE NEGATIVE Final     Comment: (NOTE) The Xpert Xpress SARS-CoV-2/FLU/RSV plus assay is intended as an aid in the diagnosis of influenza from Nasopharyngeal swab specimens and should not be used as a sole basis for treatment. Nasal washings and aspirates are unacceptable for Xpert Xpress SARS-CoV-2/FLU/RSV testing.  Fact  Sheet for Patients: EntrepreneurPulse.com.au  Fact Sheet for Healthcare Providers: IncredibleEmployment.be  This test is not yet approved or cleared by the [place] FDA and has been authorized for detection and/or diagnosis of SARS-CoV-2 by FDA under an Emergency Use Authorization (EUA). This EUA will remain in effect (meaning this test can be used) for the duration of the COVID-19 declaration under Section 564(b)(1) of the Act, 21 U.S.C. section 360bbb-3(b)(1), unless the authorization is terminated or revoked.  Performed at [facility], [address], [place], [place] [address]   Urine Culture     Status: None (Preliminary result)   Collection Time: 05/13/21  2:42 AM   Specimen: Urine, Clean Catch  Result Value Ref Range Status   Specimen Description   Final    URINE, CLEAN CATCH Performed at [facility], [address], [place], [place] [address]    Special Requests   Final    NONE Performed at [facility], [address] [place], [place] [address]    Culture   Final    CULTURE REINCUBATED FOR BETTER GROWTH Performed at [facility], [place] [address] [place], [place] [address]    Report Status PENDING  Incomplete    Radiology Studies: No results found.  55 minutes with more than 50% spent in reviewing records, counseling patient/family and coordinating care.   Tannah Dreyfuss T. Grady  If 7PM-7AM, please contact night-coverage [email or web address] 05/14/2021, 12:48 PM

## 2021-05-14 NOTE — Progress Notes (Signed)
OT Cancellation Note  Patient Details Name: Teresa Moyer MRN: [id number] DOB: 22-Oct-1935   Cancelled Treatment:    Reason Eval/Treat Not Completed: OT screened, no needs identified, will sign off. Order received, chart reviewed. Per conversation with PT and pt, pt near baseline functional independence. No skilled OT needs identified. Will sign off. Please re-consult if additional needs arise.   Dessie Coma, M.S. OTR/L  05/14/21, 3:19 PM  ascom [phone]

## 2021-05-15 DIAGNOSIS — I5021 Acute systolic (congestive) heart failure: Secondary | ICD-10-CM | POA: Diagnosis not present

## 2021-05-15 DIAGNOSIS — Z7189 Other specified counseling: Secondary | ICD-10-CM | POA: Diagnosis not present

## 2021-05-15 DIAGNOSIS — K5900 Constipation, unspecified: Secondary | ICD-10-CM

## 2021-05-15 DIAGNOSIS — N390 Urinary tract infection, site not specified: Secondary | ICD-10-CM | POA: Diagnosis not present

## 2021-05-15 DIAGNOSIS — Z66 Do not resuscitate: Secondary | ICD-10-CM | POA: Diagnosis not present

## 2021-05-15 LAB — RENAL FUNCTION PANEL
Albumin: 3 g/dL — ABNORMAL LOW (ref 3.5–5.0)
Anion gap: 5 (ref 5–15)
BUN: 14 mg/dL (ref 8–23)
CO2: 23 mmol/L (ref 22–32)
Calcium: 8.2 mg/dL — ABNORMAL LOW (ref 8.9–10.3)
Chloride: 106 mmol/L (ref 98–111)
Creatinine, Ser: 0.68 mg/dL (ref 0.44–1.00)
GFR, Estimated: >60 mL/min (ref >60)
Glucose, Bld: 107 mg/dL — ABNORMAL HIGH (ref 70–99)
Phosphorus: 4.3 mg/dL (ref 2.5–4.6)
Potassium: 3.6 mmol/L (ref 3.5–5.1)
Sodium: 134 mmol/L — ABNORMAL LOW (ref 135–145)

## 2021-05-15 LAB — CBC
HCT: 31.8 % — ABNORMAL LOW (ref 36.0–46.0)
Hemoglobin: 10.5 g/dL — ABNORMAL LOW (ref 12.0–15.0)
MCH: 29.6 pg (ref 26.0–34.0)
MCHC: 33 g/dL (ref 30.0–36.0)
MCV: 89.6 fL (ref 80.0–100.0)
Platelets: 155 10*3/uL (ref 150–400)
RBC: 3.55 MIL/uL — ABNORMAL LOW (ref 3.87–5.11)
RDW: 15.3 % (ref 11.5–15.5)
WBC: 8.6 10*3/uL (ref 4.0–10.5)
nRBC: 0 % (ref 0.0–0.2)

## 2021-05-15 LAB — URINE CULTURE: Culture: >=100000 — AB

## 2021-05-15 LAB — MAGNESIUM: Magnesium: 1.7 mg/dL (ref 1.7–2.4)

## 2021-05-15 LAB — BRAIN NATRIURETIC PEPTIDE: B Natriuretic Peptide: 2481.3 pg/mL — ABNORMAL HIGH (ref 0.0–100.0)

## 2021-05-15 MED ORDER — SENNOSIDES-DOCUSATE SODIUM 8.6-50 MG PO TABS
1.0000 | ORAL_TABLET | Freq: Two times a day (BID) | ORAL | Status: DC | PRN
  Administered 2021-05-15: 1 via ORAL
  Filled 2021-05-15: qty 1

## 2021-05-15 MED ORDER — POTASSIUM CHLORIDE 20 MEQ PO PACK
40.0000 meq | PACK | Freq: Once | ORAL | Status: AC
  Administered 2021-05-15: 40 meq via ORAL
  Filled 2021-05-15: qty 2

## 2021-05-15 MED ORDER — MAGNESIUM SULFATE 2 GM/50ML IV SOLN
2.0000 g | Freq: Once | INTRAVENOUS | Status: AC
  Administered 2021-05-15: 2 g via INTRAVENOUS
  Filled 2021-05-15: qty 50

## 2021-05-15 MED ORDER — POLYETHYLENE GLYCOL 3350 17 G PO PACK
17.0000 g | PACK | Freq: Two times a day (BID) | ORAL | Status: DC | PRN
  Administered 2021-05-15: 17 g via ORAL
  Filled 2021-05-15: qty 1

## 2021-05-15 NOTE — Progress Notes (Signed)
PROGRESS NOTE  Teresa Moyer CXK:[id number] DOB: 1935-07-04   PCP: Jerrol Banana., MD  Patient is from: Home.  Lives alone.  Has caretaker.  Uses walker at baseline.  DOA: 05/13/2021 LOS: 1  Chief complaints:  Chief Complaint  Patient presents with   Shortness of Breath   Nausea     Brief Narrative / Interim history: 85 year old F with PMH of systolic CHF, CAD, GERD, urinary incontinence, OA and HTN presenting with nausea, vomiting, abdominal cramp, DOE, edema and dysuria, and admitted for acute hypoxic respiratory failure due to acute on chronic systolic CHF and possible UTI.  Reportedly desaturated to 87% on RA requiring supplemental oxygen.  BNP > 4500.  CXR and CTA chest/abdomen/pelvis with small bilateral pleural effusion.  UA with trace LE and large Hgb but no RBCs.  Started on IV Lasix and ceftriaxone.  Urine culture with Aerococcus urinae.  Sensitivity pending.  Subjective: Seen and examined earlier this morning.  No major events overnight of this morning.  No complaints.  She denies chest pain, shortness of breath, GI or UTI symptoms other than chronic incontinence.  Dysuria resolved.  Has not had a bowel movement in 2 days.  Objective: Vitals:   05/15/21 0410 05/15/21 0700 05/15/21 0742 05/15/21 0900  BP: 133/62  (!) 143/89   Pulse: 67  68   Resp: 16 12 18 19   Temp: 98.1 F (36.7 C)  98.3 F (36.8 C)   TempSrc:   Oral   SpO2: 96%  95%   Weight:      Height:        Intake/Output Summary (Last 24 hours) at 05/15/2021 1415 Last data filed at 05/15/2021 1409 Gross per 24 hour  Intake 1180 ml  Output 2900 ml  Net -1720 ml   Filed Weights   05/12/21 1439  Weight: 57.2 kg    Examination:  GENERAL: No apparent distress.  Nontoxic. HEENT: MMM.  Vision and hearing grossly intact.  NECK: Supple.  No apparent JVD.  RESP: 96% on 2 L.  No IWOB.  Fair aeration bilaterally. CVS:  RRR.  2/6 SEM over apex of the heart. ABD/GI/GU: BS+. Abd soft, NTND.   MSK/EXT:  Moves extremities. No apparent deformity.  Trace edema in BLE. SKIN: no apparent skin lesion or wound NEURO: Awake and alert. Oriented appropriately.  No apparent focal neuro deficit. PSYCH: Calm. Normal affect.   Procedures:  None  Microbiology summarized: [id number] and influenza PCR nonreactive. Urine culture pending.  Assessment & Plan: Acute hypoxic respiratory failure likely due to acute on chronic systolic CHF: Patient admits to dietary indiscretion during Thanksgiving holiday.  BNP> 4500.  CXR and CT chest consistent with CHF.  TTE in 2020 with LVEF of 45 to 50%, mild concentric LVH, impaired relaxation, symptoms improved with diuretics.  On Lasix and metolazone at home.  Had 1.6 L UOP during the day yesterday.  Also some unmeasured voids.  BNP improved to 2500.  Hyponatremia improved as well.  Creatinine stable. -Wean oxygen as able -Continue IV Lasix 40 mg daily -Follow repeat TTE. -Monitor fluid status, renal functions and electrolytes. -Counseled on sodium and fluid restriction -IS/OOB/PT/OT  Acute cystitis: she reports dysuria.  UA with trace LE and large Hgb but no significant RBC.  Urine culture with Aerococcus urinae -IV ceftriaxone for 3 days per ID  History of CAD: No anginal symptoms. -Continue home meds  Hypokalemia: K3.6.  Mg 1.7. -P.o. K-Dur 40x1 -IV magnesium sulfate  Normocytic anemia: Hgb slightly down.  No report of melena or hematochezia. Recent Labs    08/31/20 1811 09/01/20 0602 09/02/20 0301 09/03/20 0404 09/04/20 0422 09/16/20 1049 03/04/21 1049 05/12/21 1437 05/14/21 0533 05/15/21 0506  HGB 12.8 12.2 12.0 11.1* 10.7* 10.8* 12.5 13.4 11.1* 10.5*  -Check anemia panel in the morning -Continue monitoring   GERD: -Continue PPI.  Chronic back pain/osteoarthritis -Continue home meds.  Constipation. -Bowel regimen ordered.  Goal of care counseling: After extensive discussion about CODE STATUS and pros and cons of CPR and  intubation at bedside, patient prefers to be DNR/DNI -CODE STATUS changed.  Body mass index is 23.05 kg/m. Nutrition Problem: Inadequate oral intake Etiology: acute illness Signs/Symptoms: per patient/family report     DVT prophylaxis:  enoxaparin (LOVENOX) injection 40 mg Start: 05/13/21 1100  Code Status: DNR/DNI Family Communication: Updated patient's caregiver at bedside. Level of care: Telemetry Medical Status is: Inpatient  The patient will remain inpatient because: Acute respiratory failure with hypoxia due to CHF exacerbation requiring IV diuretics for further diuresis.   Consultants:  None   Sch Meds:  Scheduled Meds:  acidophilus  1 capsule Oral QPC lunch   artificial tears   Both Eyes QHS   atorvastatin  40 mg Oral q1800   carvedilol  12.5 mg Oral BID WC   enoxaparin (LOVENOX) injection  40 mg Subcutaneous Q24H   feeding supplement  237 mL Oral BID BM   fluticasone  1 spray Each Nare Daily   furosemide  40 mg Intravenous Daily   loratadine  10 mg Oral Daily   multivitamin with minerals  1 tablet Oral Daily   multivitamin-lutein  1 capsule Oral Daily   pantoprazole  40 mg Oral Daily   sodium chloride flush  3 mL Intravenous Q12H   Continuous Infusions:  sodium chloride     PRN Meds:.sodium chloride, acetaminophen, hydrOXYzine, ondansetron **OR** ondansetron (ZOFRAN) IV, polyethylene glycol, senna-docusate, sodium chloride flush, traMADol  Antimicrobials: Anti-infectives (From admission, onward)    Start     Dose/Rate Route Frequency Ordered Stop   05/14/21 1100  cefTRIAXone (ROCEPHIN) 1 g in sodium chloride 0.9 % 100 mL IVPB        1 g 200 mL/hr over 30 Minutes Intravenous Every 24 hours 05/14/21 1014 05/15/21 1231   05/13/21 0357  fluconazole (DIFLUCAN) 50 MG tablet       Note to Pharmacy: Celine Mans B: cabinet override      05/13/21 0357 05/13/21 1559   05/13/21 0357  fluconazole (DIFLUCAN) 100 MG tablet       Note to Pharmacy:  Celine Mans B: cabinet override      05/13/21 0357 05/13/21 1559   05/13/21 0357  sodium chloride 0.9 % with cefTRIAXone (ROCEPHIN) ADS Med       Note to Pharmacy: Celine Mans B: cabinet override      05/13/21 0357 05/13/21 1559   05/13/21 0345  fluconazole (DIFLUCAN) tablet 150 mg        150 mg Oral  Once 05/13/21 0332 05/13/21 0400   05/13/21 0345  cefTRIAXone (ROCEPHIN) 1 g in sodium chloride 0.9 % 100 mL IVPB        1 g 200 mL/hr over 30 Minutes Intravenous  Once 05/13/21 0341 05/13/21 0437        I have personally reviewed the following labs and images: CBC: Recent Labs  Lab 05/12/21 1437 05/14/21 0533 05/15/21 0506  WBC 10.1 8.4 8.6  HGB 13.4 11.1* 10.5*  HCT 40.0 32.4* 31.8*  MCV 87.7  89.3 89.6  PLT 201 153 155   BMP &GFR Recent Labs  Lab 05/12/21 1437 05/13/21 0242 05/14/21 0533 05/15/21 0506  NA 135  --  133* 134*  K 2.9*  --  4.1 3.6  CL 103  --  108 106  CO2 22  --  21* 23  GLUCOSE 122*  --  110* 107*  BUN 11  --  14 14  CREATININE 0.68  --  0.69 0.68  CALCIUM 9.1  --  8.3* 8.2*  MG  --  1.8  --  1.7  PHOS  --   --   --  4.3   Estimated Creatinine Clearance: 42.1 mL/min (by C-G formula based on SCr of 0.68 mg/dL). Liver & Pancreas: Recent Labs  Lab 05/12/21 1437 05/15/21 0506  AST 23  --   ALT 13  --   ALKPHOS 95  --   BILITOT 4.3*  --   PROT 6.8  --   ALBUMIN 3.8 3.0*   Recent Labs  Lab 05/12/21 1437  LIPASE 23   No results for input(s): AMMONIA in the last 168 hours. Diabetic: No results for input(s): HGBA1C in the last 72 hours. No results for input(s): GLUCAP in the last 168 hours. Cardiac Enzymes: Recent Labs  Lab 05/14/21 0533  CKTOTAL 34*   No results for input(s): PROBNP in the last 8760 hours. Coagulation Profile: No results for input(s): INR, PROTIME in the last 168 hours. Thyroid Function Tests: No results for input(s): TSH, T4TOTAL, FREET4, T3FREE, THYROIDAB in the last 72 hours. Lipid Profile: No  results for input(s): CHOL, HDL, LDLCALC, TRIG, CHOLHDL, LDLDIRECT in the last 72 hours. Anemia Panel: No results for input(s): VITAMINB12, FOLATE, FERRITIN, TIBC, IRON, RETICCTPCT in the last 72 hours. Urine analysis:    Component Value Date/Time   COLORURINE YELLOW 05/13/2021 0242   APPEARANCEUR CLOUDY (A) 05/13/2021 0242   LABSPEC 1.020 05/13/2021 0242   PHURINE 5.5 05/13/2021 0242   GLUCOSEU NEGATIVE 05/13/2021 0242   HGBUR LARGE (A) 05/13/2021 0242   BILIRUBINUR SMALL (A) 05/13/2021 0242   BILIRUBINUR negative 10/09/2020 1307   KETONESUR TRACE (A) 05/13/2021 0242   PROTEINUR 100 (A) 05/13/2021 0242   UROBILINOGEN 0.2 10/09/2020 1307   UROBILINOGEN 0.2 04/15/2009 0859   NITRITE NEGATIVE 05/13/2021 0242   LEUKOCYTESUR TRACE (A) 05/13/2021 0242   Sepsis Labs: Invalid input(s): PROCALCITONIN, [place]  Microbiology: Recent Results (from the past 240 hour(s))  Resp Panel by RT-PCR (Flu A&B, Covid) Nasopharyngeal Swab     Status: None   Collection Time: 05/13/21  2:42 AM   Specimen: Nasopharyngeal Swab; Nasopharyngeal(NP) swabs in vial transport medium  Result Value Ref Range Status   SARS Coronavirus 2 by RT PCR NEGATIVE NEGATIVE Final    Comment: (NOTE) SARS-CoV-2 target nucleic acids are NOT DETECTED.  The SARS-CoV-2 RNA is generally detectable in upper respiratory specimens during the acute phase of infection. The lowest concentration of SARS-CoV-2 viral copies this assay can detect is 138 copies/mL. A negative result does not preclude SARS-Cov-2 infection and should not be used as the sole basis for treatment or other patient management decisions. A negative result may occur with  improper specimen collection/handling, submission of specimen other than nasopharyngeal swab, presence of viral mutation(s) within the areas targeted by this assay, and inadequate number of viral copies(<138 copies/mL). A negative result must be combined with clinical observations, patient  history, and epidemiological information. The expected result is Negative.  Fact Sheet for Patients:  EntrepreneurPulse.com.au  Fact  Sheet for Healthcare Providers:  IncredibleEmployment.be  This test is no t yet approved or cleared by the [place] FDA and  has been authorized for detection and/or diagnosis of SARS-CoV-2 by FDA under an Emergency Use Authorization (EUA). This EUA will remain  in effect (meaning this test can be used) for the duration of the COVID-19 declaration under Section 564(b)(1) of the Act, 21 U.S.C.section 360bbb-3(b)(1), unless the authorization is terminated  or revoked sooner.       Influenza A by PCR NEGATIVE NEGATIVE Final   Influenza B by PCR NEGATIVE NEGATIVE Final    Comment: (NOTE) The Xpert Xpress SARS-CoV-2/FLU/RSV plus assay is intended as an aid in the diagnosis of influenza from Nasopharyngeal swab specimens and should not be used as a sole basis for treatment. Nasal washings and aspirates are unacceptable for Xpert Xpress SARS-CoV-2/FLU/RSV testing.  Fact Sheet for Patients: EntrepreneurPulse.com.au  Fact Sheet for Healthcare Providers: IncredibleEmployment.be  This test is not yet approved or cleared by the [place] FDA and has been authorized for detection and/or diagnosis of SARS-CoV-2 by FDA under an Emergency Use Authorization (EUA). This EUA will remain in effect (meaning this test can be used) for the duration of the COVID-19 declaration under Section 564(b)(1) of the Act, 21 U.S.C. section 360bbb-3(b)(1), unless the authorization is terminated or revoked.  Performed at [facility], [address] [place], [place] [address]   Urine Culture     Status: Abnormal   Collection Time: 05/13/21  2:42 AM   Specimen: Urine, Clean Catch  Result Value Ref Range Status   Specimen Description   Final    URINE, CLEAN CATCH Performed at [facility], [address] [place], [place] [address]    Special Requests   Final    NONE Performed at [facility], [address] [place], [place] [address]    Culture (A)  Final    >=100,000 COLONIES/mL AEROCOCCUS URINAE Standardized susceptibility testing for this organism is not available. Performed at [facility], [place] [address] [place], [place] [address]    Report Status 05/15/2021 FINAL  Final    Radiology Studies: No results found.   Nelson Julson T. [place]  If 7PM-7AM, please contact night-coverage [email or web address] 05/15/2021, 2:15 PM

## 2021-05-16 DIAGNOSIS — K219 Gastro-esophageal reflux disease without esophagitis: Secondary | ICD-10-CM

## 2021-05-16 DIAGNOSIS — E876 Hypokalemia: Secondary | ICD-10-CM

## 2021-05-16 DIAGNOSIS — I5021 Acute systolic (congestive) heart failure: Secondary | ICD-10-CM | POA: Diagnosis not present

## 2021-05-16 DIAGNOSIS — N3000 Acute cystitis without hematuria: Secondary | ICD-10-CM

## 2021-05-16 DIAGNOSIS — I25118 Atherosclerotic heart disease of native coronary artery with other forms of angina pectoris: Secondary | ICD-10-CM | POA: Diagnosis not present

## 2021-05-16 DIAGNOSIS — R5381 Other malaise: Secondary | ICD-10-CM

## 2021-05-16 DIAGNOSIS — Z66 Do not resuscitate: Secondary | ICD-10-CM | POA: Diagnosis not present

## 2021-05-16 LAB — IRON AND TIBC
Iron: 20 ug/dL — ABNORMAL LOW (ref 28–170)
Saturation Ratios: 6 % — ABNORMAL LOW (ref 10.4–31.8)
TIBC: 319 ug/dL (ref 250–450)
UIBC: 299 ug/dL

## 2021-05-16 LAB — RENAL FUNCTION PANEL
Albumin: 3 g/dL — ABNORMAL LOW (ref 3.5–5.0)
Anion gap: 6 (ref 5–15)
BUN: 16 mg/dL (ref 8–23)
CO2: 23 mmol/L (ref 22–32)
Calcium: 8.4 mg/dL — ABNORMAL LOW (ref 8.9–10.3)
Chloride: 106 mmol/L (ref 98–111)
Creatinine, Ser: 0.74 mg/dL (ref 0.44–1.00)
GFR, Estimated: >60 mL/min (ref >60)
Glucose, Bld: 114 mg/dL — ABNORMAL HIGH (ref 70–99)
Phosphorus: 4.4 mg/dL (ref 2.5–4.6)
Potassium: 4.6 mmol/L (ref 3.5–5.1)
Sodium: 135 mmol/L (ref 135–145)

## 2021-05-16 LAB — FOLATE: Folate: 9.6 ng/mL (ref >5.9)

## 2021-05-16 LAB — RETICULOCYTES
Immature Retic Fract: 13.9 % (ref 2.3–15.9)
RBC.: 3.67 MIL/uL — ABNORMAL LOW (ref 3.87–5.11)
Retic Count, Absolute: 79.3 10*3/uL (ref 19.0–186.0)
Retic Ct Pct: 2.2 % (ref 0.4–3.1)

## 2021-05-16 LAB — CBC
HCT: 33.1 % — ABNORMAL LOW (ref 36.0–46.0)
Hemoglobin: 10.8 g/dL — ABNORMAL LOW (ref 12.0–15.0)
MCH: 29.2 pg (ref 26.0–34.0)
MCHC: 32.6 g/dL (ref 30.0–36.0)
MCV: 89.5 fL (ref 80.0–100.0)
Platelets: 178 10*3/uL (ref 150–400)
RBC: 3.7 MIL/uL — ABNORMAL LOW (ref 3.87–5.11)
RDW: 15.5 % (ref 11.5–15.5)
WBC: 9.2 10*3/uL (ref 4.0–10.5)
nRBC: 0 % (ref 0.0–0.2)

## 2021-05-16 LAB — MAGNESIUM: Magnesium: 2.3 mg/dL (ref 1.7–2.4)

## 2021-05-16 LAB — VITAMIN B12: Vitamin B-12: 475 pg/mL (ref 180–914)

## 2021-05-16 LAB — FERRITIN: Ferritin: 42 ng/mL (ref 11–307)

## 2021-05-16 MED ORDER — CETIRIZINE HCL 10 MG PO TABS: 10.0000 mg | ORAL_TABLET | Freq: Every day | ORAL | Status: AC

## 2021-05-16 MED ORDER — METOLAZONE 2.5 MG PO TABS: 2.5000 mg | ORAL_TABLET | ORAL | 0 refills | Status: DC | PRN

## 2021-05-16 MED ORDER — SENNOSIDES-DOCUSATE SODIUM 8.6-50 MG PO TABS: 1.0000 | ORAL_TABLET | Freq: Two times a day (BID) | ORAL | Status: AC | PRN

## 2021-05-16 MED ORDER — POLYETHYLENE GLYCOL 3350 17 GM/SCOOP PO POWD: 17.0000 g | Freq: Two times a day (BID) | ORAL | 0 refills | Status: DC | PRN

## 2021-05-16 MED ORDER — SODIUM CHLORIDE 0.9 % IV SOLN
250.0000 mg | Freq: Once | INTRAVENOUS | Status: AC
  Administered 2021-05-16: 250 mg via INTRAVENOUS
  Filled 2021-05-16: qty 20

## 2021-05-16 NOTE — Progress Notes (Signed)
Teresa Moyer to be D/C'd Home per MD order.  Discussed prescriptions and follow up appointments with the patient. Prescriptions given to patient, medication list explained in detail. Pt verbalized understanding.  Allergies as of 05/16/2021       Reactions   Accupril [quinapril Hcl] Hives, Swelling   SWELLING OF NOSE/FACE   Sulfa Antibiotics Shortness Of Breath, Swelling   Clinoril [sulindac] Other (See Comments)   Unsure of reaction type   Hydrochlorothiazide Other (See Comments)   Unsure of reaction type   Spironolactone Diarrhea   Tessalon [benzonatate] Other (See Comments)   Unsure of reaction type   Amoxicillin Rash   Has patient had a PCN reaction causing immediate rash, facial/tongue/throat swelling, SOB or lightheadedness with hypotension: Unknown Has patient had a PCN reaction causing severe rash involving mucus membranes or skin necrosis: Unknown Has patient had a PCN reaction that required hospitalization: No Has patient had a PCN reaction occurring within the last 10 years:Possibly unsure  If all of the above answers are "NO", then may proceed with Cephalosporin use.   Codeine Sulfate Nausea And Vomiting   Penicillins Rash   Has patient had a PCN reaction causing immediate rash, facial/tongue/throat swelling, SOB or lightheadedness with hypotension: Unknown Has patient had a PCN reaction causing severe rash involving mucus membranes or skin necrosis: Unknown Has patient had a PCN reaction that required hospitalization: No Has patient had a PCN reaction occurring within the last 10 years:Possibly unsure  If all of the above answers are "NO", then may proceed with Cephalosporin use.        Medication List     TAKE these medications    acetaminophen 325 MG tablet Commonly known as: TYLENOL Take 650 mg by mouth every 6 (six) hours as needed.   Align 4 MG Caps Take 8 mg by mouth daily after lunch.   atorvastatin 40 MG tablet Commonly known as: LIPITOR Take 1  tablet (40 mg total) by mouth daily at 6 PM.   azelastine 0.1 % nasal spray Commonly known as: ASTELIN Place 1 spray into both nostrils 2 (two) times daily.   carvedilol 12.5 MG tablet Commonly known as: COREG Take 12.5 mg by mouth 2 (two) times daily with a meal.   cetirizine 10 MG tablet Commonly known as: ZYRTEC Take 1 tablet (10 mg total) by mouth at bedtime.   fluticasone 50 MCG/ACT nasal spray Commonly known as: FLONASE Place 1 spray into both nostrils daily.   furosemide 40 MG tablet Commonly known as: LASIX TAKE 1 TABLET BY MOUTH TWICE DAILY   GenTeal 0.25-0.3 % Gel Generic drug: Carboxymethylcell-Hypromellose Apply 1 application to eye at bedtime.   hydrOXYzine 25 MG capsule Commonly known as: VISTARIL Take 1 capsule (25 mg total) by mouth 3 (three) times daily as needed.   metolazone 2.5 MG tablet Commonly known as: ZAROXOLYN Take 1 tablet (2.5 mg total) by mouth every other day as needed (Increased swelling, shortness of breath or over 2 pound weight gain). What changed:  when to take this reasons to take this additional instructions   omeprazole 20 MG capsule Commonly known as: PRILOSEC TAKE 1 CAPSULE BY MOUTH ONCE DAILY   polyethylene glycol powder 17 GM/SCOOP powder Commonly known as: MiraLax Take 17 g by mouth 2 (two) times daily as needed for moderate constipation.   potassium chloride 10 MEQ tablet Commonly known as: KLOR-CON TAKE 1 TABLET BY MOUTH TWICE DAILY What changed: how much to take   PRESERVISION AREDS PO Take  1 tablet by mouth 2 (two) times daily.   senna-docusate 8.6-50 MG tablet Commonly known as: Senokot-S Take 1 tablet by mouth 2 (two) times daily as needed for moderate constipation.   traMADol 50 MG tablet Commonly known as: ULTRAM TAKE 1/2 TABLET BY MOUTH EVERY 4 TO 6 HOURS AS NEEDED  FOR MODERATE PAIN        Vitals:   05/16/21 0739 05/16/21 1550  BP: (!) 158/82 130/79  Pulse: 72 77  Resp: 18 20  Temp: 98 F (36.7  C) 99.1 F (37.3 C)  SpO2: 95% 91%    Skin clean, dry and intact without evidence of skin break down, no evidence of skin tears noted. IV catheter discontinued intact. Site without signs and symptoms of complications. Dressing and pressure applied. Pt denies pain at this time. No complaints noted.  An After Visit Summary was printed and given to the patient. Patient escorted via [place], and D/C home via private auto.  Teresa Moyer

## 2021-05-16 NOTE — Progress Notes (Signed)
Patient refused to participate with O2 saturation test. At rest patient was at 93% on room air. Patient stood at the side of bed and stayed at 93%. Patient refused to move anymore and when asked why she could not take any more steps patient refused to answer. Patient was complaining of some leg pain before getting up but refused pain medication. After 45 minutes of the patient refusing to answer any question asked by the nurse and refusing to take any steps, patient was picked up and put back in bed. Patient then said all she wanted to do was take a nap and be left alone. Patient is alert and oriented and was able to respond appropriately to any question asked that she wanted to answer.

## 2021-05-16 NOTE — Discharge Summary (Signed)
Physician Discharge Summary  Teresa Moyer JME:[id number] DOB: 03-11-1936 DOA: 05/13/2021  PCP: Jerrol Banana., MD  Admit date: 05/13/2021 Discharge date: 05/16/2021 Admitted From: Home Disposition: Home Recommendations for Outpatient Follow-up:  Follow ups as below. Please obtain CBC/BMP/Mag at follow up Please follow up on the following pending results: None Home Health: PT/RN Equipment/Devices: None Discharge Condition: Stable CODE STATUS: DNR/DNI  Follow-up Information     [facility] Follow up.   Specialty: Home Health Services Why: They will follow up with you for your home health needs.        Jerrol Banana., MD. Schedule an appointment as soon as possible for a visit in 1 week(s).   Specialty: Family Medicine Contact information: [address] [place] [address] [phone]                Hospital Course: 85 year old F with PMH of systolic CHF, CAD, GERD, urinary incontinence, OA and HTN presenting with nausea, vomiting, abdominal cramp, DOE, edema and dysuria, and admitted for acute hypoxic respiratory failure due to acute on chronic systolic CHF and possible UTI.  Reportedly desaturated to 87% on RA requiring supplemental oxygen.  BNP > 4500.  CXR and CTA chest/abdomen/pelvis with small bilateral pleural effusion.  UA with trace LE and large Hgb but no RBCs.  Started on IV Lasix and ceftriaxone.  Urine culture with Aerococcus urinae.  Completed antibiotic course with IV ceftriaxone for 3 days.  Patient diuresed well with IV Lasix with improvement in his symptoms.  She is discharged on home p.o. Lasix 40 mg twice daily with metolazone as needed.  She is on p.o. KCl as well.  Patient was liberated off oxygen but refused ambulatory saturation assessment despite understanding the importance of this.   See individual problem list below for more on hospital course.  Discharge Diagnoses:  Acute hypoxic  respiratory failure likely due to acute on chronic systolic CHF: Patient admits to dietary indiscretion during Thanksgiving holiday.  BNP> 4500.  CXR and CT chest consistent with CHF.  TTE in 2020 with LVEF of 45 to 50%, mild concentric LVH, impaired relaxation, symptoms improved with diuretics.  On Lasix and metolazone at home but does not seem to be using metolazone.  Diuresed with IV Lasix.  Net -4 L.  Hyponatremia resolved.  Creatinine improved. -Discharged on home p.o. Lasix 40 mg twice daily -Metolazone every other day as needed edema, increased shortness of breath or weight gain -Continue home p.o. KCl -Counseled on sodium and fluid restriction -Outpatient follow-up with cardiology   Acute cystitis: she reports dysuria.  UA with trace LE and large Hgb but no significant RBC.  Urine culture with Aerococcus urinae -Completed antibiotic course with IV ceftriaxone for 3 days per ID   History of CAD: No anginal symptoms. -Continue home meds   Hypokalemia/hypomagnesemia: Resolved.    Iron deficiency anemia: H&H at baseline.  Anemia panel suggest iron deficiency.  Received IV ferric gluconate 250 mg x 1 Recent Labs    09/01/20 0602 09/02/20 0301 09/03/20 0404 09/04/20 0422 09/16/20 1049 03/04/21 1049 05/12/21 1437 05/14/21 0533 05/15/21 0506 05/16/21 0458  HGB 12.2 12.0 11.1* 10.7* 10.8* 12.5 13.4 11.1* 10.5* 10.8*  -Recheck CBC at follow-up.   GERD: -Continue PPI.   Chronic back pain/osteoarthritis -Continue home meds.   Constipation. -Bowel regimen.   Goal of care counseling: DNR/DNI     Discharge Exam: Vitals:   05/16/21 0336 05/16/21 0500 05/16/21 0739 05/16/21  1550  BP: 132/60  (!) 158/82 130/79  Pulse: 74  72 77  Temp: 99.6 F (37.6 C)  98 F (36.7 C) 99.1 F (37.3 C)  Resp: 16  18 20   Height:      Weight:  58.2 kg    SpO2: 91%  95% 91%  TempSrc: Oral  Oral   BMI (Calculated):  23.46       GENERAL: Frail looking elderly female.  No apparent  distress. HEENT: MMM.  Vision and hearing grossly intact.  NECK: Supple.  No apparent JVD.  RESP: 93% on RA.  No IWOB.  Fair aeration bilaterally. CVS:  RRR. Heart sounds normal.  ABD/GI/GU: Bowel sounds present. Soft. Non tender.  MSK/EXT:  Moves extremities. No apparent deformity.  Trace edema. SKIN: no apparent skin lesion or wound NEURO: Awake and alert.  Oriented appropriately.  No apparent focal neuro deficit. PSYCH: Calm. Normal affect.   Discharge Instructions  Discharge Instructions     (HEART FAILURE PATIENTS) Call MD:  Anytime you have any of the following symptoms: 1) 3 pound weight gain in 24 hours or 5 pounds in 1 week 2) shortness of breath, with or without a dry hacking cough 3) swelling in the hands, feet or stomach 4) if you have to sleep on extra pillows at night in order to breathe.   Complete by: As directed    Call MD for:  difficulty breathing, headache or visual disturbances   Complete by: As directed    Call MD for:  extreme fatigue   Complete by: As directed    Call MD for:  persistant dizziness or light-headedness   Complete by: As directed    Diet - low sodium heart healthy   Complete by: As directed    Discharge instructions   Complete by: As directed    It has been a pleasure taking care of you!  You were hospitalized with heart failure exacerbation and urinary tract infection.  You have been treated with intravenous Lasix for heart failure exacerbation, and your symptoms and the swelling improved.  You completed antibiotic course for urinary tract infection.  Continue taking your furosemide (Lasix).  We made some adjustment to your metolazone.  Please review your new medication list and the directions on your medications before you take them.  Follow-up with your primary care doctor and cardiologist in 1 to 2 weeks or sooner if needed.  In addition to taking your medications as prescribed, we also recommend you avoid alcohol or over-the-counter pain  medication other than plain Tylenol, limit the amount of water/fluid you drink to less than 6 cups (1500 cc) a day,  limit your sodium (salt) intake to less than 2 g (2000 mg) a day and weigh yourself daily at the same time and keeping your weight log.     Take care,   Increase activity slowly   Complete by: As directed       Allergies as of 05/16/2021       Reactions   Accupril [quinapril Hcl] Hives, Swelling   SWELLING OF NOSE/FACE   Sulfa Antibiotics Shortness Of Breath, Swelling   Clinoril [sulindac] Other (See Comments)   Unsure of reaction type   Hydrochlorothiazide Other (See Comments)   Unsure of reaction type   Spironolactone Diarrhea   Tessalon [benzonatate] Other (See Comments)   Unsure of reaction type   Amoxicillin Rash   Has patient had a PCN reaction causing immediate rash, facial/tongue/throat swelling, SOB or lightheadedness with hypotension:  Unknown Has patient had a PCN reaction causing severe rash involving mucus membranes or skin necrosis: Unknown Has patient had a PCN reaction that required hospitalization: No Has patient had a PCN reaction occurring within the last 10 years:Possibly unsure  If all of the above answers are "NO", then may proceed with Cephalosporin use.   Codeine Sulfate Nausea And Vomiting   Penicillins Rash   Has patient had a PCN reaction causing immediate rash, facial/tongue/throat swelling, SOB or lightheadedness with hypotension: Unknown Has patient had a PCN reaction causing severe rash involving mucus membranes or skin necrosis: Unknown Has patient had a PCN reaction that required hospitalization: No Has patient had a PCN reaction occurring within the last 10 years:Possibly unsure  If all of the above answers are "NO", then may proceed with Cephalosporin use.        Medication List     TAKE these medications    acetaminophen 325 MG tablet Commonly known as: TYLENOL Take 650 mg by mouth every 6 (six) hours as needed.   Align  4 MG Caps Take 8 mg by mouth daily after lunch.   atorvastatin 40 MG tablet Commonly known as: LIPITOR Take 1 tablet (40 mg total) by mouth daily at 6 PM.   azelastine 0.1 % nasal spray Commonly known as: ASTELIN Place 1 spray into both nostrils 2 (two) times daily.   carvedilol 12.5 MG tablet Commonly known as: COREG Take 12.5 mg by mouth 2 (two) times daily with a meal.   cetirizine 10 MG tablet Commonly known as: ZYRTEC Take 1 tablet (10 mg total) by mouth at bedtime.   fluticasone 50 MCG/ACT nasal spray Commonly known as: FLONASE Place 1 spray into both nostrils daily.   furosemide 40 MG tablet Commonly known as: LASIX TAKE 1 TABLET BY MOUTH TWICE DAILY   GenTeal 0.25-0.3 % Gel Generic drug: Carboxymethylcell-Hypromellose Apply 1 application to eye at bedtime.   hydrOXYzine 25 MG capsule Commonly known as: VISTARIL Take 1 capsule (25 mg total) by mouth 3 (three) times daily as needed.   metolazone 2.5 MG tablet Commonly known as: ZAROXOLYN Take 1 tablet (2.5 mg total) by mouth every other day as needed (Increased swelling, shortness of breath or over 2 pound weight gain). What changed:  when to take this reasons to take this additional instructions   omeprazole 20 MG capsule Commonly known as: PRILOSEC TAKE 1 CAPSULE BY MOUTH ONCE DAILY   polyethylene glycol powder 17 GM/SCOOP powder Commonly known as: MiraLax Take 17 g by mouth 2 (two) times daily as needed for moderate constipation.   potassium chloride 10 MEQ tablet Commonly known as: KLOR-CON TAKE 1 TABLET BY MOUTH TWICE DAILY What changed: how much to take   PRESERVISION AREDS PO Take 1 tablet by mouth 2 (two) times daily.   senna-docusate 8.6-50 MG tablet Commonly known as: Senokot-S Take 1 tablet by mouth 2 (two) times daily as needed for moderate constipation.   traMADol 50 MG tablet Commonly known as: ULTRAM TAKE 1/2 TABLET BY MOUTH EVERY 4 TO 6 HOURS AS NEEDED  FOR MODERATE PAIN         Consultations: None  Procedures/Studies:   CT Abdomen Pelvis Wo Contrast  Result Date: 05/12/2021 CLINICAL DATA:  Nausea and vomiting EXAM: CT ABDOMEN AND PELVIS WITHOUT CONTRAST TECHNIQUE: Multidetector CT imaging of the abdomen and pelvis was performed following the standard protocol without IV contrast. COMPARISON:  08/31/2020 FINDINGS: LOWER CHEST: Small pleural effusions.  Mild cardiomegaly. HEPATOBILIARY: Unchanged appendix cyst  measuring 3.6 cm. Right-sided predominant intrahepatic biliary dilatation is unchanged. Status post cholecystectomy. PANCREAS: Normal pancreas. No ductal dilatation or peripancreatic fluid collection. SPLEEN: Normal. ADRENALS/URINARY TRACT: The adrenal glands are normal. No hydronephrosis, nephroureterolithiasis or solid renal mass. The urinary bladder is normal for degree of distention STOMACH/BOWEL: There is no hiatal hernia. Normal duodenal course and caliber. No small bowel dilatation or inflammation. No focal colonic abnormality. The appendix is not visualized. No right lower quadrant inflammation or free fluid. VASCULAR/LYMPHATIC: There is calcific atherosclerosis of the abdominal aorta. No lymphadenopathy. REPRODUCTIVE: Status post hysterectomy. No adnexal mass. MUSCULOSKELETAL. No bony spinal canal stenosis or focal osseous abnormality. OTHER: None. IMPRESSION: 1. No acute abnormality of the abdomen or pelvis. 2. Small pleural effusions and mild cardiomegaly. Aortic Atherosclerosis (ICD10-I70.0). Electronically Signed   By: Ulyses Jarred M.D.   On: 05/12/2021 23:59   DG Chest 2 View  Result Date: 05/12/2021 CLINICAL DATA:  Shortness of breath, nausea, abdominal pain, vomiting. EXAM: CHEST - 2 VIEW COMPARISON:  08/31/2020 and CT chest 09/01/2020. FINDINGS: Trachea is midline. Heart is enlarged, stable. Fluid or atelectasis along the minor fissure. Left perihilar subsegmental atelectasis. Small bilateral pleural effusions. No airspace consolidation.  Midthoracic compression fracture is unchanged. IMPRESSION: Small bilateral pleural effusions. Electronically Signed   By: Lorin Picket M.D.   On: 05/12/2021 15:18   CT Angio Chest PE W/Cm &/Or Wo Cm  Result Date: 05/13/2021 CLINICAL DATA:  Shortness of breath. EXAM: CT ANGIOGRAPHY CHEST WITH CONTRAST TECHNIQUE: Multidetector CT imaging of the chest was performed using the standard protocol during bolus administration of intravenous contrast. Multiplanar CT image reconstructions and MIPs were obtained to evaluate the vascular anatomy. CONTRAST:  7mL OMNIPAQUE IOHEXOL 350 MG/ML SOLN COMPARISON:  September 01, 2020 FINDINGS: Cardiovascular: There is mild calcification of the aortic arch, without evidence of aortic aneurysm satisfactory opacification of the pulmonary arteries to the segmental level. No evidence of pulmonary embolism. There is mild to moderate severity cardiomegaly. No pericardial effusion. Mediastinum/Nodes: Moderate severity AP window and pretracheal lymphadenopathy is seen. The largest lymph node measures approximately 1.2 cm. Thyroid gland, trachea, and esophagus demonstrate no significant findings. Lungs/Pleura: Mild linear atelectasis is seen within the bilateral upper lobes and right middle lobe. Small bilateral pleural effusions are seen. No pneumothorax is identified. Upper Abdomen: A stable 3.7 cm x 3.2 cm cyst is seen within the right lobe of the liver. Musculoskeletal: A chronic compression fracture deformity is seen at the level of T8. Marked severity multilevel degenerative changes are noted throughout the thoracic spine. Review of the MIP images confirms the above findings. IMPRESSION: 1. No evidence of pulmonary embolism. 2. Small bilateral pleural effusions. 3. Mild bilateral upper lobe and right middle lobe linear atelectasis. 4. Stable hepatic cyst. 5. Chronic compression fracture deformity of the T8 vertebral body. 6. Aortic atherosclerosis. Aortic Atherosclerosis (ICD10-I70.0).  Electronically Signed   By: Virgina Norfolk M.D.   On: 05/13/2021 03:33       The results of significant diagnostics from this hospitalization (including imaging, microbiology, ancillary and laboratory) are listed below for reference.     Microbiology: Recent Results (from the past 240 hour(s))  Resp Panel by RT-PCR (Flu A&B, Covid) Nasopharyngeal Swab     Status: None   Collection Time: 05/13/21  2:42 AM   Specimen: Nasopharyngeal Swab; Nasopharyngeal(NP) swabs in vial transport medium  Result Value Ref Range Status   SARS Coronavirus 2 by RT PCR NEGATIVE NEGATIVE Final    Comment: (NOTE) SARS-CoV-2 target nucleic acids  are NOT DETECTED.  The SARS-CoV-2 RNA is generally detectable in upper respiratory specimens during the acute phase of infection. The lowest concentration of SARS-CoV-2 viral copies this assay can detect is 138 copies/mL. A negative result does not preclude SARS-Cov-2 infection and should not be used as the sole basis for treatment or other patient management decisions. A negative result may occur with  improper specimen collection/handling, submission of specimen other than nasopharyngeal swab, presence of viral mutation(s) within the areas targeted by this assay, and inadequate number of viral copies(<138 copies/mL). A negative result must be combined with clinical observations, patient history, and epidemiological information. The expected result is Negative.  Fact Sheet for Patients:  EntrepreneurPulse.com.au  Fact Sheet for Healthcare Providers:  IncredibleEmployment.be  This test is no t yet approved or cleared by the [place] FDA and  has been authorized for detection and/or diagnosis of SARS-CoV-2 by FDA under an Emergency Use Authorization (EUA). This EUA will remain  in effect (meaning this test can be used) for the duration of the COVID-19 declaration under Section 564(b)(1) of the Act, 21 U.S.C.section  360bbb-3(b)(1), unless the authorization is terminated  or revoked sooner.       Influenza A by PCR NEGATIVE NEGATIVE Final   Influenza B by PCR NEGATIVE NEGATIVE Final    Comment: (NOTE) The Xpert Xpress SARS-CoV-2/FLU/RSV plus assay is intended as an aid in the diagnosis of influenza from Nasopharyngeal swab specimens and should not be used as a sole basis for treatment. Nasal washings and aspirates are unacceptable for Xpert Xpress SARS-CoV-2/FLU/RSV testing.  Fact Sheet for Patients: EntrepreneurPulse.com.au  Fact Sheet for Healthcare Providers: IncredibleEmployment.be  This test is not yet approved or cleared by the [place] FDA and has been authorized for detection and/or diagnosis of SARS-CoV-2 by FDA under an Emergency Use Authorization (EUA). This EUA will remain in effect (meaning this test can be used) for the duration of the COVID-19 declaration under Section 564(b)(1) of the Act, 21 U.S.C. section 360bbb-3(b)(1), unless the authorization is terminated or revoked.  Performed at [facility], [address] [place], [place] [address]   Urine Culture     Status: Abnormal   Collection Time: 05/13/21  2:42 AM   Specimen: Urine, Clean Catch  Result Value Ref Range Status   Specimen Description   Final    URINE, CLEAN CATCH Performed at [facility], [address] [place], [place] [address]    Special Requests   Final    NONE Performed at [facility], [address] [place], [place] [address]    Culture (A)  Final    >=100,000 COLONIES/mL AEROCOCCUS URINAE Standardized susceptibility testing for this organism is not available. Performed at [facility], [place] [address] [place],  [address]    Report Status 05/15/2021 FINAL  Final     Labs:  CBC: Recent Labs  Lab 05/12/21 1437 05/14/21 0533 05/15/21 0506 05/16/21 0458  WBC 10.1 8.4 8.6 9.2  HGB 13.4 11.1* 10.5*  10.8*  HCT 40.0 32.4* 31.8* 33.1*  MCV 87.7 89.3 89.6 89.5  PLT 201 153 155 178   BMP &GFR Recent Labs  Lab 05/12/21 1437 05/13/21 0242 05/14/21 0533 05/15/21 0506 05/16/21 0458  NA 135  --  133* 134* 135  K 2.9*  --  4.1 3.6 4.6  CL 103  --  108 106 106  CO2 22  --  21* 23 23  GLUCOSE 122*  --  110* 107* 114*  BUN 11  --  14 14 16   CREATININE 0.68  --  0.69 0.68 0.74  CALCIUM 9.1  --  8.3* 8.2* 8.4*  MG  --  1.8  --  1.7 2.3  PHOS  --   --   --  4.3 4.4   Estimated Creatinine Clearance: 42.1 mL/min (by C-G formula based on SCr of 0.74 mg/dL). Liver & Pancreas: Recent Labs  Lab 05/12/21 1437 05/15/21 0506 05/16/21 0458  AST 23  --   --   ALT 13  --   --   ALKPHOS 95  --   --   BILITOT 4.3*  --   --   PROT 6.8  --   --   ALBUMIN 3.8 3.0* 3.0*   Recent Labs  Lab 05/12/21 1437  LIPASE 23   No results for input(s): AMMONIA in the last 168 hours. Diabetic: No results for input(s): HGBA1C in the last 72 hours. No results for input(s): GLUCAP in the last 168 hours. Cardiac Enzymes: Recent Labs  Lab 05/14/21 0533  CKTOTAL 34*   No results for input(s): PROBNP in the last 8760 hours. Coagulation Profile: No results for input(s): INR, PROTIME in the last 168 hours. Thyroid Function Tests: No results for input(s): TSH, T4TOTAL, FREET4, T3FREE, THYROIDAB in the last 72 hours. Lipid Profile: No results for input(s): CHOL, HDL, LDLCALC, TRIG, CHOLHDL, LDLDIRECT in the last 72 hours. Anemia Panel: Recent Labs    05/16/21 0458  VITAMINB12 475  FOLATE 9.6  FERRITIN 42  TIBC 319  IRON 20*  RETICCTPCT 2.2   Urine analysis:    Component Value Date/Time   COLORURINE YELLOW 05/13/2021 0242   APPEARANCEUR CLOUDY (A) 05/13/2021 0242   LABSPEC 1.020 05/13/2021 0242   PHURINE 5.5 05/13/2021 0242   GLUCOSEU NEGATIVE 05/13/2021 0242   HGBUR LARGE (A) 05/13/2021 0242   BILIRUBINUR SMALL (A) 05/13/2021 0242   BILIRUBINUR negative 10/09/2020 1307   KETONESUR TRACE  (A) 05/13/2021 0242   PROTEINUR 100 (A) 05/13/2021 0242   UROBILINOGEN 0.2 10/09/2020 1307   UROBILINOGEN 0.2 04/15/2009 0859   NITRITE NEGATIVE 05/13/2021 0242   LEUKOCYTESUR TRACE (A) 05/13/2021 0242   Sepsis Labs: Invalid input(s): PROCALCITONIN, LACTICIDVEN   Time coordinating discharge: 50 minutes  SIGNED:  Mercy Riding, MD  [facility] 05/16/2021, 5:16 PM

## 2021-05-16 NOTE — TOC Progression Note (Signed)
Transition of Care [facility]) - Progression Note    Patient Details  Name: Teresa Moyer MRN: [id number] Date of Birth: 1935-08-08  Transition of [facility]) CM/SW [place], LCSW Phone Number: 05/16/2021, 4:00 PM  Clinical Narrative:   Per RN, patient has been refusing walking sats test. [place] is aware of plan for discharge home today.  Expected Discharge Plan: [place] Barriers to Discharge: Continued Medical Work up  Expected Discharge Plan and Services Expected Discharge Plan: [place] arrangements for the past 2 months: Single Family Home Expected Discharge Date: 05/16/21                         HH Arranged: RN, PT [facility] Agency: [place] ([place]) Date [place]: 05/14/21   Representative spoke with at [place]: Floydene Flock   Social Determinants of Health (SDOH) Interventions    Readmission Risk Interventions No flowsheet data found.

## 2021-05-17 LAB — ECHOCARDIOGRAM COMPLETE
AR max vel: 0.52 cm2
AV Area VTI: 0.54 cm2
AV Area mean vel: 0.49 cm2
AV Mean grad: 15 mmHg
AV Peak grad: 22.4 mmHg
Ao pk vel: 2.37 m/s
Area-P 1/2: 4.68 cm2
S' Lateral: 3.97 cm

## 2021-05-19 ENCOUNTER — Telehealth: Payer: Self-pay

## 2021-05-19 NOTE — Telephone Encounter (Signed)
Transition Care Management Follow-up Telephone Call Date of discharge and from where: [facility] 05/16/21  How have you been since you were released from the hospital? [place] Any questions or concerns? No  Items Reviewed: Did the pt receive and understand the discharge instructions provided? Yes  Medications obtained and verified? Yes  Other? No  Any new allergies since your discharge? No  Dietary orders reviewed? No Do you have support at home? Yes   Home Care and Equipment/Supplies: Were home health services ordered? yes If so, what is the name of the agency? [facility] Has the agency set up a time to come to the patient's home? yes Were any new equipment or medical supplies ordered?  No Do you have any questions related to the use of the equipment or supplies? No  Functional Questionnaire: (I = Independent and D = Dependent) ADLs: I  Bathing/Dressing- D  Meal Prep- D  Eating- I  Maintaining continence- I  Transferring/Ambulation- D  Managing Meds- I  Follow up appointments reviewed:  PCP Hospital f/u appt confirmed? No   Specialist Hospital f/u appt confirmed? No  has eye appt next week at [facility] Are transportation arrangements needed? No  If their condition worsens, is the pt aware to call PCP or go to the Emergency Dept.? Yes Was the patient provided with contact information for the PCP's office or ED? Yes Was to pt encouraged to call back with questions or concerns? Yes

## 2021-05-20 ENCOUNTER — Telehealth: Payer: Self-pay | Admitting: Family Medicine

## 2021-05-20 NOTE — Telephone Encounter (Signed)
Please advise verbal orders? 

## 2021-05-20 NOTE — Telephone Encounter (Signed)
Copied from [place] [phone]. Topic: General - Other >> May 20, 2021  1:53 PM McGill, Nelva Bush wrote:  Home Health Verbal Orders - Caller/Agency: [place] [facility] [place] Number: [phone] ( Stated her vm is secure and can leave a message)  Requesting OT/PT/Skilled Nursing/Social Work/Speech Therapy: PT  Frequency: twice a week for 2 weeks and then once a week for 2 weeks

## 2021-05-21 ENCOUNTER — Telehealth: Payer: Self-pay | Admitting: Student

## 2021-05-21 NOTE — Telephone Encounter (Signed)
Left detailed message giving verbal orders.

## 2021-05-21 NOTE — Telephone Encounter (Signed)
Called and discussed echocardiogram finding with patient over the phone.  Echo showed reduced LVEF of 30 to 35% (slightly lower than previous), G2-DD and severe aortic stenosis (new).  Patient states "I feel fine".  I have advised her to call her cardiologist office for close follow-up about her echocardiogram.  I have already discussed with her cardiologist, Dr. Nehemiah Massed by secure chat on 04/17/2021.  Dr. Nehemiah Massed, will follow up with her closely.

## 2021-05-26 DIAGNOSIS — I10 Essential (primary) hypertension: Secondary | ICD-10-CM | POA: Diagnosis not present

## 2021-05-26 DIAGNOSIS — I5022 Chronic systolic (congestive) heart failure: Secondary | ICD-10-CM | POA: Diagnosis not present

## 2021-05-26 DIAGNOSIS — I2510 Atherosclerotic heart disease of native coronary artery without angina pectoris: Secondary | ICD-10-CM | POA: Diagnosis not present

## 2021-05-26 DIAGNOSIS — I5043 Acute on chronic combined systolic (congestive) and diastolic (congestive) heart failure: Secondary | ICD-10-CM | POA: Diagnosis not present

## 2021-05-29 ENCOUNTER — Telehealth: Payer: Self-pay

## 2021-05-29 DIAGNOSIS — H353231 Exudative age-related macular degeneration, bilateral, with active choroidal neovascularization: Secondary | ICD-10-CM | POA: Diagnosis not present

## 2021-05-29 NOTE — Telephone Encounter (Signed)
Copied from [place] [phone]. Topic: Quick Communication - Home Health Verbal Orders >> May 29, 2021  4:28 PM [name], Apolonio Schneiders wrote: Caller/Agency: [place] Number: [phone] option 2 Requesting: Skilled Nursing Frequency: 1x10

## 2021-05-30 NOTE — Telephone Encounter (Signed)
That's fine

## 2021-05-30 NOTE — Telephone Encounter (Signed)
Verbal orders given  

## 2021-06-02 ENCOUNTER — Encounter: Payer: Self-pay | Admitting: Physician Assistant

## 2021-06-02 ENCOUNTER — Inpatient Hospital Stay: Payer: Medicare Other | Admitting: Family Medicine

## 2021-06-02 ENCOUNTER — Other Ambulatory Visit: Payer: Self-pay

## 2021-06-02 ENCOUNTER — Ambulatory Visit (INDEPENDENT_AMBULATORY_CARE_PROVIDER_SITE_OTHER): Payer: Medicare Other | Admitting: Physician Assistant

## 2021-06-02 VITALS — BP 145/79 | HR 69 | Ht 62.5 in | Wt 127.2 lb

## 2021-06-02 DIAGNOSIS — D649 Anemia, unspecified: Secondary | ICD-10-CM | POA: Insufficient documentation

## 2021-06-02 DIAGNOSIS — I5022 Chronic systolic (congestive) heart failure: Secondary | ICD-10-CM | POA: Diagnosis not present

## 2021-06-02 DIAGNOSIS — I5043 Acute on chronic combined systolic (congestive) and diastolic (congestive) heart failure: Secondary | ICD-10-CM | POA: Diagnosis not present

## 2021-06-02 NOTE — Assessment & Plan Note (Addendum)
Continue current meds, continue to monitor BP at home Will check BMP post hospitalization  F/u 3 months as scheduled

## 2021-06-02 NOTE — Assessment & Plan Note (Signed)
Likely anemia of chronic disease, will f/u w/ cbc

## 2021-06-02 NOTE — Progress Notes (Signed)
Established patient visit   Patient: Teresa Moyer   DOB: [birth year]   85 y.o. Female  MRN: [id number] Visit Date: 06/02/2021  Today's healthcare provider: [name], PA-C   Cc. Hospital f/u, DNR form  Subjective    HPI  Esteen is an 85 y/o female who presents today for assistance with a DNR form.    Follow up Hospitalization  Patient was admitted to [facility] on 05/13/21 and discharged on 05/16/21. She was treated for Acute Systolic CHF Treatment for this included continue taking furosemide and adjustments made to [facility] health verbal orders. She reports good compliance with treatment. She reports this condition is improved. Denies any SOB, CP, fatigue, lower leg swelling.   Pt f/u with cardiology 05/26/2021 no changes w/ medication were made.   --------------------------------------------------------------------------------------- Medications: Outpatient Medications Prior to Visit  Medication Sig   acetaminophen (TYLENOL) 325 MG tablet Take 650 mg by mouth every 6 (six) hours as needed.   atorvastatin (LIPITOR) 40 MG tablet Take 1 tablet (40 mg total) by mouth daily at 6 PM.   azelastine (ASTELIN) 0.1 % nasal spray Place 1 spray into both nostrils 2 (two) times daily.   Carboxymethylcell-Hypromellose (GENTEAL) 0.25-0.3 % GEL Apply 1 application to eye at bedtime.    carvedilol (COREG) 12.5 MG tablet Take 12.5 mg by mouth 2 (two) times daily with a meal.    cetirizine (ZYRTEC) 10 MG tablet Take 1 tablet (10 mg total) by mouth at bedtime.   fluticasone (FLONASE) 50 MCG/ACT nasal spray Place 1 spray into both nostrils daily.    furosemide (LASIX) 40 MG tablet TAKE 1 TABLET BY MOUTH TWICE DAILY   hydrOXYzine (VISTARIL) 25 MG capsule Take 1 capsule (25 mg total) by mouth 3 (three) times daily as needed.   metolazone (ZAROXOLYN) 2.5 MG tablet Take 1 tablet (2.5 mg total) by mouth every other day as needed (Increased swelling, shortness of breath or over 2 pound  weight gain).   Multiple Vitamins-Minerals (PRESERVISION AREDS PO) Take 1 tablet by mouth 2 (two) times daily.    omeprazole (PRILOSEC) 20 MG capsule TAKE 1 CAPSULE BY MOUTH ONCE DAILY   polyethylene glycol powder (MIRALAX) 17 GM/SCOOP powder Take 17 g by mouth 2 (two) times daily as needed for moderate constipation.   potassium chloride (KLOR-CON) 10 MEQ tablet TAKE 1 TABLET BY MOUTH TWICE DAILY (Patient taking differently: Take 20 mEq by mouth 2 (two) times daily.)   Probiotic Product (ALIGN) 4 MG CAPS Take 8 mg by mouth daily after lunch.   senna-docusate (SENOKOT-S) 8.6-50 MG tablet Take 1 tablet by mouth 2 (two) times daily as needed for moderate constipation.   traMADol (ULTRAM) 50 MG tablet TAKE 1/2 TABLET BY MOUTH EVERY 4 TO 6 HOURS AS NEEDED  FOR MODERATE PAIN   No facility-administered medications prior to visit.    Review of Systems  Constitutional:  Negative for fatigue and fever.  Respiratory:  Negative for shortness of breath and wheezing.   Cardiovascular:  Negative for chest pain.  Gastrointestinal:  Negative for abdominal pain.  Neurological:  Negative for dizziness and headaches.      Objective    Blood pressure (!) 145/79, pulse 69, height 5' 2.5" (1.588 m), weight 127 lb 3.2 oz (57.7 kg), SpO2 98 %.   Physical Exam Constitutional:      General: She is awake.     Appearance: She is well-developed.  HENT:     Head: Normocephalic.  Eyes:  Conjunctiva/sclera: Conjunctivae normal.  Cardiovascular:     Rate and Rhythm: Normal rate and regular rhythm.     Heart sounds: Murmur heard.     Comments: Loud murmur heard RSB. Trace b/l peripheral edema Pulmonary:     Effort: Pulmonary effort is normal.     Breath sounds: Normal breath sounds.  Skin:    General: Skin is warm.  Neurological:     Mental Status: She is alert and oriented to person, place, and time.  Psychiatric:        Attention and Perception: Attention normal.        Mood and Affect: Mood normal.         Speech: Speech normal.        Behavior: Behavior is cooperative.     No results found for any visits on 06/02/21.  Assessment & Plan     DNR / Advanced Directive -Instructed pt on what she needs to fill out w/ son Discussed DNR form w/ pt she verbalized understanding on what a DNR indicates. Advised to put on fridge at home.  Problem List Items Addressed This Visit       Cardiovascular and Mediastinum   Chronic systolic CHF (congestive heart failure), NYHA class 2 (HCC) - Primary    Continue current meds, continue to monitor BP at home Will check BMP post hospitalization  F/u 3 months as scheduled      Relevant Orders   Basic Metabolic Panel (BMET)     Other   Anemia    Likely anemia of chronic disease, will f/u w/ cbc       Relevant Orders   CBC   Return as scheduled.     I, [name], PA-C have reviewed all documentation for this visit. The documentation on  06/02/2021 for the exam, diagnosis, procedures, and orders are all accurate and complete.    [name], PA-C  [facility] [phone] (phone) [phone] (fax)  [place]

## 2021-06-03 ENCOUNTER — Other Ambulatory Visit: Payer: Self-pay

## 2021-06-03 ENCOUNTER — Ambulatory Visit
Admission: RE | Admit: 2021-06-03 | Discharge: 2021-06-03 | Disposition: A | Discharge status: Home / Self Care | Payer: Medicare Other | Source: Ambulatory Visit | Attending: Family Medicine | Admitting: Family Medicine

## 2021-06-03 DIAGNOSIS — Z1231 Encounter for screening mammogram for malignant neoplasm of breast: Secondary | ICD-10-CM | POA: Diagnosis not present

## 2021-06-03 LAB — CBC
Hematocrit: 39.1 % (ref 34.0–46.6)
Hemoglobin: 13 g/dL (ref 11.1–15.9)
MCH: 29.6 pg (ref 26.6–33.0)
MCHC: 33.2 g/dL (ref 31.5–35.7)
MCV: 89 fL (ref 79–97)
Platelets: 279 10*3/uL (ref 150–450)
RBC: 4.39 x10E6/uL (ref 3.77–5.28)
RDW: 14.4 % (ref 11.7–15.4)
WBC: 9.2 10*3/uL (ref 3.4–10.8)

## 2021-06-03 LAB — BASIC METABOLIC PANEL
BUN/Creatinine Ratio: 26 (ref 12–28)
BUN: 20 mg/dL (ref 8–27)
CO2: 23 mmol/L (ref 20–29)
Calcium: 9.3 mg/dL (ref 8.7–10.3)
Chloride: 104 mmol/L (ref 96–106)
Creatinine, Ser: 0.77 mg/dL (ref 0.57–1.00)
Glucose: 90 mg/dL (ref 70–99)
Potassium: 4 mmol/L (ref 3.5–5.2)
Sodium: 142 mmol/L (ref 134–144)
eGFR: 76 mL/min/{1.73_m2} (ref >59)

## 2021-06-11 ENCOUNTER — Telehealth: Payer: Self-pay | Admitting: Family Medicine

## 2021-06-11 NOTE — Telephone Encounter (Signed)
Please advise verbal orders? 

## 2021-06-11 NOTE — Telephone Encounter (Signed)
Copied from [place] [id number]. Topic: Quick Communication - Home Health Verbal Orders >> Jun 11, 2021  3:08 PM Yvette Rack wrote: Caller/Agency: Leigh Aurora with [facility]  Callback Number: [phone] Requesting OT/PT/Skilled Nursing/Social Work/Speech Therapy: Malachy Mood requests order to collect UA with CNS

## 2021-06-12 NOTE — Telephone Encounter (Signed)
Teresa Moyer was given verbal okay.

## 2021-06-13 DIAGNOSIS — N390 Urinary tract infection, site not specified: Secondary | ICD-10-CM | POA: Diagnosis not present

## 2021-06-18 ENCOUNTER — Telehealth: Payer: Self-pay | Admitting: Family Medicine

## 2021-06-18 NOTE — Telephone Encounter (Signed)
That's fine

## 2021-06-18 NOTE — Telephone Encounter (Signed)
Copied from [place] [id number]. Topic: Quick Communication - Home Health Verbal Orders >> Jun 18, 2021  9:36 AM Leward Quan A wrote: Caller/Agency: Jonelle Sidle // [place] Number: [phone] opt 2 ok to LM Nursing Requesting OT/PT/Skilled Nursing/Social Work/Speech Therapy: Nursing  Frequency: New order for nursing start of care due to insurance change

## 2021-06-18 NOTE — Telephone Encounter (Signed)
Tiffany advised.   Thanks,   -Jakwan Sally  

## 2021-06-27 ENCOUNTER — Telehealth: Payer: Self-pay | Admitting: Family Medicine

## 2021-06-27 NOTE — Telephone Encounter (Signed)
That's fine

## 2021-06-27 NOTE — Telephone Encounter (Signed)
Home Health Verbal Orders - Caller/Agency: Malachy Mood / [facility]  Callback Number: [phone] vm can be left  Requesting Skilled Nursing Frequency: 1x a week for 7 weeks  Education on CHF and medications

## 2021-07-15 ENCOUNTER — Telehealth: Payer: Self-pay | Admitting: Family Medicine

## 2021-07-15 NOTE — Telephone Encounter (Signed)
Please advise 

## 2021-07-15 NOTE — Telephone Encounter (Signed)
Home Health Verbal Orders - Caller/Agency: [place] Number: [phone]  Requesting OT/PT/Skilled Nursing/Social Work/Speech Therapy: Nursing  Frequency: 1w7

## 2021-07-17 NOTE — Telephone Encounter (Signed)
Verbal ordered were given.

## 2021-08-04 ENCOUNTER — Other Ambulatory Visit: Payer: Self-pay | Admitting: Family Medicine

## 2021-08-04 DIAGNOSIS — K219 Gastro-esophageal reflux disease without esophagitis: Secondary | ICD-10-CM

## 2021-08-05 NOTE — Telephone Encounter (Signed)
Requested Prescriptions  Pending Prescriptions Disp Refills   omeprazole (PRILOSEC) 20 MG capsule [Pharmacy Med Name: OMEPRAZOLE DR 20 MG CAP] 90 capsule 2    Sig: TAKE 1 CAPSULE BY MOUTH ONCE DAILY     Gastroenterology: Proton Pump Inhibitors Passed - 08/04/2021  6:16 PM      Passed - Valid encounter within last 12 months    Recent Outpatient Visits          2 months ago Chronic systolic CHF (congestive heart failure), NYHA class 2 ([place])   [facility] [name], PA-C   4 months ago Acute on chronic combined systolic and diastolic CHF (congestive heart failure) [facility])   [facility] Jerrol Banana., MD   5 months ago Congestive heart failure, unspecified HF chronicity, unspecified heart failure type [facility])   [facility] Jerrol Banana., MD   8 months ago Acute on chronic combined systolic and diastolic heart failure [facility])   [facility] Jerrol Banana., MD   10 months ago Congestive heart failure, unspecified HF chronicity, unspecified heart failure type [facility])   [facility] Jerrol Banana., MD      Future Appointments            In 1 month Jerrol Banana., MD [facility] [place]

## 2021-08-25 ENCOUNTER — Telehealth: Payer: Self-pay

## 2021-08-25 NOTE — Telephone Encounter (Signed)
Copied from [place] [id number]. Topic: General - Other ?>> Aug 22, 2021  4:11 PM [name], Brayton Layman A wrote: ?Reason for CRM: Educational psychologist from [place] faxed over the pts labs results, please advise if they have been received.  [phone] ?

## 2021-08-25 NOTE — Telephone Encounter (Signed)
Have you seen this fax?

## 2021-09-05 NOTE — Progress Notes (Signed)
?  ? ? ?Established patient visit ? ? ?Patient: Teresa Moyer   DOB: [birth year]   86 y.o. Female  MRN: [id number] ?Visit Date: 09/08/2021 ? ?Today's healthcare provider: [name], MD  ? ?Chief Complaint  ?Patient presents with  ? Follow-up  ? Congestive Heart Failure  ? Hyperlipidemia  ? ?Subjective  ?  ?HPI  ?Patient comes in today for follow-up.  She is feeling fairly well.  She is drinking Producer, television/film/video which is a protein drink and is drinking 2 a day.  She has gained from 125 pounds in January to 133 pounds today at home. ?She seems to gain the weight with her protein drinks and not with fluid.  Breathing is fine.  No chest pain. ?Follow up for Chronic systolic CHF (congestive heart failure), NYHA class 2: ? ?The patient was last seen for this 3 months ago. ?Changes made at last visit include; seen by Sharen Counter. Continue current meds, continue to monitor BP at home. Will check BMP post hospitalization. ? ?She reports good compliance with treatment. ?She feels that condition is Unchanged. ?She is not having side effects. none ? ?----------------------------------------------------------------------------------------- ?Lipid/Cholesterol, Follow-up ? ?Last lipid panel Other pertinent labs  ?Lab Results  ?Component Value Date  ? CHOL 104 03/04/2021  ? HDL 73 03/04/2021  ? [place] 14 03/04/2021  ? TRIG 85 03/04/2021  ? CHOLHDL 1.4 03/04/2021  ? Lab Results  ?Component Value Date  ? ALT 13 05/12/2021  ? AST 23 05/12/2021  ? PLT 279 06/02/2021  ? TSH 2.570 09/01/2020  ?  ? ?She was last seen for this 6 months ago.  ?Management since that visit includes; labs checked showing-stable. ? ?She reports good compliance with treatment. ?She is not having side effects. none ? ?Current diet: well balanced ?Current exercise: none ? ?The ASCVD Risk score (Arnett DK, et al., 2019) failed to calculate for the following reasons: ?  The 2019 ASCVD risk score is only valid for ages 58 to 36 ?  The patient has a prior MI or  stroke diagnosis ? ?--------------------------------------------------------------------------------------------------- ? ? ?Medications: ?Outpatient Medications Prior to Visit  ?Medication Sig  ? acetaminophen (TYLENOL) 325 MG tablet Take 650 mg by mouth every 6 (six) hours as needed.  ? atorvastatin (LIPITOR) 40 MG tablet Take 1 tablet (40 mg total) by mouth daily at 6 PM.  ? azelastine (ASTELIN) 0.1 % nasal spray Place 1 spray into both nostrils 2 (two) times daily.  ? Carboxymethylcell-Hypromellose (GENTEAL) 0.25-0.3 % GEL Apply 1 application to eye at bedtime.   ? carvedilol (COREG) 12.5 MG tablet Take 12.5 mg by mouth 2 (two) times daily with a meal.   ? cetirizine (ZYRTEC) 10 MG tablet Take 1 tablet (10 mg total) by mouth at bedtime.  ? fluticasone (FLONASE) 50 MCG/ACT nasal spray Place 1 spray into both nostrils daily.   ? furosemide (LASIX) 40 MG tablet TAKE 1 TABLET BY MOUTH TWICE DAILY  ? hydrOXYzine (VISTARIL) 25 MG capsule Take 1 capsule (25 mg total) by mouth 3 (three) times daily as needed.  ? metolazone (ZAROXOLYN) 2.5 MG tablet Take 1 tablet (2.5 mg total) by mouth every other day as needed (Increased swelling, shortness of breath or over 2 pound weight gain).  ? Multiple Vitamins-Minerals (PRESERVISION AREDS PO) Take 1 tablet by mouth 2 (two) times daily.   ? omeprazole (PRILOSEC) 20 MG capsule TAKE 1 CAPSULE BY MOUTH ONCE DAILY  ? polyethylene glycol powder (MIRALAX) 17 GM/SCOOP powder Take 17 g by  mouth 2 (two) times daily as needed for moderate constipation.  ? potassium chloride (KLOR-CON) 10 MEQ tablet TAKE 1 TABLET BY MOUTH TWICE DAILY (Patient taking differently: Take 20 mEq by mouth 2 (two) times daily.)  ? Probiotic Product (ALIGN) 4 MG CAPS Take 8 mg by mouth daily after lunch.  ? senna-docusate (SENOKOT-S) 8.6-50 MG tablet Take 1 tablet by mouth 2 (two) times daily as needed for moderate constipation.  ? traMADol (ULTRAM) 50 MG tablet TAKE 1/2 TABLET BY MOUTH EVERY 4 TO 6 HOURS AS NEEDED   FOR MODERATE PAIN  ? ?No facility-administered medications prior to visit.  ? ? ?Review of Systems  ?Constitutional:  Negative for appetite change, chills, fatigue and fever.  ?Respiratory:  Negative for chest tightness and shortness of breath.   ?Cardiovascular:  Negative for chest pain and palpitations.  ?Gastrointestinal:  Negative for abdominal pain, nausea and vomiting.  ?Neurological:  Negative for dizziness and weakness.  ? ?Last lipids ?Lab Results  ?Component Value Date  ? CHOL 104 03/04/2021  ? HDL 73 03/04/2021  ? [place] 14 03/04/2021  ? TRIG 85 03/04/2021  ? CHOLHDL 1.4 03/04/2021  ? ?  ?  Objective  ?  ?BP 135/77 (BP Location: Left Arm, Patient Position: Sitting, Cuff Size: Normal)   Pulse 65   Temp 98.2 ?F (36.8 ?C) (Temporal)   Resp 18   Ht '5\' 2"'$  (1.575 m)   Wt 135 lb (61.2 kg)   SpO2 95%   BMI 24.69 kg/m?  ?BP Readings from Last 3 Encounters:  ?09/08/21 135/77  ?06/02/21 (!) 145/79  ?05/16/21 130/79  ? ?Wt Readings from Last 3 Encounters:  ?09/08/21 135 lb (61.2 kg)  ?06/02/21 127 lb 3.2 oz (57.7 kg)  ?05/16/21 128 lb 4.9 oz (58.2 kg)  ? ?  ? ?Physical Exam ?Vitals reviewed.  ?Constitutional:   ?   General: She is awake.  ?   Appearance: She is well-developed.  ?HENT:  ?   Head: Normocephalic.  ?Eyes:  ?   Conjunctiva/sclera: Conjunctivae normal.  ?Cardiovascular:  ?   Rate and Rhythm: Normal rate and regular rhythm.  ?   Heart sounds: Murmur heard.  ?   Comments: Loud murmur heard RSB. ?Trace b/l peripheral edema ?Pulmonary:  ?   Effort: Pulmonary effort is normal.  ?   Breath sounds: Normal breath sounds.  ?Skin: ?   General: Skin is warm.  ?Neurological:  ?   Mental Status: She is alert and oriented to person, place, and time.  ?Psychiatric:     ?   Attention and Perception: Attention normal.     ?   Mood and Affect: Mood normal.     ?   Speech: Speech normal.     ?   Behavior: Behavior is cooperative.  ?  ? ? ?No results found for any visits on 09/08/21. ? Assessment & Plan  ?  ? ?1.  Hypertension, unspecified type ? ?- Lipid panel ?- Pro b natriuretic peptide (BNP)[facility] [facility]) ?- TSH ?- Comprehensive Metabolic Panel (CMET) ?- CBC w/Diff/Platelet ? ?2. Mixed hyperlipidemia ?Cut rosuvastatin dose in half if her CK is elevated ?- Lipid panel ?- Pro b natriuretic peptide (BNP)[facility] [facility]) ?- TSH ?- Comprehensive Metabolic Panel (CMET) ?- CBC w/Diff/Platelet ? ?3. Anemia, unspecified type ? ?- Lipid panel ?- Pro b natriuretic peptide (BNP)[facility] [facility]) ?- TSH ?- Comprehensive Metabolic Panel (CMET) ?- CBC w/Diff/Platelet ? ?4. Chronic systolic CHF (congestive heart failure), NYHA  class 2 ([place]) ?Continue to follow weights but right now I think she appears to be stable. ?- Lipid panel ?- Pro b natriuretic peptide (BNP)[facility] [facility]) ?- TSH ?- Comprehensive Metabolic Panel (CMET) ?- CBC w/Diff/Platelet ? ?5. Weight gain ?Weight hopefully is from improved nutrition.  No signs of worsening heart failure. ?I have cut back on her protein drink from 2 daily to 1 daily. ?- Lipid panel ?- Pro b natriuretic peptide (BNP)[facility] [facility]) ?- TSH ?- Comprehensive Metabolic Panel (CMET) ?- CBC w/Diff/Platelet ? ?6. Screening for osteoporosis ?Fall risk  is high so repeat her BMD per ?- Lipid panel ?- Pro b natriuretic peptide (BNP)[facility] [facility]) ?- TSH ?- Comprehensive Metabolic Panel (CMET) ?- CBC w/Diff/Platelet ?- DG Bone Density ? ? ?Return in about 6 months (around 03/11/2022).  ?   ? ? ? ? ?Brantlee Hinde Cranford Mon, MD  ?[facility] ?[phone] (phone) ?[phone] (fax) ? ?[facility] ?

## 2021-09-08 ENCOUNTER — Other Ambulatory Visit: Payer: Self-pay

## 2021-09-08 ENCOUNTER — Ambulatory Visit (INDEPENDENT_AMBULATORY_CARE_PROVIDER_SITE_OTHER): Payer: Medicare Other | Admitting: Family Medicine

## 2021-09-08 ENCOUNTER — Encounter: Payer: Self-pay | Admitting: Family Medicine

## 2021-09-08 VITALS — BP 135/77 | HR 65 | Temp 98.2°F | Resp 18 | Ht 62.0 in | Wt 135.0 lb

## 2021-09-08 DIAGNOSIS — Z13820 Encounter for screening for osteoporosis: Secondary | ICD-10-CM

## 2021-09-08 DIAGNOSIS — D649 Anemia, unspecified: Secondary | ICD-10-CM | POA: Diagnosis not present

## 2021-09-08 DIAGNOSIS — I10 Essential (primary) hypertension: Secondary | ICD-10-CM | POA: Diagnosis not present

## 2021-09-08 DIAGNOSIS — R635 Abnormal weight gain: Secondary | ICD-10-CM

## 2021-09-08 DIAGNOSIS — E782 Mixed hyperlipidemia: Secondary | ICD-10-CM

## 2021-09-08 DIAGNOSIS — I5022 Chronic systolic (congestive) heart failure: Secondary | ICD-10-CM

## 2021-09-08 NOTE — Patient Instructions (Signed)
CUT BACK ON PREMIER TO ONE A DAY. ?

## 2021-09-16 LAB — COMPREHENSIVE METABOLIC PANEL
ALT: 21 IU/L (ref 0–32)
AST: 23 IU/L (ref 0–40)
Albumin/Globulin Ratio: 1.6 (ref 1.2–2.2)
Albumin: 4 g/dL (ref 3.6–4.6)
Alkaline Phosphatase: 115 IU/L (ref 44–121)
BUN/Creatinine Ratio: 35 — ABNORMAL HIGH (ref 12–28)
BUN: 26 mg/dL (ref 8–27)
Bilirubin Total: 1 mg/dL (ref 0.0–1.2)
CO2: 23 mmol/L (ref 20–29)
Calcium: 9.6 mg/dL (ref 8.7–10.3)
Chloride: 103 mmol/L (ref 96–106)
Creatinine, Ser: 0.75 mg/dL (ref 0.57–1.00)
Globulin, Total: 2.5 g/dL (ref 1.5–4.5)
Glucose: 89 mg/dL (ref 70–99)
Potassium: 4.5 mmol/L (ref 3.5–5.2)
Sodium: 138 mmol/L (ref 134–144)
Total Protein: 6.5 g/dL (ref 6.0–8.5)
eGFR: 79 mL/min/{1.73_m2} (ref >59)

## 2021-09-16 LAB — LIPID PANEL
Chol/HDL Ratio: 1.6 ratio (ref 0.0–4.4)
Cholesterol, Total: 111 mg/dL (ref 100–199)
HDL: 68 mg/dL (ref >39)
LDL Chol Calc (NIH): 25 mg/dL (ref 0–99)
Triglycerides: 93 mg/dL (ref 0–149)
VLDL Cholesterol Cal: 18 mg/dL (ref 5–40)

## 2021-09-16 LAB — CBC WITH DIFFERENTIAL/PLATELET
Basophils Absolute: 0.1 10*3/uL (ref 0.0–0.2)
Basos: 1 %
EOS (ABSOLUTE): 0.2 10*3/uL (ref 0.0–0.4)
Eos: 2 %
Hematocrit: 38 % (ref 34.0–46.6)
Hemoglobin: 13.3 g/dL (ref 11.1–15.9)
Immature Grans (Abs): 0 10*3/uL (ref 0.0–0.1)
Immature Granulocytes: 0 %
Lymphocytes Absolute: 4.2 10*3/uL — ABNORMAL HIGH (ref 0.7–3.1)
Lymphs: 37 %
MCH: 31.6 pg (ref 26.6–33.0)
MCHC: 35 g/dL (ref 31.5–35.7)
MCV: 90 fL (ref 79–97)
Monocytes Absolute: 1 10*3/uL — ABNORMAL HIGH (ref 0.1–0.9)
Monocytes: 9 %
Neutrophils Absolute: 5.7 10*3/uL (ref 1.4–7.0)
Neutrophils: 51 %
Platelets: 187 10*3/uL (ref 150–450)
RBC: 4.21 x10E6/uL (ref 3.77–5.28)
RDW: 13.5 % (ref 11.7–15.4)
WBC: 11.2 10*3/uL — ABNORMAL HIGH (ref 3.4–10.8)

## 2021-09-16 LAB — PRO B NATRIURETIC PEPTIDE: NT-Pro BNP: 2064 pg/mL — ABNORMAL HIGH (ref 0–738)

## 2021-09-16 LAB — TSH: TSH: 4.51 u[IU]/mL — ABNORMAL HIGH (ref 0.450–4.500)

## 2021-09-16 NOTE — Progress Notes (Signed)
Called [facility] to add test.  

## 2021-09-18 LAB — SPECIMEN STATUS REPORT

## 2021-09-18 LAB — T4, FREE: Free T4: 1.41 ng/dL (ref 0.82–1.77)

## 2021-09-30 ENCOUNTER — Telehealth: Payer: Self-pay

## 2021-09-30 NOTE — Telephone Encounter (Signed)
Copied from [place] [id number]. Topic: General - Call Back - No Documentation ?>> Sep 30, 2021  1:14 PM Scherrie Gerlach wrote: ?Reason for CRM: pt would like results of additional tsh that was ordered by Dr B.  ?In addition, results of bone density ordered by Dr Rosanna Randy. ?

## 2021-10-07 NOTE — Telephone Encounter (Signed)
Pt called back stating she has not heard back regarding her previous requests, pt had follow up questions regarding lab work and also wanted to go over her bone density test, please advise.  ?

## 2021-10-08 ENCOUNTER — Telehealth: Payer: Self-pay

## 2021-10-08 NOTE — Telephone Encounter (Signed)
Reason for CRM: Pt requests call back to go over results of her bone density test and the recheck of her thyroid ?

## 2021-10-09 MED ORDER — ALENDRONATE SODIUM 70 MG PO TABS: 70.0000 mg | ORAL_TABLET | ORAL | 11 refills | Status: DC

## 2021-11-07 ENCOUNTER — Ambulatory Visit: Payer: Self-pay | Admitting: *Deleted

## 2021-11-07 NOTE — Telephone Encounter (Signed)
Patient was notified.

## 2021-11-07 NOTE — Telephone Encounter (Signed)
Summary: med check to take Iburprofen   Pt called and asked if the nurse can check her medications / and see if its ok for her take Ibuprofen / please advise     Per [employer]: IBUPROFEN -- FUROSEMIDE    Major  Good Concurrent use of FUROSEMIDE and NSAIDS may result in reduced diuretic effectiveness and possible nephrotoxicity.  IBUPROFEN -- METOLAZONE    Major  Good Concurrent use of NSAIDS and THIAZIDE DIURETICS may result in reduced diuretic effectiveness and possible nephrotoxicity.  CARVEDILOL -- IBUPROFEN    Moderate  Good Concurrent use of BETA-ADRENERGIC BLOCKERS and NSAIDS may result in reduced antihypertensive effect.   Reason for Disposition  [1] Caller has medicine question about med NOT prescribed by PCP AND [2] triager unable to answer question (e.g., compatibility with other med, storage)  Answer Assessment - Initial Assessment Questions 1. NAME of MEDICATION: "What medicine are you calling about?"     Ibuprofen 2. QUESTION: "What is your question?" (e.g., double dose of medicine, side effect)     Patient has had dental work and wants to know if she can take Ibuprofen for inflammation  Per [employer]- Ibuprofen does interfere with some of her medications- will send to provider to see if short term use would be allowable.  Protocols used: Medication Question Call-A-AH

## 2021-11-07 NOTE — Telephone Encounter (Signed)
Please advise 

## 2021-12-15 ENCOUNTER — Other Ambulatory Visit: Payer: Self-pay | Admitting: Family Medicine

## 2021-12-15 DIAGNOSIS — I5043 Acute on chronic combined systolic (congestive) and diastolic (congestive) heart failure: Secondary | ICD-10-CM

## 2022-01-06 NOTE — Progress Notes (Unsigned)
   Argentina Ponder DeSanto,acting as a scribe for [name], MD.,have documented all relevant documentation on the behalf of [name], MD,as directed by  [name], MD while in the presence of [name], MD.     Established patient visit   Patient: Teresa Moyer   DOB: 06-08-1936   86 y.o. Female  MRN: [id number] Visit Date: 01/07/2022  Today's healthcare provider: [name], MD   No chief complaint on file.  Subjective    HPI  Patient is an 86 year ld female who presents with possible bowel incontinence.  Medications: Outpatient Medications Prior to Visit  Medication Sig   acetaminophen (TYLENOL) 325 MG tablet Take 650 mg by mouth every 6 (six) hours as needed.   alendronate (FOSAMAX) 70 MG tablet Take 1 tablet (70 mg total) by mouth every 7 (seven) days. Take with a full glass of water on an empty stomach.   atorvastatin (LIPITOR) 40 MG tablet Take 1 tablet (40 mg total) by mouth daily at 6 PM.   azelastine (ASTELIN) 0.1 % nasal spray Place 1 spray into both nostrils 2 (two) times daily.   Carboxymethylcell-Hypromellose (GENTEAL) 0.25-0.3 % GEL Apply 1 application to eye at bedtime.    carvedilol (COREG) 12.5 MG tablet Take 12.5 mg by mouth 2 (two) times daily with a meal.    cetirizine (ZYRTEC) 10 MG tablet Take 1 tablet (10 mg total) by mouth at bedtime.   fluticasone (FLONASE) 50 MCG/ACT nasal spray Place 1 spray into both nostrils daily.    furosemide (LASIX) 40 MG tablet TAKE 1 TABLET BY MOUTH TWICE DAILY   hydrOXYzine (VISTARIL) 25 MG capsule Take 1 capsule (25 mg total) by mouth 3 (three) times daily as needed.   metolazone (ZAROXOLYN) 2.5 MG tablet Take 1 tablet (2.5 mg total) by mouth every other day as needed (Increased swelling, shortness of breath or over 2 pound weight gain).   Multiple Vitamins-Minerals (PRESERVISION AREDS PO) Take 1 tablet by mouth 2 (two) times daily.    omeprazole (PRILOSEC) 20 MG capsule TAKE 1 CAPSULE BY  MOUTH ONCE DAILY   polyethylene glycol powder (MIRALAX) 17 GM/SCOOP powder Take 17 g by mouth 2 (two) times daily as needed for moderate constipation.   potassium chloride (KLOR-CON) 10 MEQ tablet TAKE 1 TABLET BY MOUTH TWICE DAILY (Patient taking differently: Take 20 mEq by mouth 2 (two) times daily.)   Probiotic Product (ALIGN) 4 MG CAPS Take 8 mg by mouth daily after lunch.   senna-docusate (SENOKOT-S) 8.6-50 MG tablet Take 1 tablet by mouth 2 (two) times daily as needed for moderate constipation.   traMADol (ULTRAM) 50 MG tablet TAKE 1/2 TABLET BY MOUTH EVERY 4 TO 6 HOURS AS NEEDED  FOR MODERATE PAIN   No facility-administered medications prior to visit.    Review of Systems  {Labs  Heme  Chem  Endocrine  Serology  Results Review (optional):23779}   Objective    There were no vitals taken for this visit. {Show previous vital signs (optional):23777}  Physical Exam  ***  No results found for any visits on 01/07/22.  Assessment & Plan     ***  No follow-ups on file.      {provider attestation***:1}   [name], MD  [facility] [phone] (phone) [phone] (fax)  [place]

## 2022-01-07 ENCOUNTER — Ambulatory Visit (INDEPENDENT_AMBULATORY_CARE_PROVIDER_SITE_OTHER): Payer: Medicare Other | Admitting: Family Medicine

## 2022-01-07 VITALS — BP 137/79 | HR 63 | Temp 97.7°F | Wt 142.0 lb

## 2022-01-07 DIAGNOSIS — I5022 Chronic systolic (congestive) heart failure: Secondary | ICD-10-CM | POA: Diagnosis not present

## 2022-01-07 DIAGNOSIS — R159 Full incontinence of feces: Secondary | ICD-10-CM

## 2022-01-07 DIAGNOSIS — I25118 Atherosclerotic heart disease of native coronary artery with other forms of angina pectoris: Secondary | ICD-10-CM

## 2022-01-07 DIAGNOSIS — E876 Hypokalemia: Secondary | ICD-10-CM

## 2022-01-07 DIAGNOSIS — E871 Hypo-osmolality and hyponatremia: Secondary | ICD-10-CM

## 2022-01-07 NOTE — Patient Instructions (Signed)
Stop daily protein drink.

## 2022-01-08 LAB — COMPREHENSIVE METABOLIC PANEL
ALT: 66 IU/L — ABNORMAL HIGH (ref 0–32)
AST: 83 IU/L — ABNORMAL HIGH (ref 0–40)
Albumin/Globulin Ratio: 1.8 (ref 1.2–2.2)
Albumin: 4.2 g/dL (ref 3.7–4.7)
Alkaline Phosphatase: 116 IU/L (ref 44–121)
BUN/Creatinine Ratio: 29 — ABNORMAL HIGH (ref 12–28)
BUN: 25 mg/dL (ref 8–27)
Bilirubin Total: 1.2 mg/dL (ref 0.0–1.2)
CO2: 24 mmol/L (ref 20–29)
Calcium: 9.3 mg/dL (ref 8.7–10.3)
Chloride: 99 mmol/L (ref 96–106)
Creatinine, Ser: 0.87 mg/dL (ref 0.57–1.00)
Globulin, Total: 2.4 g/dL (ref 1.5–4.5)
Glucose: 101 mg/dL — ABNORMAL HIGH (ref 70–99)
Potassium: 4.2 mmol/L (ref 3.5–5.2)
Sodium: 138 mmol/L (ref 134–144)
Total Protein: 6.6 g/dL (ref 6.0–8.5)
eGFR: 66 mL/min/{1.73_m2} (ref >59)

## 2022-01-09 ENCOUNTER — Telehealth: Payer: Self-pay | Admitting: Family Medicine

## 2022-01-09 NOTE — Telephone Encounter (Signed)
Patient inquiring about 01/07/2022 lab results.

## 2022-01-12 ENCOUNTER — Ambulatory Visit: Payer: Self-pay | Admitting: *Deleted

## 2022-01-12 NOTE — Telephone Encounter (Signed)
Summary: antibiotics ? bowel leakage   Patient called in, has bowel leakage still after stopping the primera drink for 5 days that she was taking. She states Dr Rosanna Randy wants to put her on antibiotics. She already has appt for 08/28.  Pharmacy is [place], [place] [address]  Phone: [phone] Fax: [phone]  Hours: Not open 24 hours        Chief Complaint: requesting medication due to fecal incontinence Symptoms: continues to have episodes of fecal incontinence every am after sleeping .  Frequency: na Pertinent Negatives: Patient denies fever, no abdominal pain no blood in stool Disposition: '[]'$ ED /'[]'$ Urgent Care (no appt availability in office) / '[]'$ Appointment(In office/virtual)/ '[]'$  [place] [facility]/ '[]'$ Home Care/ '[]'$ Refused Recommended Disposition /'[]'$ [place] [facility]/ '[x]'$  Follow-up with PCP Additional Notes:   PCP requesting patient to call back if sx not better . 01/07/22 OV notes recommend prescribing flagyl if bowel incontinence continues . Please advise and call patient if/ when rx sent to pharmacy so she can pick it up.       Reason for Disposition  Prescription request for new medicine (not a refill)  Answer Assessment - Initial Assessment Questions 1. NAME of MEDICINE: "What medicine(s) are you calling about?"     flagyl 2. QUESTION: "What is your question?" (e.g., double dose of medicine, side effect)     Does PCP want to start medication? Last OV discussed if patient continues with fecal incontinence to start medication 3. PRESCRIBER: "Who prescribed the medicine?" Reason: if prescribed by specialist, call should be referred to that group.     na 4. SYMPTOMS: "Do you have any symptoms?" If Yes, ask: "What symptoms are you having?"  "How bad are the symptoms (e.g., mild, moderate, severe)     Continues to have incontinence of stool every am after sleeping  5. PREGNANCY:  "Is there any chance that you are  pregnant?" "When was your last menstrual period?"     na  Protocols used: Medication Question Call-A-AH

## 2022-01-12 NOTE — Telephone Encounter (Signed)
Patient called in again about lab results. Please call back

## 2022-01-13 ENCOUNTER — Other Ambulatory Visit: Payer: Self-pay | Admitting: Physician Assistant

## 2022-01-13 DIAGNOSIS — R159 Full incontinence of feces: Secondary | ICD-10-CM

## 2022-01-13 MED ORDER — METRONIDAZOLE 500 MG PO TABS: 500.0000 mg | ORAL_TABLET | Freq: Two times a day (BID) | ORAL | 0 refills | Status: AC

## 2022-02-06 NOTE — Progress Notes (Unsigned)
Established patient visit   Patient: Teresa Moyer   DOB: 10/04/1935   86 y.o. Female  MRN: [id number] Visit Date: 02/09/2022  Today's healthcare provider: [name], MD   No chief complaint on file.  Subjective    HPI  Patient originally got better with the Flagyl but again is having issues.  She would like to try the Flagyl again.  She has well water.  She is also on Senokot and GlycoLax.  Follow up for Fecal soiling due to fecal incontinence:  The patient was last seen for this 1 months ago. Changes made at last visit include; At this time have discussed with patient and will have her stop her protein drinks she is taking daily.  If not improving will consider Flagyl as it could be GERD although unlikely.  Would then consider GI referral. She reports excellent compliance with treatment. She feels that condition is Unchanged. She is not having side effects.   -----------------------------------------------------------------------------------------   Medications: Outpatient Medications Prior to Visit  Medication Sig   acetaminophen (TYLENOL) 325 MG tablet Take 650 mg by mouth every 6 (six) hours as needed.   alendronate (FOSAMAX) 70 MG tablet Take 1 tablet (70 mg total) by mouth every 7 (seven) days. Take with a full glass of water on an empty stomach.   atorvastatin (LIPITOR) 40 MG tablet Take 1 tablet (40 mg total) by mouth daily at 6 PM.   azelastine (ASTELIN) 0.1 % nasal spray Place 1 spray into both nostrils 2 (two) times daily.   Carboxymethylcell-Hypromellose (GENTEAL) 0.25-0.3 % GEL Apply 1 application to eye at bedtime.    carvedilol (COREG) 12.5 MG tablet Take 12.5 mg by mouth 2 (two) times daily with a meal.    cetirizine (ZYRTEC) 10 MG tablet Take 1 tablet (10 mg total) by mouth at bedtime.   fluticasone (FLONASE) 50 MCG/ACT nasal spray Place 1 spray into both nostrils daily.    furosemide (LASIX) 40 MG tablet TAKE 1 TABLET BY MOUTH TWICE DAILY    hydrOXYzine (VISTARIL) 25 MG capsule Take 1 capsule (25 mg total) by mouth 3 (three) times daily as needed.   metolazone (ZAROXOLYN) 2.5 MG tablet Take 1 tablet (2.5 mg total) by mouth every other day as needed (Increased swelling, shortness of breath or over 2 pound weight gain).   Multiple Vitamins-Minerals (PRESERVISION AREDS PO) Take 1 tablet by mouth 2 (two) times daily.    omeprazole (PRILOSEC) 20 MG capsule TAKE 1 CAPSULE BY MOUTH ONCE DAILY   polyethylene glycol powder (MIRALAX) 17 GM/SCOOP powder Take 17 g by mouth 2 (two) times daily as needed for moderate constipation.   potassium chloride (KLOR-CON) 10 MEQ tablet TAKE 1 TABLET BY MOUTH TWICE DAILY (Patient taking differently: Take 20 mEq by mouth 2 (two) times daily.)   Probiotic Product (ALIGN) 4 MG CAPS Take 8 mg by mouth daily after lunch.   senna-docusate (SENOKOT-S) 8.6-50 MG tablet Take 1 tablet by mouth 2 (two) times daily as needed for moderate constipation.   traMADol (ULTRAM) 50 MG tablet TAKE 1/2 TABLET BY MOUTH EVERY 4 TO 6 HOURS AS NEEDED  FOR MODERATE PAIN   No facility-administered medications prior to visit.    Review of Systems  Constitutional:  Negative for appetite change, chills, fatigue and fever.  Respiratory:  Negative for chest tightness and shortness of breath.   Cardiovascular:  Negative for chest pain and palpitations.  Gastrointestinal:  Negative for abdominal pain, nausea and vomiting.  Neurological:  Negative for dizziness and weakness.       Objective    BP (!) 134/56 (BP Location: Left Arm, Patient Position: Sitting, Cuff Size: Normal)   Pulse 70   Temp 98.1 F (36.7 C)   Resp 16   Ht '5\' 1"'$  (1.549 m)   Wt 145 lb (65.8 kg)   SpO2 98%   BMI 27.40 kg/m    Physical Exam Vitals reviewed.  Constitutional:      General: She is not in acute distress.    Appearance: She is well-developed.  HENT:     Head: Normocephalic and atraumatic.     Right Ear: Hearing normal.     Left Ear: Hearing  normal.     Nose: Nose normal.  Eyes:     General: Lids are normal. No scleral icterus.       Right eye: No discharge.        Left eye: No discharge.     Conjunctiva/sclera: Conjunctivae normal.  Cardiovascular:     Rate and Rhythm: Normal rate and regular rhythm.     Heart sounds: Normal heart sounds.  Pulmonary:     Effort: Pulmonary effort is normal. No respiratory distress.  Abdominal:     Palpations: Abdomen is soft. There is no mass.  Skin:    Findings: No lesion or rash.  Neurological:     General: No focal deficit present.     Mental Status: She is alert and oriented to person, place, and time.  Psychiatric:        Mood and Affect: Mood normal.        Speech: Speech normal.        Behavior: Behavior normal.        Thought Content: Thought content normal.        Judgment: Judgment normal.       No results found for any visits on 02/09/22.  Assessment & Plan     1. Fecal soiling due to fecal incontinence Try another week of Flagyl as this seemed to help.  It is certainly possible that Giardia or other infection is contributing. Stop Senokot and GlycoLax.  Refer to GI if this problem is really bothering patient. - Ambulatory referral to Gastroenterology  2. Chronic systolic CHF (congestive heart failure), NYHA class 2 ([place]) By cardiology and CHF clinic - Comprehensive Metabolic Panel (CMET)  3. Hypokalemia  - Comprehensive Metabolic Panel (CMET)  4. Hyponatremia  - Comprehensive Metabolic Panel (CMET)  5. Hypertension, unspecified type  - CBC w/Diff/Platelet  6. Leukocytosis, unspecified type Refer to hematology if needed - CBC w/Diff/Platelet   No follow-ups on file.      I, [name], MD, have reviewed all documentation for this visit. The documentation on 02/10/22 for the exam, diagnosis, procedures, and orders are all accurate and complete.    Carrol Bondar Cranford Mon, MD  [facility] [phone] (phone) [phone]  (fax)  [place]

## 2022-02-09 ENCOUNTER — Encounter: Payer: Self-pay | Admitting: Family Medicine

## 2022-02-09 ENCOUNTER — Ambulatory Visit (INDEPENDENT_AMBULATORY_CARE_PROVIDER_SITE_OTHER): Payer: Medicare Other | Admitting: Family Medicine

## 2022-02-09 VITALS — BP 134/56 | HR 70 | Temp 98.1°F | Resp 16 | Ht 61.0 in | Wt 145.0 lb

## 2022-02-09 DIAGNOSIS — E876 Hypokalemia: Secondary | ICD-10-CM

## 2022-02-09 DIAGNOSIS — I10 Essential (primary) hypertension: Secondary | ICD-10-CM

## 2022-02-09 DIAGNOSIS — I5022 Chronic systolic (congestive) heart failure: Secondary | ICD-10-CM

## 2022-02-09 DIAGNOSIS — D72829 Elevated white blood cell count, unspecified: Secondary | ICD-10-CM

## 2022-02-09 DIAGNOSIS — R159 Full incontinence of feces: Secondary | ICD-10-CM | POA: Diagnosis not present

## 2022-02-09 DIAGNOSIS — E871 Hypo-osmolality and hyponatremia: Secondary | ICD-10-CM | POA: Diagnosis not present

## 2022-02-09 MED ORDER — METRONIDAZOLE 500 MG PO TABS: 500.0000 mg | ORAL_TABLET | Freq: Three times a day (TID) | ORAL | 0 refills | Status: DC

## 2022-02-12 LAB — CBC WITH DIFFERENTIAL/PLATELET
Basophils Absolute: 0.1 10*3/uL (ref 0.0–0.2)
Basos: 1 %
EOS (ABSOLUTE): 0.1 10*3/uL (ref 0.0–0.4)
Eos: 1 %
Hematocrit: 40.3 % (ref 34.0–46.6)
Hemoglobin: 13.7 g/dL (ref 11.1–15.9)
Immature Grans (Abs): 0 10*3/uL (ref 0.0–0.1)
Immature Granulocytes: 0 %
Lymphocytes Absolute: 6.3 10*3/uL — ABNORMAL HIGH (ref 0.7–3.1)
Lymphs: 50 %
MCH: 32.3 pg (ref 26.6–33.0)
MCHC: 34 g/dL (ref 31.5–35.7)
MCV: 95 fL (ref 79–97)
Monocytes Absolute: 0.9 10*3/uL (ref 0.1–0.9)
Monocytes: 7 %
Neutrophils Absolute: 5.1 10*3/uL (ref 1.4–7.0)
Neutrophils: 41 %
Platelets: 178 10*3/uL (ref 150–450)
RBC: 4.24 x10E6/uL (ref 3.77–5.28)
RDW: 12.9 % (ref 11.7–15.4)
WBC: 12.5 10*3/uL — ABNORMAL HIGH (ref 3.4–10.8)

## 2022-02-12 LAB — COMPREHENSIVE METABOLIC PANEL
ALT: 16 IU/L (ref 0–32)
AST: 19 IU/L (ref 0–40)
Albumin/Globulin Ratio: 1.8 (ref 1.2–2.2)
Albumin: 4.3 g/dL (ref 3.7–4.7)
Alkaline Phosphatase: 88 IU/L (ref 44–121)
BUN/Creatinine Ratio: 23 (ref 12–28)
BUN: 18 mg/dL (ref 8–27)
Bilirubin Total: 0.8 mg/dL (ref 0.0–1.2)
CO2: 23 mmol/L (ref 20–29)
Calcium: 9.1 mg/dL (ref 8.7–10.3)
Chloride: 101 mmol/L (ref 96–106)
Creatinine, Ser: 0.79 mg/dL (ref 0.57–1.00)
Globulin, Total: 2.4 g/dL (ref 1.5–4.5)
Glucose: 155 mg/dL — ABNORMAL HIGH (ref 70–99)
Potassium: 4.1 mmol/L (ref 3.5–5.2)
Sodium: 140 mmol/L (ref 134–144)
Total Protein: 6.7 g/dL (ref 6.0–8.5)
eGFR: 74 mL/min/{1.73_m2} (ref >59)

## 2022-03-11 ENCOUNTER — Encounter: Payer: Medicare Other | Admitting: Family Medicine

## 2022-03-11 ENCOUNTER — Encounter: Payer: Self-pay | Admitting: Physician Assistant

## 2022-03-11 ENCOUNTER — Ambulatory Visit (INDEPENDENT_AMBULATORY_CARE_PROVIDER_SITE_OTHER): Payer: Medicare Other | Admitting: Physician Assistant

## 2022-03-11 VITALS — BP 137/68 | HR 73 | Wt 145.0 lb

## 2022-03-11 DIAGNOSIS — R739 Hyperglycemia, unspecified: Secondary | ICD-10-CM

## 2022-03-11 DIAGNOSIS — R7989 Other specified abnormal findings of blood chemistry: Secondary | ICD-10-CM | POA: Diagnosis not present

## 2022-03-11 DIAGNOSIS — I5022 Chronic systolic (congestive) heart failure: Secondary | ICD-10-CM | POA: Diagnosis not present

## 2022-03-11 DIAGNOSIS — I25118 Atherosclerotic heart disease of native coronary artery with other forms of angina pectoris: Secondary | ICD-10-CM

## 2022-03-11 DIAGNOSIS — Z0000 Encounter for general adult medical examination without abnormal findings: Secondary | ICD-10-CM | POA: Diagnosis not present

## 2022-03-11 DIAGNOSIS — Z23 Encounter for immunization: Secondary | ICD-10-CM | POA: Diagnosis not present

## 2022-03-11 DIAGNOSIS — R159 Full incontinence of feces: Secondary | ICD-10-CM

## 2022-03-11 NOTE — Assessment & Plan Note (Addendum)
Stable euvolemic today Reviewed last cmp  F/b cardiology

## 2022-03-11 NOTE — Assessment & Plan Note (Signed)
Pt manages with atorvastatin, continue meds, f/b cardiology

## 2022-03-11 NOTE — Assessment & Plan Note (Signed)
Resolved, will monitor. Pt has gi appt in 1/24 advised if symptoms recur to call office and we can try to coordinate an earlier appointment

## 2022-03-11 NOTE — Progress Notes (Signed)
I,Teresa Moyer,acting as a Education administrator for [employer], PA-C.,have documented all relevant documentation on the behalf of Teresa Kirschner, PA-C,as directed by  Teresa Kirschner, PA-C while in the presence of Teresa Kirschner, PA-C.   Annual Wellness Visit     Patient: Teresa Moyer, Female    DOB: [birth year], 86 y.o.   MRN: [id number] Visit Date: 03/11/2022  Today's Provider: [name], PA-C   Cc. awv  Subjective    Teresa Moyer is a 86 y.o. female who presents today for her Annual Wellness Visit. She reports consuming a low sodium diet.  The patient reports walking daily for at least 10 minutes.  She generally feels well. She reports sleeping fairly well. She does not have additional problems to discuss today.   HPI Pt has been dealing with stool incontinence at night for the last 2-3 months, pt reports for the last 5 days she has been clear, and denies any abdominal pain, distention. She completed courses of antibiotics as prescribed.   Medications: Outpatient Medications Prior to Visit  Medication Sig   acetaminophen (TYLENOL) 650 MG CR tablet Take by mouth.   alendronate (FOSAMAX) 70 MG tablet Take 1 tablet (70 mg total) by mouth every 7 (seven) days. Take with a full glass of water on an empty stomach.   atorvastatin (LIPITOR) 40 MG tablet Take 1 tablet (40 mg total) by mouth daily at 6 PM.   azelastine (ASTELIN) 0.1 % nasal spray Place 1 spray into both nostrils 2 (two) times daily.   carvedilol (COREG) 12.5 MG tablet Take 12.5 mg by mouth 2 (two) times daily with a meal.    cetirizine (ZYRTEC) 10 MG tablet Take 1 tablet (10 mg total) by mouth at bedtime.   fluticasone (FLONASE) 50 MCG/ACT nasal spray Place 1 spray into both nostrils daily.    furosemide (LASIX) 40 MG tablet TAKE 1 TABLET BY MOUTH TWICE DAILY   Multiple Vitamins-Minerals (PRESERVISION AREDS PO) Take 1 tablet by mouth 2 (two) times daily.    omeprazole (PRILOSEC) 20 MG capsule TAKE 1 CAPSULE BY MOUTH  ONCE DAILY   polyethylene glycol powder (MIRALAX) 17 GM/SCOOP powder Take 17 g by mouth 2 (two) times daily as needed for moderate constipation.   potassium chloride (KLOR-CON) 10 MEQ tablet Take 2 tablets by mouth daily.   Probiotic Product (ALIGN) 4 MG CAPS Take 8 mg by mouth daily after lunch.   sacubitril-valsartan (ENTRESTO) 49-51 MG Take 1 tablet by mouth 2 (two) times daily.   senna-docusate (SENOKOT-S) 8.6-50 MG tablet Take 1 tablet by mouth 2 (two) times daily as needed for moderate constipation.   acetaminophen (TYLENOL) 325 MG tablet Take 650 mg by mouth every 6 (six) hours as needed. (Patient not taking: Reported on 03/11/2022)   Carboxymethylcell-Hypromellose (GENTEAL) 0.25-0.3 % GEL Apply 1 application to eye at bedtime.    hydrOXYzine (VISTARIL) 25 MG capsule Take 1 capsule (25 mg total) by mouth 3 (three) times daily as needed.   metolazone (ZAROXOLYN) 2.5 MG tablet Take 1 tablet (2.5 mg total) by mouth every other day as needed (Increased swelling, shortness of breath or over 2 pound weight gain).   metroNIDAZOLE (FLAGYL) 500 MG tablet Take 1 tablet (500 mg total) by mouth 3 (three) times daily.   potassium chloride (KLOR-CON) 10 MEQ tablet TAKE 1 TABLET BY MOUTH TWICE DAILY (Patient not taking: Reported on 03/11/2022)   traMADol (ULTRAM) 50 MG tablet TAKE 1/2 TABLET BY MOUTH EVERY 4 TO 6 HOURS AS NEEDED  FOR MODERATE PAIN   No facility-administered medications prior to visit.    Allergies  Allergen Reactions   Accupril [Quinapril Hcl] Hives and Swelling    SWELLING OF NOSE/FACE   Sulfa Antibiotics Shortness Of Breath and Swelling   Clinoril [Sulindac] Other (See Comments)    Unsure of reaction type   Hydrochlorothiazide Other (See Comments)    Unsure of reaction type   Spironolactone Diarrhea   Tessalon [Benzonatate] Other (See Comments)    Unsure of reaction type   Amoxicillin Rash    Has patient had a PCN reaction causing immediate rash, facial/tongue/throat swelling,  SOB or lightheadedness with hypotension: Unknown Has patient had a PCN reaction causing severe rash involving mucus membranes or skin necrosis: Unknown Has patient had a PCN reaction that required hospitalization: No Has patient had a PCN reaction occurring within the last 10 years:Possibly unsure  If all of the above answers are "NO", then may proceed with Cephalosporin use.    Codeine Sulfate Nausea And Vomiting   Penicillins Rash    Has patient had a PCN reaction causing immediate rash, facial/tongue/throat swelling, SOB or lightheadedness with hypotension: Unknown Has patient had a PCN reaction causing severe rash involving mucus membranes or skin necrosis: Unknown Has patient had a PCN reaction that required hospitalization: No Has patient had a PCN reaction occurring within the last 10 years:Possibly unsure  If all of the above answers are "NO", then may proceed with Cephalosporin use.    Patient Care Team: Jerrol Banana., MD as PCP - General (Family Medicine) Dasher, Rayvon Char, MD as Consulting Physician (Dermatology) Corey Skains, MD as Consulting Physician (Cardiology) Clyde Canterbury, MD as Referring Physician (Otolaryngology) Grant Fontana, DC as Referring Physician (Chiropractic Medicine) Pa, [place], Provider Not In [place], [place] as Physician Assistant (Physician Assistant) Marry Guan, Laurice Record, MD (Orthopedic Surgery)  Review of Systems  Constitutional:  Negative for fatigue and fever.  Respiratory:  Negative for cough and shortness of breath.   Cardiovascular:  Negative for chest pain and leg swelling.  Gastrointestinal:  Negative for abdominal pain.  Neurological:  Negative for dizziness and headaches.     Objective    Blood pressure 137/68, pulse 73, weight 145 lb (65.8 kg), SpO2 98 %.    Physical Exam Constitutional:      General: She is awake.     Appearance: She is well-developed.  HENT:     Head: Normocephalic.  Eyes:      Conjunctiva/sclera: Conjunctivae normal.  Cardiovascular:     Rate and Rhythm: Normal rate and regular rhythm.     Heart sounds: Normal heart sounds.  Pulmonary:     Effort: Pulmonary effort is normal.     Breath sounds: Normal breath sounds.  Musculoskeletal:     Right lower leg: No edema.     Left lower leg: No edema.  Skin:    General: Skin is warm.  Neurological:     Mental Status: She is alert and oriented to person, place, and time.  Psychiatric:        Attention and Perception: Attention normal.        Mood and Affect: Mood normal.        Speech: Speech normal.        Behavior: Behavior is cooperative.    Most recent functional status assessment:    03/11/2022    2:24 PM  In your present state of health, do you have any difficulty performing  the following activities:  Hearing? 0  Vision? 1  Difficulty concentrating or making decisions? 0  Walking or climbing stairs? 1  Dressing or bathing? 0  Doing errands, shopping? 1  Comment needs someone to drive her and thats it   Most recent fall risk assessment:    03/11/2022    2:21 PM  [place] in the past year? 0  Number falls in past yr: 0  Injury with Fall? 0  Risk for fall due to : No Fall Risks  Follow up Falls evaluation completed    Most recent depression screenings:    03/11/2022    2:22 PM 08/13/2020    2:00 PM  PHQ 2/9 Scores  PHQ - 2 Score 0 1  PHQ- 9 Score 1    Most recent cognitive screening:    08/09/2019   11:21 AM  6CIT Screen  What Year? 0 points  What month? 0 points  What time? 0 points  Count back from 20 0 points  Months in reverse 0 points  Repeat phrase 0 points  Total Score 0 points   Most recent Audit-C alcohol use screening    08/13/2020    2:00 PM  Alcohol Use Disorder Test (AUDIT)  1. How often do you have a drink containing alcohol? 0  2. How many drinks containing alcohol do you have on a typical day when you are drinking? 0  3. How often do you have six or more  drinks on one occasion? 0  AUDIT-C Score 0  Alcohol Brief Interventions/Follow-up AUDIT Score <7 follow-up not indicated   A score of 3 or more in women, and 4 or more in men indicates increased risk for alcohol abuse, EXCEPT if all of the points are from question 1   No results found for any visits on 03/11/22.  Assessment & Plan     Annual wellness visit done today including the all of the following: Reviewed patient's Family Medical History Reviewed and updated list of patient's medical providers Assessment of cognitive impairment was done Assessed patient's functional ability Established a written schedule for health screening [place] Completed and Reviewed  Exercise Activities and Dietary recommendations --balanced diet high in fiber and protein, low in sugars, carbs, fats. --physical activity/exercise 30 minutes 3-5 times a week     Immunization History  Administered Date(s) Administered   Fluad Quad(high Dose 65+) 03/21/2019, 05/22/2020, 03/04/2021, 03/11/2022   Influenza, High Dose Seasonal PF 02/19/2016, 02/23/2017, 03/16/2018   Influenza-Unspecified 03/16/2015   PFIZER(Purple Top)SARS-COV-2 Vaccination 07/11/2019, 08/01/2019, 04/29/2020   Pneumococcal Conjugate-13 01/04/2014   Pneumococcal Polysaccharide-23 11/22/2006   Zoster, Live 04/08/2010    Health Maintenance  Topic Date Due   Zoster Vaccines- Shingrix (1 of 2) Never done   TETANUS/TDAP  06/15/2017   COVID-19 Vaccine (4 - Pfizer risk series) 06/24/2020   MAMMOGRAM  06/03/2022   Pneumonia Vaccine 54+ Years old  Completed   INFLUENZA VACCINE  Completed   DEXA SCAN  Completed   HPV VACCINES  Aged Out     Discussed health benefits of physical activity, and encouraged her to engage in regular exercise appropriate for her age and condition.    Problem List Items Addressed This Visit       Cardiovascular and Mediastinum   Coronary artery disease involving native coronary artery of  native heart    Pt manages with atorvastatin, continue meds, f/b cardiology      Relevant Medications   sacubitril-valsartan (ENTRESTO)  49-51 MG   Chronic systolic CHF (congestive heart failure), NYHA class 2 ([facility])    Stable euvolemic today Reviewed last cmp  F/b cardiology       Relevant Medications   sacubitril-valsartan (ENTRESTO) 49-51 MG   Other Relevant Orders   CBC w/Diff/Platelet     Other   Abnormal thyroid blood test    Historically will repeat labs      Relevant Orders   TSH + free T4   Incontinence of feces    Resolved, will monitor. Pt has gi appt in 1/24 advised if symptoms recur to call office and we can try to coordinate an earlier appointment      Other Visit Diagnoses     Encounter for Medicare annual wellness exam    -  Primary   Hyperglycemia       Relevant Orders   HgB A1c   Need for immunization against influenza       Relevant Orders   Flu Vaccine QUAD High Dose(Fluad) (Completed)        Return in about 4 months (around 07/11/2022) for chronic conditions.     I, Teresa Kirschner, PA-C have reviewed all documentation for this visit. The documentation on  03/11/2022 for the exam, diagnosis, procedures, and orders are all accurate and complete.  Teresa Kirschner, PA-C [facility] [address] [place], [place], [address] Office: [phone] Fax: [place]

## 2022-03-11 NOTE — Assessment & Plan Note (Signed)
Historically will repeat labs

## 2022-03-12 LAB — CBC WITH DIFFERENTIAL/PLATELET
Basophils Absolute: 0.1 10*3/uL (ref 0.0–0.2)
Basos: 1 %
EOS (ABSOLUTE): 0.1 10*3/uL (ref 0.0–0.4)
Eos: 1 %
Hematocrit: 38.9 % (ref 34.0–46.6)
Hemoglobin: 13.4 g/dL (ref 11.1–15.9)
Immature Grans (Abs): 0 10*3/uL (ref 0.0–0.1)
Immature Granulocytes: 0 %
Lymphocytes Absolute: 5.8 10*3/uL — ABNORMAL HIGH (ref 0.7–3.1)
Lymphs: 51 %
MCH: 31.4 pg (ref 26.6–33.0)
MCHC: 34.4 g/dL (ref 31.5–35.7)
MCV: 91 fL (ref 79–97)
Monocytes Absolute: 1 10*3/uL — ABNORMAL HIGH (ref 0.1–0.9)
Monocytes: 9 %
Neutrophils Absolute: 4.2 10*3/uL (ref 1.4–7.0)
Neutrophils: 38 %
Platelets: 171 10*3/uL (ref 150–450)
RBC: 4.27 x10E6/uL (ref 3.77–5.28)
RDW: 12.3 % (ref 11.7–15.4)
WBC: 11.2 10*3/uL — ABNORMAL HIGH (ref 3.4–10.8)

## 2022-03-12 LAB — TSH+FREE T4
Free T4: 1.32 ng/dL (ref 0.82–1.77)
TSH: 2.05 u[IU]/mL (ref 0.450–4.500)

## 2022-03-12 LAB — HEMOGLOBIN A1C
Est. average glucose Bld gHb Est-mCnc: 128 mg/dL
Hgb A1c MFr Bld: 6.1 % — ABNORMAL HIGH (ref 4.8–5.6)

## 2022-04-08 ENCOUNTER — Telehealth: Payer: Self-pay

## 2022-04-08 NOTE — Telephone Encounter (Addendum)
Pt states she is really anxious for an appt at [place]. Pt states Ria Comment told her she could get her a [place] appt. Please call, as pt would like to get in next week.

## 2022-04-08 NOTE — Telephone Encounter (Signed)
Patient advised. Verbalized understanding 

## 2022-04-08 NOTE — Telephone Encounter (Signed)
Copied from [place] [id number]. Topic: Referral - Status >> Apr 08, 2022 11:58 AM [name] wrote: Reason for CRM: Pt stated Teresa Moyer told her to call back if she needed to be seen sooner for incontinence. Pt requests that her appt with GI be scheduled before January. Pt requests call back

## 2022-04-13 ENCOUNTER — Encounter (INDEPENDENT_AMBULATORY_CARE_PROVIDER_SITE_OTHER): Payer: Self-pay

## 2022-04-14 ENCOUNTER — Emergency Department
Admission: EM | Admit: 2022-04-14 | Discharge: 2022-04-14 | Disposition: A | Discharge status: Home / Self Care | Payer: Medicare Other | Attending: Emergency Medicine | Admitting: Emergency Medicine

## 2022-04-14 ENCOUNTER — Encounter: Payer: Self-pay | Admitting: Emergency Medicine

## 2022-04-14 ENCOUNTER — Other Ambulatory Visit: Payer: Self-pay

## 2022-04-14 ENCOUNTER — Emergency Department: Payer: Medicare Other

## 2022-04-14 DIAGNOSIS — R350 Frequency of micturition: Secondary | ICD-10-CM | POA: Diagnosis present

## 2022-04-14 DIAGNOSIS — D72829 Elevated white blood cell count, unspecified: Secondary | ICD-10-CM | POA: Insufficient documentation

## 2022-04-14 DIAGNOSIS — K529 Noninfective gastroenteritis and colitis, unspecified: Secondary | ICD-10-CM

## 2022-04-14 DIAGNOSIS — R32 Unspecified urinary incontinence: Secondary | ICD-10-CM | POA: Insufficient documentation

## 2022-04-14 DIAGNOSIS — N390 Urinary tract infection, site not specified: Secondary | ICD-10-CM | POA: Insufficient documentation

## 2022-04-14 DIAGNOSIS — R197 Diarrhea, unspecified: Secondary | ICD-10-CM | POA: Insufficient documentation

## 2022-04-14 DIAGNOSIS — R109 Unspecified abdominal pain: Secondary | ICD-10-CM | POA: Diagnosis not present

## 2022-04-14 LAB — URINALYSIS, ROUTINE W REFLEX MICROSCOPIC
Bilirubin Urine: NEGATIVE
Glucose, UA: NEGATIVE mg/dL
Ketones, ur: NEGATIVE mg/dL
Nitrite: NEGATIVE
Protein, ur: NEGATIVE mg/dL
Specific Gravity, Urine: 1.004 — ABNORMAL LOW (ref 1.005–1.030)
WBC, UA: >50 WBC/hpf — ABNORMAL HIGH (ref 0–5)
pH: 7 (ref 5.0–8.0)

## 2022-04-14 LAB — CBC WITH DIFFERENTIAL/PLATELET
Abs Immature Granulocytes: 0.06 10*3/uL (ref 0.00–0.07)
Basophils Absolute: 0.1 10*3/uL (ref 0.0–0.1)
Basophils Relative: 0 %
Eosinophils Absolute: 0.1 10*3/uL (ref 0.0–0.5)
Eosinophils Relative: 0 %
HCT: 41.9 % (ref 36.0–46.0)
Hemoglobin: 14 g/dL (ref 12.0–15.0)
Immature Granulocytes: 0 %
Lymphocytes Relative: 42 %
Lymphs Abs: 6.7 10*3/uL — ABNORMAL HIGH (ref 0.7–4.0)
MCH: 31.4 pg (ref 26.0–34.0)
MCHC: 33.4 g/dL (ref 30.0–36.0)
MCV: 93.9 fL (ref 80.0–100.0)
Monocytes Absolute: 1 10*3/uL (ref 0.1–1.0)
Monocytes Relative: 6 %
Neutro Abs: 8 10*3/uL — ABNORMAL HIGH (ref 1.7–7.7)
Neutrophils Relative %: 52 %
Platelets: 189 10*3/uL (ref 150–400)
RBC: 4.46 MIL/uL (ref 3.87–5.11)
RDW: 13.3 % (ref 11.5–15.5)
WBC: 15.9 10*3/uL — ABNORMAL HIGH (ref 4.0–10.5)
nRBC: 0 % (ref 0.0–0.2)

## 2022-04-14 LAB — COMPREHENSIVE METABOLIC PANEL
ALT: 19 U/L (ref 0–44)
AST: 24 U/L (ref 15–41)
Albumin: 4 g/dL (ref 3.5–5.0)
Alkaline Phosphatase: 74 U/L (ref 38–126)
Anion gap: 9 (ref 5–15)
BUN: 20 mg/dL (ref 8–23)
CO2: 23 mmol/L (ref 22–32)
Calcium: 9 mg/dL (ref 8.9–10.3)
Chloride: 104 mmol/L (ref 98–111)
Creatinine, Ser: 0.82 mg/dL (ref 0.44–1.00)
GFR, Estimated: >60 mL/min (ref >60)
Glucose, Bld: 104 mg/dL — ABNORMAL HIGH (ref 70–99)
Potassium: 4.2 mmol/L (ref 3.5–5.1)
Sodium: 136 mmol/L (ref 135–145)
Total Bilirubin: 1.2 mg/dL (ref 0.3–1.2)
Total Protein: 7.1 g/dL (ref 6.5–8.1)

## 2022-04-14 MED ORDER — LEVOFLOXACIN 500 MG PO TABS: 500.0000 mg | ORAL_TABLET | Freq: Every day | ORAL | 0 refills | Status: AC

## 2022-04-14 MED ORDER — LEVOFLOXACIN 500 MG PO TABS
500.0000 mg | ORAL_TABLET | Freq: Once | ORAL | Status: AC
  Administered 2022-04-14: 500 mg via ORAL
  Filled 2022-04-14: qty 1

## 2022-04-14 MED ORDER — IOHEXOL 300 MG/ML  SOLN
100.0000 mL | Freq: Once | INTRAMUSCULAR | Status: AC | PRN
  Administered 2022-04-14: 100 mL via INTRAVENOUS

## 2022-04-14 NOTE — ED Provider Triage Note (Signed)
  Emergency Medicine Provider Triage Evaluation Note  Teresa Moyer , a 86 y.o.female,  was evaluated in triage.  Pt complains of urinary fecal incontinence x5 months.  She states that she was treated a few months back with antibiotics, which she says resolved her symptoms.  However, she states that it is returning.  Her incontinence is intermittent.  Denies saddle anesthesia.  Denies any other symptoms at this time.  Denies any recent falls or injuries.  Denies back pain.   Review of Systems  Positive: Urinary/fecal incontinence. Negative: Denies fever, chest pain, vomiting  Physical Exam   Vitals:   04/14/22 1327 04/14/22 1329  BP: (!) 177/74   Pulse: 77   Resp: 16   Temp:  98.6 F (37 C)  SpO2: 97%    Gen:   Awake, no distress   Resp:  Normal effort  MSK:   Moves extremities without difficulty  Other:    Medical Decision Making  Given the patient's initial medical screening exam, the following diagnostic evaluation has been ordered. The patient will be placed in the appropriate treatment space, once one is available, to complete the evaluation and treatment. I have discussed the plan of care with the patient and I have advised the patient that an ED physician or mid-level practitioner will reevaluate their condition after the test results have been received, as the results may give them additional insight into the type of treatment they may need.    Diagnostics: Labs, UA  Treatments: none immediately   Teodoro Spray, [place] 04/14/22 1343

## 2022-04-14 NOTE — Discharge Instructions (Signed)
Take the stool sample to [facility] Return to the ER if worsening

## 2022-04-14 NOTE — ED Provider Notes (Signed)
  Physical Exam  BP (!) 177/74 (BP Location: Left Arm)   Pulse 77   Temp 98.6 F (37 C) (Oral)   Resp 16   Ht '5\' 1"'$  (1.549 m)   Wt 65.8 kg   SpO2 97%   BMI 27.41 kg/m   Physical Exam  Procedures  Procedures  ED Course / MDM    Medical Decision Making Amount and/or Complexity of Data Reviewed Labs: ordered. Radiology: ordered.  Risk Prescription drug management.   Assuming care from Charlsie Merles, NP Patient is stable, plan at this time is to await CT of the abdomen.  Patient has had chronic incontinence with urine and bowel movements.  See original H&P for further information.   CT was independently reviewed and interpreted by me as being negative for any acute abnormality.  I did discuss this finding with the patient and her caregiver.  Gave her outpatient prescriptions for stool culture and C. difficile.  She is to collect these and deliver to [facility] testing center.  I did put a note on these prescriptions for them to notify her PCP with the results.  Patient was given 1 dose of Levaquin here in the ED for her UTI.  She will be given a prescription for 7 Levaquin pills to take daily for 7 days.  Return emergency department if worsening.  They are in agreement treatment plan.  She is discharged stable condition.      Versie Starks, PA-C 04/14/22 1647    Rada Hay, MD 04/14/22 [phone]

## 2022-04-14 NOTE — ED Triage Notes (Signed)
Pt here with urinary and fecal incontinence that started in June. Pt states her water was recently treated and she was on abx. Pt also is c/o of a UTI. Pt denies pain and N/V.

## 2022-04-14 NOTE — ED Provider Notes (Signed)
[facility] Provider Note    Event Date/Time   First MD Initiated Contact with Patient 04/14/22 1350     (approximate)   History   Urinary Incontinence   HPI  Teresa Moyer is a 86 y.o. female presents to the emergency department for treatment and evaluation of urinary and fecal incontinence. No fever. Similar symptoms in the summer. She was treated with antibiotics for infectious diarrhea and symptoms resolved. Three days ago, she again began feeling bloated and started having diarrhea and frequent urination she is unable to control.       Physical Exam   Triage Vital Signs: ED Triage Vitals  Enc Vitals Group     BP 04/14/22 1327 (!) 177/74     Pulse Rate 04/14/22 1327 77     Resp 04/14/22 1327 16     Temp 04/14/22 1329 98.6 F (37 C)     Temp Source 04/14/22 1327 Oral     SpO2 04/14/22 1327 97 %     Weight 04/14/22 1328 145 lb 1 oz (65.8 kg)     Height 04/14/22 1328 '5\' 1"'$  (1.549 m)     Head Circumference --      Peak Flow --      Pain Score 04/14/22 1328 0     Pain Loc --      Pain Edu? --      Excl. in [place]? --     Most recent vital signs: Vitals:   04/14/22 1327 04/14/22 1329  BP: (!) 177/74   Pulse: 77   Resp: 16   Temp:  98.6 F (37 C)  SpO2: 97%      General: Awake, no distress.  CV:  Good peripheral perfusion.  Resp:  Normal effort.  Abd:  No distention. Soft, nontender Other:     ED Results / Procedures / Treatments   Labs (all labs ordered are listed, but only abnormal results are displayed) Labs Reviewed  URINALYSIS, ROUTINE W REFLEX MICROSCOPIC - Abnormal; Notable for the following components:      Result Value   Color, Urine YELLOW (*)    APPearance CLOUDY (*)    Specific Gravity, Urine 1.004 (*)    Hgb urine dipstick SMALL (*)    Leukocytes,Ua LARGE (*)    WBC, UA >50 (*)    Bacteria, UA FEW (*)    All other components within normal limits  COMPREHENSIVE METABOLIC PANEL - Abnormal; Notable for the  following components:   Glucose, Bld 104 (*)    All other components within normal limits  CBC WITH DIFFERENTIAL/PLATELET - Abnormal; Notable for the following components:   WBC 15.9 (*)    Neutro Abs 8.0 (*)    Lymphs Abs 6.7 (*)    All other components within normal limits  URINE CULTURE     EKG  Not indicated.   RADIOLOGY  CT abdomen and pelvis is pending.   PROCEDURES:  Critical Care performed: No  Procedures   MEDICATIONS ORDERED IN ED: Medications  iohexol (OMNIPAQUE) 300 MG/ML solution 100 mL (100 mLs Intravenous Contrast Given 04/14/22 1522)  levofloxacin (LEVAQUIN) tablet 500 mg (500 mg Oral Given 04/14/22 1656)     IMPRESSION / MDM / ASSESSMENT AND PLAN / ED COURSE  I reviewed the triage vital signs and the nursing notes.  Differential diagnosis includes, but is not limited to infectious diarrhea, colitis, diverticulitis, acute cystitis, age related changes.  Patient's presentation is most consistent with acute illness / injury with system symptoms.  86 year old female presents to the ER for incontinence of bowel and bladder. See HPI.  Leukocytosis of 15.9 likely related to UTI. Urine shows large amount of leukocytes, WBC, bacteria and hemoglobin.   Patient and caregiver are concerned there is something more sinister going on and she would like to have a CT. She did not have imaging in the summer. It seems reasonable to get imaging. She has a normal BUN/Creatinine and no history of kidney disease.   Care relinquished to Ashok Cordia, PA-C who will follow up on CT and determine disposition and treatment.     FINAL CLINICAL IMPRESSION(S) / ED DIAGNOSES   Final diagnoses:  Acute UTI  Chronic diarrhea  Urinary incontinence, unspecified type     Rx / DC Orders   ED Discharge Orders          Ordered    Stool culture        04/14/22 1630    Clostridium difficile,EIA        04/14/22 1630    levofloxacin (LEVAQUIN)  500 MG tablet  Daily        04/14/22 1631             Note:  This document was prepared using Dragon voice recognition software and may include unintentional dictation errors.   Victorino Dike, FNP 04/15/22 Gloriajean Dell    Lavonia Drafts, MD 04/15/22 [phone]

## 2022-04-16 LAB — URINE CULTURE: Culture: >=100000 — AB

## 2022-04-17 ENCOUNTER — Other Ambulatory Visit: Payer: Self-pay

## 2022-04-17 ENCOUNTER — Telehealth: Payer: Self-pay

## 2022-04-17 NOTE — Progress Notes (Signed)
ED Antimicrobial Stewardship Positive Culture Follow Up   Teresa Moyer is an 86 y.o. female who presented to [facility] on 04/14/2022 with a chief complaint of  Chief Complaint  Patient presents with   Urinary Incontinence    Recent Results (from the past 720 hour(s))  Urine Culture     Status: Abnormal   Collection Time: 04/14/22  1:35 PM   Specimen: Urine, Clean Catch  Result Value Ref Range Status   Specimen Description   Final    URINE, CLEAN CATCH Performed at [facility], [address] [place], [place] [address]    Special Requests   Final    NONE Performed at [facility], [address], [place], [place] [address]    Culture >=100,000 COLONIES/mL ESCHERICHIA COLI (A)  Final   Report Status 04/16/2022 FINAL  Final   Organism ID, Bacteria ESCHERICHIA COLI (A)  Final      Susceptibility   Escherichia coli - MIC*    AMPICILLIN >=32 RESISTANT Resistant     CEFAZOLIN <=4 SENSITIVE Sensitive     CEFEPIME <=0.12 SENSITIVE Sensitive     CEFTRIAXONE <=0.25 SENSITIVE Sensitive     CIPROFLOXACIN >=4 RESISTANT Resistant     GENTAMICIN >=16 RESISTANT Resistant     IMIPENEM <=0.25 SENSITIVE Sensitive     NITROFURANTOIN <=16 SENSITIVE Sensitive     TRIMETH/SULFA >=320 RESISTANT Resistant     AMPICILLIN/SULBACTAM >=32 RESISTANT Resistant     PIP/TAZO <=4 SENSITIVE Sensitive     * >=100,000 COLONIES/mL ESCHERICHIA COLI    '[x]'$  Treated with levofloxacin, organism resistant to prescribed antimicrobial '[]'$  Patient discharged originally without antimicrobial agent and treatment is now indicated  New antibiotic prescription: Nitrofurantoin 100 mg PO q12H x 5 days  ED Provider: Burnis Medin, PharmD PGY-1 Pharmacy Resident 04/17/2022 3:38 PM

## 2022-04-17 NOTE — Telephone Encounter (Signed)
Copied from [place] [id number]. Topic: General - Inquiry >> Apr 17, 2022  2:52 PM Marcellus Scott wrote: Reason for CRM: Pt stated she did a stool culture at [facility] from when she was seen at [facility] ED and results will be sent to [name] pt is requesting for a callback when Ria Comment receives results.  Please advise.

## 2022-04-22 LAB — STOOL CULTURE: E coli, Shiga toxin Assay: NEGATIVE

## 2022-04-22 LAB — CLOSTRIDIUM DIFFICILE EIA: C difficile Toxins A+B, EIA: NEGATIVE

## 2022-04-24 ENCOUNTER — Ambulatory Visit (INDEPENDENT_AMBULATORY_CARE_PROVIDER_SITE_OTHER): Payer: Medicare Other | Admitting: Physician Assistant

## 2022-04-24 ENCOUNTER — Encounter: Payer: Self-pay | Admitting: Physician Assistant

## 2022-04-24 VITALS — BP 148/68 | HR 65 | Temp 98.2°F | Resp 16 | Ht 61.5 in | Wt 146.0 lb

## 2022-04-24 DIAGNOSIS — R32 Unspecified urinary incontinence: Secondary | ICD-10-CM

## 2022-04-24 DIAGNOSIS — R159 Full incontinence of feces: Secondary | ICD-10-CM

## 2022-04-24 LAB — POCT URINALYSIS DIPSTICK
Bilirubin, UA: NEGATIVE
Blood, UA: NEGATIVE
Glucose, UA: NEGATIVE
Ketones, UA: NEGATIVE
Leukocytes, UA: NEGATIVE
Nitrite, UA: NEGATIVE
Protein, UA: NEGATIVE
Spec Grav, UA: 1.01 (ref 1.010–1.025)
Urobilinogen, UA: 0.2 E.U./dL
pH, UA: 6.5 (ref 5.0–8.0)

## 2022-04-24 NOTE — Progress Notes (Signed)
I,Joseline E Rosas,acting as a scribe for [employer], PA-C.,have documented all relevant documentation on the behalf of [name], PA-C,as directed by  [name], PA-C while in the presence of [name], PA-C.   Established patient visit   Patient: Teresa Moyer   DOB: [birth year]   86 y.o. Female  MRN: [id number] Visit Date: 04/24/2022  Today's healthcare provider: [name], PA-C   Chief Complaint  Patient presents with   Follow-up   Subjective    HPI   Sherron is an 86 y/o female who was seen for fecal and urinary incontinence 10/31 in the ED. Treated with levaquin for a UTI. Reports overall feeling better, than her stool incontinence improved on the antibiotic, but returned when she finished. She does not have an appt with GI until 1/24. She takes two probiotics daily and eats yogurt.   She had an abdominal CT in the ED which commented on pelvic floor laxity.  ----------------------------------------------------------------------------------------- -   Medications: Outpatient Medications Prior to Visit  Medication Sig   acetaminophen (TYLENOL) 650 MG CR tablet Take by mouth.   alendronate (FOSAMAX) 70 MG tablet Take 1 tablet (70 mg total) by mouth every 7 (seven) days. Take with a full glass of water on an empty stomach.   atorvastatin (LIPITOR) 40 MG tablet Take 1 tablet (40 mg total) by mouth daily at 6 PM.   azelastine (ASTELIN) 0.1 % nasal spray Place 1 spray into both nostrils 2 (two) times daily.   Carboxymethylcell-Hypromellose (GENTEAL) 0.25-0.3 % GEL Apply 1 application to eye at bedtime.    carvedilol (COREG) 12.5 MG tablet Take 12.5 mg by mouth 2 (two) times daily with a meal.    cetirizine (ZYRTEC) 10 MG tablet Take 1 tablet (10 mg total) by mouth at bedtime.   fluticasone (FLONASE) 50 MCG/ACT nasal spray Place 1 spray into both nostrils daily.    furosemide (LASIX) 40 MG tablet TAKE 1 TABLET BY MOUTH TWICE DAILY   hydrOXYzine  (VISTARIL) 25 MG capsule Take 1 capsule (25 mg total) by mouth 3 (three) times daily as needed.   levofloxacin (LEVAQUIN) 500 MG tablet Take 1 tablet (500 mg total) by mouth daily for 10 days.   metolazone (ZAROXOLYN) 2.5 MG tablet Take 1 tablet (2.5 mg total) by mouth every other day as needed (Increased swelling, shortness of breath or over 2 pound weight gain).   Multiple Vitamins-Minerals (PRESERVISION AREDS PO) Take 1 tablet by mouth 2 (two) times daily.    omeprazole (PRILOSEC) 20 MG capsule TAKE 1 CAPSULE BY MOUTH ONCE DAILY   polyethylene glycol powder (MIRALAX) 17 GM/SCOOP powder Take 17 g by mouth 2 (two) times daily as needed for moderate constipation.   potassium chloride (KLOR-CON) 10 MEQ tablet Take 2 tablets by mouth daily.   Probiotic Product (ALIGN) 4 MG CAPS Take 8 mg by mouth daily after lunch.   sacubitril-valsartan (ENTRESTO) 49-51 MG Take 1 tablet by mouth 2 (two) times daily.   senna-docusate (SENOKOT-S) 8.6-50 MG tablet Take 1 tablet by mouth 2 (two) times daily as needed for moderate constipation.   traMADol (ULTRAM) 50 MG tablet TAKE 1/2 TABLET BY MOUTH EVERY 4 TO 6 HOURS AS NEEDED  FOR MODERATE PAIN   acetaminophen (TYLENOL) 325 MG tablet Take 650 mg by mouth every 6 (six) hours as needed. (Patient not taking: Reported on 03/11/2022)   metroNIDAZOLE (FLAGYL) 500 MG tablet Take 1 tablet (500 mg total) by mouth 3 (three) times daily. (Patient not  taking: Reported on 04/24/2022)   potassium chloride (KLOR-CON) 10 MEQ tablet TAKE 1 TABLET BY MOUTH TWICE DAILY   No facility-administered medications prior to visit.    Review of Systems  Constitutional:  Negative for fatigue and fever.  Respiratory:  Negative for cough and shortness of breath.   Cardiovascular:  Negative for chest pain and leg swelling.  Gastrointestinal:  Negative for abdominal pain.  Neurological:  Negative for dizziness and headaches.      Objective    BP (!) 148/68 (BP Location: Left Arm, Patient  Position: Sitting, Cuff Size: Normal)   Pulse 65   Temp 98.2 F (36.8 C) (Oral)   Resp 16   Ht 5' 1.5" (1.562 m)   Wt 146 lb (66.2 kg)   SpO2 97%   BMI 27.14 kg/m   Physical Exam Constitutional:      General: She is awake.     Appearance: She is well-developed.  HENT:     Head: Normocephalic.  Eyes:     Conjunctiva/sclera: Conjunctivae normal.  Cardiovascular:     Rate and Rhythm: Normal rate and regular rhythm.     Heart sounds: Normal heart sounds.  Pulmonary:     Effort: Pulmonary effort is normal.     Breath sounds: Normal breath sounds.  Skin:    General: Skin is warm.  Neurological:     Mental Status: She is alert and oriented to person, place, and time.  Psychiatric:        Attention and Perception: Attention normal.        Mood and Affect: Mood normal.        Speech: Speech normal.        Behavior: Behavior is cooperative.      Results for orders placed or performed in visit on 04/24/22  POCT Urinalysis Dipstick  Result Value Ref Range   Color, UA Yellow    Clarity, UA Clear    Glucose, UA Negative Negative   Bilirubin, UA Negative    Ketones, UA Negative    Spec Grav, UA 1.010 1.010 - 1.025   Blood, UA Negative    pH, UA 6.5 5.0 - 8.0   Protein, UA Negative Negative   Urobilinogen, UA 0.2 0.2 or 1.0 E.U./dL   Nitrite, UA Negative    Leukocytes, UA Negative Negative    Assessment & Plan     Problem List Items Addressed This Visit       Other   Incontinence of feces - Primary    Pt has appt w/ GI 1/24 will attempt to expedite.  Advised continue probiotic and add fiber supplement to diet.  Pelvic floor dysfunction likely contributes Stool cultures still pending      Urinary incontinence    Repeated UA per pt preference, negative no additional culture indicated Discussed pelvic floor dysfunction and how it contributes to incontinence. She may benefit from a uro/gyn referral. She will consider      Relevant Orders   POCT Urinalysis  Dipstick (Completed)     Return if symptoms worsen or fail to improve.      I, [name], PA-C have reviewed all documentation for this visit. The documentation on  04/24/2022  for the exam, diagnosis, procedures, and orders are all accurate and complete.  [name], PA-C [facility] [address] [place], [place], [address] Office: [phone] Fax: [place]

## 2022-04-24 NOTE — Assessment & Plan Note (Addendum)
Repeated UA per pt preference, negative no additional culture indicated Discussed pelvic floor dysfunction and how it contributes to incontinence. She may benefit from a uro/gyn referral. She will consider

## 2022-04-24 NOTE — Assessment & Plan Note (Addendum)
Pt has appt w/ GI 1/24 will attempt to expedite.  Advised continue probiotic and add fiber supplement to diet.  Pelvic floor dysfunction likely contributes Stool cultures still pending

## 2022-04-28 ENCOUNTER — Telehealth: Payer: Self-pay

## 2022-04-28 NOTE — Telephone Encounter (Signed)
Copied from [place] [id number]. Topic: General - Inquiry >> Apr 28, 2022  4:09 PM Erskine Squibb wrote: Reason for CRM: The patient called in to check on the status of her urinalysis as well as stool sample she left. Please assist patient further

## 2022-04-29 ENCOUNTER — Inpatient Hospital Stay: Payer: Medicare Other | Admitting: Physician Assistant

## 2022-04-29 NOTE — Telephone Encounter (Signed)
Patient advised UA was clear. Asked patient is she had submitted stool and she says yes it was turned into [facility] on [address] A week ago on Friday. Advised I would contact [facility] and check into it

## 2022-05-01 ENCOUNTER — Other Ambulatory Visit: Payer: Self-pay | Admitting: Physician Assistant

## 2022-05-01 DIAGNOSIS — R159 Full incontinence of feces: Secondary | ICD-10-CM

## 2022-05-01 DIAGNOSIS — R32 Unspecified urinary incontinence: Secondary | ICD-10-CM

## 2022-05-01 NOTE — Telephone Encounter (Signed)
Patient called back in checking on the status of her stool culture she had back on 10/31/ Please assist further

## 2022-06-01 ENCOUNTER — Telehealth: Payer: Self-pay | Admitting: Family Medicine

## 2022-06-01 DIAGNOSIS — K219 Gastro-esophageal reflux disease without esophagitis: Secondary | ICD-10-CM

## 2022-06-01 MED ORDER — OMEPRAZOLE 20 MG PO CPDR: 20.0000 mg | DELAYED_RELEASE_CAPSULE | Freq: Every day | ORAL | 2 refills | Status: DC

## 2022-06-01 NOTE — Telephone Encounter (Signed)
[facility] pharmacy faxed refill request for the following medications:    omeprazole (PRILOSEC) 20 MG capsule    Please advise

## 2022-06-16 ENCOUNTER — Telehealth: Payer: Self-pay | Admitting: Family Medicine

## 2022-06-16 DIAGNOSIS — M1990 Unspecified osteoarthritis, unspecified site: Secondary | ICD-10-CM

## 2022-06-16 NOTE — Telephone Encounter (Signed)
[facility] faxed refill request for the following medications:    alendronate (FOSAMAX) 70 MG tablet    Please advise

## 2022-06-17 NOTE — Telephone Encounter (Signed)
Requesting too soon [facility] 10/09/21 #[phone] [id number]

## 2022-06-24 ENCOUNTER — Encounter: Payer: Self-pay | Admitting: Gastroenterology

## 2022-06-24 ENCOUNTER — Ambulatory Visit (INDEPENDENT_AMBULATORY_CARE_PROVIDER_SITE_OTHER): Payer: Medicare Other | Admitting: Gastroenterology

## 2022-06-24 ENCOUNTER — Other Ambulatory Visit: Payer: Self-pay

## 2022-06-24 VITALS — BP 143/72 | HR 63 | Temp 97.9°F | Ht 61.5 in | Wt 146.5 lb

## 2022-06-24 DIAGNOSIS — R140 Abdominal distension (gaseous): Secondary | ICD-10-CM | POA: Diagnosis not present

## 2022-06-24 DIAGNOSIS — R151 Fecal smearing: Secondary | ICD-10-CM
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
## 2022-06-25 LAB — H. PYLORI BREATH TEST: H pylori Breath Test: NEGATIVE

## 2022-06-26 ENCOUNTER — Telehealth: Payer: Self-pay

## 2022-06-26 DIAGNOSIS — R151 Fecal smearing: Secondary | ICD-10-CM

## 2022-06-26 DIAGNOSIS — R140 Abdominal distension (gaseous): Secondary | ICD-10-CM

## 2022-06-26 NOTE — Telephone Encounter (Signed)
Patient verbalized understanding of results. Patient will go today to pick up the stool kit

## 2022-06-26 NOTE — Telephone Encounter (Signed)
-----  Message from Lin Landsman, MD sent at 06/25/2022  4:46 PM EST ----- Please inform patient that the H. pylori breath test came back negative for any infection in her stomach.  Recommend GI profile PCR  RV

## 2022-07-04 LAB — GI PROFILE, STOOL, PCR

## 2022-07-06 IMAGING — CT CT ABD-PELV W/ CM
2 of 5 series · 16 of 46 positions shown, 18 images · IV contrast (APPLIED)
Comparison: CT abdomen pelvis dated 01/08/2020.

CLINICAL DATA: 84-year-old female with abdominal pain.

EXAM:
CT ABDOMEN AND PELVIS WITH CONTRAST
TECHNIQUE: Multidetector CT imaging of the abdomen and pelvis was performed
using the standard protocol following bolus administration of
intravenous contrast.
CONTRAST:  100mL OMNIPAQUE IOHEXOL 300 MG/ML  SOLN

[Series 2: routine abd/pel with · axial · 0.76mm/px · z∈[-199,+161]mm · 13 of 82 slices shown, 15 images]
[im 5/82  soft-tissue]
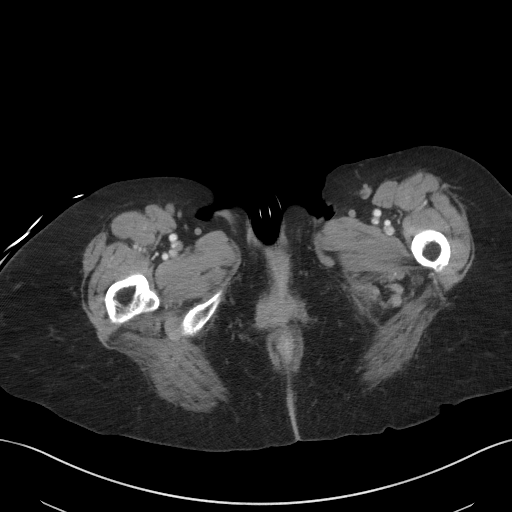
[im 5/82  bone]
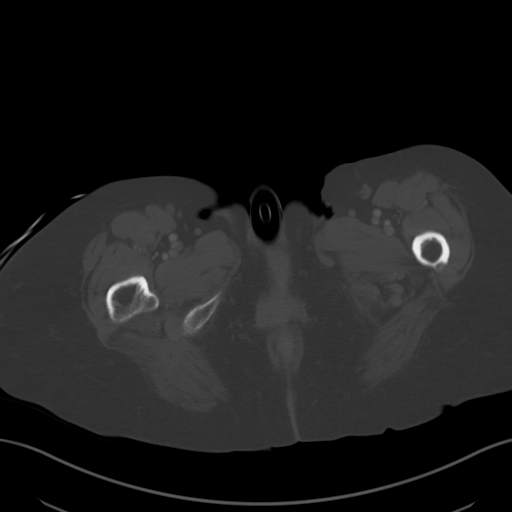
[im 13/82  soft-tissue]
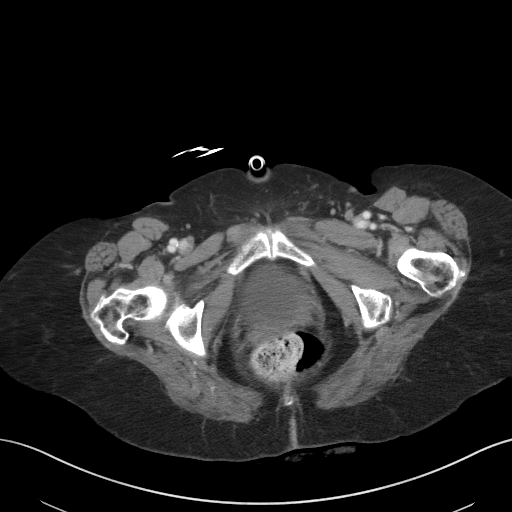
[im 18/82  soft-tissue]
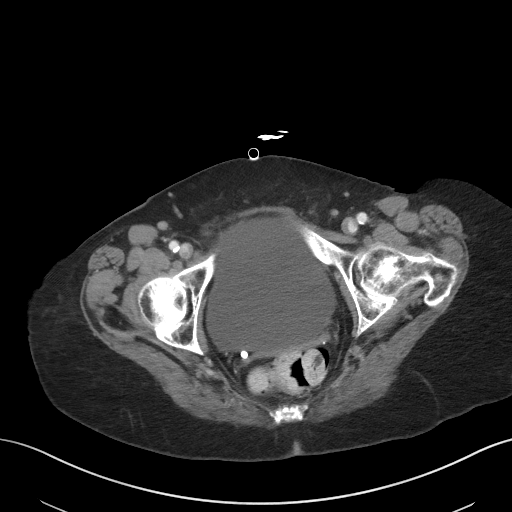
[im 22/82  soft-tissue]
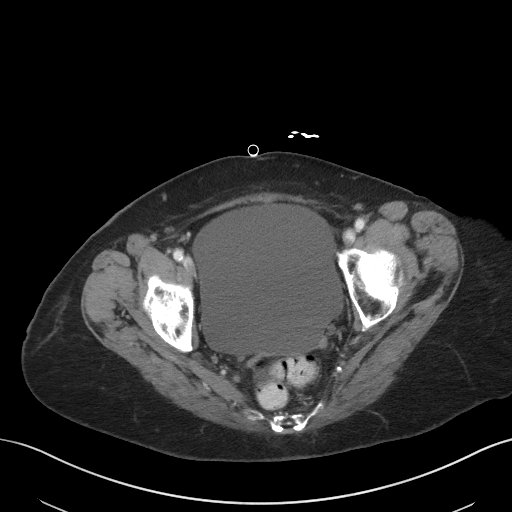
[im 30/82  soft-tissue]
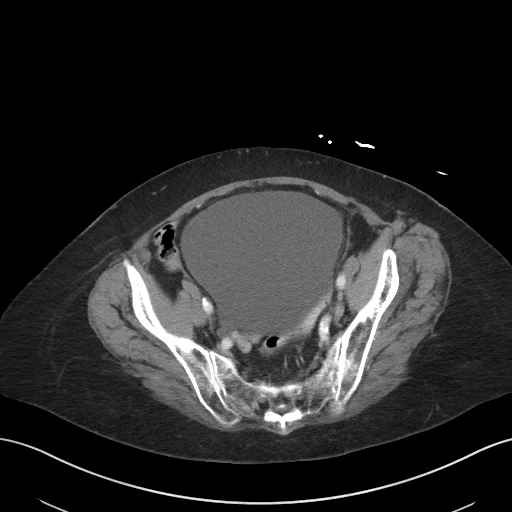
[im 35/82  soft-tissue]
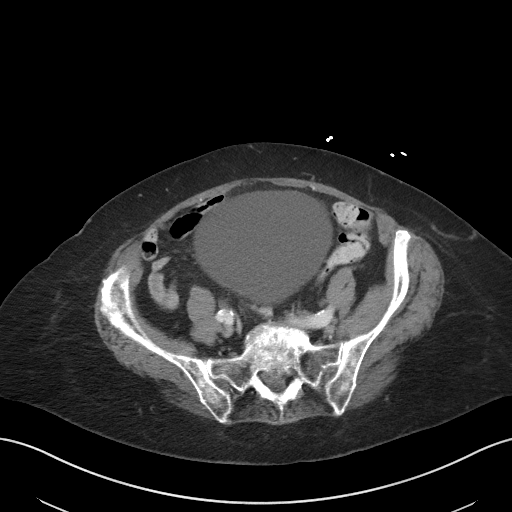
[im 43/82  soft-tissue]
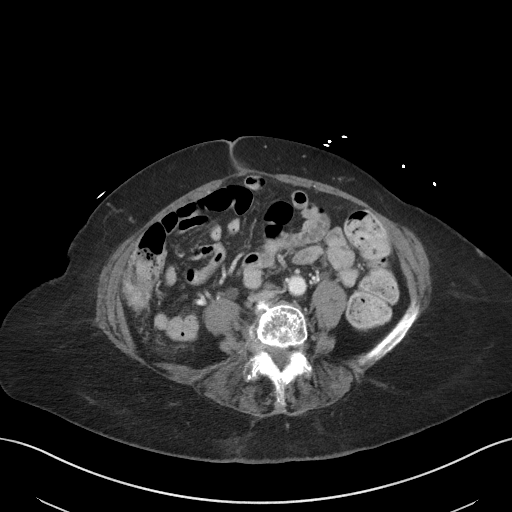
[im 47/82  soft-tissue]
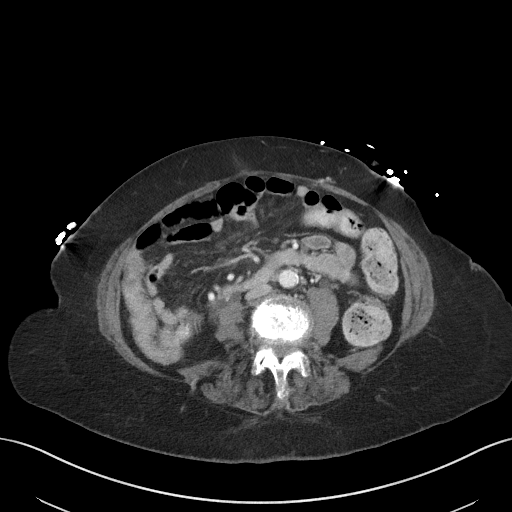
[im 52/82  soft-tissue]
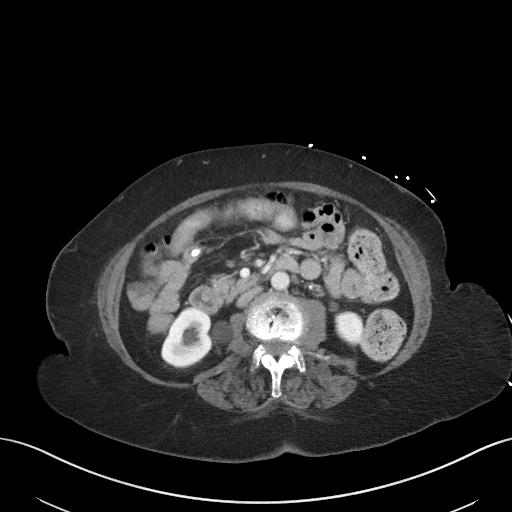
[im 52/82  bone]
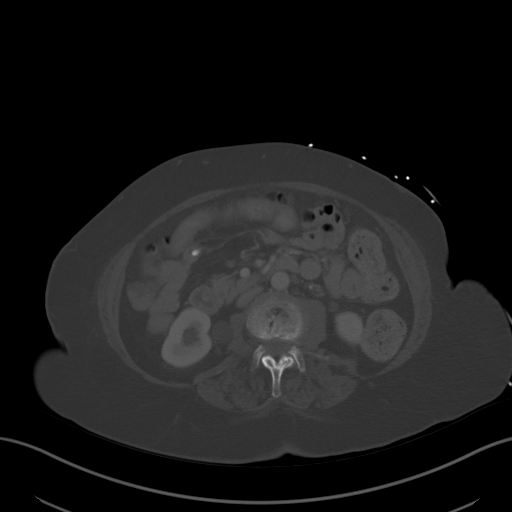
[im 60/82  soft-tissue]
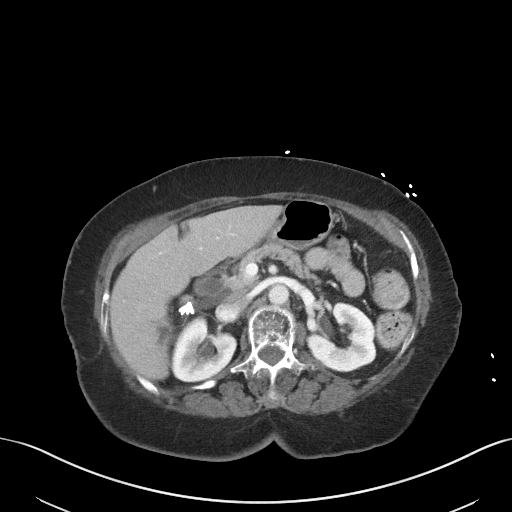
[im 64/82  soft-tissue]
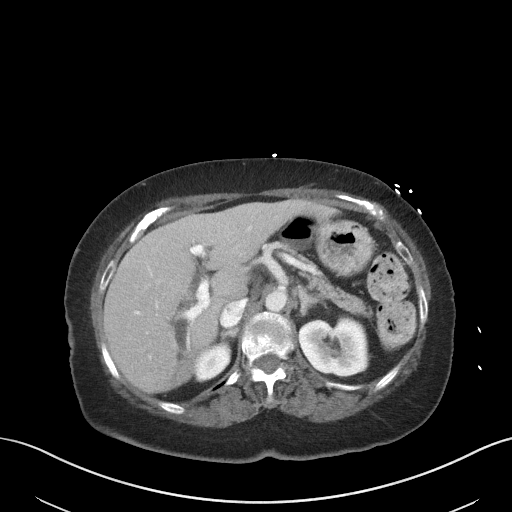
[im 69/82  soft-tissue]
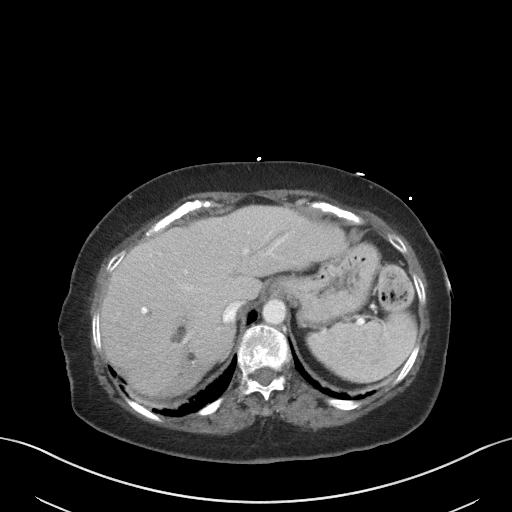
[im 77/82  soft-tissue]
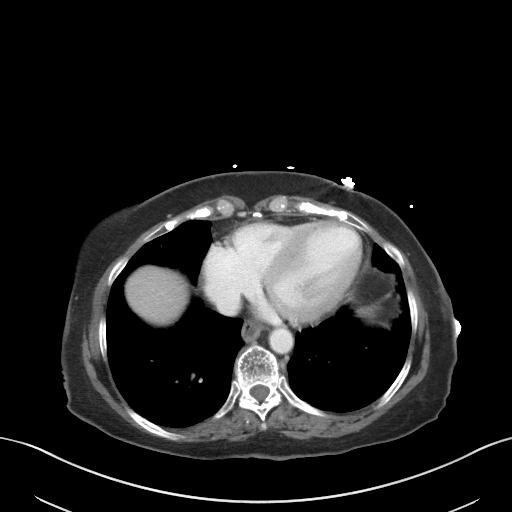

[Series 5: coronal st · coronal · 0.68mm/px · 3 of 89 slices shown]
[im 30/89  soft-tissue]
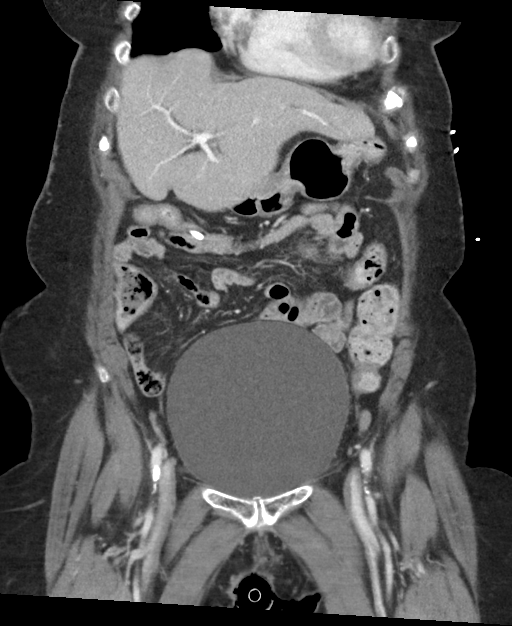
[im 40/89  soft-tissue]
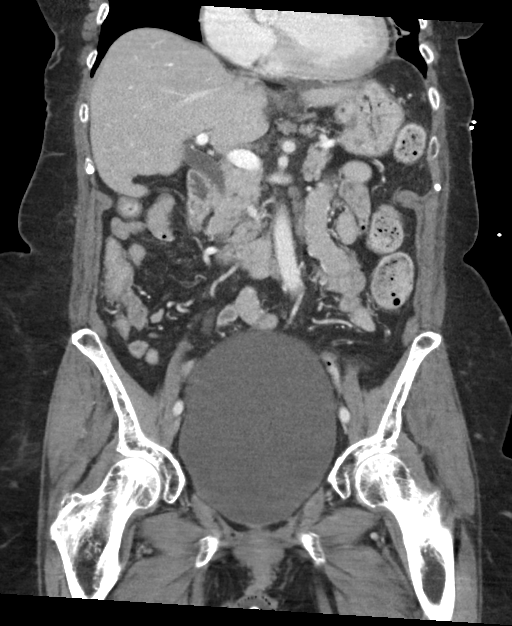
[im 49/89  soft-tissue]
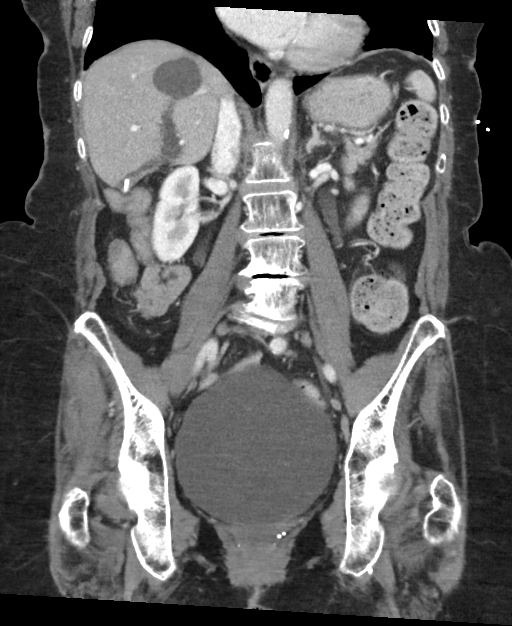

[16 of 46 positions shown; findings below may reference images not displayed]

FINDINGS: Lower chest: The visualized lung bases are clear. There is mild
cardiomegaly.

No intra-abdominal free air or free fluid.

Hepatobiliary: Cirrhosis. There is a 3.5 cm cyst in the dome of the
liver. Dilatation of the intrahepatic and extrahepatic biliary tree
similar to prior CT. Cholecystectomy. No retained calcified stone
noted the central CBD.

Pancreas: The a 1 cm low attenuating, possibly cystic lesion along
the inferior aspect of the uncinate process of the pancreas (coronal
39/5 and axial 32/2), not characterized, possibly a side branch
IPMN. This is unchanged compared to the the study of 01/04/2020.
Attention on follow-up imaging or further characterization with MRI
is recommended. No dilatation of the main pancreatic duct or gland
atrophy. No active inflammatory changes.

Spleen: Normal in size without focal abnormality.

Adrenals/Urinary Tract: The adrenal glands unremarkable. Mild
bilateral hydronephrosis and hydroureter, likely related to bladder
distension and reflux. There is symmetric enhancement and excretion
of contrast by both kidneys. The urinary bladder is distended
otherwise grossly unremarkable.

Stomach/Bowel: There is no bowel obstruction or active inflammation.
The appendix is normal.

Vascular/Lymphatic: Mild aortoiliac atherosclerotic disease. The IVC
is unremarkable. No portal venous gas. There is no adenopathy.

Reproductive: Hysterectomy.  No adnexal masses.

Other: Small fat containing umbilical hernia.

Musculoskeletal: Osteopenia with degenerative changes of the spine.
No acute osseous pathology.
IMPRESSION: 1. No acute intra-abdominal or pelvic pathology. No bowel
obstruction. Normal appendix.
2. Mild bilateral hydronephrosis and hydroureter, likely related to
bladder distension and reflux.
3. Cirrhosis.
4. Aortic Atherosclerosis ([id number]-78R.R).

## 2022-07-09 ENCOUNTER — Other Ambulatory Visit: Payer: Self-pay

## 2022-07-09 ENCOUNTER — Telehealth: Payer: Self-pay

## 2022-07-09 DIAGNOSIS — R140 Abdominal distension (gaseous): Secondary | ICD-10-CM

## 2022-07-09 NOTE — Telephone Encounter (Signed)
Patient verbalized understanding of results. She states her symptoms are doing somewhat better but she wants to get it schedule for 07/23/2022. Went over instructions with patient, mailed them and sent to [facility].

## 2022-07-09 NOTE — Telephone Encounter (Signed)
-----  Message from Lin Landsman, MD sent at 07/09/2022 11:42 AM EST ----- Please inform patient that the stool studies came back negative for infection.  If patient is still having the same issues, recommend flexible sigmoidoscopy as discussed during office visit  Rohini Vanga

## 2022-07-13 ENCOUNTER — Ambulatory Visit (INDEPENDENT_AMBULATORY_CARE_PROVIDER_SITE_OTHER): Payer: Medicare Other | Admitting: Physician Assistant

## 2022-07-13 ENCOUNTER — Encounter: Payer: Self-pay | Admitting: Physician Assistant

## 2022-07-13 VITALS — BP 125/57 | HR 57 | Temp 98.1°F | Resp 16 | Wt 149.4 lb

## 2022-07-13 DIAGNOSIS — R7303 Prediabetes: Secondary | ICD-10-CM

## 2022-07-13 DIAGNOSIS — I10 Essential (primary) hypertension: Secondary | ICD-10-CM

## 2022-07-13 LAB — POCT GLYCOSYLATED HEMOGLOBIN (HGB A1C): Hemoglobin A1C: 6.1 % — AB (ref 4.0–5.6)

## 2022-07-13 NOTE — Assessment & Plan Note (Signed)
Stable today at 6.1% Reviewed her diet, could be slightly less carb heavy but otherwise encouraged movement/exercise

## 2022-07-13 NOTE — Assessment & Plan Note (Signed)
Appropriate range today denies orthostatic symptoms F/b cardiology

## 2022-07-13 NOTE — Progress Notes (Signed)
I,Sulibeya S Dimas,acting as a Education administrator for [employer], PA-C.,have documented all relevant documentation on the behalf of [name], PA-C,as directed by  [name], PA-C while in the presence of [name], PA-C.     Established patient visit   Patient: Teresa Moyer   DOB: 11/11/1935   87 y.o. Female  MRN: [id number] Visit Date: 07/13/2022  Today's healthcare provider: [name], PA-C   Chief Complaint  Patient presents with   Hypertension   Hyperglycemia   Subjective    HPI  Hypertension, follow-up  BP Readings from Last 3 Encounters:  07/13/22 (!) 125/57  06/24/22 (!) 143/72  04/24/22 (!) 148/68   Wt Readings from Last 3 Encounters:  07/13/22 149 lb 6.4 oz (67.8 kg)  06/24/22 146 lb 8 oz (66.5 kg)  04/24/22 146 lb (66.2 kg)     She was last seen for hypertension 4 months ago.  BP at that visit was 136/68. Management since that visit includes no changes.  She reports excellent compliance with treatment. She is not having side effects.   Outside blood pressures are stable.   Pertinent labs Lab Results  Component Value Date   CHOL 111 09/15/2021   HDL 68 09/15/2021   LDLCALC 25 09/15/2021   TRIG 93 09/15/2021   CHOLHDL 1.6 09/15/2021   Lab Results  Component Value Date   NA 136 04/14/2022   K 4.2 04/14/2022   CREATININE 0.82 04/14/2022   GFRNONAA >60 04/14/2022   GLUCOSE 104 (H) 04/14/2022   TSH 2.050 03/11/2022     The ASCVD Risk score (Arnett DK, et al., 2019) failed to calculate for the following reasons:   The 2019 ASCVD risk score is only valid for ages 54 to 31   The patient has a prior MI or stroke diagnosis  --------------------------------------------------------------------------------------------------- Prediabetes, Follow-up  Lab Results  Component Value Date   HGBA1C 6.1 (A) 07/13/2022   HGBA1C 6.1 (H) 03/11/2022   HGBA1C 5.8 (H) 12/28/2018   GLUCOSE 104 (H) 04/14/2022   GLUCOSE 155 (H) 02/09/2022   GLUCOSE  101 (H) 01/07/2022    Last seen for for this4 months ago.  Management since that visit includes no changes. Current symptoms include none and have been stable.  Pertinent Labs:    Component Value Date/Time   CHOL 111 09/15/2021 0927   TRIG 93 09/15/2021 0927   CHOLHDL 1.6 09/15/2021 0927   CREATININE 0.82 04/14/2022 1335    Wt Readings from Last 3 Encounters:  07/13/22 149 lb 6.4 oz (67.8 kg)  06/24/22 146 lb 8 oz (66.5 kg)  04/24/22 146 lb (66.2 kg)    -----------------------------------------------------------------------------------------   Medications: Outpatient Medications Prior to Visit  Medication Sig   acetaminophen (TYLENOL) 650 MG CR tablet Take by mouth.   alendronate (FOSAMAX) 70 MG tablet Take 1 tablet (70 mg total) by mouth every 7 (seven) days. Take with a full glass of water on an empty stomach.   aspirin 81 MG chewable tablet Chew 81 mg by mouth daily.   atorvastatin (LIPITOR) 40 MG tablet Take 1 tablet (40 mg total) by mouth daily at 6 PM.   azelastine (ASTELIN) 0.1 % nasal spray Place 1 spray into both nostrils 2 (two) times daily.   Carboxymethylcell-Hypromellose (GENTEAL) 0.25-0.3 % GEL Apply 1 application to eye at bedtime.    carvedilol (COREG) 12.5 MG tablet Take 12.5 mg by mouth 2 (two) times daily with a meal.    cetirizine (ZYRTEC) 10 MG tablet Take 1 tablet (  10 mg total) by mouth at bedtime.   fluticasone (FLONASE) 50 MCG/ACT nasal spray Place 1 spray into both nostrils daily.    furosemide (LASIX) 40 MG tablet TAKE 1 TABLET BY MOUTH TWICE DAILY   hydrOXYzine (VISTARIL) 25 MG capsule Take 1 capsule (25 mg total) by mouth 3 (three) times daily as needed.   metolazone (ZAROXOLYN) 2.5 MG tablet Take 1 tablet (2.5 mg total) by mouth every other day as needed (Increased swelling, shortness of breath or over 2 pound weight gain).   Multiple Vitamins-Minerals (PRESERVISION AREDS PO) Take 1 tablet by mouth 2 (two) times daily.    omeprazole (PRILOSEC) 20  MG capsule Take 1 capsule (20 mg total) by mouth daily.   potassium chloride (KLOR-CON) 10 MEQ tablet TAKE 1 TABLET BY MOUTH TWICE DAILY   Probiotic Product (ALIGN) 4 MG CAPS Take 8 mg by mouth daily after lunch.   sacubitril-valsartan (ENTRESTO) 49-51 MG Take 1 tablet by mouth 2 (two) times daily.   senna-docusate (SENOKOT-S) 8.6-50 MG tablet Take 1 tablet by mouth 2 (two) times daily as needed for moderate constipation.   No facility-administered medications prior to visit.    Review of Systems  Constitutional:  Negative for appetite change and fatigue.  Eyes:  Negative for visual disturbance.  Respiratory:  Negative for cough, chest tightness and shortness of breath.   Cardiovascular:  Negative for chest pain and leg swelling.  Gastrointestinal:  Negative for abdominal pain, nausea and vomiting.  Neurological:  Negative for dizziness, light-headedness and headaches.       Objective  Blood pressure (!) 125/57, pulse (!) 57, temperature 98.1 F (36.7 C), temperature source Temporal, resp. rate 16, weight 149 lb 6.4 oz (67.8 kg), SpO2 98 %.   Physical Exam Vitals reviewed.  Constitutional:      Appearance: She is not ill-appearing.  HENT:     Head: Normocephalic.  Eyes:     Conjunctiva/sclera: Conjunctivae normal.  Cardiovascular:     Rate and Rhythm: Normal rate.  Pulmonary:     Effort: Pulmonary effort is normal. No respiratory distress.  Neurological:     General: No focal deficit present.     Mental Status: She is alert and oriented to person, place, and time.  Psychiatric:        Mood and Affect: Mood normal.        Behavior: Behavior normal.      Results for orders placed or performed in visit on 07/13/22  POCT glycosylated hemoglobin (Hb A1C)  Result Value Ref Range   Hemoglobin A1C 6.1 (A) 4.0 - 5.6 %    Assessment & Plan     Problem List Items Addressed This Visit       Cardiovascular and Mediastinum   Hypertension - Primary    Appropriate range today  denies orthostatic symptoms F/b cardiology        Other   Prediabetes    Stable today at 6.1% Reviewed her diet, could be slightly less carb heavy but otherwise encouraged movement/exercise      Relevant Orders   POCT glycosylated hemoglobin (Hb A1C) (Completed)     Return in about 6 months (around 01/11/2023) for chronic conditions.      I, [name], PA-C have reviewed all documentation for this visit. The documentation on  07/13/22  for the exam, diagnosis, procedures, and orders are all accurate and complete.  [name], PA-C [facility] [address] [place], [place], [address] Office: [phone] Fax: [phone]  [place]

## 2022-07-20 ENCOUNTER — Other Ambulatory Visit: Payer: Self-pay | Admitting: Physician Assistant

## 2022-07-20 DIAGNOSIS — Z1231 Encounter for screening mammogram for malignant neoplasm of breast: Secondary | ICD-10-CM

## 2022-07-22 ENCOUNTER — Encounter: Payer: Self-pay | Admitting: Physician Assistant

## 2022-07-22 ENCOUNTER — Ambulatory Visit (INDEPENDENT_AMBULATORY_CARE_PROVIDER_SITE_OTHER): Payer: Medicare Other | Admitting: Physician Assistant

## 2022-07-22 VITALS — BP 139/63 | HR 64 | Wt 148.0 lb

## 2022-07-22 DIAGNOSIS — N3001 Acute cystitis with hematuria: Secondary | ICD-10-CM | POA: Diagnosis not present

## 2022-07-22 DIAGNOSIS — R300 Dysuria: Secondary | ICD-10-CM | POA: Diagnosis not present

## 2022-07-22 LAB — POCT URINALYSIS DIPSTICK
Bilirubin, UA: NEGATIVE
Glucose, UA: NEGATIVE
Ketones, UA: NEGATIVE
Nitrite, UA: NEGATIVE
Protein, UA: NEGATIVE
Spec Grav, UA: <=1.005 — AB (ref 1.010–1.025)
Urobilinogen, UA: 0.2 E.U./dL
pH, UA: 7 (ref 5.0–8.0)

## 2022-07-22 MED ORDER — CIPROFLOXACIN HCL 250 MG PO TABS: 250.0000 mg | ORAL_TABLET | Freq: Two times a day (BID) | ORAL | 0 refills | Status: AC

## 2022-07-22 NOTE — Progress Notes (Signed)
I,Sha'taria Tyson,acting as a Education administrator for [employer], PA-C.,have documented all relevant documentation on the behalf of Teresa Kirschner, PA-C,as directed by  Teresa Kirschner, PA-C while in the presence of Teresa Kirschner, PA-C.   Established patient visit   Patient: Teresa Moyer   DOB: 02-05-1936   87 y.o. Female  MRN: [id number] Visit Date: 07/22/2022  Today's healthcare provider: [name], PA-C   Cc. Dysuria, lower abdominal pain x 2 days   Subjective    HPI   Urinary symptoms  She reports new onset flank pain and burning sensation when urinating . The current episode started a few days ago and is gradually worsening. Patient states symptoms are 5/10 in intensity, occurring constantly. Pt reports some lower abdominal pressure last night. Denies back pain, hematuria, body aches, fever.   Associated symptoms: Yes abdominal pain No back pain  No chills No constipation  Yes cramping No diarrhea  No discharge No fever  No hematuria No nausea  No vomiting    ---------------------------------------------------------------------------------------   Medications: Outpatient Medications Prior to Visit  Medication Sig   acetaminophen (TYLENOL) 650 MG CR tablet Take by mouth.   alendronate (FOSAMAX) 70 MG tablet Take 1 tablet (70 mg total) by mouth every 7 (seven) days. Take with a full glass of water on an empty stomach.   aspirin 81 MG chewable tablet Chew 81 mg by mouth daily.   atorvastatin (LIPITOR) 40 MG tablet Take 1 tablet (40 mg total) by mouth daily at 6 PM.   azelastine (ASTELIN) 0.1 % nasal spray Place 1 spray into both nostrils 2 (two) times daily.   Carboxymethylcell-Hypromellose (GENTEAL) 0.25-0.3 % GEL Apply 1 application to eye at bedtime.    carvedilol (COREG) 12.5 MG tablet Take 12.5 mg by mouth 2 (two) times daily with a meal.    cetirizine (ZYRTEC) 10 MG tablet Take 1 tablet (10 mg total) by mouth at bedtime.   fluticasone (FLONASE) 50 MCG/ACT nasal  spray Place 1 spray into both nostrils daily.    furosemide (LASIX) 40 MG tablet TAKE 1 TABLET BY MOUTH TWICE DAILY   hydrOXYzine (VISTARIL) 25 MG capsule Take 1 capsule (25 mg total) by mouth 3 (three) times daily as needed.   metolazone (ZAROXOLYN) 2.5 MG tablet Take 1 tablet (2.5 mg total) by mouth every other day as needed (Increased swelling, shortness of breath or over 2 pound weight gain).   Multiple Vitamins-Minerals (PRESERVISION AREDS PO) Take 1 tablet by mouth 2 (two) times daily.    omeprazole (PRILOSEC) 20 MG capsule Take 1 capsule (20 mg total) by mouth daily.   potassium chloride (KLOR-CON) 10 MEQ tablet TAKE 1 TABLET BY MOUTH TWICE DAILY   Probiotic Product (ALIGN) 4 MG CAPS Take 8 mg by mouth daily after lunch.   sacubitril-valsartan (ENTRESTO) 49-51 MG Take 1 tablet by mouth 2 (two) times daily.   senna-docusate (SENOKOT-S) 8.6-50 MG tablet Take 1 tablet by mouth 2 (two) times daily as needed for moderate constipation.   No facility-administered medications prior to visit.    Review of Systems  Constitutional:  Negative for fatigue and fever.  Respiratory:  Negative for cough and shortness of breath.   Cardiovascular:  Negative for chest pain and leg swelling.  Gastrointestinal:  Positive for abdominal pain.  Genitourinary:  Positive for frequency and urgency. Negative for hematuria.  Neurological:  Negative for dizziness and headaches.      Objective    Blood pressure 139/63, pulse 64, weight 148 lb (  67.1 kg), SpO2 100 %.   Physical Exam Vitals reviewed.  Constitutional:      Appearance: She is not ill-appearing.  HENT:     Head: Normocephalic.  Eyes:     Conjunctiva/sclera: Conjunctivae normal.  Cardiovascular:     Rate and Rhythm: Normal rate.  Pulmonary:     Effort: Pulmonary effort is normal. No respiratory distress.  Neurological:     General: No focal deficit present.     Mental Status: She is alert and oriented to person, place, and time.   Psychiatric:        Mood and Affect: Mood normal.        Behavior: Behavior normal.     Results for orders placed or performed in visit on 07/22/22  POCT Urinalysis Dipstick  Result Value Ref Range   Color, UA     Clarity, UA     Glucose, UA Negative Negative   Bilirubin, UA negative    Ketones, UA negative    Spec Grav, UA <=1.005 (A) 1.010 - 1.025   Blood, UA Large    pH, UA 7.0 5.0 - 8.0   Protein, UA Negative Negative   Urobilinogen, UA 0.2 0.2 or 1.0 E.U./dL   Nitrite, UA negative    Leukocytes, UA Moderate (2+) (A) Negative   Appearance     Odor      Assessment & Plan     Acute cystitis POC UA + leuk, blood Sent for culture Rx cipro 250 mg bid x 5 days  Return if symptoms worsen or fail to improve.      I, Teresa Kirschner, PA-C have reviewed all documentation for this visit. The documentation on  07/22/22  for the exam, diagnosis, procedures, and orders are all accurate and complete.  Teresa Kirschner, PA-C [facility] [address] [place], [place], [address] Office: [phone] Fax: [place]

## 2022-07-23 ENCOUNTER — Ambulatory Visit: Payer: Medicare Other | Admitting: Certified Registered"

## 2022-07-23 ENCOUNTER — Encounter
Admission: RE | Disposition: A | Discharge status: Home / Self Care | Payer: Self-pay | Source: Home / Self Care | Attending: Gastroenterology

## 2022-07-23 ENCOUNTER — Ambulatory Visit
Admission: RE | Admit: 2022-07-23 | Discharge: 2022-07-23 | Disposition: A | Discharge status: Home / Self Care | Payer: Medicare Other | Attending: Gastroenterology | Admitting: Gastroenterology

## 2022-07-23 ENCOUNTER — Other Ambulatory Visit: Payer: Self-pay

## 2022-07-23 ENCOUNTER — Encounter: Payer: Self-pay | Admitting: Gastroenterology

## 2022-07-23 DIAGNOSIS — K5730 Diverticulosis of large intestine without perforation or abscess without bleeding: Secondary | ICD-10-CM | POA: Diagnosis not present

## 2022-07-23 DIAGNOSIS — R159 Full incontinence of feces: Secondary | ICD-10-CM | POA: Insufficient documentation

## 2022-07-23 DIAGNOSIS — K6289 Other specified diseases of anus and rectum: Secondary | ICD-10-CM | POA: Diagnosis not present

## 2022-07-23 DIAGNOSIS — R140 Abdominal distension (gaseous): Secondary | ICD-10-CM

## 2022-07-23 HISTORY — PX: FLEXIBLE SIGMOIDOSCOPY: SHX5431

## 2022-07-23 SURGERY — SIGMOIDOSCOPY, FLEXIBLE
Anesthesia: General

## 2022-07-23 MED ORDER — SODIUM CHLORIDE 0.9 % IV SOLN: INTRAVENOUS | Status: DC
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
## 2022-07-23 NOTE — Op Note (Signed)
[facility] Gastroenterology Patient Name: Teresa Moyer Procedure Date: 07/23/2022 2:17 PM MRN: [id number] Account #: [id number] Date of Birth: 1936/02/12 Admit Type: Outpatient Age: 87 Room: [facility] ENDO ROOM 3 Gender: Female Note Status: Finalized Instrument Name: Upper Endoscope [id number] Procedure:             Flexible Sigmoidoscopy Indications:           Incontinence of feces Providers:             Lin Landsman MD, MD Referring MD:          No Local Md, MD (Referring MD) Medicines:             None Complications:         No immediate complications. Estimated blood loss: None. Procedure:             Pre-Anesthesia Assessment:                        - Prior to the procedure, a History and Physical was                         performed, and patient medications and allergies were                         reviewed. The patient is competent. The risks and                         benefits of the procedure and the sedation options and                         risks were discussed with the patient. All questions                         were answered and informed consent was obtained.                         Patient identification and proposed procedure were                         verified by the physician, the nurse and the                         technician in the pre-procedure area in the procedure                         room in the endoscopy suite. Mental Status                         Examination: alert and oriented. Airway Examination:                         normal oropharyngeal airway and neck mobility.                         Respiratory Examination: clear to auscultation. CV                         Examination: normal. Prophylactic Antibiotics: The  patient does not require prophylactic antibiotics.                         Prior Anticoagulants: The patient has taken no                         anticoagulant or antiplatelet agents. ASA  Grade                         Assessment: II - A patient with mild systemic disease.                         After reviewing the risks and benefits, the patient                         was deemed in satisfactory condition to undergo the                         procedure. The anesthesia plan was to use no sedation                         or anesthesia. Immediately prior to administration of                         medications, the patient was re-assessed for adequacy                         to receive sedatives. The heart rate, respiratory                         rate, oxygen saturations, blood pressure, adequacy of                         pulmonary ventilation, and response to care were                         monitored throughout the procedure. The physical                         status of the patient was re-assessed after the                         procedure.                        After obtaining informed consent, the scope was passed                         under direct vision. The Endoscope was introduced                         through the anus and advanced to the the descending                         colon. The flexible sigmoidoscopy was accomplished                         without difficulty. The patient tolerated the  procedure fairly well. The quality of the bowel                         preparation was good. Findings:      The digital rectal exam findings include decreased sphincter tone.      Normal mucosa was found in the rectum, in the recto-sigmoid colon, in       the sigmoid colon and in the descending colon. Biopsies for histology       were taken with a cold forceps for evaluation of microscopic colitis.      Multiple small-mouthed diverticula were found in the recto-sigmoid colon       and sigmoid colon.      Normal retroflexion in the rectum Impression:            - Decreased sphincter tone found on digital rectal                          exam.                        - Normal mucosa in the rectum, in the recto-sigmoid                         colon, in the sigmoid colon and in the descending                         colon. Biopsied.                        - Diverticulosis in the recto-sigmoid colon and in the                         sigmoid colon. Recommendation:        - Discharge patient to home (with escort).                        - Resume previous diet today.                        - Await pathology results. Procedure Code(s):     --- Professional ---                        [phone], Sigmoidoscopy, flexible; with biopsy, single or                         multiple Diagnosis Code(s):     --- Professional ---                        K62.89, Other specified diseases of anus and rectum                        R15.9, Full incontinence of feces                        K57.30, Diverticulosis of large intestine without                         perforation or abscess without bleeding CPT copyright 2022 [employer]. All rights reserved. The codes  documented in this report are preliminary and upon coder review may  be revised to meet current compliance requirements. Dr. Ulyess Mort Lin Landsman MD, MD 07/23/2022 2:43:51 PM This report has been signed electronically. Number of Addenda: 0 Note Initiated On: 07/23/2022 2:17 PM Estimated Blood Loss:  Estimated blood loss: none.      [facility]
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
## 2022-07-24 ENCOUNTER — Encounter: Payer: Self-pay | Admitting: Gastroenterology

## 2022-07-27 ENCOUNTER — Other Ambulatory Visit: Payer: Self-pay | Admitting: Physician Assistant

## 2022-07-27 DIAGNOSIS — N3001 Acute cystitis with hematuria: Secondary | ICD-10-CM

## 2022-07-27 LAB — URINE CULTURE

## 2022-07-27 MED ORDER — CEPHALEXIN 500 MG PO CAPS: 500.0000 mg | ORAL_CAPSULE | Freq: Two times a day (BID) | ORAL | 0 refills | Status: AC

## 2022-07-28 ENCOUNTER — Telehealth: Payer: Self-pay

## 2022-07-28 LAB — SURGICAL PATHOLOGY

## 2022-07-28 NOTE — Telephone Encounter (Signed)
-----   Message from Lin Landsman, MD sent at 07/28/2022 10:34 AM EST ----- Please inform patient that the pathology results from her colon came back normal.  Recommend to continue doing Kegel exercises.  RV

## 2022-07-28 NOTE — Telephone Encounter (Signed)
Patient verbalized understanding of results  

## 2022-08-04 ENCOUNTER — Telehealth: Payer: Self-pay | Admitting: Physician Assistant

## 2022-08-04 NOTE — Telephone Encounter (Signed)
[facility] faxed refill request for the following medications:   furosemide (LASIX) 40 MG tablet    Please advise.

## 2022-08-05 ENCOUNTER — Other Ambulatory Visit: Payer: Self-pay | Admitting: Physician Assistant

## 2022-08-05 DIAGNOSIS — I5043 Acute on chronic combined systolic (congestive) and diastolic (congestive) heart failure: Secondary | ICD-10-CM

## 2022-08-05 MED ORDER — FUROSEMIDE 40 MG PO TABS: 40.0000 mg | ORAL_TABLET | Freq: Two times a day (BID) | ORAL | 1 refills | Status: DC

## 2022-08-05 NOTE — Telephone Encounter (Signed)
Last refill 7/23 by Dr. Hetty Ely advise

## 2022-08-05 NOTE — Telephone Encounter (Signed)
filled

## 2022-09-09 ENCOUNTER — Encounter: Payer: Self-pay | Admitting: Obstetrics and Gynecology

## 2022-09-09 ENCOUNTER — Ambulatory Visit (INDEPENDENT_AMBULATORY_CARE_PROVIDER_SITE_OTHER): Payer: Medicare Other | Admitting: Obstetrics and Gynecology

## 2022-09-09 VITALS — BP 127/75 | HR 67 | Ht 59.0 in | Wt 143.0 lb

## 2022-09-09 DIAGNOSIS — N3281 Overactive bladder: Secondary | ICD-10-CM

## 2022-09-09 DIAGNOSIS — N393 Stress incontinence (female) (male): Secondary | ICD-10-CM | POA: Diagnosis not present

## 2022-09-09 DIAGNOSIS — R350 Frequency of micturition: Secondary | ICD-10-CM

## 2022-09-09 DIAGNOSIS — R159 Full incontinence of feces: Secondary | ICD-10-CM | POA: Diagnosis not present

## 2022-09-09 LAB — POCT URINALYSIS DIPSTICK
Bilirubin, UA: NEGATIVE
Glucose, UA: NEGATIVE
Ketones, UA: NEGATIVE
Leukocytes, UA: NEGATIVE
Nitrite, UA: NEGATIVE
Protein, UA: NEGATIVE
Spec Grav, UA: 1.015 (ref 1.010–1.025)
Urobilinogen, UA: 0.2 E.U./dL
pH, UA: 6 (ref 5.0–8.0)

## 2022-09-09 MED ORDER — TROSPIUM CHLORIDE ER 60 MG PO CP24: 60.0000 mg | ORAL_CAPSULE | Freq: Every day | ORAL | 5 refills | Status: DC
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
## 2022-09-09 NOTE — Patient Instructions (Addendum)
Today we talked about ways to manage bladder urgency such as altering your diet to avoid irritative beverages and foods (bladder diet) as well as attempting to decrease stress and other exacerbating factors.   ° °The Most Bothersome Foods* The Least Bothersome Foods*  °Coffee - Regular & Decaf °Tea - caffeinated °Carbonated beverages - cola, non-colas, diet & caffeine-free °Alcohols - Beer, Red Wine, White Wine, Champagne °Fruits - Grapefruit, Lemon, Orange, Pineapple °Fruit Juices - Cranberry, Grapefruit, Orange, Pineapple °Vegetables - Tomato & Tomato Products °Flavor Enhancers - Hot peppers, Spicy foods, Chili, Horseradish, Vinegar, Monosodium glutamate (MSG) °Artificial Sweeteners - NutraSweet, Sweet 'N Low, Equal (sweetener), Saccharin °Ethnic foods - Mexican, Thai, Indian food Water °Milk - low-fat & whole °Fruits - Bananas, Blueberries, Honeydew melon, Pears, Raisins, Watermelon °Vegetables - Broccoli, Brussels Sprouts, Cabbage, Carrots, Cauliflower, Celery, Cucumber, Mushrooms, Peas, Radishes, Squash, Zucchini, White potatoes, Sweet potatoes & yams °Poultry - Chicken, Eggs, Turkey, °Meat - Beef, Pork, Lamb °Seafood - Shrimp, Tuna fish, Salmon °Grains - Oat, Rice °Snacks - Pretzels, Popcorn  °*Friedlander J. et al. Diet and its role in interstitial cystitis/bladder pain syndrome (IC/BPS) and comorbid conditions. [employer]. [employer] Int. 2012 Jan 11.  ° ° °Accidental Bowel Leakage: Our goal is to achieve formed bowel movements daily or every-other-day without leakage.  You may need to try different combinations of the following options to find what works best for you.  Some management options include: °Dietary changes (more leafy greens, vegetables and fruits; less processed foods) °Fiber supplementation (Metamucil or something with psyllium as active ingredient) °Over-the-counter imodium (tablets or liquid) to help solidify the stool and prevent leakage of stool. ° ° °

## 2022-10-05 ENCOUNTER — Telehealth: Payer: Self-pay | Admitting: Physician Assistant

## 2022-10-05 MED ORDER — ALENDRONATE SODIUM 70 MG PO TABS: 70.0000 mg | ORAL_TABLET | ORAL | 11 refills | Status: DC

## 2022-10-05 NOTE — Telephone Encounter (Signed)
[facility] pharmacy faxed refill request for the following medications:    alendronate (FOSAMAX) 70 MG tablet    Please advise  

## 2022-10-05 NOTE — Telephone Encounter (Signed)
Disregard last note. Rx sent

## 2022-10-05 NOTE — Telephone Encounter (Signed)
Requesting too soon LRF 10/09/21 #[phone] Refills

## 2022-10-05 NOTE — Addendum Note (Signed)
Addended by: Lily Kocher on: 10/05/2022 04:47 PM   Modules accepted: Orders

## 2022-10-20 ENCOUNTER — Other Ambulatory Visit: Payer: Self-pay | Admitting: Physician Assistant

## 2022-10-20 ENCOUNTER — Ambulatory Visit (INDEPENDENT_AMBULATORY_CARE_PROVIDER_SITE_OTHER): Payer: Medicare Other | Admitting: Physician Assistant

## 2022-10-20 ENCOUNTER — Encounter: Payer: Self-pay | Admitting: Physician Assistant

## 2022-10-20 VITALS — BP 112/57 | HR 69 | Temp 98.0°F | Ht 59.0 in | Wt 149.0 lb

## 2022-10-20 DIAGNOSIS — R1030 Lower abdominal pain, unspecified: Secondary | ICD-10-CM | POA: Diagnosis not present

## 2022-10-20 DIAGNOSIS — N3000 Acute cystitis without hematuria: Secondary | ICD-10-CM

## 2022-10-20 MED ORDER — CEPHALEXIN 500 MG PO CAPS: 500.0000 mg | ORAL_CAPSULE | Freq: Two times a day (BID) | ORAL | 0 refills | Status: AC

## 2022-10-20 NOTE — Progress Notes (Signed)
Established patient visit   Patient: Teresa Moyer   DOB: Apr 21, 1936   87 y.o. Female  MRN: [id number] Visit Date: 10/20/2022  Today's healthcare provider: [name], PA-C   Chief Complaint  Patient presents with   Urinary Tract Infection   Subjective     Pt reports lower abdominal discomfort, had one episode where she held her urine in and it was painful after holding it. Reports frequency. Denies dysuria, hematuria. Denies fever.   Last time she went was right before leaving.   Medications: Outpatient Medications Prior to Visit  Medication Sig   acetaminophen (TYLENOL) 650 MG CR tablet Take by mouth.   alendronate (FOSAMAX) 70 MG tablet Take 1 tablet (70 mg total) by mouth every 7 (seven) days. Take with a full glass of water on an empty stomach.   aspirin 81 MG chewable tablet Chew 81 mg by mouth daily.   atorvastatin (LIPITOR) 40 MG tablet Take 1 tablet (40 mg total) by mouth daily at 6 PM.   azelastine (ASTELIN) 0.1 % nasal spray Place 1 spray into both nostrils 2 (two) times daily.   Carboxymethylcell-Hypromellose (GENTEAL) 0.25-0.3 % GEL Apply 1 application to eye at bedtime.    carvedilol (COREG) 12.5 MG tablet Take 12.5 mg by mouth 2 (two) times daily with a meal.    cetirizine (ZYRTEC) 10 MG tablet Take 1 tablet (10 mg total) by mouth at bedtime.   fluticasone (FLONASE) 50 MCG/ACT nasal spray Place 1 spray into both nostrils daily.    furosemide (LASIX) 40 MG tablet Take 1 tablet (40 mg total) by mouth 2 (two) times daily.   hydrOXYzine (VISTARIL) 25 MG capsule Take 1 capsule (25 mg total) by mouth 3 (three) times daily as needed.   metolazone (ZAROXOLYN) 2.5 MG tablet Take 1 tablet (2.5 mg total) by mouth every other day as needed (Increased swelling, shortness of breath or over 2 pound weight gain).   Multiple Vitamins-Minerals (PRESERVISION AREDS PO) Take 1 tablet by mouth 2 (two) times daily.    omeprazole (PRILOSEC) 20 MG capsule Take 1 capsule (20 mg  total) by mouth daily.   potassium chloride (KLOR-CON) 10 MEQ tablet TAKE 1 TABLET BY MOUTH TWICE DAILY   Probiotic Product (ALIGN) 4 MG CAPS Take 8 mg by mouth daily after lunch.   sacubitril-valsartan (ENTRESTO) 49-51 MG Take 1 tablet by mouth 2 (two) times daily.   senna-docusate (SENOKOT-S) 8.6-50 MG tablet Take 1 tablet by mouth 2 (two) times daily as needed for moderate constipation.   Trospium Chloride 60 MG CP24 Take 1 capsule (60 mg total) by mouth daily.   No facility-administered medications prior to visit.    Review of Systems  Constitutional:  Negative for fatigue and fever.  Respiratory:  Negative for cough and shortness of breath.   Cardiovascular:  Negative for chest pain and leg swelling.  Gastrointestinal:  Positive for abdominal pain.  Genitourinary:  Positive for frequency.  Neurological:  Negative for dizziness and headaches.      Objective    BP (!) 112/57 (BP Location: Left Arm, Patient Position: Sitting, Cuff Size: Small)   Pulse 69   Temp 98 F (36.7 C)   Ht 4\' 11"  (1.499 m)   Wt 149 lb (67.6 kg)   SpO2 98%   BMI 30.09 kg/m   Physical Exam Constitutional:      General: She is awake.     Appearance: She is well-developed.  HENT:     Head:  Normocephalic.  Eyes:     Conjunctiva/sclera: Conjunctivae normal.  Cardiovascular:     Rate and Rhythm: Normal rate and regular rhythm.     Heart sounds: Murmur heard.  Pulmonary:     Effort: Pulmonary effort is normal.     Breath sounds: Normal breath sounds.  Skin:    General: Skin is warm.  Neurological:     Mental Status: She is alert and oriented to person, place, and time.  Psychiatric:        Attention and Perception: Attention normal.        Mood and Affect: Mood normal.        Speech: Speech normal.        Behavior: Behavior is cooperative.      No results found for any visits on 10/20/22.  Assessment & Plan     Lower abdominal pain 2. Acute Cystitis Pt unable to void in office, given  sample cup to bring back. Will run culture. Given h/o fecal incontinence, pt is high risk for UTI and given symptoms, will tx. Keflex 500 mg bid x 5 days  Return if symptoms worsen or fail to improve.      I, [name], PA-C have reviewed all documentation for this visit. The documentation on  10/20/22   for the exam, diagnosis, procedures, and orders are all accurate and complete.  [name], PA-C [facility] [address] [place], [place], [address] Office: [phone] Fax: [phone]   [facility]

## 2022-10-23 LAB — URINE CULTURE

## 2022-10-27 NOTE — Progress Notes (Signed)
[facility] Return Visit  SUBJECTIVE  History of Present Illness: Teresa Moyer is a 87 y.o. female seen in follow-up for OAB and FI. Plan at last visit was start Trospium 60mg  ER daily and use a bulking agent for stool.    Last few mornings she has had some fecal leakage that she relates to eating beef.   Is taking Psyllium husk. Patient reports she takes her diuretic at lunch time and takes her Trospium 60mg  ER in the early morning. She reports about a 30% improvement.    Past Medical History: Patient  has a past medical history of Cataracts, bilateral, CHF (congestive heart failure) (HCC) (02/2019), Coronary artery disease, Environmental allergies, Family history of adverse reaction to anesthesia, GERD (gastroesophageal reflux disease), Heart murmur (2019), Hemorrhoids, History of chickenpox, Hypertension, Incontinence in female, Macular degeneration, Myocardial infarction (HCC) (01/2019), Osteoarthritis, Skin cancer (12/2017/11-2019), and Type O blood, Rh negative (12/2017).   Past Surgical History: She  has a past surgical history that includes Cosmetic surgery (1946); Skin cancer excision; Abdominal hysterectomy; Tonsillectomy; Mohs surgery (08/2011); Pars plana vitrectomy w/ repair of macular hole; Colonoscopy with propofol (N/A, 08/30/2015); cataract (Bilateral, 2009); Eye surgery (Bilateral, 2009); Eye surgery (2010); Eye surgery (2011); Joint replacement (Right, 06/01/2012); Cholecystectomy (N/A, 01/12/2018); Cholecystectomy, laparoscopic; Mohs surgery; Coronary/Graft Acute MI Revascularization (N/A, 12/27/2018); LEFT HEART CATH AND CORONARY ANGIOGRAPHY (N/A, 12/27/2018); Knee Arthroplasty (Left, 12/20/2019); Esophagogastroduodenoscopy (egd) with propofol (N/A, 01/05/2020); and Flexible sigmoidoscopy (N/A, 07/23/2022).   Medications: She has a current medication list which includes the following prescription(s): acetaminophen, alendronate, aspirin, atorvastatin, azelastine,  genteal, carvedilol, cetirizine, fluticasone, furosemide, hydroxyzine, metolazone, multiple vitamins-minerals, omeprazole, potassium chloride, align, entresto, senna-docusate, and trospium chloride.   Allergies: Patient is allergic to accupril [quinapril hcl], sulfa antibiotics, clinoril [sulindac], hydrochlorothiazide, spironolactone, tessalon [benzonatate], amoxicillin, codeine sulfate, and penicillins.   Social History: Patient  reports that she has never smoked. She has never used smokeless tobacco. She reports that she does not drink alcohol and does not use drugs.      OBJECTIVE     Physical Exam: Vitals:   10/28/22 1514  BP: (!) 153/71  Pulse: 60   Gen: No apparent distress, A&O x 3.  Detailed Urogynecologic Evaluation:  Deferred.      ASSESSMENT AND PLAN    Teresa Moyer is a 87 y.o. with:  1. Overactive bladder   2. Incontinence of feces, unspecified fecal incontinence type   3. Urinary frequency    Will plan to keep patient on Trospium 60mg  ER daily at this time. She is having a difficult time with cost, will attempt a tier exception as she is unable to take other anticholinergics that cross the blood brain barrier and the Beta 3 Agonists are estimated to be 90-100$ per month.  Patient reports she was doing very well up until eating beef that caused x3 days with fecal leakage. She reports she plans to avoid this as a trigger. Overall she feels the psyllium supplement is doing well.  Plans to start PT in mid June for this.   Plan for patient to follow up in August to see how PT helped and how medication is doing.

## 2022-10-28 ENCOUNTER — Encounter: Payer: Self-pay | Admitting: Obstetrics and Gynecology

## 2022-10-28 ENCOUNTER — Ambulatory Visit (INDEPENDENT_AMBULATORY_CARE_PROVIDER_SITE_OTHER): Payer: Medicare Other | Admitting: Obstetrics and Gynecology

## 2022-10-28 VITALS — BP 153/71 | HR 60

## 2022-10-28 DIAGNOSIS — R350 Frequency of micturition: Secondary | ICD-10-CM | POA: Diagnosis not present

## 2022-10-28 DIAGNOSIS — R159 Full incontinence of feces: Secondary | ICD-10-CM

## 2022-10-28 DIAGNOSIS — N3281 Overactive bladder: Secondary | ICD-10-CM

## 2022-10-28 NOTE — Patient Instructions (Addendum)
Continue on your fiber supplement   I will try to get the Trospium at a cheaper cost so it is more affordable.   Start physical therapy in Mid June.

## 2022-10-29 NOTE — Progress Notes (Signed)
A tier exception has been initiated and is awaiting a response. Reference # PA - [id number]

## 2022-11-17 NOTE — Progress Notes (Signed)
Teir reduction was Approved and reduced to a teir 2 Auth from: 10/29/22- 06/15/23 Approved Case # [id number]

## 2022-11-24 ENCOUNTER — Ambulatory Visit: Payer: Medicare Other | Admitting: Physician Assistant

## 2022-11-25 NOTE — Progress Notes (Addendum)
I,Teresa  Moyer,acting as a Neurosurgeon for [employer], PA-C.,have documented all relevant documentation on the behalf of Teresa Ferguson, PA-C,as directed by  Teresa Ferguson, PA-C while in the presence of Teresa Ferguson, PA-C.   Established patient visit   Patient: Teresa Moyer   DOB: 1935-10-22   87 y.o. Female  MRN: [id number] Visit Date: 11/26/2022  Today's healthcare provider: [name], PA-C  Cc. Htn, predm, hld f/u. Sinus issues  Subjective    HPI   She also states she has been feeling sick for about 3 weeks. She has had a runny knows and feels pressure in her nose and ears. Is unsure if it could also be due to sinuses.  Productive cough, has flonase azelastine and antihistamines with no improvement. Denies fever.  Hypertension, follow-up  BP Readings from Last 3 Encounters:  11/26/22 117/65  10/28/22 (!) 153/71  10/20/22 (!) 112/57   Wt Readings from Last 3 Encounters:  11/26/22 148 lb 14.4 oz (67.5 kg)  10/20/22 149 lb (67.6 kg)  09/09/22 143 lb (64.9 kg)     She was last seen for hypertension 5 months ago.  BP at that visit was 125/57. Management since that visit includes no changes.  She reports good compliance with treatment. She is not having side effects She is following a Regular diet. She is exercising. She does not smoke.  Outside blood pressures are checked about 2 days ago and it was 117/65  Symptoms: No chest pain No chest pressure  No palpitations No syncope  No dyspnea No orthopnea  No paroxysmal nocturnal dyspnea No lower extremity edema   Pertinent labs Lab Results  Component Value Date   CHOL 111 09/15/2021   HDL 68 09/15/2021   LDLCALC 25 09/15/2021   TRIG 93 09/15/2021   CHOLHDL 1.6 09/15/2021   Lab Results  Component Value Date   NA 136 04/14/2022   K 4.2 04/14/2022   CREATININE 0.82 04/14/2022   GFRNONAA >60 04/14/2022   GLUCOSE 104 (H) 04/14/2022   TSH 2.050 03/11/2022     The ASCVD Risk score (Arnett DK, et  al., 2019) failed to calculate for the following reasons:   The 2019 ASCVD risk score is only valid for ages 92 to 70   The patient has a prior MI or stroke diagnosis  ---------------------------------------------------------------------------------------------------  Prediabetes, Follow-up  Lab Results  Component Value Date   HGBA1C 6.2 (A) 11/26/2022   HGBA1C 6.1 (A) 07/13/2022   HGBA1C 6.1 (H) 03/11/2022   GLUCOSE 104 (H) 04/14/2022   GLUCOSE 155 (H) 02/09/2022   GLUCOSE 101 (H) 01/07/2022    Last seen for for this5 months ago.  Management since that visit includes no changes   Pertinent Labs:    Component Value Date/Time   CHOL 111 09/15/2021 0927   TRIG 93 09/15/2021 0927   CHOLHDL 1.6 09/15/2021 0927   CREATININE 0.82 04/14/2022 1335    Wt Readings from Last 3 Encounters:  11/26/22 148 lb 14.4 oz (67.5 kg)  10/20/22 149 lb (67.6 kg)  09/09/22 143 lb (64.9 kg)    -----------------------------------------------------------------------------------------  Lipid/Cholesterol, Follow-up  Last lipid panel Other pertinent labs  Lab Results  Component Value Date   CHOL 111 09/15/2021   HDL 68 09/15/2021   LDLCALC 25 09/15/2021   TRIG 93 09/15/2021   CHOLHDL 1.6 09/15/2021   Lab Results  Component Value Date   ALT 19 04/14/2022   AST 24 04/14/2022   PLT 189 04/14/2022  TSH 2.050 03/11/2022      The ASCVD Risk score (Arnett DK, et al., 2019) failed to calculate for the following reasons:   The 2019 ASCVD risk score is only valid for ages 57 to 28   The patient has a prior MI or stroke diagnosis  ---------------------------------------------------------------------------------------------------  Medications: Outpatient Medications Prior to Visit  Medication Sig   acetaminophen (TYLENOL) 650 MG CR tablet Take by mouth.   alendronate (FOSAMAX) 70 MG tablet Take 1 tablet (70 mg total) by mouth every 7 (seven) days. Take with a full glass of water on an empty  stomach.   aspirin 81 MG chewable tablet Chew 81 mg by mouth daily.   atorvastatin (LIPITOR) 40 MG tablet Take 1 tablet (40 mg total) by mouth daily at 6 PM.   azelastine (ASTELIN) 0.1 % nasal spray Place 1 spray into both nostrils 2 (two) times daily.   Carboxymethylcell-Hypromellose (GENTEAL) 0.25-0.3 % GEL Apply 1 application to eye at bedtime.    carvedilol (COREG) 12.5 MG tablet Take 12.5 mg by mouth 2 (two) times daily with a meal.    cetirizine (ZYRTEC) 10 MG tablet Take 1 tablet (10 mg total) by mouth at bedtime.   fluticasone (FLONASE) 50 MCG/ACT nasal spray Place 1 spray into both nostrils daily.    furosemide (LASIX) 40 MG tablet Take 1 tablet (40 mg total) by mouth 2 (two) times daily.   hydrOXYzine (VISTARIL) 25 MG capsule Take 1 capsule (25 mg total) by mouth 3 (three) times daily as needed.   metolazone (ZAROXOLYN) 2.5 MG tablet Take 1 tablet (2.5 mg total) by mouth every other day as needed (Increased swelling, shortness of breath or over 2 pound weight gain).   Multiple Vitamins-Minerals (PRESERVISION AREDS PO) Take 1 tablet by mouth 2 (two) times daily.    omeprazole (PRILOSEC) 20 MG capsule Take 1 capsule (20 mg total) by mouth daily.   potassium chloride (KLOR-CON) 10 MEQ tablet TAKE 1 TABLET BY MOUTH TWICE DAILY   Probiotic Product (ALIGN) 4 MG CAPS Take 8 mg by mouth daily after lunch.   sacubitril-valsartan (ENTRESTO) 49-51 MG Take 1 tablet by mouth 2 (two) times daily.   senna-docusate (SENOKOT-S) 8.6-50 MG tablet Take 1 tablet by mouth 2 (two) times daily as needed for moderate constipation.   Trospium Chloride 60 MG CP24 Take 1 capsule (60 mg total) by mouth daily.   No facility-administered medications prior to visit.    Review of Systems  Constitutional:  Negative for fatigue and fever.  HENT:  Positive for congestion, postnasal drip, sinus pressure and sore throat.   Respiratory:  Positive for cough. Negative for shortness of breath.   Cardiovascular:  Negative  for chest pain and leg swelling.  Gastrointestinal:  Negative for abdominal pain.  Neurological:  Negative for dizziness and headaches.      Objective    BP 117/65 Comment: home value  Pulse 63   Temp 97.9 F (36.6 C) (Oral)   Resp 13   Ht 4\' 11"  (1.499 m)   Wt 148 lb 14.4 oz (67.5 kg)   SpO2 100%   BMI 30.07 kg/m   Physical Exam Constitutional:      General: She is awake.     Appearance: She is well-developed.  HENT:     Head: Normocephalic.     Right Ear: There is impacted cerumen.     Left Ear: There is impacted cerumen.     Nose: Congestion present.     Mouth/Throat:  Pharynx: Posterior oropharyngeal erythema present. No oropharyngeal exudate.  Eyes:     Conjunctiva/sclera: Conjunctivae normal.  Cardiovascular:     Rate and Rhythm: Normal rate and regular rhythm.     Heart sounds: Murmur heard.  Pulmonary:     Effort: Pulmonary effort is normal.     Breath sounds: Normal breath sounds.  Skin:    General: Skin is warm.  Neurological:     Mental Status: She is alert and oriented to person, place, and time.  Psychiatric:        Attention and Perception: Attention normal.        Mood and Affect: Mood normal.        Speech: Speech normal.        Behavior: Behavior is cooperative.     Results for orders placed or performed in visit on 11/26/22  POCT glycosylated hemoglobin (Hb A1C)  Result Value Ref Range   Hemoglobin A1C 6.2 (A) 4.0 - 5.6 %   HbA1c POC (<> result, manual entry)     HbA1c, POC (prediabetic range)     HbA1c, POC (controlled diabetic range)      Assessment & Plan     Problem List Items Addressed This Visit       Cardiovascular and Mediastinum   Hypertension    Elevated in office normal at home  Cont meds as rx      Relevant Orders   Comprehensive Metabolic Panel (CMET)   CBC w/Diff/Platelet   Chronic systolic CHF (congestive heart failure), NYHA class 2 (HCC)    Euvolemic today      Relevant Orders   Comprehensive Metabolic  Panel (CMET)   CBC w/Diff/Platelet     Other   HLD (hyperlipidemia)    Managed with lipitor 40  Repeat fasting lipids      Relevant Orders   Lipid Profile   Prediabetes - Primary    Poc A1c 6.2% previously 6.1%      Relevant Orders   POCT glycosylated hemoglobin (Hb A1C) (Completed)   Other Visit Diagnoses     Elevated hemoglobin (HCC)       Relevant Orders   CBC w/Diff/Platelet   Acute non-recurrent frontal sinusitis       Relevant Medications   doxycycline (VIBRA-TABS) 100 MG tablet      Acute sinusitis Rx doxy bid x 7 days, all to PCN Cont otc therapy  Return in about 6 months (around 05/28/2023) for CPE, AVW.      I, Teresa Ferguson, PA-C have reviewed all documentation for this visit. The documentation on  11/26/22   for the exam, diagnosis, procedures, and orders are all accurate and complete.  Teresa Ferguson, PA-C [facility] [address] [place], [place], [address] Office: [phone] Fax: [phone]   [facility]

## 2022-11-26 ENCOUNTER — Encounter: Payer: Self-pay | Admitting: Physician Assistant

## 2022-11-26 ENCOUNTER — Ambulatory Visit (INDEPENDENT_AMBULATORY_CARE_PROVIDER_SITE_OTHER): Payer: Medicare Other | Admitting: Physician Assistant

## 2022-11-26 VITALS — BP 117/65 | HR 63 | Temp 97.9°F | Resp 13 | Ht 59.0 in | Wt 148.9 lb

## 2022-11-26 DIAGNOSIS — E782 Mixed hyperlipidemia: Secondary | ICD-10-CM

## 2022-11-26 DIAGNOSIS — I10 Essential (primary) hypertension: Secondary | ICD-10-CM | POA: Diagnosis not present

## 2022-11-26 DIAGNOSIS — I5022 Chronic systolic (congestive) heart failure: Secondary | ICD-10-CM

## 2022-11-26 DIAGNOSIS — J0110 Acute frontal sinusitis, unspecified: Secondary | ICD-10-CM

## 2022-11-26 DIAGNOSIS — D582 Other hemoglobinopathies: Secondary | ICD-10-CM | POA: Diagnosis not present

## 2022-11-26 DIAGNOSIS — R7303 Prediabetes: Secondary | ICD-10-CM | POA: Diagnosis not present

## 2022-11-26 LAB — POCT GLYCOSYLATED HEMOGLOBIN (HGB A1C): Hemoglobin A1C: 6.2 % — AB (ref 4.0–5.6)

## 2022-11-26 MED ORDER — DOXYCYCLINE HYCLATE 100 MG PO TABS: 100.0000 mg | ORAL_TABLET | Freq: Two times a day (BID) | ORAL | 0 refills | Status: DC

## 2022-11-26 NOTE — Assessment & Plan Note (Signed)
Managed with lipitor 40  Repeat fasting lipids

## 2022-11-26 NOTE — Assessment & Plan Note (Signed)
Elevated in office normal at home  Cont meds as rx

## 2022-11-26 NOTE — Assessment & Plan Note (Signed)
Euvolemic today. 

## 2022-11-26 NOTE — Assessment & Plan Note (Signed)
Poc A1c 6.2% previously 6.1%

## 2022-11-29 LAB — LIPID PANEL
Chol/HDL Ratio: 2.1 ratio (ref 0.0–4.4)
Cholesterol, Total: 120 mg/dL (ref 100–199)
HDL: 58 mg/dL (ref >39)
LDL Chol Calc (NIH): 37 mg/dL (ref 0–99)
Triglycerides: 153 mg/dL — ABNORMAL HIGH (ref 0–149)
VLDL Cholesterol Cal: 25 mg/dL (ref 5–40)

## 2022-11-29 LAB — CBC WITH DIFFERENTIAL/PLATELET
Basophils Absolute: 0.1 10*3/uL (ref 0.0–0.2)
Basos: 0 %
EOS (ABSOLUTE): 0.1 10*3/uL (ref 0.0–0.4)
Eos: 1 %
Hematocrit: 39.7 % (ref 34.0–46.6)
Hemoglobin: 13.3 g/dL (ref 11.1–15.9)
Immature Grans (Abs): 0 10*3/uL (ref 0.0–0.1)
Immature Granulocytes: 0 %
Lymphocytes Absolute: 7.3 10*3/uL — ABNORMAL HIGH (ref 0.7–3.1)
Lymphs: 53 %
MCH: 30.4 pg (ref 26.6–33.0)
MCHC: 33.5 g/dL (ref 31.5–35.7)
MCV: 91 fL (ref 79–97)
Monocytes Absolute: 0.7 10*3/uL (ref 0.1–0.9)
Monocytes: 5 %
Neutrophils Absolute: 5.7 10*3/uL (ref 1.4–7.0)
Neutrophils: 41 %
Platelets: 204 10*3/uL (ref 150–450)
RBC: 4.38 x10E6/uL (ref 3.77–5.28)
RDW: 12.7 % (ref 11.7–15.4)
WBC: 13.9 10*3/uL — ABNORMAL HIGH (ref 3.4–10.8)

## 2022-11-29 LAB — COMPREHENSIVE METABOLIC PANEL
ALT: 13 IU/L (ref 0–32)
AST: 18 IU/L (ref 0–40)
Albumin/Globulin Ratio: 2
Albumin: 4.4 g/dL (ref 3.7–4.7)
Alkaline Phosphatase: 85 IU/L (ref 44–121)
BUN/Creatinine Ratio: 22 (ref 12–28)
BUN: 19 mg/dL (ref 8–27)
Bilirubin Total: 0.8 mg/dL (ref 0.0–1.2)
CO2: 24 mmol/L (ref 20–29)
Calcium: 9.3 mg/dL (ref 8.7–10.3)
Chloride: 102 mmol/L (ref 96–106)
Creatinine, Ser: 0.88 mg/dL (ref 0.57–1.00)
Globulin, Total: 2.2 g/dL (ref 1.5–4.5)
Glucose: 101 mg/dL — ABNORMAL HIGH (ref 70–99)
Potassium: 4.2 mmol/L (ref 3.5–5.2)
Sodium: 140 mmol/L (ref 134–144)
Total Protein: 6.6 g/dL (ref 6.0–8.5)
eGFR: 64 mL/min/{1.73_m2} (ref >59)

## 2022-12-01 ENCOUNTER — Telehealth: Payer: Self-pay

## 2022-12-01 NOTE — Telephone Encounter (Signed)
Copied from CRM [id number]. Topic: General - Other >> Dec 01, 2022  1:28 PM Ja-Kwan M wrote: Reason for CRM: Pt requests call back to discuss lab results. Pt also request would like to speak with Okey Regal.

## 2022-12-02 ENCOUNTER — Encounter: Payer: Self-pay | Admitting: Physical Therapy

## 2022-12-02 ENCOUNTER — Ambulatory Visit: Payer: Medicare Other | Attending: Obstetrics and Gynecology | Admitting: Physical Therapy

## 2022-12-02 DIAGNOSIS — R279 Unspecified lack of coordination: Secondary | ICD-10-CM | POA: Insufficient documentation

## 2022-12-02 DIAGNOSIS — M533 Sacrococcygeal disorders, not elsewhere classified: Secondary | ICD-10-CM | POA: Diagnosis present

## 2022-12-02 DIAGNOSIS — R2689 Other abnormalities of gait and mobility: Secondary | ICD-10-CM | POA: Insufficient documentation
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
## 2022-12-03 NOTE — Telephone Encounter (Signed)
Patient was advised.  

## 2022-12-03 NOTE — Patient Instructions (Signed)
Wear shoe lift into R shoe, remove if uncomfortable

## 2022-12-09 ENCOUNTER — Ambulatory Visit: Payer: Medicare Other | Admitting: Physical Therapy

## 2022-12-09 DIAGNOSIS — R2689 Other abnormalities of gait and mobility: Secondary | ICD-10-CM

## 2022-12-09 DIAGNOSIS — M533 Sacrococcygeal disorders, not elsewhere classified: Secondary | ICD-10-CM | POA: Diagnosis not present

## 2022-12-09 DIAGNOSIS — R279 Unspecified lack of coordination: Secondary | ICD-10-CM
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
## 2022-12-09 NOTE — Patient Instructions (Signed)
  _________  Feet slides :   Points of contact at sitting bones  Four points of contact of foot,  Side knee back while keeping knee out along 2-3rd toe line   Heel up, ankle not twist out Lower heel while keeping knee out along 2-3rd toe line Four points of contact of foot, Slide foot back while keeping knee out along 2-3rd toe line   Repeated with other foot   2 min,  x 3 a xday   __   Wear pedicure spread upside down to spread toes when watching TV 30 min

## 2022-12-16 ENCOUNTER — Ambulatory Visit: Payer: Medicare Other | Attending: Obstetrics and Gynecology | Admitting: Physical Therapy

## 2022-12-16 DIAGNOSIS — R279 Unspecified lack of coordination: Secondary | ICD-10-CM

## 2022-12-16 DIAGNOSIS — M533 Sacrococcygeal disorders, not elsewhere classified: Secondary | ICD-10-CM

## 2022-12-16 DIAGNOSIS — R2689 Other abnormalities of gait and mobility: Secondary | ICD-10-CM
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
## 2022-12-21 ENCOUNTER — Ambulatory Visit (INDEPENDENT_AMBULATORY_CARE_PROVIDER_SITE_OTHER): Payer: Medicare Other

## 2022-12-21 VITALS — Ht 59.0 in | Wt 148.0 lb

## 2022-12-21 DIAGNOSIS — Z0000 Encounter for general adult medical examination without abnormal findings: Secondary | ICD-10-CM
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
## 2022-12-21 NOTE — Patient Instructions (Signed)
Teresa Moyer , Thank you for taking time to come for your Medicare Wellness Visit. I appreciate your ongoing commitment to your health goals. Please review the following plan we discussed and let me know if I can assist you in the future.   These are the goals we discussed:  Goals       Chronic Disease Management      CARE PLAN ENTRY (see longitudinal plan of care for additional care plan information)  Current Barriers:  Chronic Disease Management support and education needs related to HTN, CAP, CHF, GERD, and HLD. (Hx of Skin Ca)  Case Manager Clinical Goal(s):  Over the next 90 days, patient will: Not require hospitalization d/t complications r/t chronic illnesses. Over the next 120 days, patient will: Continue to take all medications as prescribed. Continue attending appointments as scheduled. Monitor blood pressure and record readings. Monitor weight and record readings. Adhere to recommended cardiac prudent/heart healthy diet. Follow recommended safety precautions to prevent falls and injuries.   Interventions:  Inter-disciplinary care team collaboration (see longitudinal plan of care) Discussed plan for care management.  Teresa Moyer has met her care management goals. Remains compliant with medications and treatment recommendations. Reports monitoring BP and weights as advised. Denies worsening s/sx r/t COPD exacerbation. No decline in activity tolerance or ability to perform self care. Reports doing well. No new concerns or changes in care management needs. She will follow up with the Cardiology team next month. She is scheduled for a PCP/clinic visit in April. She agreed to notify Dr. Sullivan Lone or call if her health needs change and additional outreach/care coordination is required.       Patient Self Care Activities:  Self administers medications. Attends scheduled provider appointments Calls pharmacy for medication refills Performs ADL's independently Performs IADL's  independently Calls provider office for new concerns or questions   Please see past updates related to this goal by clicking on the "Past Updates" button in the selected goal        I need to make sure my stent does not close up (pt-stated)      Current Barriers:  Chronic Disease Management support and education needs related to CAD  Nurse Case Manager Clinical Goal(s):  Over the next 14 days, patient will verbalize understanding of plan for CAD management including exercise, heart healthy diet, medication management/compliance (statin, betablocker, antiplatelet), BP control Over the next 90 days, patient will not experience hospital admission. Hospital Admissions in last 6 months = 2  Interventions:  Evaluation of current treatment plan related to CAD/recent MI and patient's adherence to plan as established by provider. Discussed plans with patient for ongoing care management follow up and provided patient with direct contact information for care management team Provided emotional support and reassurance regarding transition from Brillinta to Plavix secondary to ongoing shortness of breath Reviewed s/s of MI and assessed patients knowledge of when to call 911 Reviewed scheduled/upcoming provider appointments including: 02/10/2019 with cardiology Reviewed recent hospitalization for CHF exacerbation and ED visit 01/30/2019 Encouraged patient to check BP weekly and record to establish a baseline Reviewed recent cardiology and cardiac rehab notes  Patient Self Care Activities:  Self administers medications as prescribed Attends all scheduled provider appointments Calls pharmacy for medication refills Attends church or other social activities Performs ADL's independently Performs IADL's independently Calls provider office for new concerns or questions Attends Cardiac Rehab  Initial goal documentation       Increase water intake      Recommend increasing water  intake to 6-8 glasses a  day.       Patient Stated      Track and Manage Activity and Exertion-Heart Failure      Timeframe:  Short-Term Goal Priority:  High Start Date:    06/06/20                         Expected End Date:  08/07/20                     Follow Up Date: January 2022   - meet with physical therapist - pace activity allowing for rest          Track and Manage Fluids and Swelling-Heart Failure      Timeframe:  Long-Range Goal Priority:  High Start Date:    06/06/20                         Expected End Date: 11/12/20                   Follow Up Date: January 2022   - call office if I gain more than 3 pounds in one day or 5 pounds in one week - keep legs up while sitting - track weight in diary - use salt in moderation - watch for swelling in feet, ankles and legs every day - weigh myself daily         Track and Manage Symptoms-Heart Failure      Timeframe:  Long-Range Goal Priority:  High Start Date:   06/06/20             Expected End Date: 11/12/20                      Follow Up Date: January 2022   - Develop a rescue plan - Eat more whole grains, fruits and vegetables, lean meats and healthy fats - Follow rescue plan if symptoms flare-up - Know when to call the doctor - Track symptoms and what helps feel better or worse - Dress right for the weather, hot or cold           This is a list of the screening recommended for you and due dates:  Health Maintenance  Topic Date Due   DTaP/Tdap/Td vaccine (1 - Tdap) Never done   Zoster (Shingles) Vaccine (1 of 2) Never done   COVID-19 Vaccine (4 - 2023-24 season) 02/13/2022   Mammogram  06/03/2022   Flu Shot  01/14/2023   Medicare Annual Wellness Visit  12/21/2023   Pneumonia Vaccine  Completed   DEXA scan (bone density measurement)  Completed   HPV Vaccine  Aged Out    Advanced directives: yes  Conditions/risks identified: low falls risk  Next appointment: Follow up in one year for your annual wellness visit:  12/22/2023 @ 1pm telephone   Preventive Care 65 Years and Older, Female Preventive care refers to lifestyle choices and visits with your health care provider that can promote health and wellness. What does preventive care include? A yearly physical exam. This is also called an annual well check. Dental exams once or twice a year. Routine eye exams. Ask your health care provider how often you should have your eyes checked. Personal lifestyle choices, including: Daily care of your teeth and gums. Regular physical activity. Eating a healthy diet. Avoiding tobacco and drug use. Limiting alcohol use. Practicing safe sex.  Taking low-dose aspirin every day. Taking vitamin and mineral supplements as recommended by your health care provider. What happens during an annual well check? The services and screenings done by your health care provider during your annual well check will depend on your age, overall health, lifestyle risk factors, and family history of disease. Counseling  Your health care provider may ask you questions about your: Alcohol use. Tobacco use. Drug use. Emotional well-being. Home and relationship well-being. Sexual activity. Eating habits. History of falls. Memory and ability to understand (cognition). Work and work Astronomer. Reproductive health. Screening  You may have the following tests or measurements: Height, weight, and BMI. Blood pressure. Lipid and cholesterol levels. These may be checked every 5 years, or more frequently if you are over 79 years old. Skin check. Lung cancer screening. You may have this screening every year starting at age 77 if you have a 30-pack-year history of smoking and currently smoke or have quit within the past 15 years. Fecal occult blood test (FOBT) of the stool. You may have this test every year starting at age 76. Flexible sigmoidoscopy or colonoscopy. You may have a sigmoidoscopy every 5 years or a colonoscopy every 10 years  starting at age 16. Hepatitis C blood test. Hepatitis B blood test. Sexually transmitted disease (STD) testing. Diabetes screening. This is done by checking your blood sugar (glucose) after you have not eaten for a while (fasting). You may have this done every 1-3 years. Bone density scan. This is done to screen for osteoporosis. You may have this done starting at age 80. Mammogram. This may be done every 1-2 years. Talk to your health care provider about how often you should have regular mammograms. Talk with your health care provider about your test results, treatment options, and if necessary, the need for more tests. Vaccines  Your health care provider may recommend certain vaccines, such as: Influenza vaccine. This is recommended every year. Tetanus, diphtheria, and acellular pertussis (Tdap, Td) vaccine. You may need a Td booster every 10 years. Zoster vaccine. You may need this after age 32. Pneumococcal 13-valent conjugate (PCV13) vaccine. One dose is recommended after age 92. Pneumococcal polysaccharide (PPSV23) vaccine. One dose is recommended after age 14. Talk to your health care provider about which screenings and vaccines you need and how often you need them. This information is not intended to replace advice given to you by your health care provider. Make sure you discuss any questions you have with your health care provider. Document Released: 06/28/2015 Document Revised: 02/19/2016 Document Reviewed: 04/02/2015 Elsevier Interactive Patient Education  2017 [employer].  Fall Prevention in the Home Falls can cause injuries. They can happen to people of all ages. There are many things you can do to make your home safe and to help prevent falls. What can I do on the outside of my home? Regularly fix the edges of walkways and driveways and fix any cracks. Remove anything that might make you trip as you walk through a door, such as a raised step or threshold. Trim any bushes or  trees on the path to your home. Use bright outdoor lighting. Clear any walking paths of anything that might make someone trip, such as rocks or tools. Regularly check to see if handrails are loose or broken. Make sure that both sides of any steps have handrails. Any raised decks and porches should have guardrails on the edges. Have any leaves, snow, or ice cleared regularly. Use sand or salt on walking  paths during winter. Clean up any spills in your garage right away. This includes oil or grease spills. What can I do in the bathroom? Use night lights. Install grab bars by the toilet and in the tub and shower. Do not use towel bars as grab bars. Use non-skid mats or decals in the tub or shower. If you need to sit down in the shower, use a plastic, non-slip stool. Keep the floor dry. Clean up any water that spills on the floor as soon as it happens. Remove soap buildup in the tub or shower regularly. Attach bath mats securely with double-sided non-slip rug tape. Do not have throw rugs and other things on the floor that can make you trip. What can I do in the bedroom? Use night lights. Make sure that you have a light by your bed that is easy to reach. Do not use any sheets or blankets that are too big for your bed. They should not hang down onto the floor. Have a firm chair that has side arms. You can use this for support while you get dressed. Do not have throw rugs and other things on the floor that can make you trip. What can I do in the kitchen? Clean up any spills right away. Avoid walking on wet floors. Keep items that you use a lot in easy-to-reach places. If you need to reach something above you, use a strong step stool that has a grab bar. Keep electrical cords out of the way. Do not use floor polish or wax that makes floors slippery. If you must use wax, use non-skid floor wax. Do not have throw rugs and other things on the floor that can make you trip. What can I do with my  stairs? Do not leave any items on the stairs. Make sure that there are handrails on both sides of the stairs and use them. Fix handrails that are broken or loose. Make sure that handrails are as long as the stairways. Check any carpeting to make sure that it is firmly attached to the stairs. Fix any carpet that is loose or worn. Avoid having throw rugs at the top or bottom of the stairs. If you do have throw rugs, attach them to the floor with carpet tape. Make sure that you have a light switch at the top of the stairs and the bottom of the stairs. If you do not have them, ask someone to add them for you. What else can I do to help prevent falls? Wear shoes that: Do not have high heels. Have rubber bottoms. Are comfortable and fit you well. Are closed at the toe. Do not wear sandals. If you use a stepladder: Make sure that it is fully opened. Do not climb a closed stepladder. Make sure that both sides of the stepladder are locked into place. Ask someone to hold it for you, if possible. Clearly mark and make sure that you can see: Any grab bars or handrails. First and last steps. Where the edge of each step is. Use tools that help you move around (mobility aids) if they are needed. These include: Canes. Walkers. Scooters. Crutches. Turn on the lights when you go into a dark area. Replace any light bulbs as soon as they burn out. Set up your furniture so you have a clear path. Avoid moving your furniture around. If any of your floors are uneven, fix them. If there are any pets around you, be aware of where they are. Review  your medicines with your doctor. Some medicines can make you feel dizzy. This can increase your chance of falling. Ask your doctor what other things that you can do to help prevent falls. This information is not intended to replace advice given to you by your health care provider. Make sure you discuss any questions you have with your health care provider. Document  Released: 03/28/2009 Document Revised: 11/07/2015 Document Reviewed: 07/06/2014 Elsevier Interactive Patient Education  2017 [employer].

## 2022-12-23 ENCOUNTER — Ambulatory Visit: Payer: Medicare Other | Admitting: Physical Therapy

## 2022-12-30 ENCOUNTER — Ambulatory Visit: Payer: Medicare Other | Admitting: Physical Therapy

## 2022-12-30 DIAGNOSIS — M533 Sacrococcygeal disorders, not elsewhere classified: Secondary | ICD-10-CM | POA: Diagnosis not present

## 2022-12-30 DIAGNOSIS — R279 Unspecified lack of coordination: Secondary | ICD-10-CM

## 2022-12-30 DIAGNOSIS — R2689 Other abnormalities of gait and mobility: Secondary | ICD-10-CM
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
## 2022-12-30 NOTE — Patient Instructions (Signed)
Red band at thigh: Use the knot to unloop and loop  ___   Heels together while raising the heel up and down,  Toes spread Ballmounds down the whole time   20 reps  ___  R knee out with band at high Inhale, do nothing, "smell soup" Exhale, move R knee out  20 reps

## 2022-12-31 ENCOUNTER — Other Ambulatory Visit: Payer: Self-pay

## 2022-12-31 DIAGNOSIS — D72829 Elevated white blood cell count, unspecified: Secondary | ICD-10-CM

## 2023-01-01 LAB — CBC WITH DIFFERENTIAL/PLATELET
Basophils Absolute: 0.1 10*3/uL (ref 0.0–0.2)
Basos: 1 %
EOS (ABSOLUTE): 0.1 10*3/uL (ref 0.0–0.4)
Eos: 1 %
Hematocrit: 35.1 % (ref 34.0–46.6)
Hemoglobin: 12.1 g/dL (ref 11.1–15.9)
Immature Grans (Abs): 0 10*3/uL (ref 0.0–0.1)
Immature Granulocytes: 0 %
Lymphocytes Absolute: 7.6 10*3/uL — ABNORMAL HIGH (ref 0.7–3.1)
Lymphs: 56 %
MCH: 30.9 pg (ref 26.6–33.0)
MCHC: 34.5 g/dL (ref 31.5–35.7)
MCV: 90 fL (ref 79–97)
Monocytes Absolute: 0.8 10*3/uL (ref 0.1–0.9)
Monocytes: 6 %
Neutrophils Absolute: 4.9 10*3/uL (ref 1.4–7.0)
Neutrophils: 36 %
Platelets: 182 10*3/uL (ref 150–450)
RBC: 3.92 x10E6/uL (ref 3.77–5.28)
RDW: 13 % (ref 11.7–15.4)
WBC: 13.6 10*3/uL — ABNORMAL HIGH (ref 3.4–10.8)

## 2023-01-06 ENCOUNTER — Ambulatory Visit: Payer: Medicare Other | Admitting: Physical Therapy

## 2023-01-06 ENCOUNTER — Telehealth: Payer: Self-pay | Admitting: Family Medicine

## 2023-01-06 DIAGNOSIS — R279 Unspecified lack of coordination: Secondary | ICD-10-CM

## 2023-01-06 DIAGNOSIS — R2689 Other abnormalities of gait and mobility: Secondary | ICD-10-CM

## 2023-01-06 DIAGNOSIS — M533 Sacrococcygeal disorders, not elsewhere classified: Secondary | ICD-10-CM

## 2023-01-06 NOTE — Telephone Encounter (Signed)
Pt given lab results per notes of Jacquenette Shone, OD on 01/06/23. Pt verbalized understanding.

## 2023-01-06 NOTE — Patient Instructions (Addendum)
  Avoid straining pelvic floor, abdominal muscles , spine  Use log rolling technique instead of getting out of bed with your neck or the sit-up     Log rolling into and out of bed   Log rolling into and out of bed If getting out of bed on R side, Bent knees, scoot hips/ shoulder to L  Raise R arm completely overhead, rolling onto armpit  Then lower bent knees to bed to get into complete side lying position  Then drop legs off bed, and push up onto R elbow/forearm, and use L hand to push onto the bed    Dig elbows and feet to lift hte buttocks and scoot without lifting head
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
## 2023-01-13 ENCOUNTER — Ambulatory Visit: Payer: Medicare Other | Admitting: Physical Therapy

## 2023-01-13 DIAGNOSIS — M533 Sacrococcygeal disorders, not elsewhere classified: Secondary | ICD-10-CM

## 2023-01-13 DIAGNOSIS — R2689 Other abnormalities of gait and mobility: Secondary | ICD-10-CM

## 2023-01-13 DIAGNOSIS — R279 Unspecified lack of coordination: Secondary | ICD-10-CM
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
## 2023-01-20 ENCOUNTER — Ambulatory Visit: Payer: Medicare Other | Attending: Obstetrics and Gynecology | Admitting: Physical Therapy

## 2023-01-20 DIAGNOSIS — R279 Unspecified lack of coordination: Secondary | ICD-10-CM | POA: Insufficient documentation

## 2023-01-20 DIAGNOSIS — M533 Sacrococcygeal disorders, not elsewhere classified: Secondary | ICD-10-CM | POA: Insufficient documentation

## 2023-01-20 DIAGNOSIS — R2689 Other abnormalities of gait and mobility: Secondary | ICD-10-CM | POA: Diagnosis present
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
## 2023-01-20 NOTE — Patient Instructions (Addendum)
Practice coordination  Inhale: notice ribcage expand 360 degrees  and naturally pelvic floor muscles expand like a bowl  Exhale:  allow pelvic floor soften up, hand on low belly sinks first, other hand on upper belly sinks last    ( not "draw abdominal muscle to spine" or strain with abdominal muscles")  nal muscle to spine" or strain with abdominal muscles")

## 2023-01-26 ENCOUNTER — Other Ambulatory Visit: Payer: Self-pay | Admitting: Family Medicine

## 2023-01-26 DIAGNOSIS — K219 Gastro-esophageal reflux disease without esophagitis: Secondary | ICD-10-CM

## 2023-01-27 ENCOUNTER — Ambulatory Visit: Payer: Medicare Other | Admitting: Physical Therapy

## 2023-01-27 DIAGNOSIS — M533 Sacrococcygeal disorders, not elsewhere classified: Secondary | ICD-10-CM | POA: Diagnosis not present

## 2023-01-27 DIAGNOSIS — R279 Unspecified lack of coordination: Secondary | ICD-10-CM

## 2023-01-27 DIAGNOSIS — R2689 Other abnormalities of gait and mobility: Secondary | ICD-10-CM

## 2023-01-27 NOTE — Telephone Encounter (Signed)
Rx 06/01/22 #90 2RF- too soon (9 month supply) Requested Prescriptions  Pending Prescriptions Disp Refills   omeprazole (PRILOSEC) 20 MG capsule [Pharmacy Med Name: OMEPRAZOLE DR 20 MG CAP] 90 capsule 2    Sig: TAKE 1 CAPSULE BY MOUTH ONCE DAILY     Gastroenterology: Proton Pump Inhibitors Passed - 01/26/2023 10:20 AM      Passed - Valid encounter within last 12 months    Recent Outpatient Visits           2 months ago Prediabetes   [facility] [name], PA-C   3 months ago Lower abdominal pain   [facility] Soham, [place], PA-C   6 months ago Acute cystitis with hematuria   [facility] [name], PA-C   6 months ago Hypertension, unspecified type   [facility] [name], PA-C   9 months ago Incontinence of feces, unspecified fecal incontinence type   [facility] [name], PA-C       Future Appointments             In 1 week Cordelia Pen, Joan Mayans, NP [facility] at [facility], [facility]
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
## 2023-01-28 ENCOUNTER — Ambulatory Visit (INDEPENDENT_AMBULATORY_CARE_PROVIDER_SITE_OTHER): Payer: Medicare Other | Admitting: Family Medicine

## 2023-01-28 ENCOUNTER — Encounter: Payer: Self-pay | Admitting: Family Medicine

## 2023-01-28 VITALS — BP 125/54 | HR 75 | Ht 61.0 in | Wt 151.0 lb

## 2023-01-28 DIAGNOSIS — N390 Urinary tract infection, site not specified: Secondary | ICD-10-CM

## 2023-01-28 DIAGNOSIS — R310 Gross hematuria: Secondary | ICD-10-CM | POA: Diagnosis not present

## 2023-01-28 LAB — POCT URINALYSIS DIPSTICK
Bilirubin, UA: NEGATIVE
Glucose, UA: NEGATIVE
Ketones, UA: NEGATIVE
Nitrite, UA: NEGATIVE
Protein, UA: POSITIVE — AB
Spec Grav, UA: 1.01 (ref 1.010–1.025)
Urobilinogen, UA: 0.2 E.U./dL
pH, UA: 7.5 (ref 5.0–8.0)

## 2023-01-28 MED ORDER — DOXYCYCLINE HYCLATE 100 MG PO TABS: 100.0000 mg | ORAL_TABLET | Freq: Two times a day (BID) | ORAL | 0 refills | Status: DC

## 2023-01-28 NOTE — Progress Notes (Signed)
Established patient visit   Patient: Teresa Moyer   DOB: 1936/01/29   87 y.o. Female  MRN: [id number] Visit Date: 01/28/2023  Today's healthcare provider: Jacky Kindle, FNP  Introduced to nurse practitioner role and practice setting.  All questions answered.  Discussed provider/patient relationship and expectations.  Subjective    HPI HPI     Medical Management of Chronic Issues    Additional comments: Possible UTI, frequent urination, hematuria, pressure on the lower abdomen       Last edited by Rolly Salter, CMA on 01/28/2023  2:24 PM.      8/15- woke up with symptoms -blood on poise pad  URINARY SYMPTOMS  Dysuria: yes Urinary frequency: yes Urgency: no Small volume voids: no Symptom severity:  moderate pain- suprabuic pain Urinary incontinence: no Foul odor: no Hematuria: yes Abdominal pain: yes Back pain: no Suprapubic pain/pressure: no Flank pain: no Fever:  no Vomiting: no Relief with cranberry juice: no Relief with pyridium: n/a Status: better/worse/stable Previous urinary tract infection: yes Recurrent urinary tract infection: yes  Medications: Outpatient Medications Prior to Visit  Medication Sig   acetaminophen (TYLENOL) 650 MG CR tablet Take by mouth.   alendronate (FOSAMAX) 70 MG tablet Take 1 tablet (70 mg total) by mouth every 7 (seven) days. Take with a full glass of water on an empty stomach.   aspirin 81 MG chewable tablet Chew 81 mg by mouth daily.   atorvastatin (LIPITOR) 40 MG tablet Take 1 tablet (40 mg total) by mouth daily at 6 PM.   azelastine (ASTELIN) 0.1 % nasal spray Place 1 spray into both nostrils 2 (two) times daily.   Carboxymethylcell-Hypromellose (GENTEAL) 0.25-0.3 % GEL Apply 1 application to eye at bedtime.    carvedilol (COREG) 12.5 MG tablet Take 12.5 mg by mouth 2 (two) times daily with a meal.    cetirizine (ZYRTEC) 10 MG tablet Take 1 tablet (10 mg total) by mouth at bedtime.   fluticasone (FLONASE) 50  MCG/ACT nasal spray Place 1 spray into both nostrils daily.    furosemide (LASIX) 40 MG tablet Take 1 tablet (40 mg total) by mouth 2 (two) times daily.   hydrOXYzine (VISTARIL) 25 MG capsule Take 1 capsule (25 mg total) by mouth 3 (three) times daily as needed.   metolazone (ZAROXOLYN) 2.5 MG tablet Take 1 tablet (2.5 mg total) by mouth every other day as needed (Increased swelling, shortness of breath or over 2 pound weight gain).   Multiple Vitamins-Minerals (PRESERVISION AREDS PO) Take 1 tablet by mouth 2 (two) times daily.    omeprazole (PRILOSEC) 20 MG capsule Take 1 capsule (20 mg total) by mouth daily.   potassium chloride (KLOR-CON) 10 MEQ tablet TAKE 1 TABLET BY MOUTH TWICE DAILY   Probiotic Product (ALIGN) 4 MG CAPS Take 8 mg by mouth daily after lunch.   sacubitril-valsartan (ENTRESTO) 49-51 MG Take 1 tablet by mouth 2 (two) times daily.   senna-docusate (SENOKOT-S) 8.6-50 MG tablet Take 1 tablet by mouth 2 (two) times daily as needed for moderate constipation.   Trospium Chloride 60 MG CP24 Take 1 capsule (60 mg total) by mouth daily.   [DISCONTINUED] doxycycline (VIBRA-TABS) 100 MG tablet Take 1 tablet (100 mg total) by mouth 2 (two) times daily.   No facility-administered medications prior to visit.     Objective    BP (!) 125/54 (BP Location: Left Arm, Patient Position: Sitting, Cuff Size: Large)   Pulse 75   Ht 5\' 1"  (1.549  m)   Wt 151 lb (68.5 kg)   SpO2 99%   BMI 28.53 kg/m   Physical Exam Vitals and nursing note reviewed.  Constitutional:      General: She is not in acute distress.    Appearance: Normal appearance. She is overweight. She is not ill-appearing, toxic-appearing or diaphoretic.  HENT:     Head: Normocephalic and atraumatic.  Cardiovascular:     Rate and Rhythm: Normal rate and regular rhythm.     Pulses: Normal pulses.     Heart sounds: Normal heart sounds. No murmur heard.    No friction rub. No gallop.  Pulmonary:     Effort: Pulmonary effort  is normal. No respiratory distress.     Breath sounds: Normal breath sounds. No stridor. No wheezing, rhonchi or rales.  Chest:     Chest wall: No tenderness.  Abdominal:     General: Bowel sounds are normal.     Palpations: Abdomen is soft.     Tenderness: There is no abdominal tenderness. There is no right CVA tenderness, left CVA tenderness or guarding.  Genitourinary:    Comments: Mild prolapse; getting pelvic PT Recurrent UTI symptoms  Musculoskeletal:        General: No swelling, tenderness, deformity or signs of injury. Normal range of motion.     Right lower leg: No edema.     Left lower leg: No edema.  Skin:    General: Skin is warm and dry.     Capillary Refill: Capillary refill takes less than 2 seconds.     Coloration: Skin is not jaundiced or pale.     Findings: No bruising, erythema, lesion or rash.  Neurological:     General: No focal deficit present.     Mental Status: She is alert and oriented to person, place, and time. Mental status is at baseline.     Cranial Nerves: No cranial nerve deficit.     Sensory: No sensory deficit.     Motor: No weakness.     Coordination: Coordination normal.  Psychiatric:        Mood and Affect: Mood normal.        Behavior: Behavior normal.        Thought Content: Thought content normal.        Judgment: Judgment normal.     Results for orders placed or performed in visit on 01/28/23  POCT Urinalysis Dipstick  Result Value Ref Range   Color, UA red    Clarity, UA cloudy    Glucose, UA Negative Negative   Bilirubin, UA Negative    Ketones, UA Negative    Spec Grav, UA 1.010 1.010 - 1.025   Blood, UA Large    pH, UA 7.5 5.0 - 8.0   Protein, UA Positive (A) Negative   Urobilinogen, UA 0.2 0.2 or 1.0 E.U./dL   Nitrite, UA Negative    Leukocytes, UA Moderate (2+) (A) Negative   Appearance     Odor      Assessment & Plan     Problem List Items Addressed This Visit       Genitourinary   Gross hematuria    Ucx  sent; start ABX Referral recommend to urology       Relevant Orders   Urine Culture   POCT Urinalysis Dipstick (Completed)   Recurrent UTI - Primary    4th UTI this year POC+/Ucx pending       Relevant Medications   doxycycline (VIBRA-TABS) 100 MG  tablet   Other Relevant Orders   Urine Culture   POCT Urinalysis Dipstick (Completed)   Return if symptoms worsen or fail to improve.     Leilani Merl, FNP, have reviewed all documentation for this visit. The documentation on 01/28/23 for the exam, diagnosis, procedures, and orders are all accurate and complete.  Jacky Kindle, FNP  [facility] [phone] (phone) [phone] (fax)  [facility]

## 2023-01-28 NOTE — Assessment & Plan Note (Signed)
4th UTI this year POC+/Ucx pending

## 2023-01-28 NOTE — Assessment & Plan Note (Signed)
Ucx sent; start ABX Referral recommend to urology

## 2023-01-30 LAB — URINE CULTURE

## 2023-02-01 ENCOUNTER — Ambulatory Visit: Payer: Self-pay | Admitting: *Deleted

## 2023-02-01 NOTE — Telephone Encounter (Signed)
Patient advised.

## 2023-02-01 NOTE — Telephone Encounter (Signed)
Urine culture was negative. There is no bacteria to cover with antibiotic.  She can stop now.  Would use AZO and hydrate well. If pain persists, consider urology referral

## 2023-02-01 NOTE — Telephone Encounter (Signed)
Reason for Disposition  [1] Taking antibiotic > 72 hours (3 days) for UTI AND [2] painful urination or frequency is SAME (unchanged, not better)  Answer Assessment - Initial Assessment Questions 1. MAIN SYMPTOM: "What is the main symptom you are concerned about?" (e.g., painful urination, urine frequency)     Patient reports she still feels pressure, sensitivity at urethra, Saturday evening up 5x- did ave pain 2. BETTER-SAME-WORSE: "Are you getting better, staying the same, or getting worse compared to how you felt at your last visit to the doctor (most recent medical visit)?"     A little better- but not as expected 3. PAIN: "How bad is the pain?"  (e.g., Scale 1-10; mild, moderate, or severe)   - MILD (1-3): complains slightly about urination hurting   - MODERATE (4-7): interferes with normal activities     - SEVERE (8-10): excruciating, unwilling or unable to urinate because of the pain      No pain now- pressure 4. FEVER: "Do you have a fever?" If Yes, ask: "What is it, how was it measured, and when did it start?"     No- has checked 5. OTHER SYMPTOMS: "Do you have any other symptoms?" (e.g., blood in the urine, flank pain, vaginal discharge)     Frequency, pressure, some burning/discomfort with urination 6. DIAGNOSIS: "When was the UTI diagnosed?" "By whom?" "Was it a kidney infection, bladder infection or both?"     8/15 7. ANTIBIOTIC: "What antibiotic(s) are you taking?" "How many times per day?"     doxycycline (VIBRA-TABS) 100 MG tablet 8. ANTIBIOTIC - START DATE: "When did you start taking the antibiotic?"     8/15- started that evening  Protocols used: Urinary Tract Infection on Antibiotic Follow-up Call - [facility]

## 2023-02-01 NOTE — Progress Notes (Signed)
Negative urine culture for 1 bacteria.

## 2023-02-01 NOTE — Telephone Encounter (Signed)
  Chief Complaint: urinary symptoms- results, medication questions  Symptoms: Patient was in office 8/15 and treated for UTI. Culture results came back as negative. Patient is still taking antibiotic:doxycycline (VIBRA-TABS) 100 MG tablet . Patient reports she still has symptoms- she has had improvement in hematuria- but reports she had pressure and over the weekend- nocturia with pain. Patient states the antibiotic given is not what she normally takes for UTI and wonders if it is working to clear her UTI symptoms.  Frequency: seen in office 8/15 Pertinent Negatives: Patient denies fever, blood in urine Disposition: [] ED /[] Urgent Care (no appt availability in office) / [] Appointment(In office/virtual)/ []  [facility]/ [] Home Care/ [] Refused Recommended Disposition /[] [facility]/ [x]  Follow-up with PCP Additional Notes: Patient has concerns that she is not fully improved and will be ending antibiotic soon- would like to know what comes anxt if she finishes antibiotic and still has symptoms she is having now. Patient advised I would send her concerns to PCP.

## 2023-02-03 ENCOUNTER — Ambulatory Visit: Payer: Medicare Other | Admitting: Physical Therapy

## 2023-02-03 DIAGNOSIS — M533 Sacrococcygeal disorders, not elsewhere classified: Secondary | ICD-10-CM | POA: Diagnosis not present

## 2023-02-03 DIAGNOSIS — R279 Unspecified lack of coordination: Secondary | ICD-10-CM

## 2023-02-03 DIAGNOSIS — R2689 Other abnormalities of gait and mobility: Secondary | ICD-10-CM

## 2023-02-03 NOTE — Patient Instructions (Signed)
Deep core level 1   (10 reps)   -inhale, feel ribs expand 360 degs -exhale when low hand sink first, then upper hand  QUIET breathing thru the nose   ___  Deep core level 2   ( 6 min)    -inhale, feel ribs expand 360 degs -exhale when low hand sink first, then upper hand -move R knee out 10-15 deg without rocking pelvis   -inhale, feel ribs expand 360 degs -exhale when low hand sink first, then upper hand -move L knee out 10-15 deg without rocking pelvis    QUIET breathing thru the nose
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
## 2023-02-04 ENCOUNTER — Ambulatory Visit (INDEPENDENT_AMBULATORY_CARE_PROVIDER_SITE_OTHER): Payer: Medicare Other | Admitting: Obstetrics and Gynecology

## 2023-02-04 ENCOUNTER — Encounter: Payer: Self-pay | Admitting: Obstetrics and Gynecology

## 2023-02-04 ENCOUNTER — Other Ambulatory Visit: Payer: Self-pay | Admitting: Obstetrics and Gynecology

## 2023-02-04 ENCOUNTER — Other Ambulatory Visit (HOSPITAL_COMMUNITY)
Admission: RE | Admit: 2023-02-04 | Discharge: 2023-02-04 | Disposition: A | Discharge status: Home / Self Care | Payer: Medicare Other | Source: Other Acute Inpatient Hospital | Attending: Obstetrics and Gynecology | Admitting: Obstetrics and Gynecology

## 2023-02-04 VITALS — BP 146/71 | HR 60

## 2023-02-04 DIAGNOSIS — N3281 Overactive bladder: Secondary | ICD-10-CM | POA: Diagnosis not present

## 2023-02-04 DIAGNOSIS — R350 Frequency of micturition: Secondary | ICD-10-CM | POA: Diagnosis not present

## 2023-02-04 DIAGNOSIS — R82998 Other abnormal findings in urine: Secondary | ICD-10-CM

## 2023-02-04 LAB — POCT URINALYSIS DIPSTICK
Bilirubin, UA: NEGATIVE
Glucose, UA: NEGATIVE
Ketones, UA: NEGATIVE
Nitrite, UA: POSITIVE
Protein, UA: NEGATIVE
Spec Grav, UA: 1.015 (ref 1.010–1.025)
Urobilinogen, UA: 0.2 E.U./dL
pH, UA: 6 (ref 5.0–8.0)

## 2023-02-04 LAB — URINALYSIS, ROUTINE W REFLEX MICROSCOPIC
Bilirubin Urine: NEGATIVE
Glucose, UA: NEGATIVE mg/dL
Hgb urine dipstick: NEGATIVE
Ketones, ur: NEGATIVE mg/dL
Nitrite: POSITIVE — AB
Protein, ur: 30 mg/dL — AB
Specific Gravity, Urine: 1.013 (ref 1.005–1.030)
WBC, UA: >50 WBC/hpf (ref 0–5)
pH: 6 (ref 5.0–8.0)

## 2023-02-04 MED ORDER — VIBEGRON 75 MG PO TABS: 75.0000 mg | ORAL_TABLET | Freq: Every day | ORAL | 5 refills | Status: DC

## 2023-02-04 NOTE — Progress Notes (Signed)
[facility] Return Visit  SUBJECTIVE  History of Present Illness: WENDI RIZZARDI is a 87 y.o. female seen in follow-up for OAB and Blood in urine. Plan at last visit was continue Trospium 60mg  ER daily.  Patient reports she feels the medication is not working well.      Past Medical History: Patient  has a past medical history of Cataracts, bilateral, CHF (congestive heart failure) (HCC) (02/2019), Coronary artery disease, Environmental allergies, Family history of adverse reaction to anesthesia, GERD (gastroesophageal reflux disease), Heart murmur (2019), Hemorrhoids, History of chickenpox, Hypertension, Incontinence in female, Macular degeneration, Myocardial infarction (HCC) (01/2019), Osteoarthritis, Skin cancer (12/2017/11-2019), and Type O blood, Rh negative (12/2017).   Past Surgical History: She  has a past surgical history that includes Cosmetic surgery (1946); Skin cancer excision; Abdominal hysterectomy; Tonsillectomy; Mohs surgery (08/2011); Pars plana vitrectomy w/ repair of macular hole; Colonoscopy with propofol (N/A, 08/30/2015); cataract (Bilateral, 2009); Eye surgery (Bilateral, 2009); Eye surgery (2010); Eye surgery (2011); Joint replacement (Right, 06/01/2012); Cholecystectomy (N/A, 01/12/2018); Cholecystectomy, laparoscopic; Mohs surgery; Coronary/Graft Acute MI Revascularization (N/A, 12/27/2018); LEFT HEART CATH AND CORONARY ANGIOGRAPHY (N/A, 12/27/2018); Knee Arthroplasty (Left, 12/20/2019); Esophagogastroduodenoscopy (egd) with propofol (N/A, 01/05/2020); and Flexible sigmoidoscopy (N/A, 07/23/2022).   Medications: She has a current medication list which includes the following prescription(s): acetaminophen, alendronate, aspirin, atorvastatin, azelastine, genteal, carvedilol, cetirizine, doxycycline, fluticasone, furosemide, hydroxyzine, metolazone, multiple vitamins-minerals, omeprazole, potassium chloride, align, entresto, senna-docusate, trospium chloride, and  vibegron.   Allergies: Patient is allergic to accupril [quinapril hcl], sulfa antibiotics, clinoril [sulindac], hydrochlorothiazide, spironolactone, tessalon [benzonatate], amoxicillin, codeine sulfate, and penicillins.   Social History: Patient  reports that she has never smoked. She has never used smokeless tobacco. She reports that she does not drink alcohol and does not use drugs.      OBJECTIVE   POC Urine: Positive for small blood, large leukocytes, and Nitrites (Has been taking AZO)  Physical Exam: Vitals:   02/04/23 1259  BP: (!) 146/71  Pulse: 60   Gen: No apparent distress, A&O x 3.  Detailed Urogynecologic Evaluation:  Deferred.    ASSESSMENT AND PLAN    Ms. Gudger is a 87 y.o. with:  1. Overactive bladder   2. Urinary frequency   3. Leukocytes in urine     Patient reports the trospium is not controlling her symptoms. Will change the medication to [facility] and attempt a prior authorization. As she is over 86 years old, other anticholinergics would not be suggested as they cross the blood brain barrier. Myrbetriq is not suggested as she is on a beta blocker. Will try for Tier exception if needed. Patient had a culture done on 8/15 that did not show a UTI. She has been taking Azo. Will send urine for micro and culture to rule out UTI.  Will await culture results before treating with antibiotics.    Patient to follow up in 6 weeks or sooner if needed.

## 2023-02-04 NOTE — Patient Instructions (Signed)
Stop the Trospium and start Gemtesa 75mg .   Follow up in 6 weeks for medication follow up.

## 2023-02-05 MED ORDER — NITROFURANTOIN MONOHYD MACRO 100 MG PO CAPS: 100.0000 mg | ORAL_CAPSULE | Freq: Two times a day (BID) | ORAL | 0 refills | Status: AC

## 2023-02-05 NOTE — Addendum Note (Signed)
Addended by: Selmer Dominion on: 02/05/2023 12:13 PM   Modules accepted: Orders

## 2023-02-06 LAB — URINE CULTURE: Culture: >=100000 — AB

## 2023-02-10 ENCOUNTER — Ambulatory Visit: Payer: Medicare Other | Admitting: Physical Therapy

## 2023-02-18 ENCOUNTER — Ambulatory Visit: Payer: Medicare Other | Admitting: Physical Therapy

## 2023-02-24 ENCOUNTER — Ambulatory Visit (INDEPENDENT_AMBULATORY_CARE_PROVIDER_SITE_OTHER): Payer: Medicare Other

## 2023-02-24 ENCOUNTER — Ambulatory Visit: Payer: Medicare Other | Attending: Obstetrics and Gynecology | Admitting: Physical Therapy

## 2023-02-24 DIAGNOSIS — M533 Sacrococcygeal disorders, not elsewhere classified: Secondary | ICD-10-CM | POA: Insufficient documentation

## 2023-02-24 DIAGNOSIS — R279 Unspecified lack of coordination: Secondary | ICD-10-CM | POA: Insufficient documentation

## 2023-02-24 DIAGNOSIS — R2689 Other abnormalities of gait and mobility: Secondary | ICD-10-CM | POA: Diagnosis present

## 2023-02-24 DIAGNOSIS — Z23 Encounter for immunization: Secondary | ICD-10-CM | POA: Diagnosis not present
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
## 2023-03-02 ENCOUNTER — Encounter: Payer: Medicare Other | Admitting: Physical Therapy

## 2023-03-03 ENCOUNTER — Ambulatory Visit: Payer: Medicare Other | Admitting: Physical Therapy

## 2023-03-03 DIAGNOSIS — M533 Sacrococcygeal disorders, not elsewhere classified: Secondary | ICD-10-CM | POA: Diagnosis not present

## 2023-03-03 DIAGNOSIS — R279 Unspecified lack of coordination: Secondary | ICD-10-CM

## 2023-03-03 DIAGNOSIS — R2689 Other abnormalities of gait and mobility: Secondary | ICD-10-CM
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
## 2023-03-04 NOTE — Patient Instructions (Signed)
   Clam Shell 45 Degrees  Lying with hips and knees bent 45, one pillow between knees and ankles. Heel together, toes apart like ballerina,  Lift knee with exhale while pressing heels together. Be sure pelvis does not roll backward. Do not arch back. Do 20 times, each leg, 2 times per day.   __  Stepping backward with rollator, bend toe extensions20 reps

## 2023-03-10 ENCOUNTER — Other Ambulatory Visit: Payer: Self-pay | Admitting: Physician Assistant

## 2023-03-10 ENCOUNTER — Ambulatory Visit: Payer: Medicare Other | Admitting: Physical Therapy

## 2023-03-10 DIAGNOSIS — R279 Unspecified lack of coordination: Secondary | ICD-10-CM

## 2023-03-10 DIAGNOSIS — M533 Sacrococcygeal disorders, not elsewhere classified: Secondary | ICD-10-CM | POA: Diagnosis not present

## 2023-03-10 DIAGNOSIS — R2689 Other abnormalities of gait and mobility: Secondary | ICD-10-CM

## 2023-03-10 DIAGNOSIS — I5043 Acute on chronic combined systolic (congestive) and diastolic (congestive) heart failure: Secondary | ICD-10-CM
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
## 2023-03-18 ENCOUNTER — Ambulatory Visit: Payer: Medicare Other | Admitting: Obstetrics and Gynecology

## 2023-03-18 ENCOUNTER — Encounter: Payer: Self-pay | Admitting: Obstetrics and Gynecology

## 2023-03-18 VITALS — BP 161/74 | HR 68

## 2023-03-18 DIAGNOSIS — N3281 Overactive bladder: Secondary | ICD-10-CM | POA: Diagnosis not present

## 2023-03-18 LAB — POCT URINALYSIS DIPSTICK
Bilirubin, UA: NEGATIVE
Glucose, UA: NEGATIVE
Ketones, UA: NEGATIVE
Leukocytes, UA: NEGATIVE
Nitrite, UA: NEGATIVE
Protein, UA: NEGATIVE
Spec Grav, UA: 1.015 (ref 1.010–1.025)
Urobilinogen, UA: 0.2 U/dL
pH, UA: 6 (ref 5.0–8.0)

## 2023-03-18 NOTE — Progress Notes (Signed)
Teresa Moyer Urogynecology Return Visit  SUBJECTIVE  History of Present Illness: Teresa Moyer is a 87 y.o. female seen in follow-up for overactive bladder and urinary frequency. Plan at last visit was start Gemtesa 75 mg daily.   Patient endorses that Leslye Peer seems to been helping her to control her bladder and that she can make it to the bathroom a little bit easier.  She endorses that she did have some GI side effects in the beginning but they have since resolved.   Patient reports that she has completed pelvic floor PT.   Past Medical History: Patient  has a past medical history of Cataracts, bilateral, CHF (congestive heart failure) (HCC) (02/2019), Coronary artery disease, Environmental allergies, Family history of adverse reaction to anesthesia, GERD (gastroesophageal reflux disease), Heart murmur (2019), Hemorrhoids, History of chickenpox, Hypertension, Incontinence in female, Macular degeneration, Myocardial infarction (HCC) (01/2019), Osteoarthritis, Skin cancer (12/2017/11-2019), and Type O blood, Rh negative (12/2017).   Past Surgical History: She  has a past surgical history that includes Cosmetic surgery (1946); Skin cancer excision; Abdominal hysterectomy; Tonsillectomy; Mohs surgery (08/2011); Pars plana vitrectomy w/ repair of macular hole; Colonoscopy with propofol (N/A, 08/30/2015); cataract (Bilateral, 2009); Eye surgery (Bilateral, 2009); Eye surgery (2010); Eye surgery (2011); Joint replacement (Right, 06/01/2012); Cholecystectomy (N/A, 01/12/2018); Cholecystectomy, laparoscopic; Mohs surgery; Coronary/Graft Acute MI Revascularization (N/A, 12/27/2018); LEFT HEART CATH AND CORONARY ANGIOGRAPHY (N/A, 12/27/2018); Knee Arthroplasty (Left, 12/20/2019); Esophagogastroduodenoscopy (egd) with propofol (N/A, 01/05/2020); and Flexible sigmoidoscopy (N/A, 07/23/2022).   Medications: She has a current medication list which includes the following prescription(s): acetaminophen, alendronate,  aspirin, atorvastatin, azelastine, genteal, carvedilol, cetirizine, doxycycline, fluticasone, furosemide, hydroxyzine, metolazone, multiple vitamins-minerals, omeprazole, potassium chloride, align, entresto, senna-docusate, trospium chloride, and vibegron.   Allergies: Patient is allergic to accupril [quinapril hcl], sulfa antibiotics, clinoril [sulindac], hydrochlorothiazide, spironolactone, tessalon [benzonatate], amoxicillin, codeine sulfate, and penicillins.   Social History: Patient  reports that she has never smoked. She has never used smokeless tobacco. She reports that she does not drink alcohol and does not use drugs.      OBJECTIVE   POC:  Lab Results  Component Value Date   COLORU yellow 03/18/2023   CLARITYU clear 03/18/2023   GLUCOSEUR Negative 03/18/2023   BILIRUBINUR negative 03/18/2023   KETONESU negative 03/18/2023   SPECGRAV 1.015 03/18/2023   RBCUR trace 03/18/2023   PHUR 6.0 03/18/2023   PROTEINUR Negative 03/18/2023   UROBILINOGEN 0.2 03/18/2023   LEUKOCYTESUR Negative 03/18/2023     Physical Exam: Vitals:   03/18/23 1412 03/18/23 1420  BP: (!) 175/80 (!) 161/74  Pulse: 68    Gen: No apparent distress, A&O x 3.  Detailed Urogynecologic Evaluation:  Deferred.    ASSESSMENT AND PLAN    Teresa Moyer is a 87 y.o. with:  1. Overactive bladder    Will plan for patient to continue on Gemtesa 75 mg daily.  She is not a candidate for other anticholinergic medications (such as oxybutynin, Detrol, Vesicare, Solifenacin) as they pose a risk of dry mouth dry eyes constipation ambulatory following and are currently on the beers list.  Due to patient's elevated blood pressure she is not a candidate for Myrbetriq.  Will attempt to get coverage for medication through her insurance as this is assisting her quality of life.  Patient to follow-up in 6 months or sooner if needed

## 2023-04-14 ENCOUNTER — Encounter: Payer: Medicare Other | Admitting: Physical Therapy

## 2023-04-26 ENCOUNTER — Other Ambulatory Visit: Payer: Self-pay | Admitting: Family Medicine

## 2023-04-26 DIAGNOSIS — K219 Gastro-esophageal reflux disease without esophagitis: Secondary | ICD-10-CM

## 2023-05-19 ENCOUNTER — Ambulatory Visit: Payer: Medicare Other | Admitting: Family Medicine

## 2023-05-19 ENCOUNTER — Encounter: Payer: Self-pay | Admitting: Family Medicine

## 2023-05-19 VITALS — BP 157/68 | HR 58 | Ht 59.0 in | Wt 151.0 lb

## 2023-05-19 DIAGNOSIS — I25118 Atherosclerotic heart disease of native coronary artery with other forms of angina pectoris: Secondary | ICD-10-CM

## 2023-05-19 DIAGNOSIS — R7303 Prediabetes: Secondary | ICD-10-CM | POA: Diagnosis not present

## 2023-05-19 DIAGNOSIS — H3530 Unspecified macular degeneration: Secondary | ICD-10-CM

## 2023-05-19 DIAGNOSIS — I5022 Chronic systolic (congestive) heart failure: Secondary | ICD-10-CM | POA: Diagnosis not present

## 2023-05-19 DIAGNOSIS — R7989 Other specified abnormal findings of blood chemistry: Secondary | ICD-10-CM

## 2023-05-19 DIAGNOSIS — Z0001 Encounter for general adult medical examination with abnormal findings: Secondary | ICD-10-CM | POA: Diagnosis not present

## 2023-05-19 DIAGNOSIS — Z0000 Encounter for general adult medical examination without abnormal findings: Secondary | ICD-10-CM

## 2023-05-19 DIAGNOSIS — I10 Essential (primary) hypertension: Secondary | ICD-10-CM

## 2023-05-19 DIAGNOSIS — M1990 Unspecified osteoarthritis, unspecified site: Secondary | ICD-10-CM

## 2023-05-19 NOTE — Patient Instructions (Signed)
Check your blood pressure once daily, and any time you have concerning symptoms like headache, chest pain, dizziness, shortness of breath, or vision changes.   Our goal is less than 140/90.  To appropriately check your blood pressure, make sure you do the following:  1) Avoid caffeine, exercise, or tobacco products for 30 minutes before checking. Empty your bladder. 2) Sit with your back supported in a flat-backed chair. Rest your arm on something flat (arm of the chair, table, etc). 3) Sit still with your feet flat on the floor, resting, for at least 5 minutes.  4) Check your blood pressure. Take 1-2 readings.  5) Write down these readings and bring with you to any provider appointments.  Bring your home blood pressure machine with you to a provider's office for accuracy comparison at least once a year.   Make sure you take your blood pressure medications before you come to any office visit, even if you were asked to fast for labs.

## 2023-05-19 NOTE — Progress Notes (Signed)
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 unspecified type Assessment & Plan: Ongoing treatment with eye injections every seven weeks. No new vision changes reported. - Continue regular eye injections as scheduled    Return in about 6 months (around 11/17/2023).     I discussed the assessment and treatment plan with the patient  The patient was provided an opportunity to ask questions and all were answered. The patient agreed with the plan and demonstrated an understanding of the instructions.   The patient was advised to call back or seek an in-person evaluation if the symptoms worsen or if the condition fails to improve as anticipated.    Sherlyn Hay, DO  [facility] [phone] (phone) [phone] (fax)  [facility]

## 2023-05-19 NOTE — Assessment & Plan Note (Addendum)
Last echocardiogram done 05/14/2021 with EF 30 to 35%, grade 2 diastolic dysfunction and severe aortic valve stenosis.  Heart failure with previous hospitalizations. Currently no chest pain, dyspnea, or edema. Adheres to a no-salt diet and daily exercises. Reports weight gain during Thanksgiving but generally maintains stable weight. - Continue current exercise regimen - Maintain no-salt diet - Monitor weight regularly - Follow up with cardiology as scheduled

## 2023-05-29 ENCOUNTER — Other Ambulatory Visit: Payer: Self-pay | Admitting: Physician Assistant

## 2023-05-31 NOTE — Telephone Encounter (Signed)
Requested Prescriptions  Pending Prescriptions Disp Refills   alendronate (FOSAMAX) 70 MG tablet [Pharmacy Med Name: ALENDRONATE SODIUM 70 MG TAB] 4 tablet 11    Sig: TAKE ONE TABLET BY MOUTH EACH WEEK, ON AN EMPTY STOMACH BEFORE BREAKFAST WITH 8oz OF WATER AND REMAIN UPRIGHT FOR :30     Endocrinology:  Bisphosphonates Failed - 05/31/2023 12:59 PM      Failed - Vitamin D in normal range and within 360 days    No results found for: "IO9629BM8", "UX3244WN0", "UV253GU4QIH", "25OHVITD3", "25OHVITD2", "25OHVITD1", "VD25OH"       Failed - Mg Level in normal range and within 360 days    Magnesium  Date Value Ref Range Status  05/16/2021 2.3 1.7 - 2.4 mg/dL Final    Comment:    Performed at [facility], [address] [place], [place] [address]         Failed - Phosphate in normal range and within 360 days    Phosphorus  Date Value Ref Range Status  05/16/2021 4.4 2.5 - 4.6 mg/dL Final         Passed - Ca in normal range and within 360 days    Calcium  Date Value Ref Range Status  11/26/2022 9.3 8.7 - 10.3 mg/dL Final   Calcium, Ion  Date Value Ref Range Status  12/20/2019 1.23 1.15 - 1.40 mmol/L Final         Passed - Cr in normal range and within 360 days    Creatinine, Ser  Date Value Ref Range Status  11/26/2022 0.88 0.57 - 1.00 mg/dL Final         Passed - eGFR is 30 or above and within 360 days    GFR calc Af Amer  Date Value Ref Range Status  07/08/2020 73 >59 mL/min/1.73 Final    Comment:    **In accordance with recommendations from the NKF-ASN Task force,**   [facility] is in the process of updating its eGFR calculation to the   2021 CKD-EPI creatinine equation that estimates kidney function   without a race variable.    GFR, Estimated  Date Value Ref Range Status  04/14/2022 >60 >60 mL/min Final    Comment:    (NOTE) Calculated using the CKD-EPI Creatinine Equation (2021)    eGFR  Date Value Ref Range Status  11/26/2022 64 >59 mL/min/1.73 Final          Passed - Valid encounter within last 12 months    Recent Outpatient Visits           1 week ago Coronary artery disease of native artery of native heart with stable angina pectoris [facility])   [facility] [place], Monico Blitz, DO   4 months ago Recurrent UTI   [facility] Jacky Kindle, FNP   6 months ago Prediabetes   [facility] [name], PA-C   7 months ago Lower abdominal pain   [facility], [place], PA-C   10 months ago Acute cystitis with hematuria   [facility] [name], PA-C       Future Appointments             In 5 months Pardue, Monico Blitz, DO [facility]            Passed - Bone Mineral Density or Dexa Scan completed in the last 2 years

## 2023-06-08 DIAGNOSIS — H3530 Unspecified macular degeneration: Secondary | ICD-10-CM | POA: Insufficient documentation

## 2023-06-08 DIAGNOSIS — Z0000 Encounter for general adult medical examination without abnormal findings: Secondary | ICD-10-CM | POA: Insufficient documentation

## 2023-06-08 NOTE — Assessment & Plan Note (Signed)
Discussed risk versus benefit of maintaining a low-carb diet versus a regular diet and weighing it against potential effect on quality versus quantity of life, given patient's advanced age. Continue moderately low-carb diet with occasional exception of her partial chocolate bar.

## 2023-06-08 NOTE — Assessment & Plan Note (Signed)
Blood pressure readings at home are generally within acceptable range (110-120/60-70). Slightly elevated in the office, likely due to anxiety. Continues to take potassium. - Continue current antihypertensive medications - Monitor blood pressure at home and record readings

## 2023-06-08 NOTE — Assessment & Plan Note (Addendum)
Physical exam overall unremarkable except as noted above. Routine lab work up-to-date. Received initial COVID-19 vaccinations and one booster but declines further boosters due to side effect concerns. Received shingles vaccine but needs confirmation of the second dose. Due for tetanus vaccine. - Confirm shingles vaccine status - Check insurance coverage for tetanus vaccine and arrange administration if covered - Encourage continued avoidance of COVID-19 exposure

## 2023-06-08 NOTE — Assessment & Plan Note (Signed)
Noted.  No acute concerns.  Follows with cardiology.  Will defer to specialist management. - Continue aspirin 81 mg daily, atorvastatin 40 mg daily, carvedilol 12.5 mg twice daily and Entresto 49-51 mg twice daily.

## 2023-06-08 NOTE — Assessment & Plan Note (Signed)
Stable; no acute concerns.  Will check TSH on follow-up visit.

## 2023-06-08 NOTE — Assessment & Plan Note (Signed)
Ongoing treatment with eye injections every seven weeks. No new vision changes reported. - Continue regular eye injections as scheduled

## 2023-06-08 NOTE — Assessment & Plan Note (Signed)
Chronic arthritis with pain in the back (between shoulder blades) and right knee. Partial knee replacement history. Symptoms exacerbated by physical therapy. Prefers not to continue with the previous therapist due to discomfort. - Encourage posture correction exercises - Avoid further sessions with the previous therapist - Consider alternative pain management strategies if necessary

## 2023-07-27 ENCOUNTER — Other Ambulatory Visit: Payer: Self-pay | Admitting: Family Medicine

## 2023-07-27 DIAGNOSIS — K219 Gastro-esophageal reflux disease without esophagitis: Secondary | ICD-10-CM

## 2023-08-14 ENCOUNTER — Encounter: Payer: Self-pay | Admitting: Obstetrics and Gynecology

## 2023-08-16 ENCOUNTER — Other Ambulatory Visit: Payer: Self-pay

## 2023-08-16 DIAGNOSIS — N3281 Overactive bladder: Secondary | ICD-10-CM

## 2023-08-16 MED ORDER — TROSPIUM CHLORIDE ER 60 MG PO CP24
60.0000 mg | ORAL_CAPSULE | Freq: Every day | ORAL | 3 refills | Status: DC
Start: 2023-08-16 — End: 2023-09-16

## 2023-08-16 NOTE — Telephone Encounter (Signed)
 Patient called the office to requesting medication to be sent to [facility] for a 3 month supply of Gemtesa.

## 2023-08-16 NOTE — Telephone Encounter (Signed)
 Medication has been sent to [facility].

## 2023-08-17 ENCOUNTER — Other Ambulatory Visit: Payer: Self-pay

## 2023-08-17 DIAGNOSIS — N3281 Overactive bladder: Secondary | ICD-10-CM

## 2023-08-17 DIAGNOSIS — R350 Frequency of micturition: Secondary | ICD-10-CM

## 2023-08-23 ENCOUNTER — Other Ambulatory Visit: Payer: Self-pay

## 2023-08-23 DIAGNOSIS — R350 Frequency of micturition: Secondary | ICD-10-CM

## 2023-08-23 DIAGNOSIS — N3281 Overactive bladder: Secondary | ICD-10-CM

## 2023-08-23 MED ORDER — VIBEGRON 75 MG PO TABS: 75.0000 mg | ORAL_TABLET | Freq: Every day | ORAL | 3 refills | Status: DC

## 2023-08-23 NOTE — Progress Notes (Signed)
 Pt said she prefers to go back on Gemtesa because Trospium causes cognitive impairment. Pt requesting a 90 day supply.  Waldo Laine was notified and agreed.

## 2023-08-24 NOTE — Telephone Encounter (Signed)
 Patient Teresa Moyer medication sent to the pharmacy.

## 2023-09-01 ENCOUNTER — Other Ambulatory Visit: Payer: Self-pay | Admitting: Physician Assistant

## 2023-09-04 ENCOUNTER — Other Ambulatory Visit: Payer: Self-pay | Admitting: Physician Assistant

## 2023-09-06 ENCOUNTER — Other Ambulatory Visit: Payer: Self-pay | Admitting: Family Medicine

## 2023-09-06 NOTE — Telephone Encounter (Unsigned)
 Copied from CRM [id number]. Topic: Clinical - Medication Refill >> Sep 06, 2023  2:22 PM Carlatta H wrote: Most Recent Primary Care Visit:  Provider: Sherlyn Hay  Department: [id number]  Visit Type: PHYSICAL  Date: 05/19/2023  Medication: alendronate (FOSAMAX) 70 MG tablet [[id number]]  Has the patient contacted their pharmacy? No (Agent: If no, request that the patient contact the pharmacy for the refill. If patient does not wish to contact the pharmacy document the reason why and proceed with request.) (Agent: If yes, when and what did the pharmacy advise?)  Is this the correct pharmacy for this prescription? Yes If no, delete pharmacy and type the correct one.  This is the patient's preferred pharmacy:  [facility] - [place], [place] - [address] [place] [address] Phone: [phone] Fax: [phone]    Has the prescription been filled recently? No  Is the patient out of the medication? Yes  Has the patient been seen for an appointment in the last year OR does the patient have an upcoming appointment? Yes  Can we respond through MyChart? No  Agent: Please be advised that Rx refills may take up to 3 business days. We ask that you follow-up with your pharmacy.

## 2023-09-07 ENCOUNTER — Other Ambulatory Visit: Payer: Self-pay

## 2023-09-08 MED ORDER — ALENDRONATE SODIUM 70 MG PO TABS: 70.0000 mg | ORAL_TABLET | ORAL | 0 refills | Status: DC

## 2023-09-08 NOTE — Telephone Encounter (Signed)
 Requested Prescriptions  Pending Prescriptions Disp Refills   alendronate (FOSAMAX) 70 MG tablet 12 tablet 0    Sig: Take 1 tablet (70 mg total) by mouth every 7 (seven) days. Take with a full glass of water on an empty stomach.     Endocrinology:  Bisphosphonates Failed - 09/08/2023  9:11 AM      Failed - Vitamin D in normal range and within 360 days    No results found for: "IH4742VZ5", "GL8756EP3", "VD125OH2TOT", "25OHVITD3", "25OHVITD2", "25OHVITD1", "VD25OH"       Failed - Mg Level in normal range and within 360 days    Magnesium  Date Value Ref Range Status  05/16/2021 2.3 1.7 - 2.4 mg/dL Final    Comment:    Performed at [facility], [address] [place], [place] [address]         Failed - Phosphate in normal range and within 360 days    Phosphorus  Date Value Ref Range Status  05/16/2021 4.4 2.5 - 4.6 mg/dL Final         Failed - Bone Mineral Density or Dexa Scan completed in the last 2 years      Passed - Ca in normal range and within 360 days    Calcium  Date Value Ref Range Status  11/26/2022 9.3 8.7 - 10.3 mg/dL Final   Calcium, Ion  Date Value Ref Range Status  12/20/2019 1.23 1.15 - 1.40 mmol/L Final         Passed - Cr in normal range and within 360 days    Creatinine, Ser  Date Value Ref Range Status  11/26/2022 0.88 0.57 - 1.00 mg/dL Final         Passed - eGFR is 30 or above and within 360 days    GFR calc Af Amer  Date Value Ref Range Status  07/08/2020 73 >59 mL/min/1.73 Final    Comment:    **In accordance with recommendations from the NKF-ASN Task force,**   [facility] is in the process of updating its eGFR calculation to the   2021 CKD-EPI creatinine equation that estimates kidney function   without a race variable.    GFR, Estimated  Date Value Ref Range Status  04/14/2022 >60 >60 mL/min Final    Comment:    (NOTE) Calculated using the CKD-EPI Creatinine Equation (2021)    eGFR  Date Value Ref Range Status  11/26/2022 64  >59 mL/min/1.73 Final         Passed - Valid encounter within last 12 months    Recent Outpatient Visits           3 months ago Annual physical exam   [facility] Sherlyn Hay, DO   7 months ago Recurrent UTI   [facility] Jacky Kindle, FNP   9 months ago Prediabetes   [facility] [name], PA-C   10 months ago Lower abdominal pain   [facility] [name], PA-C   1 year ago Acute cystitis with hematuria   [facility] [name], PA-C       Future Appointments             In 1 week Cordelia Pen, Joan Mayans, NP [facility]   In 2 months Pardue, Monico Blitz, DO [facility]

## 2023-09-16 ENCOUNTER — Ambulatory Visit (INDEPENDENT_AMBULATORY_CARE_PROVIDER_SITE_OTHER): Admitting: Obstetrics and Gynecology

## 2023-09-16 VITALS — BP 158/80 | HR 76

## 2023-09-16 DIAGNOSIS — N3281 Overactive bladder: Secondary | ICD-10-CM

## 2023-09-16 DIAGNOSIS — R350 Frequency of micturition: Secondary | ICD-10-CM

## 2023-09-16 LAB — POCT URINALYSIS DIPSTICK
Bilirubin, UA: NEGATIVE
Blood, UA: NEGATIVE
Glucose, UA: NEGATIVE
Ketones, UA: NEGATIVE
Leukocytes, UA: NEGATIVE
Nitrite, UA: NEGATIVE
Protein, UA: NEGATIVE
Spec Grav, UA: 1.015 (ref 1.010–1.025)
Urobilinogen, UA: 0.2 U/dL
pH, UA: 7 (ref 5.0–8.0)

## 2023-09-16 MED ORDER — VIBEGRON 75 MG PO TABS: 75.0000 mg | ORAL_TABLET | Freq: Every day | ORAL | 3 refills | Status: DC

## 2023-09-16 NOTE — Progress Notes (Signed)
 [facility] Return Visit  SUBJECTIVE  History of Present Illness: Teresa Moyer is a 88 y.o. female seen in follow-up for OAB. Plan at last visit was to continue Vibegron 75mg  daily.   Patient reports she has not had any UTI's in the past 6 months which she is happy about. She does report she is going to the bathroom every to 1 hour to ensure she has no leakage.   She reports she is content with this.    Past Medical History: Patient  has a past medical history of Cataracts, bilateral, CHF (congestive heart failure) (HCC) (02/2019), Coronary artery disease, Environmental allergies, Family history of adverse reaction to anesthesia, GERD (gastroesophageal reflux disease), Heart murmur (2019), Hemorrhoids, History of chickenpox, Hypertension, Incontinence in female, Macular degeneration, Myocardial infarction (HCC) (01/2019), Osteoarthritis, Skin cancer (12/2017/11-2019), and Type O blood, Rh negative (12/2017).   Past Surgical History: She  has a past surgical history that includes Cosmetic surgery (1946); Skin cancer excision; Abdominal hysterectomy; Tonsillectomy; Mohs surgery (08/2011); Pars plana vitrectomy w/ repair of macular hole; Colonoscopy with propofol (N/A, 08/30/2015); cataract (Bilateral, 2009); Eye surgery (Bilateral, 2009); Eye surgery (2010); Eye surgery (2011); Joint replacement (Right, 06/01/2012); Cholecystectomy (N/A, 01/12/2018); Cholecystectomy, laparoscopic; Mohs surgery; Coronary/Graft Acute MI Revascularization (N/A, 12/27/2018); LEFT HEART CATH AND CORONARY ANGIOGRAPHY (N/A, 12/27/2018); Knee Arthroplasty (Left, 12/20/2019); Esophagogastroduodenoscopy (egd) with propofol (N/A, 01/05/2020); and Flexible sigmoidoscopy (N/A, 07/23/2022).   Medications: She has a current medication list which includes the following prescription(s): acetaminophen, alendronate, aspirin, atorvastatin, azelastine, genteal, carvedilol, cetirizine, fluticasone, furosemide, hydroxyzine,  metolazone, multiple vitamins-minerals, omeprazole, potassium chloride, align, entresto, senna-docusate, and vibegron.   Allergies: Patient is allergic to accupril [quinapril hcl], sulfa antibiotics, clinoril [sulindac], hydrochlorothiazide, spironolactone, tessalon [benzonatate], amoxicillin, codeine sulfate, and penicillins.   Social History: Patient  reports that she has never smoked. She has never used smokeless tobacco. She reports that she does not drink alcohol and does not use drugs.     OBJECTIVE    Lab Results  Component Value Date   COLORU yellow 09/16/2023   CLARITYU clear 09/16/2023   GLUCOSEUR Negative 09/16/2023   BILIRUBINUR negative 09/16/2023   KETONESU negative 09/16/2023   SPECGRAV 1.015 09/16/2023   RBCUR negative 09/16/2023   PHUR 7.0 09/16/2023   PROTEINUR Negative 09/16/2023   UROBILINOGEN 0.2 09/16/2023   LEUKOCYTESUR Negative 09/16/2023     Physical Exam: Vitals:   09/16/23 1313  BP: (!) 158/80  Pulse: 76   Gen: No apparent distress, A&O x 3.  Detailed Urogynecologic Evaluation:  Deferred.    ASSESSMENT AND PLAN    Ms. Dano is a 88 y.o. with:  1. Urinary frequency   2. Overactive bladder    Patient reports she is content with where she is at. We discussed that the goal would be to be able to go up to 2 hours between urination attempts. We discussed trying to stretch out her voiding times to see if this is possible. We also discussed trying to decrease her bladder irritants again as she is drinking diet soda and tea with only about 16-20oz of water daily. She is not interested in tertiary therapies at this time. Urine negative today for infection. We discussed that with her heart failure (most recent EF 35% and on 40mg  lasix) having realistic expectations of fluid control and the frequency the lasix will cause. She reports that is why she is content with urinating every to an hour at this time. I encouraged her to try the bladder  training and try to stretch it out to 75-90 minutes if she can for quality of life.    Patient to follow up in 6 months or sooner if needed.   Selmer Dominion, NP

## 2023-10-07 ENCOUNTER — Other Ambulatory Visit: Payer: Self-pay | Admitting: Family Medicine

## 2023-10-07 DIAGNOSIS — K219 Gastro-esophageal reflux disease without esophagitis: Secondary | ICD-10-CM

## 2023-11-17 ENCOUNTER — Ambulatory Visit: Payer: Self-pay | Admitting: Family Medicine

## 2023-11-17 ENCOUNTER — Encounter: Payer: Self-pay | Admitting: Family Medicine

## 2023-11-17 VITALS — BP 151/60 | HR 61 | Resp 16 | Ht 59.0 in | Wt 153.7 lb

## 2023-11-17 DIAGNOSIS — R7303 Prediabetes: Secondary | ICD-10-CM

## 2023-11-17 DIAGNOSIS — E538 Deficiency of other specified B group vitamins: Secondary | ICD-10-CM | POA: Diagnosis not present

## 2023-11-17 DIAGNOSIS — I10 Essential (primary) hypertension: Secondary | ICD-10-CM

## 2023-11-17 DIAGNOSIS — E782 Mixed hyperlipidemia: Secondary | ICD-10-CM | POA: Diagnosis not present

## 2023-11-17 DIAGNOSIS — Z23 Encounter for immunization: Secondary | ICD-10-CM

## 2023-11-17 DIAGNOSIS — Z5320 Procedure and treatment not carried out because of patient's decision for unspecified reasons: Secondary | ICD-10-CM

## 2023-11-17 DIAGNOSIS — R7989 Other specified abnormal findings of blood chemistry: Secondary | ICD-10-CM

## 2023-11-17 DIAGNOSIS — K219 Gastro-esophageal reflux disease without esophagitis: Secondary | ICD-10-CM

## 2023-11-17 DIAGNOSIS — I5022 Chronic systolic (congestive) heart failure: Secondary | ICD-10-CM

## 2023-11-17 MED ORDER — TETANUS-DIPHTH-ACELL PERTUSSIS 5-2.5-18.5 LF-MCG/0.5 IM SUSP
0.5000 mL | Freq: Once | INTRAMUSCULAR | 0 refills | Status: AC
Start: 2023-11-17 — End: 2023-11-17

## 2023-11-17 MED ORDER — SHINGRIX 50 MCG/0.5ML IM SUSR: 0.5000 mL | Freq: Once | INTRAMUSCULAR | 0 refills | Status: AC

## 2023-11-17 NOTE — Progress Notes (Unsigned)
 Established patient visit   Patient: Teresa Moyer   DOB: Aug 12, 1935   88 y.o. Female  MRN: [id number] Visit Date: 11/17/2023  Today's healthcare provider: Carlean Charter, DO   No chief complaint on file.  Subjective    HPI Last annual exam: 05/19/2023   Teresa BASSO "Clide Dalton" is an 88 year old female who presents for a regular follow-up visit.  She has questions regarding the shingles vaccine, having received the first shot in March 2023. She was not informed that a second shot was necessary and is concerned about whether she needs to restart the series. She plans to get the second shot after her eye shots scheduled for tomorrow.  She notes a weight increase of two pounds, which she monitors daily. She attributes fluctuations to dietary habits, such as consuming salty snacks, and occasionally skipping her Lasix  dose when she has appointments. She generally takes Lasix  consistently, except for occasional delays. Her blood pressure readings at home range from 110s to 130s over 60s to 70s, with a low diastolic reading of 54 today. She takes her blood pressure at night before bed. She is on Lipitor for cholesterol management, omeprazole , and takes aspirin  every night. She takes Lasix  in the morning and potassium at night. She gets her medications from [facility], except for one medication from [facility] due to cost.  She has not had a mammogram since 2022 due to discomfort during the procedure and is uncertain about pursuing further screening. She has a family history of breast cancer, with her sister and cousin having had the disease.  She stays active within her house but has not been walking outside due to allergies and pollen. She hopes to resume walking for ten minutes outside soon. She engages in activities on her iPad, such as word puzzles and Mahjong, to stay mentally active. No numbness or tingling in her legs. She reports occasional feelings of being 'a little down' but  generally maintains a good mood.    Home BP readings 110s-130s/60s-70s ***  {History (Optional):23778}  Medications: Outpatient Medications Prior to Visit  Medication Sig   acetaminophen  (TYLENOL ) 650 MG CR tablet Take by mouth.   alendronate  (FOSAMAX ) 70 MG tablet Take 1 tablet (70 mg total) by mouth every 7 (seven) days. Take with a full glass of water on an empty stomach.   aspirin  81 MG chewable tablet Chew 81 mg by mouth daily.   atorvastatin  (LIPITOR) 40 MG tablet Take 1 tablet (40 mg total) by mouth daily at 6 PM.   azelastine  (ASTELIN ) 0.1 % nasal spray Place 1 spray into both nostrils 2 (two) times daily.   Carboxymethylcell-Hypromellose (GENTEAL) 0.25-0.3 % GEL Apply 1 application to eye at bedtime.    carvedilol  (COREG ) 12.5 MG tablet Take 12.5 mg by mouth 2 (two) times daily with a meal.    cetirizine  (ZYRTEC ) 10 MG tablet Take 1 tablet (10 mg total) by mouth at bedtime.   fluticasone  (FLONASE ) 50 MCG/ACT nasal spray Place 1 spray into both nostrils daily.    furosemide  (LASIX ) 40 MG tablet TAKE 1 TABLET BY MOUTH TWICE DAILY   hydrOXYzine  (VISTARIL ) 25 MG capsule Take 1 capsule (25 mg total) by mouth 3 (three) times daily as needed.   metolazone  (ZAROXOLYN ) 2.5 MG tablet Take 1 tablet (2.5 mg total) by mouth every other day as needed (Increased swelling, shortness of breath or over 2 pound weight gain).   Multiple Vitamins-Minerals (PRESERVISION AREDS PO) Take 1 tablet  by mouth 2 (two) times daily.    omeprazole  (PRILOSEC) 20 MG capsule TAKE 1 CAPSULE BY MOUTH ONCE DAILY   potassium chloride  (KLOR-CON ) 10 MEQ tablet TAKE 1 TABLET BY MOUTH TWICE DAILY   Probiotic Product (ALIGN) 4 MG CAPS Take 8 mg by mouth daily after lunch.   sacubitril -valsartan  (ENTRESTO ) 49-51 MG Take 1 tablet by mouth 2 (two) times daily.   senna-docusate (SENOKOT-S) 8.6-50 MG tablet Take 1 tablet by mouth 2 (two) times daily as needed for moderate constipation.   Vibegron  75 MG TABS Take 1 tablet (75  mg total) by mouth daily.   No facility-administered medications prior to visit.    Review of Systems ***  {Insert previous labs (optional):23779} {See past labs  Heme  Chem  Endocrine  Serology  Results Review (optional):1}   Objective    There were no vitals taken for this visit. {Insert last BP/Wt (optional):23777}{See vitals history (optional):1}   Physical Exam Constitutional:      Appearance: Normal appearance.  HENT:     Head: Normocephalic and atraumatic.  Eyes:     General: No scleral icterus.    Extraocular Movements: Extraocular movements intact.     Conjunctiva/sclera: Conjunctivae normal.  Cardiovascular:     Rate and Rhythm: Normal rate and regular rhythm.     Pulses: Normal pulses.     Heart sounds: Normal heart sounds.  Pulmonary:     Effort: Pulmonary effort is normal. No respiratory distress.     Breath sounds: Normal breath sounds.  Musculoskeletal:     Right lower leg: No edema.     Left lower leg: No edema.  Skin:    General: Skin is warm and dry.  Neurological:     Mental Status: She is alert and oriented to person, place, and time. Mental status is at baseline.  Psychiatric:        Mood and Affect: Mood normal.        Behavior: Behavior normal.      No results found for any visits on 11/17/23.  Assessment & Plan    There are no diagnoses linked to this encounter.    Hypertension Blood pressure well-controlled at home; in-office reading likely affected by cuff size. - Advise annual accuracy check with her blood pressure cuff.  Heart failure Weight gain and occasional Lasix  noncompliance noted; no significant edema. - Encourage consistent Lasix  use. - Monitor weight and report significant changes.  Hyperlipidemia Cholesterol well-managed on Lipitor; LDL last measured at 35. - Order blood work for cholesterol levels.  Shingles vaccination First dose received; clarified second dose needed without restarting series. Discussed  potential side effects of simultaneous vaccination. - Prescribe second shingles vaccine. - Advise waiting after eye shots before shingles vaccine.  Tetanus vaccination No tetanus vaccine in last ten years; advised pharmacy administration due to Medicare restrictions. - Prescribe tetanus vaccine. - Advise pharmacy administration.  Breast cancer screening Mammogram declined due to discomfort and treatment uncertainty; discussed early detection benefits and family history. Teresa Moyer mammogram as declined; revisit next visit.  Follow-up Advised six-month follow-up; blood work planned today. - Schedule follow-up in six months. - Perform blood work today. - Advise return for cholesterol testing if not done today.  NOT FASTING  ***  No follow-ups on file.      I discussed the assessment and treatment plan with the patient  The patient was provided an opportunity to ask questions and all were answered. The patient agreed with the plan and demonstrated  an understanding of the instructions.   The patient was advised to call back or seek an in-person evaluation if the symptoms worsen or if the condition fails to improve as anticipated.    Carlean Charter, DO  [facility] [phone] (phone) [phone] (fax)  [facility]

## 2023-11-17 NOTE — Patient Instructions (Signed)
 Recommended vaccines: Tdap (tetanus, diphtheria and pertussis (whooping cough)) and Shingrix (shingles),, which you can obtain at your pharmacy.

## 2023-11-18 LAB — COMPREHENSIVE METABOLIC PANEL WITH GFR
ALT: 14 IU/L (ref 0–32)
AST: 17 IU/L (ref 0–40)
Albumin: 4.3 g/dL (ref 3.7–4.7)
Alkaline Phosphatase: 76 IU/L (ref 44–121)
BUN/Creatinine Ratio: 19 (ref 12–28)
BUN: 14 mg/dL (ref 8–27)
Bilirubin Total: 0.7 mg/dL (ref 0.0–1.2)
CO2: 21 mmol/L (ref 20–29)
Calcium: 9 mg/dL (ref 8.7–10.3)
Chloride: 101 mmol/L (ref 96–106)
Creatinine, Ser: 0.73 mg/dL (ref 0.57–1.00)
Globulin, Total: 2.1 g/dL (ref 1.5–4.5)
Glucose: 127 mg/dL — ABNORMAL HIGH (ref 70–99)
Potassium: 4.5 mmol/L (ref 3.5–5.2)
Sodium: 137 mmol/L (ref 134–144)
Total Protein: 6.4 g/dL (ref 6.0–8.5)
eGFR: 80 mL/min/{1.73_m2} (ref >59)

## 2023-11-18 LAB — LIPID PANEL
Chol/HDL Ratio: 1.9 ratio (ref 0.0–4.4)
Cholesterol, Total: 114 mg/dL (ref 100–199)
HDL: 61 mg/dL (ref >39)
LDL Chol Calc (NIH): 24 mg/dL (ref 0–99)
Triglycerides: 187 mg/dL — ABNORMAL HIGH (ref 0–149)
VLDL Cholesterol Cal: 29 mg/dL (ref 5–40)

## 2023-11-18 LAB — VITAMIN B12: Vitamin B-12: 1310 pg/mL — ABNORMAL HIGH (ref 232–1245)

## 2023-11-18 LAB — TSH RFX ON ABNORMAL TO FREE T4: TSH: 1.87 u[IU]/mL (ref 0.450–4.500)

## 2023-11-18 LAB — HEMOGLOBIN A1C
Est. average glucose Bld gHb Est-mCnc: 123 mg/dL
Hgb A1c MFr Bld: 5.9 % — ABNORMAL HIGH (ref 4.8–5.6)

## 2023-11-19 ENCOUNTER — Ambulatory Visit: Payer: Self-pay | Admitting: Family Medicine

## 2023-11-27 ENCOUNTER — Other Ambulatory Visit: Payer: Self-pay | Admitting: Family Medicine

## 2023-11-29 ENCOUNTER — Telehealth: Payer: Self-pay | Admitting: Family Medicine

## 2023-11-29 DIAGNOSIS — M810 Age-related osteoporosis without current pathological fracture: Secondary | ICD-10-CM

## 2023-11-29 NOTE — Telephone Encounter (Signed)
[facility] pharmacy faxed refill request for the following medications:    alendronate (FOSAMAX) 70 MG tablet    Please advise  

## 2023-11-30 ENCOUNTER — Other Ambulatory Visit: Payer: Self-pay

## 2023-11-30 ENCOUNTER — Telehealth: Payer: Self-pay | Admitting: Family Medicine

## 2023-11-30 NOTE — Telephone Encounter (Signed)
 [facility] is requesting refill alendronate  (FOSAMAX ) 70 MG tablet  Please advise

## 2023-11-30 NOTE — Telephone Encounter (Signed)
I think this is a duplicate

## 2023-11-30 NOTE — Telephone Encounter (Signed)
Converted to refill request 

## 2023-12-01 MED ORDER — ALENDRONATE SODIUM 70 MG PO TABS: 70.0000 mg | ORAL_TABLET | ORAL | 0 refills | Status: DC

## 2023-12-01 NOTE — Telephone Encounter (Signed)
 Copied from CRM [id number]. Topic: Clinical - Prescription Issue >> Dec 01, 2023  1:52 PM Santiya F wrote: Reason for CRM: [facility] is calling in because they requested a refill for patient's medication alendronate  (FOSAMAX ) 70 MG tablet [Pharmacy and it was denied because it was requested too soon. [facility] says the prescription was last filled in March and it's now due. Please follow up with [facility].

## 2023-12-08 DIAGNOSIS — M810 Age-related osteoporosis without current pathological fracture: Secondary | ICD-10-CM | POA: Insufficient documentation

## 2023-12-08 NOTE — Telephone Encounter (Signed)
 Pt has been notified, pt verbalized understanding.

## 2023-12-08 NOTE — Addendum Note (Signed)
 Addended by: DONZELLA DOMINO on: 12/08/2023 12:37 PM   Modules accepted: Orders

## 2023-12-22 ENCOUNTER — Ambulatory Visit: Payer: Medicare Other

## 2023-12-22 DIAGNOSIS — Z780 Asymptomatic menopausal state: Secondary | ICD-10-CM | POA: Diagnosis not present

## 2023-12-22 DIAGNOSIS — Z0000 Encounter for general adult medical examination without abnormal findings: Secondary | ICD-10-CM

## 2023-12-22 NOTE — Patient Instructions (Addendum)
 Teresa Moyer , Thank you for taking time out of your busy schedule to complete your Annual Wellness Visit with me. I enjoyed our conversation and look forward to speaking with you again next year. I, as well as your care team,  appreciate your ongoing commitment to your health goals. Please review the following plan we discussed and let me know if I can assist you in the future.  REFERRAL SENT FOR BDS AT [facility] IN [place]  You have an order for:  []   2D Mammogram  []   3D Mammogram  [x]   Bone Density     Please call for appointment:  [facility]  [address] [place] [address] [phone] [facility] [address] [place], [place] [address] [phone] [facility] [address] [place], [place] [address] [phone]   Make sure to wear two-piece clothing.  No lotions, powders, or deodorants the day of the appointment. Make sure to bring picture ID and insurance card.  Bring list of medications you are currently taking including any supplements.   Schedule your [place] screening mammogram through MyChart!   Log into your MyChart account.  Go to 'Visit' (or 'Appointments' if on mobile App) --> Schedule an Appointment  Under 'Select a Reason for Visit' choose the Mammogram Screening option.  Complete the pre-visit questions and select the time and place that best fits your schedule.   Follow up Visits: Next Medicare AWV with our clinical staff:   12/26/24 @ 2:30 PM BY PHONE Have you seen your provider in the last 6 months (3 months if uncontrolled diabetes)? Yes   Clinician Recommendations:  Aim for 30 minutes of exercise or brisk walking, 6-8 glasses of water, and 5 servings of fruits and vegetables each day. TAKE CARE!      This is a list of the screening recommended for you and due dates:  Health Maintenance  Topic Date Due    DTaP/Tdap/Td vaccine (1 - Tdap) Never done   Zoster (Shingles) Vaccine (1 of 2) 10[birth year]1956   Mammogram  01/14/2024*   COVID-19 Vaccine (4 - 2024-25 season) 02/14/2024*   Flu Shot  01/14/2024   Medicare Annual Wellness Visit  12/21/2024   Pneumococcal Vaccine for age over 37  Completed   DEXA scan (bone density measurement)  Completed   Hepatitis B Vaccine  Aged Out   HPV Vaccine  Aged Out   Meningitis B Vaccine  Aged Out  *Topic was postponed. The date shown is not the original due date.    Advanced directives: (In Chart) A copy of your advanced directives are scanned into your chart should your provider ever need it. Advance Care Planning is important because it:  [x]  Makes sure you receive the medical care that is consistent with your values, goals, and preferences  [x]  It provides guidance to your family and loved ones and reduces their decisional burden about whether or not they are making the right decisions based on your wishes.  Follow the link provided in your after visit summary or read over the paperwork we have mailed to you to help you started getting your Advance Directives in place. If you need assistance in completing these, please reach out to us  so that we can help you!
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
## 2023-12-23 ENCOUNTER — Telehealth: Payer: Self-pay | Admitting: Family Medicine

## 2023-12-23 DIAGNOSIS — Z23 Encounter for immunization: Secondary | ICD-10-CM

## 2023-12-23 NOTE — Telephone Encounter (Signed)
 Copied from CRM [id number]. Topic: Clinical - Prescription Issue >> Dec 23, 2023 12:04 PM Willma R wrote: Reason for CRM: Randie from [facility] calling in regards to the prescription sent over for Shingrix ; Inject 0.5 mLs. States for billing reasons prescription needs to state quantity of 1 instead of 0.68mls as well as will need a refill as it requires 2 injections.  Randie can be reached at [phone]

## 2023-12-24 MED ORDER — SHINGRIX 50 MCG/0.5ML IM SUSR: 0.5000 mL | Freq: Once | INTRAMUSCULAR | 1 refills | Status: AC

## 2024-01-04 ENCOUNTER — Other Ambulatory Visit: Payer: Self-pay | Admitting: Family Medicine

## 2024-01-04 DIAGNOSIS — K219 Gastro-esophageal reflux disease without esophagitis: Secondary | ICD-10-CM

## 2024-02-08 ENCOUNTER — Telehealth: Payer: Self-pay | Admitting: Family Medicine

## 2024-02-08 MED ORDER — VITAMIN B-12 1000 MCG PO TABS: 500.0000 ug | ORAL_TABLET | Freq: Every day | ORAL | 2 refills | Status: AC

## 2024-02-08 NOTE — Telephone Encounter (Signed)
 [facility] faxed refill request for the following medications:  cyanocobalamin  (VITAMIN B12) 1000 MCG tablet    Please advise.

## 2024-02-16 ENCOUNTER — Telehealth: Payer: Self-pay

## 2024-02-16 NOTE — Telephone Encounter (Signed)
 Copied from CRM [id number]. Topic: Referral - Request for Referral >> Feb 16, 2024  2:02 PM Tiffini S wrote: Did the patient discuss referral with their provider in the last year? Yes (If No - schedule appointment) (If Yes - send message)  Appointment offered? Yes  Type of order/referral and detailed reason for visit: Bone Density Test   Preference of office, provider, location: [facility] [phone]  If referral order, have you been seen by this specialty before? Yes (If Yes, this issue or another issue? When? Where? 2 years ago  Can we respond through MyChart? No, please call the patient at [phone]

## 2024-02-21 ENCOUNTER — Other Ambulatory Visit: Payer: Self-pay | Admitting: Family Medicine

## 2024-02-21 NOTE — Telephone Encounter (Signed)
 LOV 11/17/23 NOV 05/24/24 LRF 12/01/23 LABS 11/17/23

## 2024-02-23 NOTE — Telephone Encounter (Unsigned)
 Copied from CRM [id number]. Topic: Clinical - Medication Question >> Feb 23, 2024  1:50 PM DeAngela L wrote: Reason for CRM: [facility] calling to check the status of medication refill for message was sent to the office 02/21/24 alendronate  (FOSAMAX ) 70 MG tablet  [facility] - [place], [place] - [address] [place] [address] Phone: [phone] Fax: [phone]

## 2024-02-24 NOTE — Telephone Encounter (Unsigned)
 Copied from CRM [id number]. Topic: Clinical - Medication Question >> Feb 23, 2024  1:50 PM DeAngela L wrote: Reason for CRM: [facility] calling to check the status of medication refill for message was sent to the office 02/21/24 alendronate  (FOSAMAX ) 70 MG tablet  [facility] - [place], [place] - [address] [place] [address] Phone: [phone] Fax: [phone] >> Feb 24, 2024 11:01 AM Treva T wrote: Received call from patient, checking status of medication refill, alendronate  (FOSAMAX ) 70 MG tablet.  Patient states pharmacy has requested refill several times, but no response.  Patient requesting a follow up to advise, can be reached at [phone].  [facility] - [place], [place] - [address] [place] [address] Phone: [phone] Fax: [phone]

## 2024-03-20 ENCOUNTER — Other Ambulatory Visit: Payer: Self-pay | Admitting: Family Medicine

## 2024-03-20 DIAGNOSIS — I5043 Acute on chronic combined systolic (congestive) and diastolic (congestive) heart failure: Secondary | ICD-10-CM

## 2024-03-28 ENCOUNTER — Ambulatory Visit (INDEPENDENT_AMBULATORY_CARE_PROVIDER_SITE_OTHER)

## 2024-03-28 DIAGNOSIS — Z23 Encounter for immunization: Secondary | ICD-10-CM

## 2024-04-04 LAB — HM DEXA SCAN

## 2024-04-05 ENCOUNTER — Encounter: Payer: Self-pay | Admitting: Family Medicine

## 2024-04-06 ENCOUNTER — Ambulatory Visit (INDEPENDENT_AMBULATORY_CARE_PROVIDER_SITE_OTHER): Admitting: Obstetrics and Gynecology

## 2024-04-06 ENCOUNTER — Encounter: Payer: Self-pay | Admitting: Obstetrics and Gynecology

## 2024-04-06 DIAGNOSIS — R350 Frequency of micturition: Secondary | ICD-10-CM | POA: Diagnosis not present

## 2024-04-06 DIAGNOSIS — N3281 Overactive bladder: Secondary | ICD-10-CM

## 2024-04-06 LAB — POCT URINALYSIS DIP (CLINITEK)
Bilirubin, UA: NEGATIVE
Blood, UA: NEGATIVE
Glucose, UA: NEGATIVE mg/dL
Ketones, POC UA: NEGATIVE mg/dL
Leukocytes, UA: NEGATIVE
Nitrite, UA: NEGATIVE
POC PROTEIN,UA: NEGATIVE
Spec Grav, UA: 1.01 (ref 1.010–1.025)
Urobilinogen, UA: 0.2 U/dL
pH, UA: 6 (ref 5.0–8.0)

## 2024-04-06 MED ORDER — VIBEGRON 75 MG PO TABS: 75.0000 mg | ORAL_TABLET | Freq: Every day | ORAL | 3 refills | Status: AC

## 2024-04-06 NOTE — Progress Notes (Signed)
 [facility] Return Visit  SUBJECTIVE  History of Present Illness: Teresa Moyer is a 88 y.o. female seen in follow-up for OAB. Plan at last visit was Continue Gemtesa  75mg  daily.   Patient reports she would like to check a urine sample today to rule out UTI.   Patient states she has been overall doing well   Past Medical History: Patient  has a past medical history of Cataracts, bilateral, CHF (congestive heart failure) (HCC) (02/2019), Coronary artery disease, Environmental allergies, Family history of adverse reaction to anesthesia, GERD (gastroesophageal reflux disease), Heart murmur (2019), Hemorrhoids, History of chickenpox, Hypertension, Incontinence in female, Macular degeneration, Myocardial infarction (HCC) (01/2019), Osteoarthritis, Skin cancer (12/2017/11-2019), and Type O blood, Rh negative (12/2017).   Past Surgical History: She  has a past surgical history that includes Cosmetic surgery (1946); Skin cancer excision; Abdominal hysterectomy; Tonsillectomy; Mohs surgery (08/2011); Pars plana vitrectomy w/ repair of macular hole; Colonoscopy with propofol  (N/A, 08/30/2015); cataract (Bilateral, 2009); Eye surgery (Bilateral, 2009); Eye surgery (2010); Eye surgery (2011); Joint replacement (Right, 06/01/2012); Cholecystectomy (N/A, 01/12/2018); Cholecystectomy, laparoscopic; Mohs surgery; Coronary/Graft Acute MI Revascularization (N/A, 12/27/2018); LEFT HEART CATH AND CORONARY ANGIOGRAPHY (N/A, 12/27/2018); Knee Arthroplasty (Left, 12/20/2019); Esophagogastroduodenoscopy (egd) with propofol  (N/A, 01/05/2020); and Flexible sigmoidoscopy (N/A, 07/23/2022).   Medications: She has a current medication list which includes the following prescription(s): acetaminophen , alendronate , aspirin , atorvastatin , azelastine , genteal, carvedilol , cetirizine , cyanocobalamin , fluticasone , furosemide , multiple vitamins-minerals, omeprazole , potassium chloride , align, entresto , senna-docusate, and  vibegron .   Allergies: Patient is allergic to accupril [quinapril hcl], sulfa antibiotics, clinoril [sulindac], hydrochlorothiazide, spironolactone, tessalon [benzonatate], amoxicillin, codeine sulfate, and penicillins.   Social History: Patient  reports that she has never smoked. She has never used smokeless tobacco. She reports that she does not drink alcohol  and does not use drugs.     OBJECTIVE     Physical Exam: Vitals:   04/06/24 1300  BP: (!) 151/74  Pulse: 64   Gen: No apparent distress, A&O x 3.  Detailed Urogynecologic Evaluation:  Deferred.  Lab Results  Component Value Date   COLORU yellow 04/06/2024   CLARITYU clear 04/06/2024   GLUCOSEUR negative 04/06/2024   BILIRUBINUR negative 04/06/2024   KETONESU negative 09/16/2023   SPECGRAV 1.010 04/06/2024   RBCUR negative 04/06/2024   PHUR 6.0 04/06/2024   PROTEINUR Negative 09/16/2023   UROBILINOGEN 0.2 04/06/2024   LEUKOCYTESUR Negative 04/06/2024      ASSESSMENT AND PLAN    Ms. Gorby is a 88 y.o. with:  1. Urinary frequency   2. Overactive bladder    Urine negative for infection.  Patient to continue on Gemetesa 75mg  daily. Encouraged patient to call if she has UTI symptoms and we would work to get her on  the nurse schedule for a sample.   Patient to return in 1 year or sooner if needed.    Quinlan Vollmer G Kyrene Longan, NP

## 2024-04-06 NOTE — Patient Instructions (Addendum)
 Consider trying the golden kiwi for your bowels. They are sweeter and good for bowel movements.   Continue the Gemtesa  75mg  daily. Please call if you have any concerns.

## 2024-04-11 ENCOUNTER — Telehealth: Payer: Self-pay

## 2024-04-11 NOTE — Telephone Encounter (Signed)
 Spoke with pt advised we have not gotten the released images nor has provider reviewed. Advised we will call once been signed off by PCP. Stated she went in last Tuesday and did the test.

## 2024-04-11 NOTE — Telephone Encounter (Signed)
 Copied from CRM #[id number]. Topic: Clinical - Lab/Test Results >> Apr 11, 2024  1:36 PM Larissa S wrote: Reason for CRM: Patient requesting a callback to discuss bone density test results.

## 2024-04-19 ENCOUNTER — Telehealth: Payer: Self-pay | Admitting: Family Medicine

## 2024-04-19 NOTE — Telephone Encounter (Unsigned)
 Copied from CRM [id number]. Topic: Clinical - Lab/Test Results >> Apr 19, 2024  3:06 PM Tiffini S wrote: Reason for CRM: Patient called asking to discuss bone density test results- said it has been two weeks   Please call the patient back at [phone]

## 2024-04-20 NOTE — Telephone Encounter (Signed)
 Called and informed patient of the provider's message regarding her bone density test.  She verbalized understanding.

## 2024-05-24 ENCOUNTER — Encounter: Admitting: Family Medicine

## 2024-05-31 ENCOUNTER — Encounter: Payer: Self-pay | Admitting: Family Medicine

## 2024-05-31 ENCOUNTER — Ambulatory Visit: Admitting: Family Medicine

## 2024-05-31 VITALS — BP 153/63 | HR 59 | Temp 98.3°F | Ht 59.0 in | Wt 148.6 lb

## 2024-05-31 DIAGNOSIS — M810 Age-related osteoporosis without current pathological fracture: Secondary | ICD-10-CM

## 2024-05-31 DIAGNOSIS — I5022 Chronic systolic (congestive) heart failure: Secondary | ICD-10-CM

## 2024-05-31 DIAGNOSIS — K219 Gastro-esophageal reflux disease without esophagitis: Secondary | ICD-10-CM

## 2024-05-31 DIAGNOSIS — I10 Essential (primary) hypertension: Secondary | ICD-10-CM | POA: Diagnosis not present

## 2024-05-31 DIAGNOSIS — R7989 Other specified abnormal findings of blood chemistry: Secondary | ICD-10-CM

## 2024-05-31 DIAGNOSIS — N182 Chronic kidney disease, stage 2 (mild): Secondary | ICD-10-CM | POA: Diagnosis not present

## 2024-05-31 DIAGNOSIS — Z0000 Encounter for general adult medical examination without abnormal findings: Secondary | ICD-10-CM | POA: Diagnosis not present

## 2024-05-31 DIAGNOSIS — I252 Old myocardial infarction: Secondary | ICD-10-CM | POA: Diagnosis not present

## 2024-05-31 DIAGNOSIS — R7303 Prediabetes: Secondary | ICD-10-CM | POA: Diagnosis not present

## 2024-05-31 DIAGNOSIS — E782 Mixed hyperlipidemia: Secondary | ICD-10-CM | POA: Diagnosis not present

## 2024-05-31 NOTE — Progress Notes (Signed)
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 hospitalization: No Has patient had a PCN reaction occurring within the last 10 years:Possibly unsure  If all of the above answers are NO, then may proceed with Cephalosporin use.    Codeine Sulfate Nausea And Vomiting   Penicillins Rash    Has patient had a PCN reaction causing immediate rash, facial/tongue/throat swelling, SOB or lightheadedness with hypotension: Unknown Has patient had a PCN reaction causing severe rash involving mucus membranes or skin necrosis: Unknown Has patient had a PCN reaction that required hospitalization: No Has patient had a PCN reaction occurring within the last 10 years:Possibly unsure  If all of the above answers are NO, then may proceed with Cephalosporin use.  [2]  Outpatient Medications Prior to Visit  Medication Sig   acetaminophen  (TYLENOL ) 650  MG CR tablet Take by mouth.   alendronate  (FOSAMAX ) 70 MG tablet TAKE ONE TABLET BY MOUTH EACH WEEK, ON AN EMPTY STOMACH BEFORE BREAKFAST WITH 8oz OF WATER AND REMAIN UPRIGHT FOR :30   aspirin  81 MG chewable tablet Chew 81 mg by mouth daily.   atorvastatin  (LIPITOR) 40 MG tablet Take 1 tablet (40 mg total) by mouth daily at 6 PM.   azelastine  (ASTELIN ) 0.1 % nasal spray Place 1 spray into both nostrils 2 (two) times daily.   calcium  carbonate (OSCAL) 1500 (600 Ca) MG TABS tablet Take 1,200 mg by mouth daily.   Carboxymethylcell-Hypromellose (  GENTEAL) 0.25-0.3 % GEL Apply 1 application to eye at bedtime.    carvedilol  (COREG ) 12.5 MG tablet Take 12.5 mg by mouth 2 (two) times daily with a meal.    cetirizine  (ZYRTEC ) 10 MG tablet Take 1 tablet (10 mg total) by mouth at bedtime.   cholecalciferol (VITAMIN D3) 25 MCG (1000 UNIT) tablet Take 1,000 Units by mouth daily.   cyanocobalamin  (VITAMIN B12) 1000 MCG tablet Take 0.5 tablets (500 mcg total) by mouth daily.   fluticasone  (FLONASE ) 50 MCG/ACT nasal spray Place 1 spray into both nostrils daily.    furosemide  (LASIX ) 40 MG tablet TAKE 1 TABLET BY MOUTH TWICE DAILY   Multiple Vitamins-Minerals (PRESERVISION AREDS PO) Take 1 tablet by mouth 2 (two) times daily.    omeprazole  (PRILOSEC) 20 MG capsule TAKE 1 CAPSULE BY MOUTH ONCE DAILY   potassium chloride  (KLOR-CON ) 10 MEQ tablet TAKE 1 TABLET BY MOUTH TWICE DAILY   Probiotic Product (ALIGN) 4 MG CAPS Take 8 mg by mouth daily after lunch.   sacubitril -valsartan  (ENTRESTO ) 49-51 MG Take 1 tablet by mouth 2 (two) times daily.   senna-docusate (SENOKOT-S) 8.6-50 MG tablet Take 1 tablet by mouth 2 (two) times daily as needed for moderate constipation.   Vibegron  75 MG TABS Take 1 tablet (75 mg total) by mouth daily.   No facility-administered medications prior to visit.   "

## 2024-06-01 LAB — LIPID PANEL
Chol/HDL Ratio: 2.1 ratio (ref 0.0–4.4)
Cholesterol, Total: 124 mg/dL (ref 100–199)
HDL: 60 mg/dL (ref >39)
LDL Chol Calc (NIH): 35 mg/dL (ref 0–99)
Triglycerides: 184 mg/dL — ABNORMAL HIGH (ref 0–149)
VLDL Cholesterol Cal: 29 mg/dL (ref 5–40)

## 2024-06-01 LAB — HEMOGLOBIN A1C
Est. average glucose Bld gHb Est-mCnc: 126 mg/dL
Hgb A1c MFr Bld: 6 % — ABNORMAL HIGH (ref 4.8–5.6)

## 2024-06-01 LAB — MICROALBUMIN / CREATININE URINE RATIO
Creatinine, Urine: 23.6 mg/dL
Microalb/Creat Ratio: <13 mg/g{creat} (ref 0–29)
Microalbumin, Urine: <3 ug/mL

## 2024-06-01 LAB — COMPREHENSIVE METABOLIC PANEL WITH GFR
ALT: 15 IU/L (ref 0–32)
AST: 20 IU/L (ref 0–40)
Albumin: 4.4 g/dL (ref 3.7–4.7)
Alkaline Phosphatase: 78 IU/L (ref 48–129)
BUN/Creatinine Ratio: 18 (ref 12–28)
BUN: 17 mg/dL (ref 8–27)
Bilirubin Total: 0.7 mg/dL (ref 0.0–1.2)
CO2: 22 mmol/L (ref 20–29)
Calcium: 9.4 mg/dL (ref 8.7–10.3)
Chloride: 99 mmol/L (ref 96–106)
Creatinine, Ser: 0.96 mg/dL (ref 0.57–1.00)
Globulin, Total: 2.2 g/dL (ref 1.5–4.5)
Glucose: 97 mg/dL (ref 70–99)
Potassium: 4.1 mmol/L (ref 3.5–5.2)
Sodium: 137 mmol/L (ref 134–144)
Total Protein: 6.6 g/dL (ref 6.0–8.5)
eGFR: 58 mL/min/1.73 — ABNORMAL LOW (ref >59)

## 2024-06-01 LAB — TSH RFX ON ABNORMAL TO FREE T4: TSH: 1.88 u[IU]/mL (ref 0.450–4.500)

## 2024-06-01 LAB — VITAMIN D 25 HYDROXY (VIT D DEFICIENCY, FRACTURES): Vit D, 25-Hydroxy: 32.4 ng/mL (ref 30.0–100.0)

## 2024-06-01 LAB — VITAMIN B12: Vitamin B-12: 1135 pg/mL (ref 232–1245)

## 2024-06-16 ENCOUNTER — Ambulatory Visit: Payer: Self-pay | Admitting: Family Medicine

## 2024-06-16 ENCOUNTER — Encounter: Payer: Self-pay | Admitting: Family Medicine

## 2024-06-16 DIAGNOSIS — I252 Old myocardial infarction: Secondary | ICD-10-CM | POA: Insufficient documentation

## 2024-07-12 ENCOUNTER — Ambulatory Visit: Payer: Self-pay

## 2024-07-12 NOTE — Telephone Encounter (Signed)
" °  FYI Only or Action Required?: FYI only for provider: appointment scheduled on 2/4.  Patient was last seen in primary care on 05/31/2024 by Donzella Lauraine SAILOR, DO.  Called Nurse Triage reporting Nasal Congestion.  Symptoms began several weeks ago.  Interventions attempted: Prescription medications: Antibiotics.  Symptoms are: stable.  Triage Disposition: See PCP When Office is Open (Within 3 Days)  Patient/caregiver understands and will follow disposition?: Yes  Summary: sinus   Reason for Triage: Pt called with a sinus inf. She went to walk in clinic and has taken 2 weeks of antibiotics and she still feels like its still there. At night its hard to breath, stopped up and occasional coughing.         Reason for Disposition  [1] Sinus congestion (pressure, fullness) AND [2] present > 10 days  Answer Assessment - Initial Assessment Questions Only wants to be seen at [facility].  1. ONSET: When did the nasal discharge start?      Few weeks ago 2. AMOUNT: How much discharge is there?      mild  4. RESPIRATORY DISTRESS: Describe your breathing.      Denies sob, only has nasal congestion 5. FEVER: Do you have a fever? If Yes, ask: What is your temperature, how was it measured, and when did it start?     denies  7. OTHER SYMPTOMS: Do you have any other symptoms? (e.g., earache, mouth sores, sore throat, wheezing)     denies  Protocols used: Common Cold-A-AH  "

## 2024-07-19 ENCOUNTER — Ambulatory Visit: Admitting: Family Medicine

## 2024-07-19 ENCOUNTER — Encounter: Payer: Self-pay | Admitting: Family Medicine

## 2024-07-19 VITALS — BP 156/60 | HR 70 | Resp 16 | Ht 64.0 in | Wt 144.0 lb

## 2024-07-19 DIAGNOSIS — R7303 Prediabetes: Secondary | ICD-10-CM

## 2024-07-19 DIAGNOSIS — K219 Gastro-esophageal reflux disease without esophagitis: Secondary | ICD-10-CM

## 2024-07-19 DIAGNOSIS — E538 Deficiency of other specified B group vitamins: Secondary | ICD-10-CM

## 2024-07-19 DIAGNOSIS — M810 Age-related osteoporosis without current pathological fracture: Secondary | ICD-10-CM

## 2024-07-19 DIAGNOSIS — R7989 Other specified abnormal findings of blood chemistry: Secondary | ICD-10-CM

## 2024-07-19 DIAGNOSIS — E782 Mixed hyperlipidemia: Secondary | ICD-10-CM

## 2024-07-19 DIAGNOSIS — J0110 Acute frontal sinusitis, unspecified: Secondary | ICD-10-CM | POA: Diagnosis not present

## 2024-07-19 DIAGNOSIS — I5022 Chronic systolic (congestive) heart failure: Secondary | ICD-10-CM

## 2024-07-19 DIAGNOSIS — I10 Essential (primary) hypertension: Secondary | ICD-10-CM

## 2024-07-19 MED ORDER — CEFDINIR 300 MG PO CAPS: 600.0000 mg | ORAL_CAPSULE | Freq: Every day | ORAL | 0 refills | Status: AC

## 2024-07-19 NOTE — Progress Notes (Signed)
 "     Established patient visit   Patient: Teresa Moyer   DOB: January 23, 1936   89 y.o. Female  MRN: [id number] Visit Date: 07/19/2024  Today's healthcare provider: Nancyann Perry, MD   Chief Complaint  Patient presents with   Acute Visit   Sinusitis    Nasal congestion ( 1 month)    Subjective    Discussed the use of AI scribe software for clinical note transcription with the patient, who gave verbal consent to proceed.  History of Present Illness   Teresa Moyer is an 89 year old female who presents with persistent sinus congestion and difficulty breathing.  She has been experiencing sinus congestion and difficulty breathing for about a month. Initially, she felt stuffed up and had trouble breathing, which led her to visit a walk-in clinic on June 27, 2024. She was diagnosed with a sinus infection and fluid in her ears and was prescribed doxycycline , which she took for a week. Although she noticed some improvement, the symptoms did not fully resolve.  The congestion initially was higher up but later settled in her sinuses, causing soreness, particularly on the right side. The congestion has been severe enough to wake her up at night, requiring her to sit up and blow her nose to breathe better. She also experienced dried blood in her nose, indicating dryness.  She has a history of using nasal sprays for allergies, including Flonase , which she continues to use. She stopped using Azelate at night due to excessive dryness. Additionally, she uses saline sprays such as Afrin saline, Mucinex saline, and AYR to aid breathing.  The patient reports no chest symptoms or cough. She attributes her issues primarily to her sinuses and ears. She recalls a day when she felt an earache coming on, which was alleviated with heat application. She mentions a history of fluid in her ears, as noted during her clinic visit.  She is allergic to several medications, including amoxicillin and sulfa  drugs, which she discovered in childhood. She recalls swelling and breathing difficulties when exposed to sulfa drugs in the second grade.  Her vision has been affected, as she usually experiences improvement a week after receiving eye shots for macular degeneration, but this has not occurred, which she attributes to the ongoing sinus infection.       Medications: Show/hide medication list[1] Review of Systems     Objective    BP (!) 156/60 (BP Location: Left Arm, Patient Position: Sitting, Cuff Size: Normal)   Pulse 70   Resp 16   Ht 5' 4 (1.626 m)   Wt 144 lb (65.3 kg)   SpO2 98%   BMI 24.72 kg/m   Physical Exam  General Appearance:    Well developed, well nourished female, alert, cooperative, in no acute distress  HENT:   bilateral TM normal without fluid or infection, neck without nodes, frontal sinuses tender, and nasal mucosa congested  Eyes:    PERRL, conjunctiva/corneas clear, EOM's intact       Lungs:     Clear to auscultation bilaterally, respirations unlabored  Heart:    Normal heart rate. Normal rhythm.  2/6 harsh, crescendo-decrescendo, systolic murmur at right upper sternal border   Neurologic:   Awake, alert, oriented x 3. No apparent focal neurological           defect.        Assessment & Plan        Acute frontal sinusitis Persistent symptoms with partial relief  from doxycycline  suggest bacterial sinusitis. Chose cefdinir  due to previous tolerance and effectiveness. - Prescribed cefdinir . - Continue Flonase . - Continue nasal irrigation with saline sprays. - Sent prescription to [facility].         Nancyann Perry, MD  [facility] [phone] (phone) [phone] (fax)  [facility]    [1]  Outpatient Medications Prior to Visit  Medication Sig   acetaminophen  (TYLENOL ) 650 MG CR tablet Take by mouth.   alendronate  (FOSAMAX ) 70 MG tablet TAKE ONE TABLET BY MOUTH EACH WEEK, ON AN EMPTY STOMACH BEFORE  BREAKFAST WITH 8oz OF WATER AND REMAIN UPRIGHT FOR :30   aspirin  81 MG chewable tablet Chew 81 mg by mouth daily.   atorvastatin  (LIPITOR) 40 MG tablet Take 1 tablet (40 mg total) by mouth daily at 6 PM.   azelastine  (ASTELIN ) 0.1 % nasal spray Place 1 spray into both nostrils 2 (two) times daily.   calcium  carbonate (OSCAL) 1500 (600 Ca) MG TABS tablet Take 1,200 mg by mouth daily.   Carboxymethylcell-Hypromellose (GENTEAL) 0.25-0.3 % GEL Apply 1 application to eye at bedtime.    carvedilol  (COREG ) 12.5 MG tablet Take 12.5 mg by mouth 2 (two) times daily with a meal.    cetirizine  (ZYRTEC ) 10 MG tablet Take 1 tablet (10 mg total) by mouth at bedtime.   cholecalciferol (VITAMIN D3) 25 MCG (1000 UNIT) tablet Take 1,000 Units by mouth daily.   cyanocobalamin  (VITAMIN B12) 1000 MCG tablet Take 0.5 tablets (500 mcg total) by mouth daily.   doxycycline  (VIBRAMYCIN ) 100 MG capsule Take 100 mg by mouth 2 (two) times daily.   fluticasone  (FLONASE ) 50 MCG/ACT nasal spray Place 1 spray into both nostrils daily.    furosemide  (LASIX ) 40 MG tablet TAKE 1 TABLET BY MOUTH TWICE DAILY   Multiple Vitamins-Minerals (PRESERVISION AREDS PO) Take 1 tablet by mouth 2 (two) times daily.    omeprazole  (PRILOSEC) 20 MG capsule TAKE 1 CAPSULE BY MOUTH ONCE DAILY   potassium chloride  (KLOR-CON ) 10 MEQ tablet TAKE 1 TABLET BY MOUTH TWICE DAILY   Probiotic Product (ALIGN) 4 MG CAPS Take 8 mg by mouth daily after lunch.   sacubitril -valsartan  (ENTRESTO ) 49-51 MG Take 1 tablet by mouth 2 (two) times daily.   senna-docusate (SENOKOT-S) 8.6-50 MG tablet Take 1 tablet by mouth 2 (two) times daily as needed for moderate constipation.   Vibegron  75 MG TABS Take 1 tablet (75 mg total) by mouth daily.   No facility-administered medications prior to visit.   "

## 2024-11-29 ENCOUNTER — Encounter

## 2024-11-29 ENCOUNTER — Encounter: Admitting: Family Medicine

## 2024-12-26 ENCOUNTER — Ambulatory Visit
